# Patient Record
Sex: Female | Born: 1955 | Race: Black or African American | Hispanic: No | Marital: Single | Smoking: Former smoker
Health system: Southern US, Community
[De-identification: ages and names within clinical notes are randomized; demographics above are authoritative.]

## PROBLEM LIST (undated history)

## (undated) DIAGNOSIS — I639 Cerebral infarction, unspecified: Secondary | ICD-10-CM

## (undated) DIAGNOSIS — Z973 Presence of spectacles and contact lenses: Secondary | ICD-10-CM

## (undated) DIAGNOSIS — K439 Ventral hernia without obstruction or gangrene: Secondary | ICD-10-CM

## (undated) DIAGNOSIS — E7800 Pure hypercholesterolemia, unspecified: Secondary | ICD-10-CM

## (undated) DIAGNOSIS — J45909 Unspecified asthma, uncomplicated: Secondary | ICD-10-CM

## (undated) DIAGNOSIS — I10 Essential (primary) hypertension: Secondary | ICD-10-CM

## (undated) HISTORY — PX: TUBAL LIGATION: SHX77

## (undated) HISTORY — PX: KNEE SURGERY: SHX244

## (undated) HISTORY — PX: ABDOMINAL HYSTERECTOMY: SHX81

## (undated) HISTORY — PX: LEG SURGERY: SHX1003

## (undated) HISTORY — DX: Cerebral infarction, unspecified: I63.9

---

## 1999-01-22 ENCOUNTER — Emergency Department (HOSPITAL_COMMUNITY): Admission: EM | Admit: 1999-01-22 | Discharge: 1999-01-22 | Payer: Self-pay | Admitting: *Deleted

## 1999-02-18 ENCOUNTER — Emergency Department (HOSPITAL_COMMUNITY): Admission: EM | Admit: 1999-02-18 | Discharge: 1999-02-18 | Payer: Self-pay | Admitting: Emergency Medicine

## 1999-02-18 ENCOUNTER — Encounter: Payer: Self-pay | Admitting: Emergency Medicine

## 2000-08-24 ENCOUNTER — Emergency Department (HOSPITAL_COMMUNITY): Admission: EM | Admit: 2000-08-24 | Discharge: 2000-08-25 | Payer: Self-pay | Admitting: Emergency Medicine

## 2001-05-12 ENCOUNTER — Emergency Department (HOSPITAL_COMMUNITY): Admission: EM | Admit: 2001-05-12 | Discharge: 2001-05-12 | Payer: Self-pay

## 2001-05-12 ENCOUNTER — Encounter: Payer: Self-pay | Admitting: Specialist

## 2002-05-01 ENCOUNTER — Emergency Department (HOSPITAL_COMMUNITY): Admission: EM | Admit: 2002-05-01 | Discharge: 2002-05-01 | Payer: Self-pay | Admitting: Emergency Medicine

## 2002-05-01 ENCOUNTER — Encounter: Payer: Self-pay | Admitting: Emergency Medicine

## 2004-06-07 ENCOUNTER — Other Ambulatory Visit: Admission: RE | Admit: 2004-06-07 | Discharge: 2004-06-07 | Payer: Self-pay | Admitting: Family Medicine

## 2006-01-08 ENCOUNTER — Emergency Department (HOSPITAL_COMMUNITY): Admission: EM | Admit: 2006-01-08 | Discharge: 2006-01-08 | Payer: Self-pay | Admitting: Emergency Medicine

## 2006-12-09 ENCOUNTER — Emergency Department (HOSPITAL_COMMUNITY): Admission: EM | Admit: 2006-12-09 | Discharge: 2006-12-09 | Payer: Self-pay | Admitting: Emergency Medicine

## 2009-06-02 ENCOUNTER — Emergency Department (HOSPITAL_COMMUNITY): Admission: EM | Admit: 2009-06-02 | Discharge: 2009-06-02 | Payer: Self-pay | Admitting: Emergency Medicine

## 2009-06-02 IMAGING — CR DG KNEE COMPLETE 4+V*L*
4 series · 4 of 4 positions shown · non-contrast
Comparison: None

CLINICAL DATA: Fell - anterior knee pain

LEFT KNEE - COMPLETE 4+ VIEW

[t knee ap left]
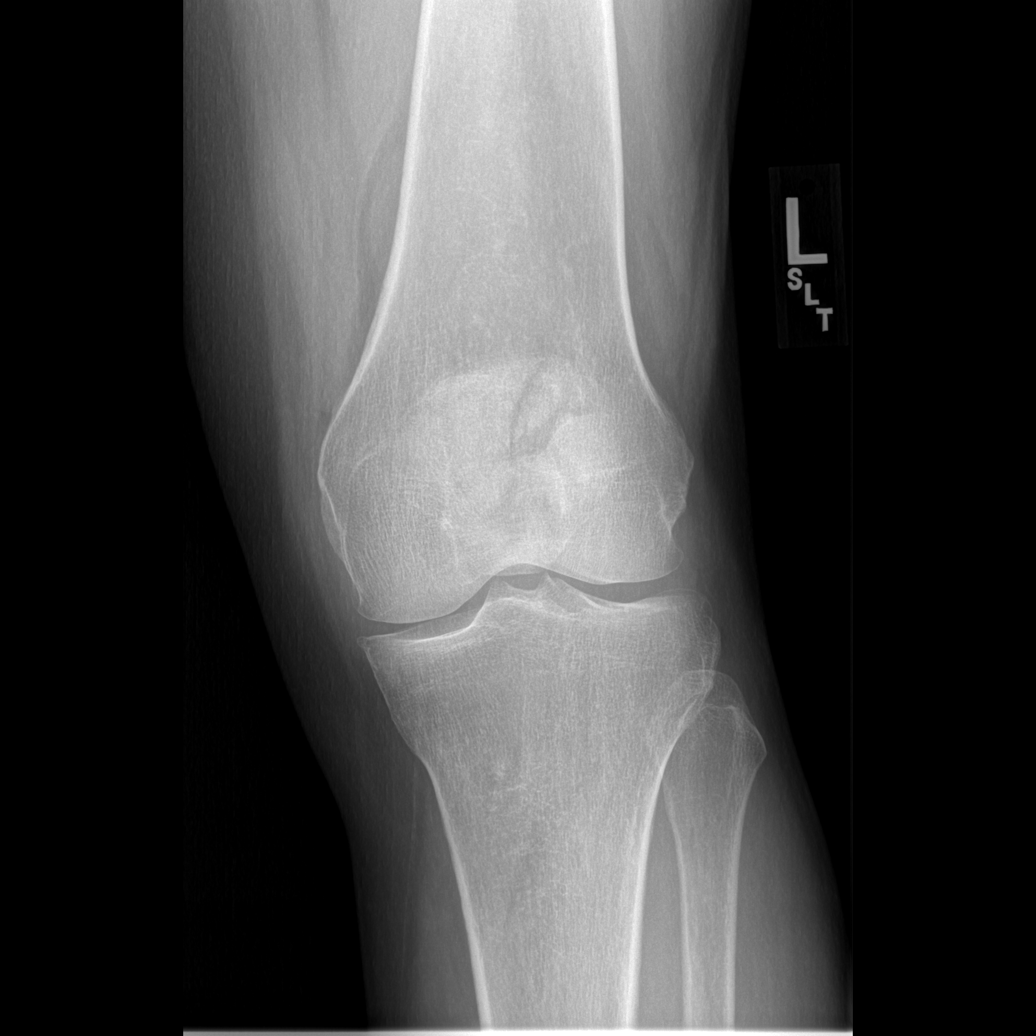

[t knee oblique left (1 of 2)]
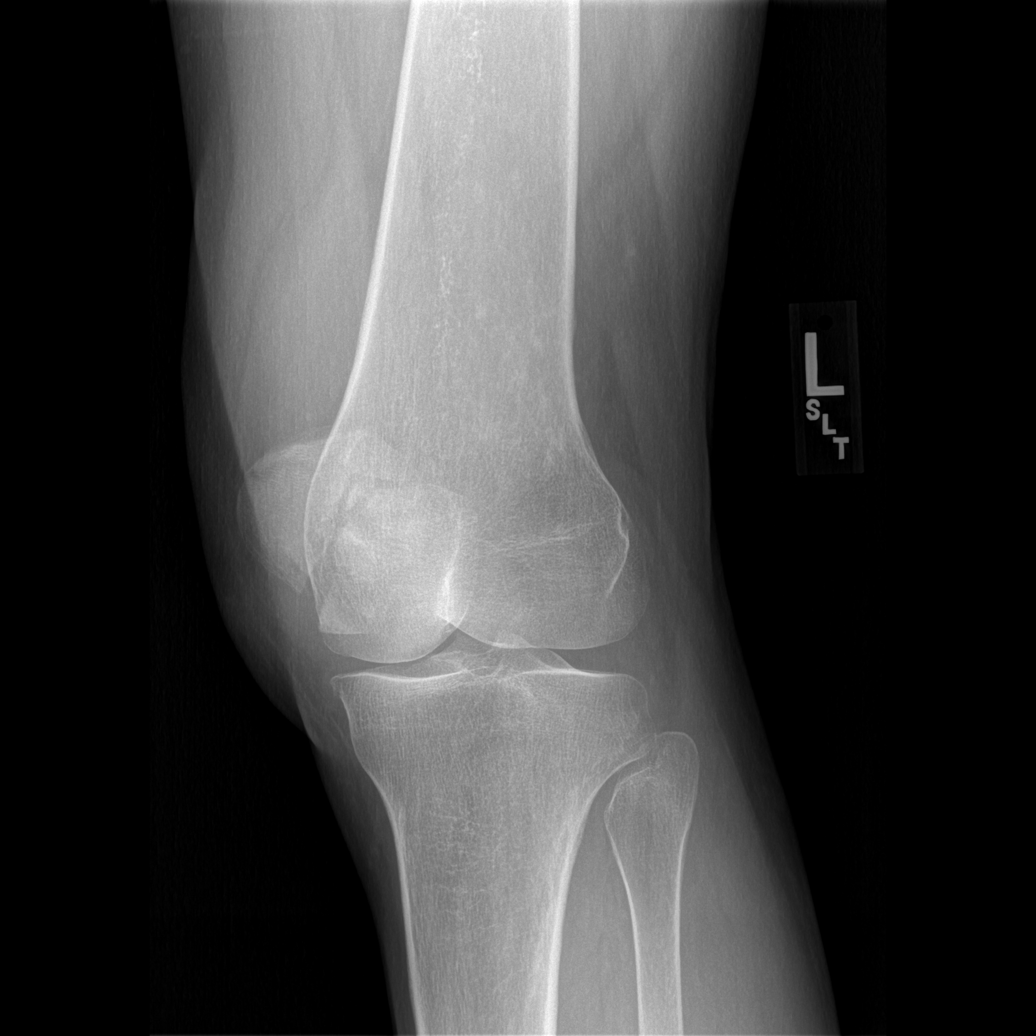

[t knee oblique left (2 of 2)]
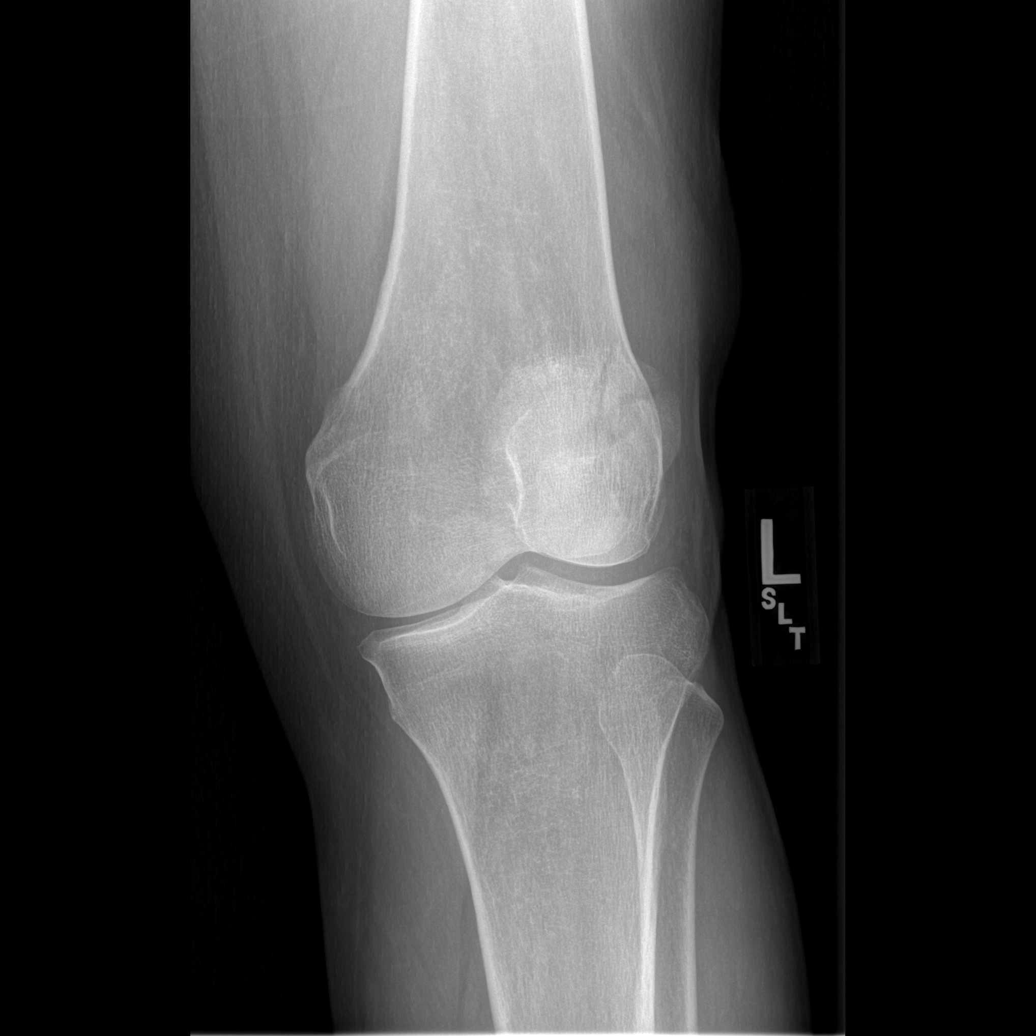

[view not recorded]
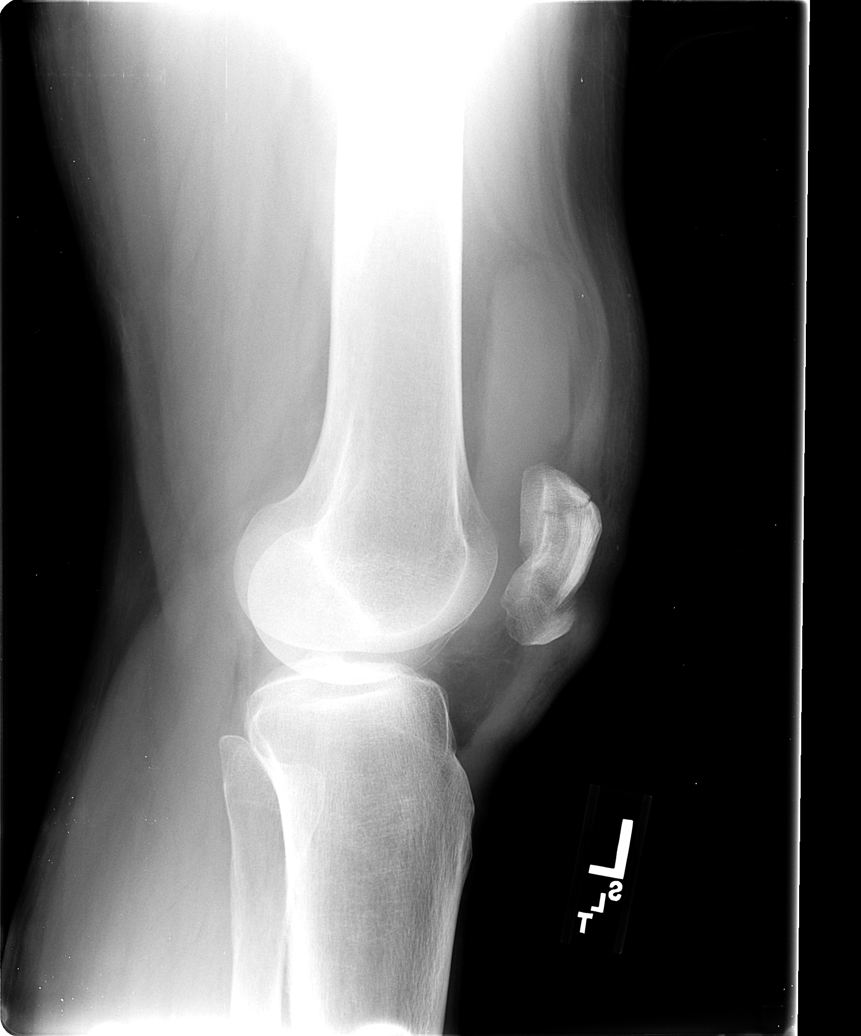

[4 of 4 positions shown; findings below may reference images not displayed]

FINDINGS: There is a comminuted fracture of the patella.  On the
cross-table lateral view, there is a large effusion with a fat
/fluid level.

Femur and tibia intact.  Fibula normal.  There are mild
degenerative changes.
IMPRESSION: Comminuted fracture of the patella with a large joint effusion
demonstrating a fat/fluid level.

## 2009-06-05 ENCOUNTER — Ambulatory Visit (HOSPITAL_COMMUNITY): Admission: RE | Admit: 2009-06-05 | Discharge: 2009-06-05 | Payer: Self-pay | Admitting: Orthopaedic Surgery

## 2009-12-02 ENCOUNTER — Observation Stay (HOSPITAL_COMMUNITY)
Admission: EM | Admit: 2009-12-02 | Discharge: 2009-12-03 | Payer: Self-pay | Source: Home / Self Care | Admitting: Emergency Medicine

## 2009-12-02 ENCOUNTER — Ambulatory Visit: Payer: Self-pay | Admitting: Cardiology

## 2009-12-03 ENCOUNTER — Encounter (INDEPENDENT_AMBULATORY_CARE_PROVIDER_SITE_OTHER): Payer: Self-pay | Admitting: *Deleted

## 2009-12-03 IMAGING — CR DG CHEST 2V
2 series · 2 of 2 positions shown · non-contrast
Comparison: Report of remote study [DATE] is reviewed.  Images
have been purged.

CLINICAL DATA: Dizziness, nausea, chest pain

CHEST - 2 VIEW

[w chest pa]
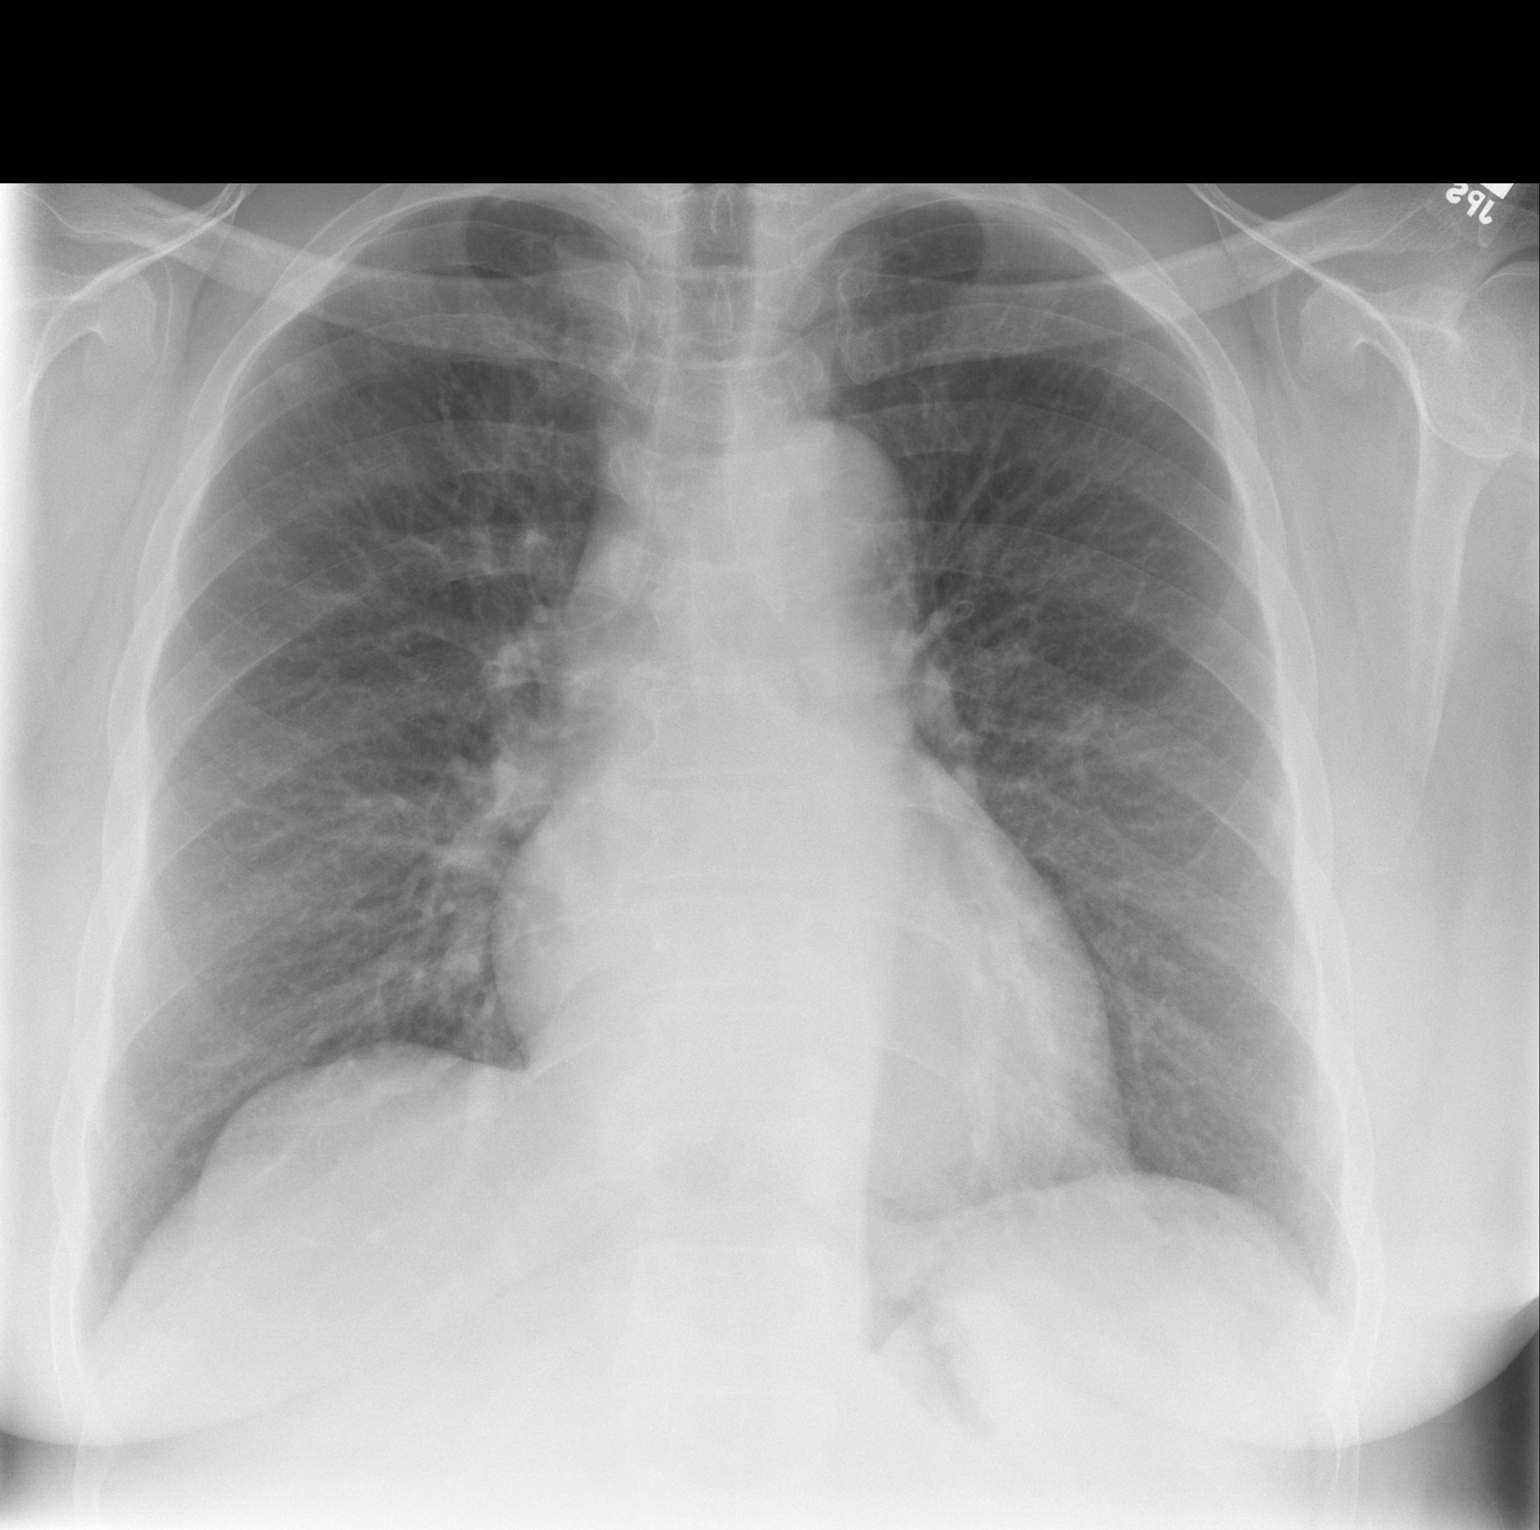

[w chest lat]
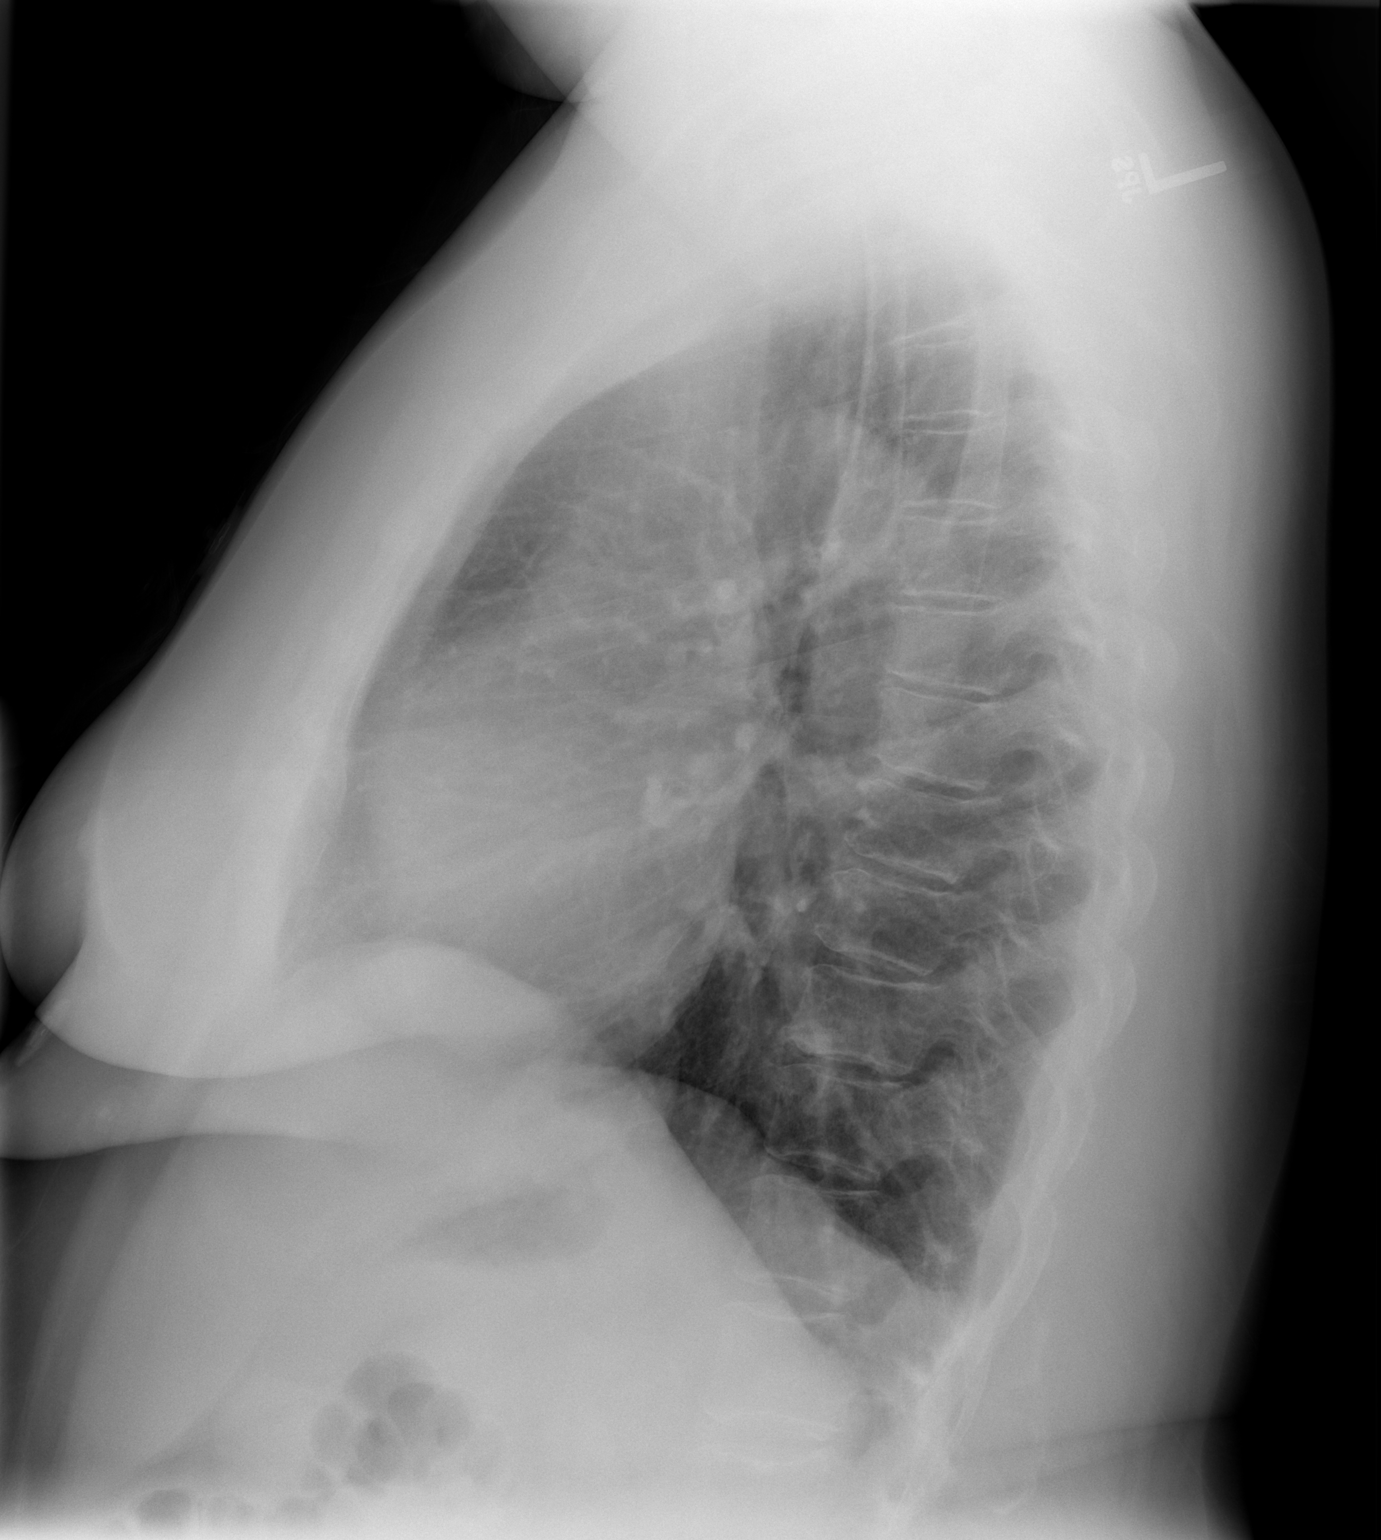

[2 of 2 positions shown; findings below may reference images not displayed]

FINDINGS: Heart size upper limits of normal.  Lungs are clear.
Rounded opacities over the upper lobes bilaterally are compatible
with cardiac leads or something on the patient's clothing.  No
pleural effusion.  No acute osseous abnormality.
IMPRESSION: Borderline cardiomegaly, no focal acute finding.

## 2010-02-06 HISTORY — PX: COLONOSCOPY: SHX174

## 2010-04-20 LAB — URINE CULTURE
Colony Count: 40000
Culture  Setup Time: 201110272238

## 2010-04-20 LAB — POCT CARDIAC MARKERS
Myoglobin, poc: 111 ng/mL (ref 12–200)
Troponin i, poc: <0.05 ng/mL (ref 0.00–0.09)
Troponin i, poc: <0.05 ng/mL (ref 0.00–0.09)

## 2010-04-20 LAB — CK TOTAL AND CKMB (NOT AT ARMC)
CK, MB: 3.3 ng/mL (ref 0.3–4.0)
Relative Index: 1 (ref 0.0–2.5)
Total CK: 317 U/L — ABNORMAL HIGH (ref 7–177)
Total CK: 324 U/L — ABNORMAL HIGH (ref 7–177)

## 2010-04-20 LAB — COMPREHENSIVE METABOLIC PANEL
ALT: 28 U/L (ref 0–35)
Alkaline Phosphatase: 89 U/L (ref 39–117)
CO2: 26 mEq/L (ref 19–32)
Chloride: 104 mEq/L (ref 96–112)
GFR calc non Af Amer: >60 mL/min (ref >60)
Glucose, Bld: 113 mg/dL — ABNORMAL HIGH (ref 70–99)
Potassium: 3.6 mEq/L (ref 3.5–5.1)
Sodium: 137 mEq/L (ref 135–145)
Total Bilirubin: 0.4 mg/dL (ref 0.3–1.2)
Total Protein: 7.3 g/dL (ref 6.0–8.3)

## 2010-04-20 LAB — DIFFERENTIAL
Basophils Absolute: 0.1 10*3/uL (ref 0.0–0.1)
Basophils Relative: 1 % (ref 0–1)
Eosinophils Absolute: 0.2 10*3/uL (ref 0.0–0.7)
Monocytes Relative: 8 % (ref 3–12)
Neutrophils Relative %: 62 % (ref 43–77)

## 2010-04-20 LAB — URINALYSIS, ROUTINE W REFLEX MICROSCOPIC
Hgb urine dipstick: NEGATIVE
Protein, ur: 30 mg/dL — AB
Urobilinogen, UA: 0.2 mg/dL (ref 0.0–1.0)

## 2010-04-20 LAB — TROPONIN I: Troponin I: <0.01 ng/mL (ref 0.00–0.06)

## 2010-04-20 LAB — URINE MICROSCOPIC-ADD ON

## 2010-04-20 LAB — GLUCOSE, CAPILLARY: Glucose-Capillary: 137 mg/dL — ABNORMAL HIGH (ref 70–99)

## 2010-04-20 LAB — CBC
HCT: 38.1 % (ref 36.0–46.0)
Hemoglobin: 12.6 g/dL (ref 12.0–15.0)
MCV: 84.9 fL (ref 78.0–100.0)
RBC: 4.49 MIL/uL (ref 3.87–5.11)
WBC: 6.9 10*3/uL (ref 4.0–10.5)

## 2010-04-20 LAB — HEMOCCULT GUIAC POC 1CARD (OFFICE): Fecal Occult Bld: NEGATIVE

## 2010-04-26 LAB — BASIC METABOLIC PANEL
BUN: 9 mg/dL (ref 6–23)
GFR calc non Af Amer: >60 mL/min (ref >60)
Potassium: 4.1 mEq/L (ref 3.5–5.1)
Sodium: 138 mEq/L (ref 135–145)

## 2010-04-26 LAB — CBC
HCT: 39.1 % (ref 36.0–46.0)
Hemoglobin: 13.2 g/dL (ref 12.0–15.0)
Platelets: 281 10*3/uL (ref 150–400)
WBC: 8.7 10*3/uL (ref 4.0–10.5)

## 2010-04-26 LAB — GLUCOSE, CAPILLARY

## 2010-06-24 NOTE — Consult Note (Signed)
[place]  Patient:    GAILA, ENGEBRETSEN Visit Number: [id number] MRN: [id number]          Service Type: EMS Location: [facility] [place] Attending Physician:  Armanda Heritage Dictated by:   R. Valma Cava, M.D. Proc. Date: 05/12/01 Admit Date:  05/12/2001                            Consultation Report  HISTORY OF PRESENT ILLNESS:  Ms. Treu is a 55 year old female who dropped a coffee table today and the glass lacerated the left inferior aspect of her leg above the ankle.  She was noted to have large blood loss at the scene.  Was brought to [facility] Emergency Room via EMS.  There she was noted to have large bleeding and a pneumatic tourniquet was applied by Dr. Tamera Punt.  Dr. Arbie Cookey was consulted secondary to possible vascular injury.  Dr. Arbie Cookey explored the area in the emergency room, found that she had lacerated the anterior tibial artery and veins, but felt that her circulation was satisfactory despite that and he tied these vessels off in the emergency room.  He then called, I believe, Dr. Shon Hough and then Dr. Teressa Senter concerning potential nerve damage.  They deferred treatment.  I was on orthopedic call and was asked to see the patient.  I evaluated the patient in the emergency room.  She is accompanied by her daughter.  She was sleepy with oxygen.  She had been sedated by the physicians prior to me.  She was awake and could follow commands and was oriented to person, place, time, and circumstance.  PHYSICAL EXAMINATION  VITAL SIGNS:  Pulse 108, blood pressure 110/70.  Initial hemoglobin 10.5, last hemoglobin 9.5.  EXTREMITIES:  The leg was examined.  She had a triangular laceration approximately 12-15 cm above the ankle joint on the anterior aspect of the leg.  The overlying skin was of viable quality.  The area was opened and evaluated.  It had been thoroughly previously cleansed by Dr. Arbie Cookey.  The vascular bundle had been tied off and was  pulsing, but was not bleeding. There were multiple tendons exposed and lacerated tibialis anterior, extensor digitorum longus, and probable extensor ______ longus.  Examination distally revealed an insensate foot on the dorsal aspect.  She had decreased sensation in the deep peroneal nerve distribution and the medial cutaneous branch of the superficial peroneal nerve.  Spiculation of the toes. She had good capillary refill.  LABORATORIES:  Plain x-rays were negative for foreign body or fracture.  IMPRESSION:  Deep extensive laceration of the left anterior leg, vascular status as above, vessels tied off by Dr. Arbie Cookey of vascular surgery.  At this time the patient shows evidence of multiple tendon damage, but more importantly she shows evidence of at least, I believe, two significant sensory peripheral nerves that are involved.  She is unable to dorsiflex the foot and toes.  I believe that is secondary to the tendon damage.  I personally do not perform peripheral nerve repairs.  I have discussed this with Dr. Teressa Senter who is on-call for hand surgery tonight.  He prefers not to get involved with peripheral nerve surgery of the legs.  With that in mind, I basically do not have the clinical ability to repair this ladys structures that need repairing and therefore I called [facility].  ______ Dr. Elliot Dally, orthopedic surgery and he has graciously accepted  the patient.  He is asked to have her transported by [facility] to the emergency room where she will be evaluated by the ER staff at [facility] and he will address her orthopedic problems.  I have also noted that her last hemoglobin was 9.5.  I would recommend that she be watched closely and then repeat the hemoglobin and hematocrit at [facility] and treat appropriately.  I discussed this with the patient and her daughter.  They understand the necessity for the transfer to  [facility] and wish to proceed.  Again, I do not perform peripheral nerve repairs.  I do not have the surgical expertise to perform this and need assistance with this and at this point in time I cannot find anyone available to help me with this repair.  Therefore, the decision was made to transfer to [facility] for appropriate attention.  Also discussed this with Dr. Tamera Punt and he is agreement. Dictated by:   R. Valma Cava, M.D. Attending Physician:  Armanda Heritage DD:  05/12/01 TD:  05/13/01 Job: [id number]

## 2010-06-24 NOTE — Consult Note (Signed)
[place]  Patient:    Megan Lane, Megan Lane Visit Number: [id number] MRN: [id number]          Service Type: EMS Location: [facility] [place] Attending Physician:  Armanda Heritage Dictated by:   Larina Earthly, M.D. Proc. Date: 05/12/01 Admit Date:  05/12/2001                            Consultation Report  HISTORY OF PRESENT ILLNESS:  The patient is a 55 year old black female who, on the evening of this evaluation, had a glass coffee table break and cause a severe laceration of her distal left ankle and distal leg above the ankle. Apparently, there was large loss of blood at the scene and the EMS reported a large blood loss at the scene.  The patient was brought to the [facility] Emergency Room and on examination was found arterial bleeding that was uncontrolled by local pressure.  The ER physician inflated a pneumatic tourniquet on the distal calf.  I was called to see her and when I first examined her, the tourniquet had been inflated for 18 minutes.  The patients blood pressure was 100/60.  Her heart rate was 110.  She was somewhat sedated and apparently had received a large dose of Demerol. Initially, she was very anxious and had a heart rate in the 160s.  Her HemoCue hematocrit was 30.  Her eventual CBC revealed a hemoglobin of 10 and hematocrit of 30.  DESCRIPTION OF PROCEDURE:  In the emergency room, I irrigated this area with Betadine and saline while the tourniquet was inflated and there was no bleeding.  The patient had a degloving-type injury just above the ankle extending down to the ankle and had sheared into the neurovascular bundle. The artery was visualized and had retracted and there was significant distance between the ends of the arteries; the nerve was also visualized and transected proximally.  I did not see the nerve distally.  The anterior tibial artery and veins were occluded with hemostats and the tourniquet was deflated after a total of  28 minutes.  There was no bleeding after the tourniquet was deflated. The patient had brisk biphasic posterior tibial Doppler signal and had actually a good dorsalis pedis Doppler signal with the anterior tibial occluded.  There was audible flow in all toes.  The anterior tibial artery and vein were individually ligated proximally and distally with 3-0 silk ties. The wound was copiously irrigated and a Betadine dressing was placed.  I discussed the patient with Dr. Benny Lennert, due to the soft tissue and the nerve injury.  He will see her for further evaluation.  The dressing was placed.  ASSESSMENT AND PLAN:  She is okay for discharge from my standpoint.  I discussed this at great length with the family present, who understands the concept of collateral flow into her foot.  The disposition will be by Dr. Hayden Rasmussen.  She did have a gram of Ancef in the emergency room.  Blood pressure remained stable at 90 to 100 systolic. Dictated by:   Larina Earthly, M.D. Attending Physician:  Armanda Heritage DD:  05/12/01 TD:  05/13/01 Job: [id number]

## 2011-10-31 ENCOUNTER — Encounter (HOSPITAL_COMMUNITY): Payer: Self-pay | Admitting: Emergency Medicine

## 2011-10-31 ENCOUNTER — Emergency Department (INDEPENDENT_AMBULATORY_CARE_PROVIDER_SITE_OTHER): Payer: BC Managed Care – PPO

## 2011-10-31 ENCOUNTER — Emergency Department (HOSPITAL_COMMUNITY)
Admission: EM | Admit: 2011-10-31 | Discharge: 2011-10-31 | Disposition: A | Discharge status: Home / Self Care | Payer: BC Managed Care – PPO | Source: Home / Self Care

## 2011-10-31 DIAGNOSIS — M25462 Effusion, left knee: Secondary | ICD-10-CM

## 2011-10-31 DIAGNOSIS — M2390 Unspecified internal derangement of unspecified knee: Secondary | ICD-10-CM

## 2011-10-31 DIAGNOSIS — M25469 Effusion, unspecified knee: Secondary | ICD-10-CM

## 2011-10-31 HISTORY — DX: Essential (primary) hypertension: I10

## 2011-10-31 IMAGING — CR DG KNEE COMPLETE 4+V*L*
4 series · 4 of 4 positions shown · non-contrast
Comparison: [DATE].

CLINICAL DATA: 55-year-old female fall landing on left knee.

LEFT KNEE - COMPLETE 4+ VIEW

[view not recorded (1 of 4)]
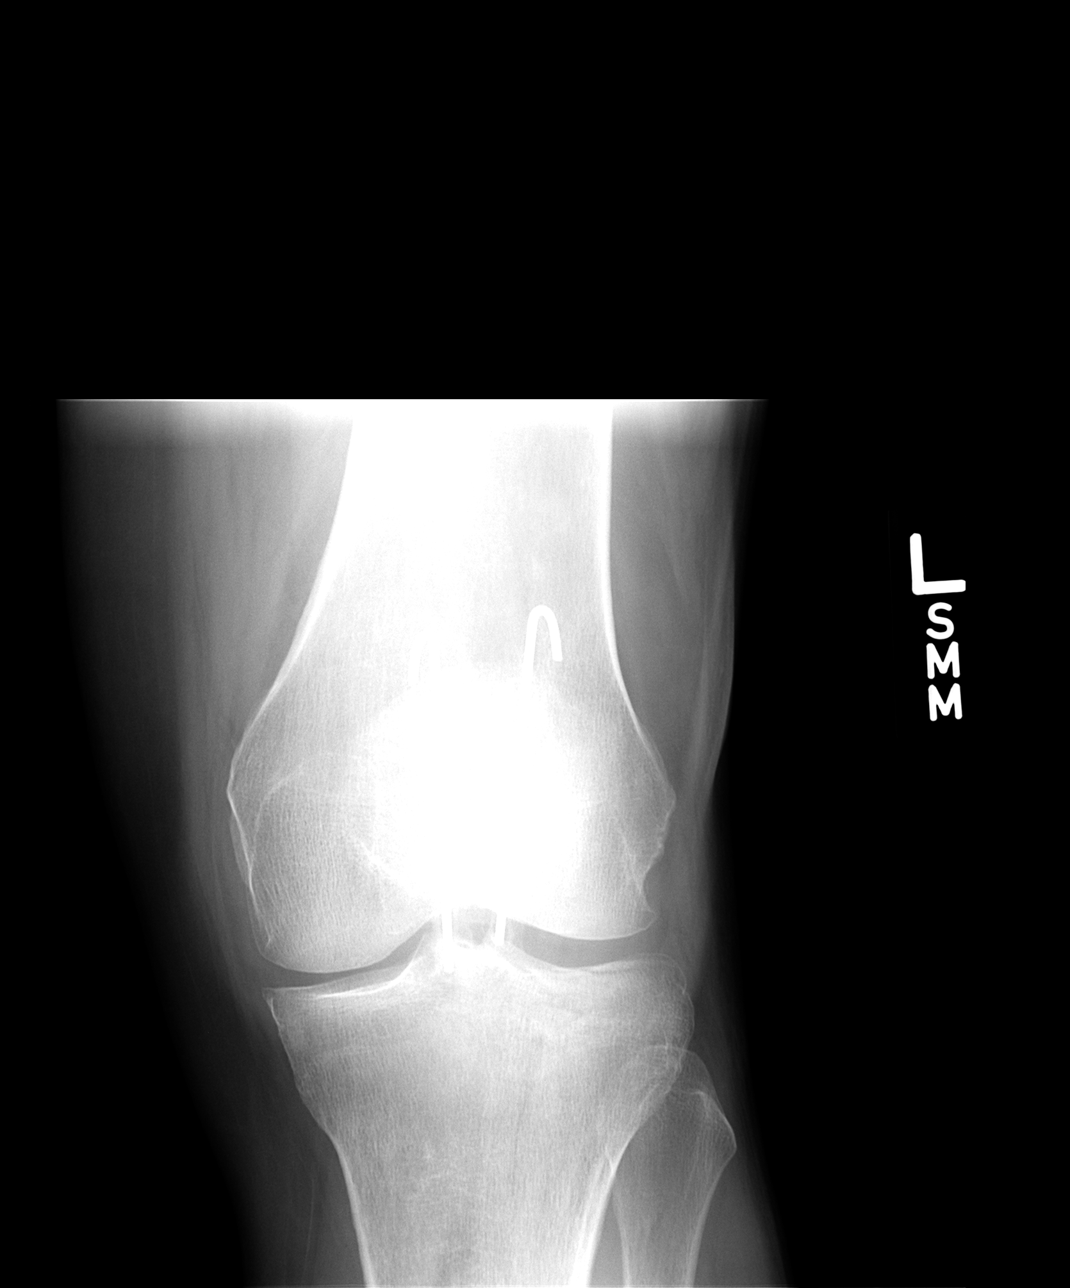

[view not recorded (2 of 4)]
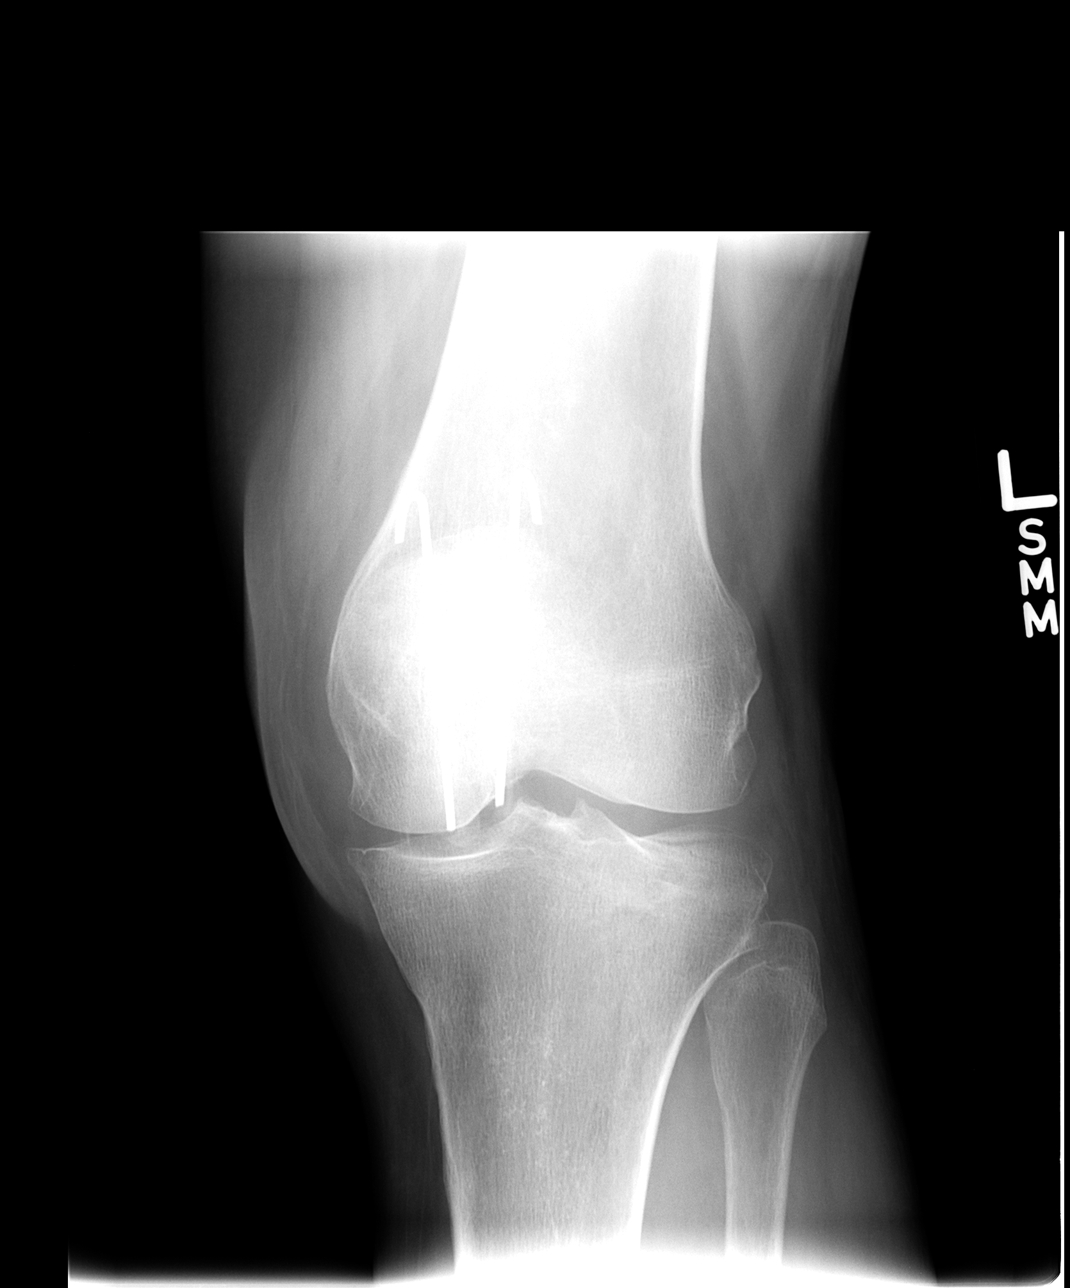

[view not recorded (3 of 4)]
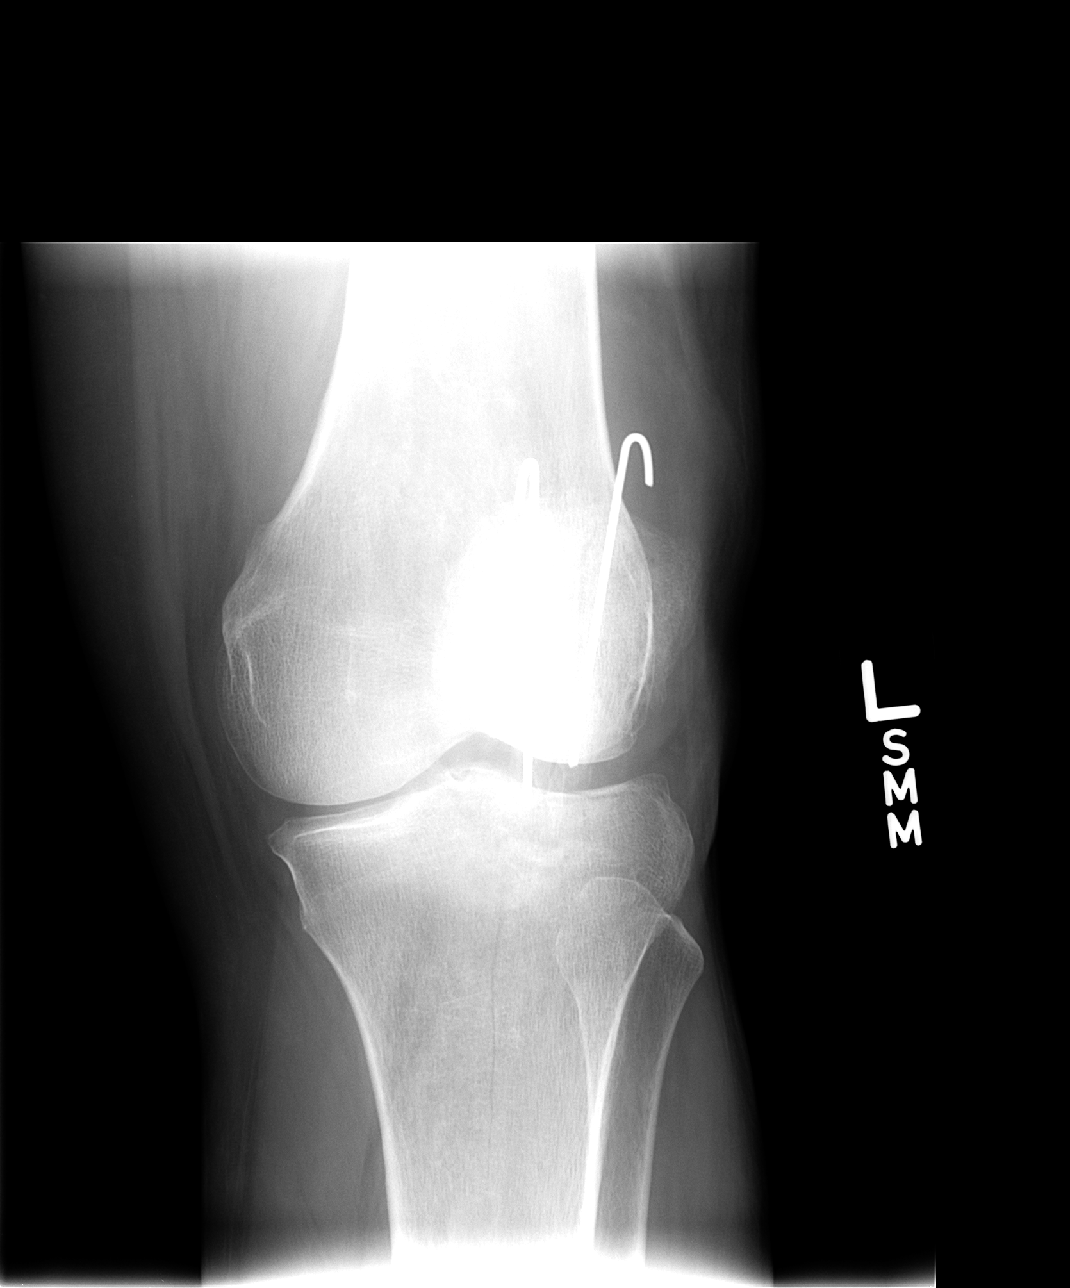

[view not recorded (4 of 4)]
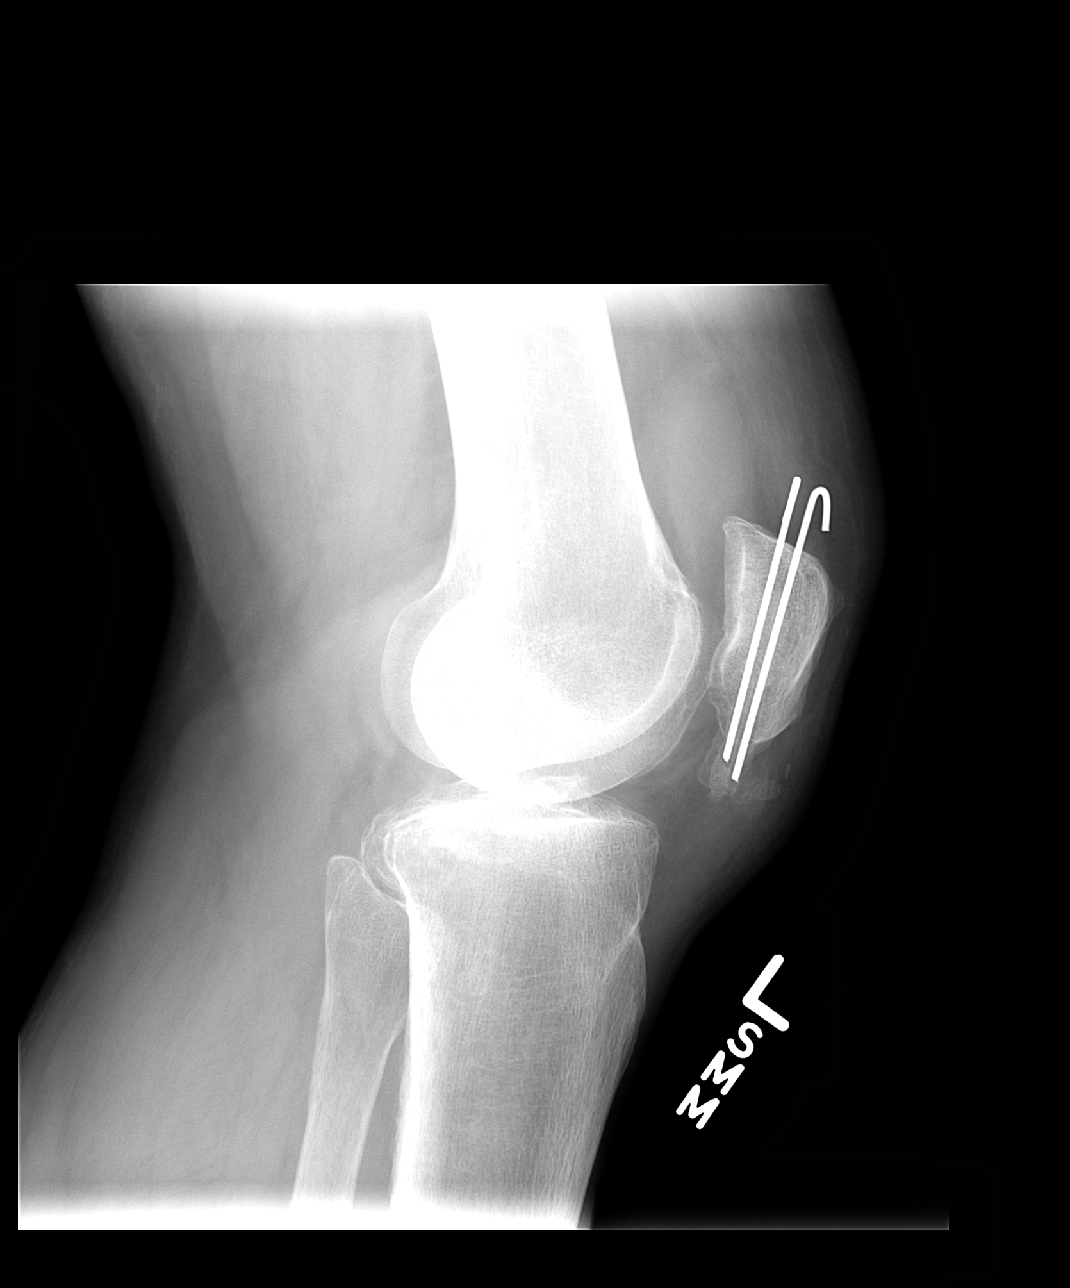

[4 of 4 positions shown; findings below may reference images not displayed]

FINDINGS: Previous K-wire fixation of the comminuted patellar
fracture seen into [9N].  Moderate to large hyperdense joint
effusion.  Hardware appears intact.  No acute fracture or
dislocation identified.
IMPRESSION: 1.  Moderate volume hemarthrosis/joint effusion suspected.
2.  No acute fracture dislocation.  Previous K-wire fixation of the
patella appears intact.

## 2011-10-31 MED ORDER — HYDROCODONE-ACETAMINOPHEN 5-325 MG PO TABS: 1.0000 | ORAL_TABLET | ORAL | Status: DC | PRN

## 2011-10-31 NOTE — ED Provider Notes (Signed)
History     CSN: [id number]  Arrival date & time 10/31/11  1736   None     Chief Complaint  Patient presents with  . Fall    (Consider location/radiation/quality/duration/timing/severity/associated sxs/prior treatment) HPI Comments: 56 year old female was at work and she expected mechanical fall and twisted her left knee. She is complaining of moderate to severe left knee pain. She denies striking her left knee however she states she twisted the knee. It has since developed marked swelling and extremely tender. Ambulation is difficult. Denies other injury  Patient is a 56 y.o. female presenting with fall.  Fall The accident occurred 1 to 2 hours ago. The fall occurred in unknown circumstances. She fell from a height of 3 to 5 ft. She landed on a hard floor. The pain is present in the left knee. The pain is at a severity of 5/10. The pain is moderate. She was ambulatory at the scene. Pertinent negatives include no visual change, no fever, no numbness, no bowel incontinence, no vomiting, no headaches and no tingling. The symptoms are aggravated by activity, standing, ambulation, rotation, use of the injured limb and pressure on the injury. She has tried nothing for the symptoms.    Past Medical History  Diagnosis Date  . Hypertension   . Diabetes mellitus     Past Surgical History  Procedure Date  . Knee surgery   . Abdominal hysterectomy     History reviewed. No pertinent family history.  History  Substance Use Topics  . Smoking status: Never Smoker   . Smokeless tobacco: Not on file  . Alcohol Use: No    OB History    Grav Para Term Preterm Abortions TAB SAB Ect Mult Living                  Review of Systems  Constitutional: Negative for fever, chills and activity change.  HENT: Negative.   Respiratory: Negative.   Cardiovascular: Negative.   Gastrointestinal: Negative for vomiting and bowel incontinence.  Musculoskeletal:       As per HPI  Skin: Negative for  color change, pallor and rash.  Neurological: Negative.  Negative for tingling, numbness and headaches.    Allergies  Penicillins  Home Medications   Current Outpatient Rx  Name Route Sig Dispense Refill  . METFORMIN HCL 500 MG PO TABS Oral Take 500 mg by mouth 2 (two) times daily with a meal.    . HYDROCODONE-ACETAMINOPHEN 5-325 MG PO TABS Oral Take 1 tablet by mouth every 4 (four) hours as needed for pain. 20 tablet 0  . LISINOPRIL PO Oral Take by mouth.    . CRESTOR PO Oral Take by mouth.      BP 148/85  Pulse 94  Temp 98.3 F (36.8 C) (Oral)  Resp 18  SpO2 100%  Physical Exam  Nursing note and vitals reviewed. Constitutional: She is oriented to person, place, and time. She appears well-developed and well-nourished. No distress.  Neck: Normal range of motion. Neck supple.  Musculoskeletal:       Edema surrounding the anterior medial and lateral aspect of the left knee. There is tenderness in these areas as well. Attempt to flex the leg or perform varus valgus or drawer test produces apprehension and severe pain. These tests were not revealing as I was unable to complete them due to her pain. Flexion is limited to 10. Pulse leg in full extension  Neurological: She is alert and oriented to person, place, and time.  Skin: Skin is warm and dry.    ED Course  Procedures (including critical care time)  Labs Reviewed - No data to display Dg Knee Complete 4 Views Left  10/31/2011  *RADIOLOGY REPORT*  Clinical Data: 56 year old female fall landing on left knee.  LEFT KNEE - COMPLETE 4+ VIEW  Comparison: 06/02/2009.  Findings: Previous K-wire fixation of the comminuted patellar fracture seen into 1011.  Moderate to large hyperdense joint effusion.  Hardware appears intact.  No acute fracture or dislocation identified.  IMPRESSION: 1.  Moderate volume hemarthrosis/joint effusion suspected. 2.  No acute fracture dislocation.  Previous K-wire fixation of the patella appears intact.    Original Report Authenticated By: Ulla Potash III, M.D.      1. Knee effusion, left   2. Internal derangement of knee       MDM  Plan knee immobilizer to the left leg. Use crutches with no weightbearing. Will call Dr. Orlando Penner tomorrow for an appointment she is already established with him. Ice off and on for the next couple of days. Norco 5 mg q. 4 hours when necessary pain.        Hayden Rasmussen, NP 10/31/11 1952

## 2011-10-31 NOTE — ED Notes (Signed)
Pt states that while at work she was standing on a ladder that slid from under her twisting her left knee. Pt has pain with weight bearing. History of left knee surgery in 2012.

## 2011-11-02 NOTE — ED Provider Notes (Signed)
Medical screening examination/treatment/procedure(s) were performed by resident physician or non-physician practitioner and as supervising physician I was immediately available for consultation/collaboration.   Socrates Cahoon DOUGLAS MD.    Mikael Skoda D Edra Riccardi, MD 11/02/11 2049 

## 2012-08-31 ENCOUNTER — Encounter (HOSPITAL_COMMUNITY): Payer: Self-pay | Admitting: Emergency Medicine

## 2012-08-31 ENCOUNTER — Emergency Department (HOSPITAL_COMMUNITY)
Admission: EM | Admit: 2012-08-31 | Discharge: 2012-08-31 | Disposition: A | Discharge status: Home / Self Care | Payer: Self-pay | Attending: Emergency Medicine | Admitting: Emergency Medicine

## 2012-08-31 DIAGNOSIS — M2550 Pain in unspecified joint: Secondary | ICD-10-CM | POA: Insufficient documentation

## 2012-08-31 DIAGNOSIS — R110 Nausea: Secondary | ICD-10-CM | POA: Insufficient documentation

## 2012-08-31 DIAGNOSIS — R269 Unspecified abnormalities of gait and mobility: Secondary | ICD-10-CM | POA: Insufficient documentation

## 2012-08-31 DIAGNOSIS — I10 Essential (primary) hypertension: Secondary | ICD-10-CM | POA: Insufficient documentation

## 2012-08-31 DIAGNOSIS — M545 Low back pain, unspecified: Secondary | ICD-10-CM | POA: Insufficient documentation

## 2012-08-31 DIAGNOSIS — Z880 Allergy status to penicillin: Secondary | ICD-10-CM | POA: Insufficient documentation

## 2012-08-31 DIAGNOSIS — Z96659 Presence of unspecified artificial knee joint: Secondary | ICD-10-CM | POA: Insufficient documentation

## 2012-08-31 DIAGNOSIS — Z79899 Other long term (current) drug therapy: Secondary | ICD-10-CM | POA: Insufficient documentation

## 2012-08-31 DIAGNOSIS — E119 Type 2 diabetes mellitus without complications: Secondary | ICD-10-CM | POA: Insufficient documentation

## 2012-08-31 DIAGNOSIS — Z90710 Acquired absence of both cervix and uterus: Secondary | ICD-10-CM | POA: Insufficient documentation

## 2012-08-31 DIAGNOSIS — M549 Dorsalgia, unspecified: Secondary | ICD-10-CM

## 2012-08-31 LAB — URINALYSIS, ROUTINE W REFLEX MICROSCOPIC
Bilirubin Urine: NEGATIVE
Hgb urine dipstick: NEGATIVE
Ketones, ur: NEGATIVE mg/dL
Protein, ur: NEGATIVE mg/dL
Specific Gravity, Urine: 1.014 (ref 1.005–1.030)
Urobilinogen, UA: 0.2 mg/dL (ref 0.0–1.0)

## 2012-08-31 MED ORDER — OXYCODONE-ACETAMINOPHEN 5-325 MG PO TABS: 1.0000 | ORAL_TABLET | ORAL | Status: DC | PRN

## 2012-08-31 MED ORDER — ONDANSETRON 8 MG PO TBDP
8.0000 mg | ORAL_TABLET | Freq: Once | ORAL | Status: AC
  Administered 2012-08-31: 8 mg via ORAL
  Filled 2012-08-31: qty 1

## 2012-08-31 MED ORDER — PROMETHAZINE HCL 25 MG PO TABS: 25.0000 mg | ORAL_TABLET | Freq: Four times a day (QID) | ORAL | Status: DC | PRN

## 2012-08-31 MED ORDER — OXYCODONE-ACETAMINOPHEN 5-325 MG PO TABS
2.0000 | ORAL_TABLET | Freq: Once | ORAL | Status: AC
  Administered 2012-08-31: 2 via ORAL
  Filled 2012-08-31: qty 2

## 2012-08-31 NOTE — ED Notes (Signed)
Pt reports low back pain x 4 days. Denies recent trauma.  Nausea x1 day

## 2012-08-31 NOTE — ED Provider Notes (Signed)
CSN: [id number]     Arrival date & time 08/31/12  1113 History     First MD Initiated Contact with Patient 08/31/12 1142     Chief Complaint  Patient presents with  . Back Pain    low back pain x 4 days. Denies trauma   (Consider location/radiation/quality/duration/timing/severity/associated sxs/prior Treatment) HPI Comments: Patient is a 57 year old female who presents today with low back pain since Wednesday (4 days ago). She was standing in her kitchen when it began and it has gradually worsened since that time. The pain is achy all the time. It is sharp when she moves the wrong way. There is nothing that makes her pain feel better other than sitting still. She has tried ibuprofen with no relief. The pain is on the right side of her back and does not radiate. Today she became nauseous. History of knee replacement on right side in September. No issues since then. She denies bowel or bladder incontinence, IV drug abuse, history of cancer. No fevers, chills, vomiting, abdominal pain, urinary urgency, urinary frequency, dysuria, numbness, weakness, paresthesias.  The history is provided by the patient. No language interpreter was used.    Past Medical History  Diagnosis Date  . Hypertension   . Diabetes mellitus    Past Surgical History  Procedure Laterality Date  . Knee surgery    . Abdominal hysterectomy     Family History  Problem Relation Age of Onset  . Diabetes Mother   . Hypertension Mother    History  Substance Use Topics  . Smoking status: Never Smoker   . Smokeless tobacco: Not on file  . Alcohol Use: No   OB History   Grav Para Term Preterm Abortions TAB SAB Ect Mult Living                 Review of Systems  Constitutional: Negative for fever and chills.  Gastrointestinal: Positive for nausea. Negative for vomiting and abdominal pain.  Musculoskeletal: Positive for back pain, arthralgias and gait problem (antalgic).  All other systems reviewed and are  negative.    Allergies  Penicillins  Home Medications   Current Outpatient Rx  Name  Route  Sig  Dispense  Refill  . ibuprofen (ADVIL,MOTRIN) 200 MG tablet   Oral   Take 400 mg by mouth every 6 (six) hours as needed for pain.         Marland Kitchen lisinopril (PRINIVIL,ZESTRIL) 20 MG tablet   Oral   Take 20 mg by mouth daily.         . metFORMIN (GLUCOPHAGE) 500 MG tablet   Oral   Take 500 mg by mouth 2 (two) times daily with a meal.          BP 166/87  Pulse 85  Temp(Src) 98.1 F (36.7 C) (Oral)  Resp 16  SpO2 96% Physical Exam  Nursing note and vitals reviewed. Constitutional: She is oriented to person, place, and time. She appears well-developed and well-nourished. No distress.  HENT:  Head: Normocephalic and atraumatic.  Right Ear: External ear normal.  Left Ear: External ear normal.  Nose: Nose normal.  Mouth/Throat: Oropharynx is clear and moist.  Eyes: Conjunctivae are normal.  Neck: Normal range of motion.  Cardiovascular: Normal rate, regular rhythm, normal heart sounds, intact distal pulses and normal pulses.   Pulmonary/Chest: Effort normal and breath sounds normal. No stridor. No respiratory distress. She has no wheezes. She has no rales.  Abdominal: Soft. She exhibits no distension.  Musculoskeletal:  Normal range of motion.       Lumbar back: She exhibits tenderness. She exhibits no bony tenderness.       Back:  No deformity or step offs  Neurological: She is alert and oriented to person, place, and time. She has normal strength. No sensory deficit. Gait (antalgic) abnormal. Coordination normal.  Skin: Skin is warm and dry. She is not diaphoretic. No erythema.  Psychiatric: She has a normal mood and affect. Her behavior is normal.    ED Course   Procedures (including critical care time)  Labs Reviewed  URINALYSIS, ROUTINE W REFLEX MICROSCOPIC   No results found. 1. Back pain     MDM  Patient with back pain.  No neurological deficits and normal  neuro exam.  Patient can walk but states is painful.  No loss of bowel or bladder control.  No concern for cauda equina.  No fever, night sweats, weight loss, h/o cancer, IVDU.  RICE protocol and pain medicine indicated and discussed with patient.    Mora Bellman, PA-C 08/31/12 1235

## 2012-09-01 NOTE — ED Provider Notes (Signed)
Medical screening examination/treatment/procedure(s) were performed by non-physician practitioner and as supervising physician I was immediately available for consultation/collaboration.   Kryssa Risenhoover M Kamrie Fanton, MD 09/01/12 0711 

## 2013-03-14 ENCOUNTER — Ambulatory Visit: Payer: Self-pay | Attending: Internal Medicine

## 2013-03-18 ENCOUNTER — Encounter: Payer: Self-pay | Admitting: Internal Medicine

## 2013-03-18 ENCOUNTER — Telehealth: Payer: Self-pay | Admitting: *Deleted

## 2013-03-18 ENCOUNTER — Ambulatory Visit (HOSPITAL_COMMUNITY)
Admission: RE | Admit: 2013-03-18 | Discharge: 2013-03-18 | Disposition: A | Discharge status: Home / Self Care | Payer: Self-pay | Source: Ambulatory Visit | Attending: Internal Medicine | Admitting: Internal Medicine

## 2013-03-18 ENCOUNTER — Ambulatory Visit: Payer: Self-pay | Attending: Family Medicine | Admitting: Internal Medicine

## 2013-03-18 VITALS — BP 145/91 | HR 92 | Temp 98.7°F | Resp 14 | Ht 72.0 in | Wt 238.4 lb

## 2013-03-18 DIAGNOSIS — W19XXXA Unspecified fall, initial encounter: Secondary | ICD-10-CM | POA: Insufficient documentation

## 2013-03-18 DIAGNOSIS — Z23 Encounter for immunization: Secondary | ICD-10-CM

## 2013-03-18 DIAGNOSIS — M19049 Primary osteoarthritis, unspecified hand: Secondary | ICD-10-CM | POA: Insufficient documentation

## 2013-03-18 DIAGNOSIS — E119 Type 2 diabetes mellitus without complications: Secondary | ICD-10-CM

## 2013-03-18 DIAGNOSIS — M25569 Pain in unspecified knee: Secondary | ICD-10-CM | POA: Insufficient documentation

## 2013-03-18 DIAGNOSIS — I10 Essential (primary) hypertension: Secondary | ICD-10-CM | POA: Insufficient documentation

## 2013-03-18 DIAGNOSIS — M25539 Pain in unspecified wrist: Secondary | ICD-10-CM | POA: Insufficient documentation

## 2013-03-18 LAB — CBC WITH DIFFERENTIAL/PLATELET
BASOS ABS: 0 10*3/uL (ref 0.0–0.1)
Basophils Relative: 1 % (ref 0–1)
EOS PCT: 3 % (ref 0–5)
Eosinophils Absolute: 0.1 10*3/uL (ref 0.0–0.7)
HEMATOCRIT: 42.1 % (ref 36.0–46.0)
HEMOGLOBIN: 14.4 g/dL (ref 12.0–15.0)
LYMPHS PCT: 49 % — AB (ref 12–46)
Lymphs Abs: 2.6 10*3/uL (ref 0.7–4.0)
MCH: 29.1 pg (ref 26.0–34.0)
MCHC: 34.2 g/dL (ref 30.0–36.0)
MCV: 85.1 fL (ref 78.0–100.0)
MONO ABS: 0.4 10*3/uL (ref 0.1–1.0)
MONOS PCT: 8 % (ref 3–12)
NEUTROS ABS: 2.1 10*3/uL (ref 1.7–7.7)
Neutrophils Relative %: 39 % — ABNORMAL LOW (ref 43–77)
Platelets: 332 10*3/uL (ref 150–400)
RBC: 4.95 MIL/uL (ref 3.87–5.11)
RDW: 14.8 % (ref 11.5–15.5)
WBC: 5.3 10*3/uL (ref 4.0–10.5)

## 2013-03-18 LAB — COMPLETE METABOLIC PANEL WITH GFR
ALBUMIN: 4.1 g/dL (ref 3.5–5.2)
ALK PHOS: 86 U/L (ref 39–117)
ALT: 23 U/L (ref 0–35)
AST: 15 U/L (ref 0–37)
BILIRUBIN TOTAL: 0.4 mg/dL (ref 0.2–1.2)
BUN: 13 mg/dL (ref 6–23)
CO2: 25 mEq/L (ref 19–32)
Calcium: 9.5 mg/dL (ref 8.4–10.5)
Chloride: 104 mEq/L (ref 96–112)
Creat: 0.85 mg/dL (ref 0.50–1.10)
GFR, EST NON AFRICAN AMERICAN: 77 mL/min
GFR, Est African American: 89 mL/min
Glucose, Bld: 115 mg/dL — ABNORMAL HIGH (ref 70–99)
POTASSIUM: 4.2 meq/L (ref 3.5–5.3)
SODIUM: 139 meq/L (ref 135–145)
TOTAL PROTEIN: 7.3 g/dL (ref 6.0–8.3)

## 2013-03-18 LAB — GLUCOSE, POCT (MANUAL RESULT ENTRY): POC GLUCOSE: 106 mg/dL — AB (ref 70–99)

## 2013-03-18 LAB — LIPID PANEL
CHOL/HDL RATIO: 4.5 ratio
Cholesterol: 210 mg/dL — ABNORMAL HIGH (ref 0–200)
HDL: 47 mg/dL (ref >39)
LDL CALC: 111 mg/dL — AB (ref 0–99)
TRIGLYCERIDES: 261 mg/dL — AB (ref <150)
VLDL: 52 mg/dL — AB (ref 0–40)

## 2013-03-18 LAB — POCT GLYCOSYLATED HEMOGLOBIN (HGB A1C): Hemoglobin A1C: 7

## 2013-03-18 IMAGING — CR DG WRIST COMPLETE 3+V*R*
4 series · 4 of 4 positions shown · non-contrast
Comparison: None

CLINICAL DATA: Posterior lateral wrist pain post fall in [REDACTED]

EXAM:
RIGHT WRIST - COMPLETE 3+ VIEW

[x wrist pa right]
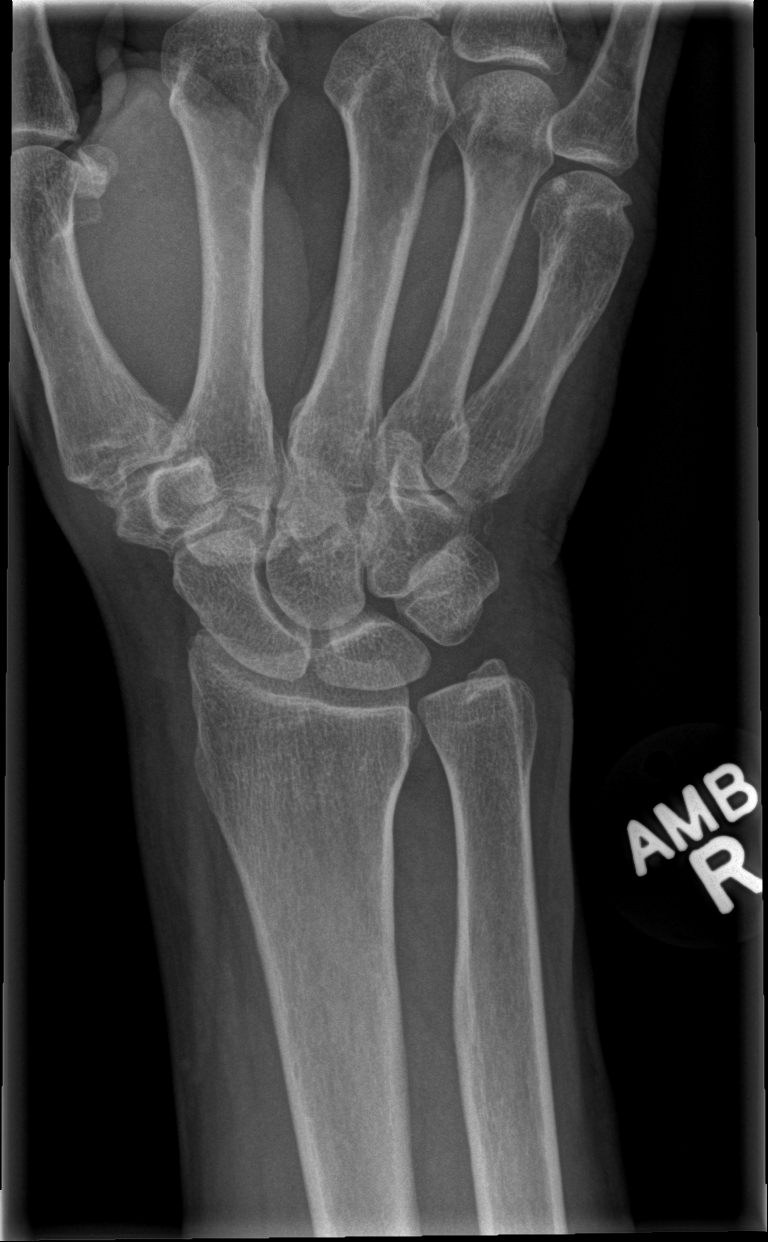

[x wrist obl right]
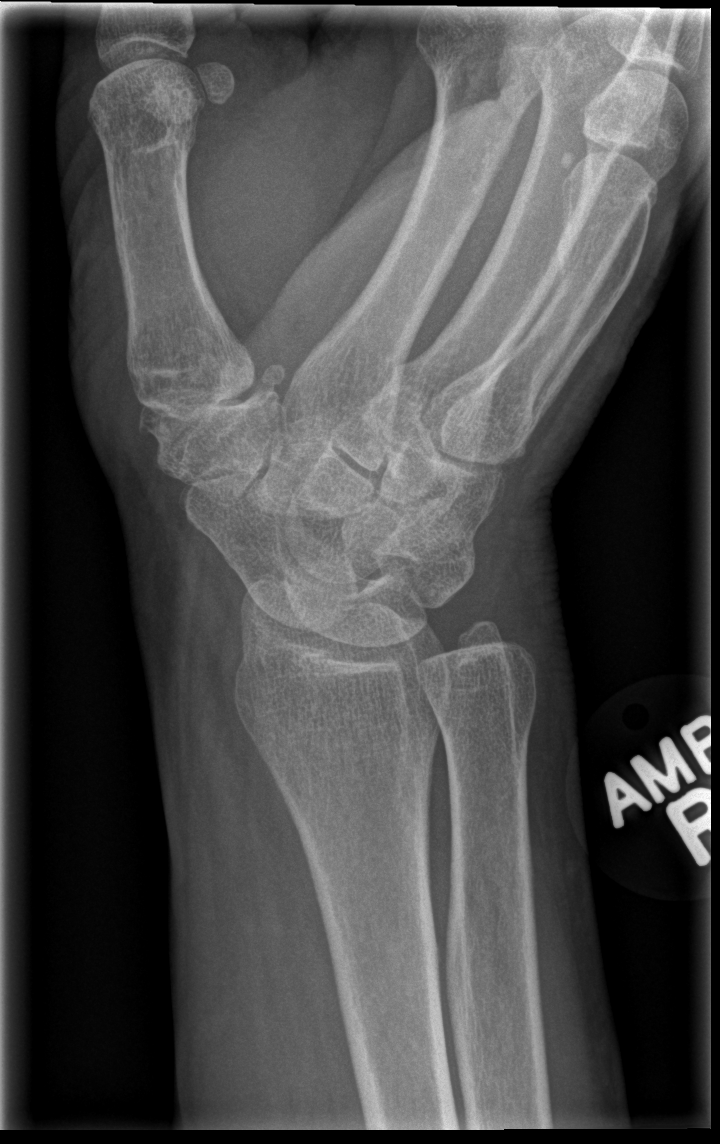

[x wrist lat right]
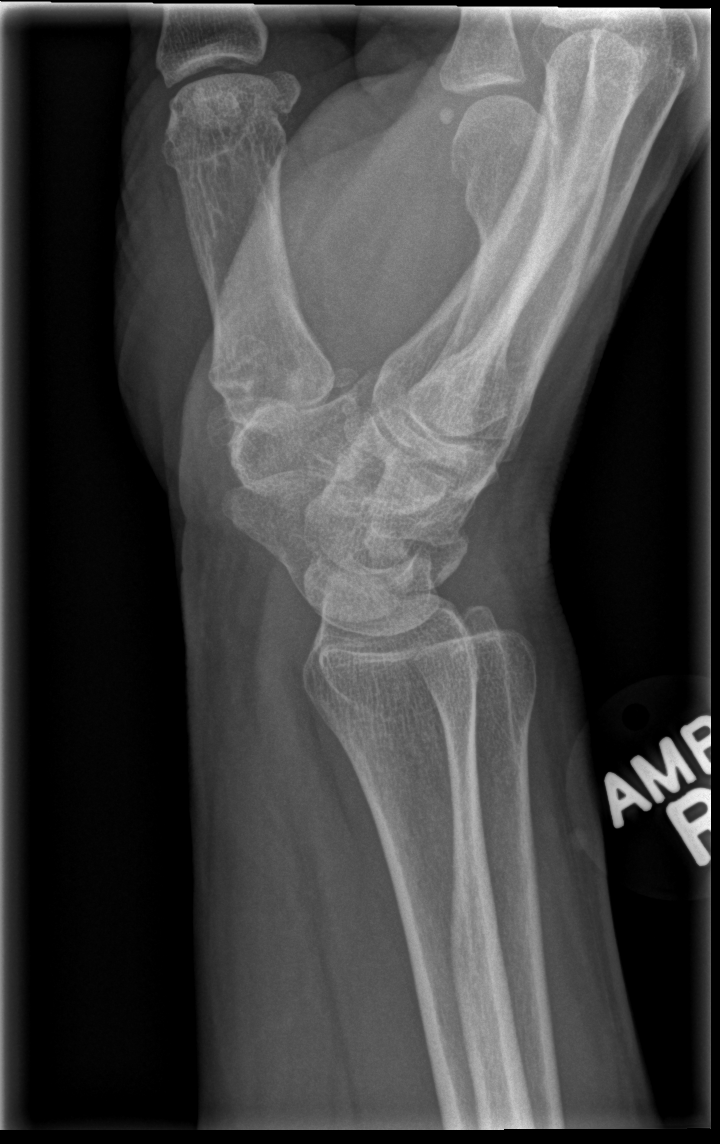

[x wrist navicular view right]
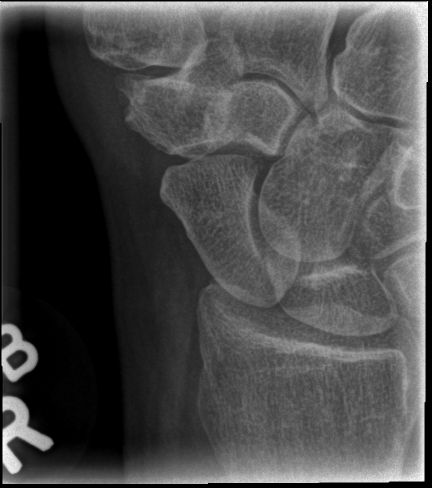

[4 of 4 positions shown; findings below may reference images not displayed]

FINDINGS: Bones appear mildly demineralized.

Minimal degenerative changes at first CMC joint.

Remaining joint spaces preserved.

No acute fracture, dislocation or bone destruction.
IMPRESSION: Minimal degenerative changes first CMC joint.

No acute abnormalities.

## 2013-03-18 IMAGING — CR DG KNEE COMPLETE 4+V*R*
4 series · 4 of 4 positions shown · non-contrast
Comparison: None

CLINICAL DATA: Infrapatellar pain, fell in [REDACTED]

EXAM:
RIGHT KNEE - COMPLETE 4+ VIEW

[t knee ap right]
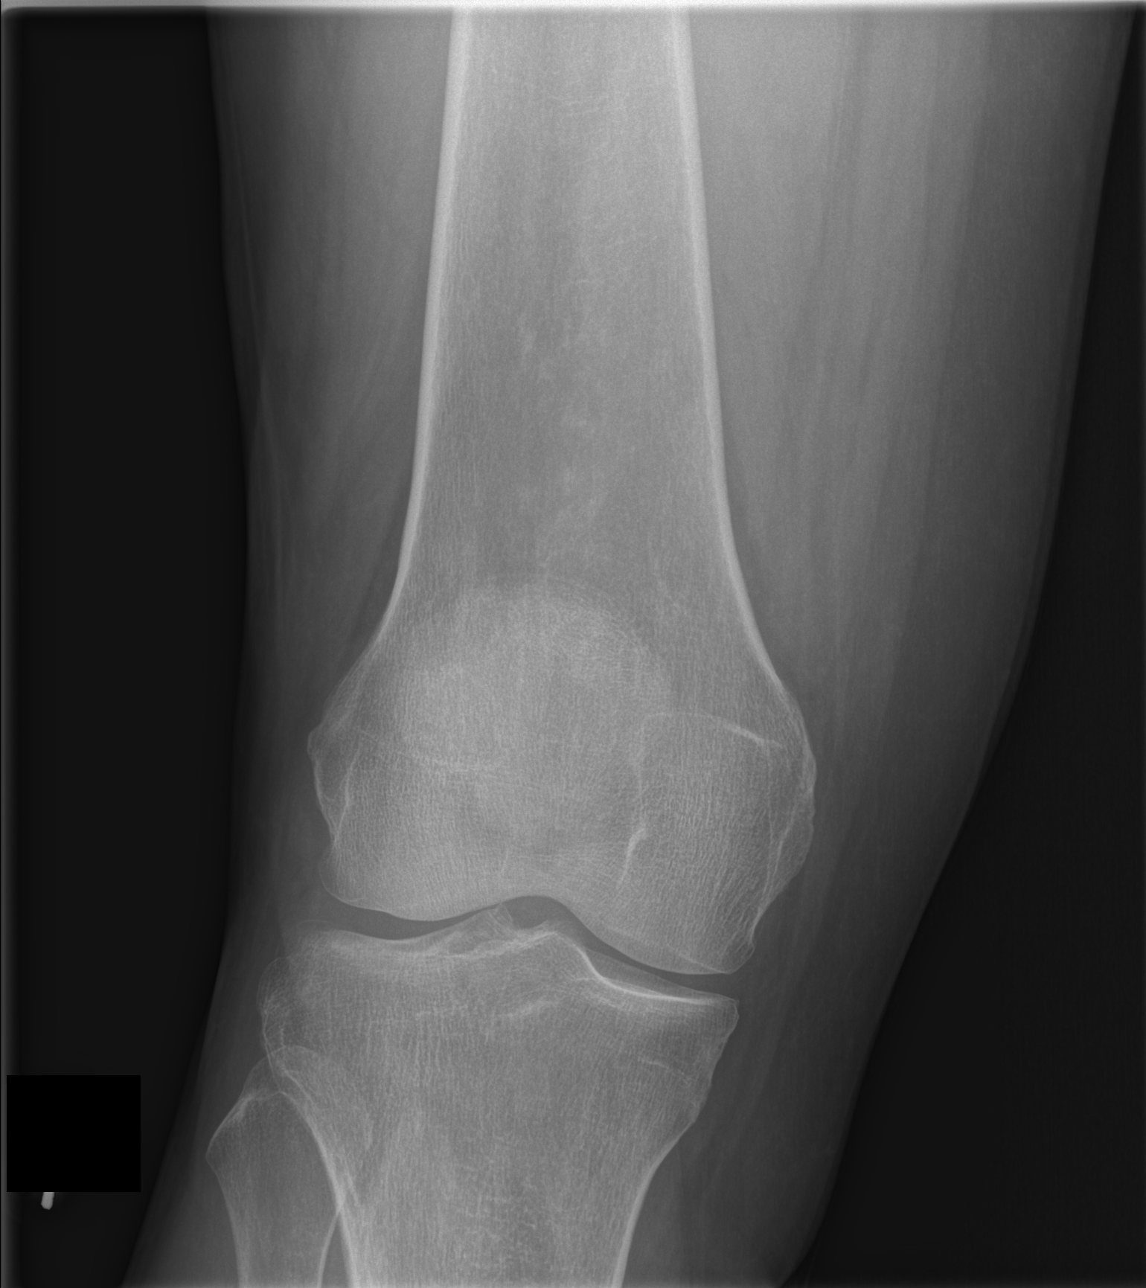

[t knee obl right (1 of 2)]
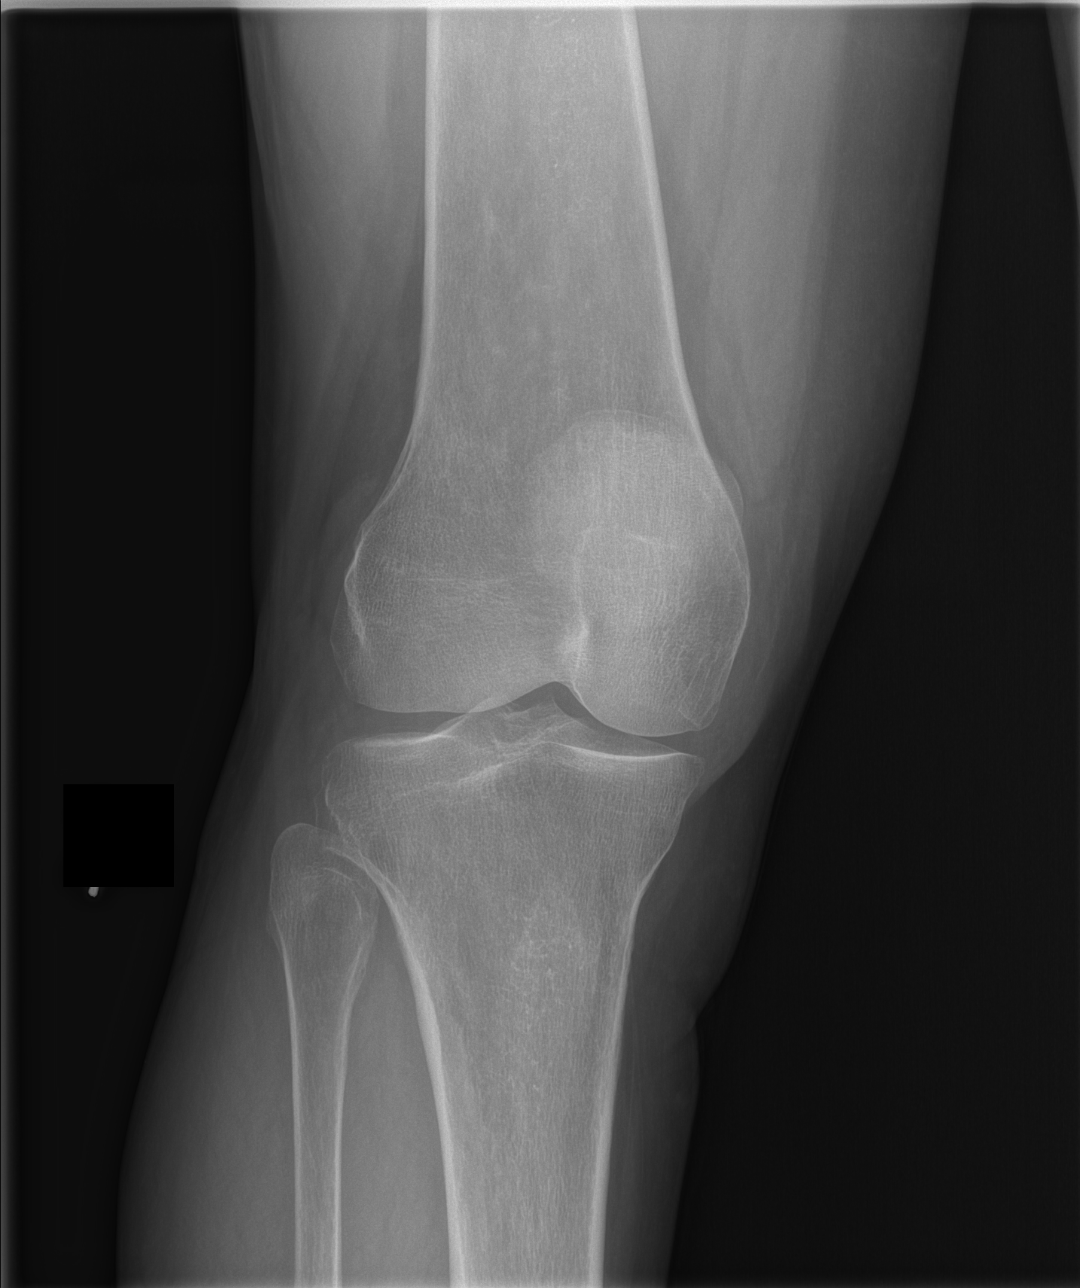

[t knee obl right (2 of 2)]
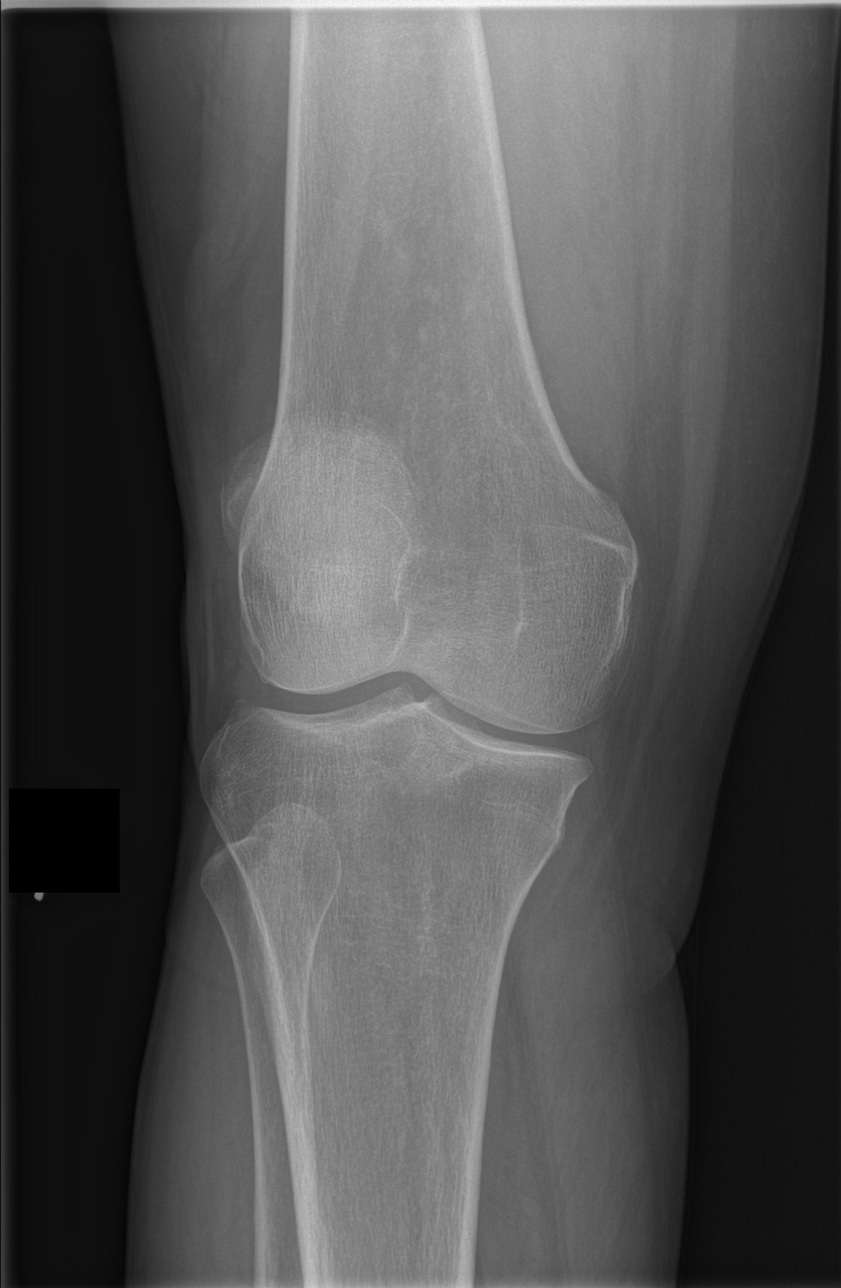

[t knee lat right]
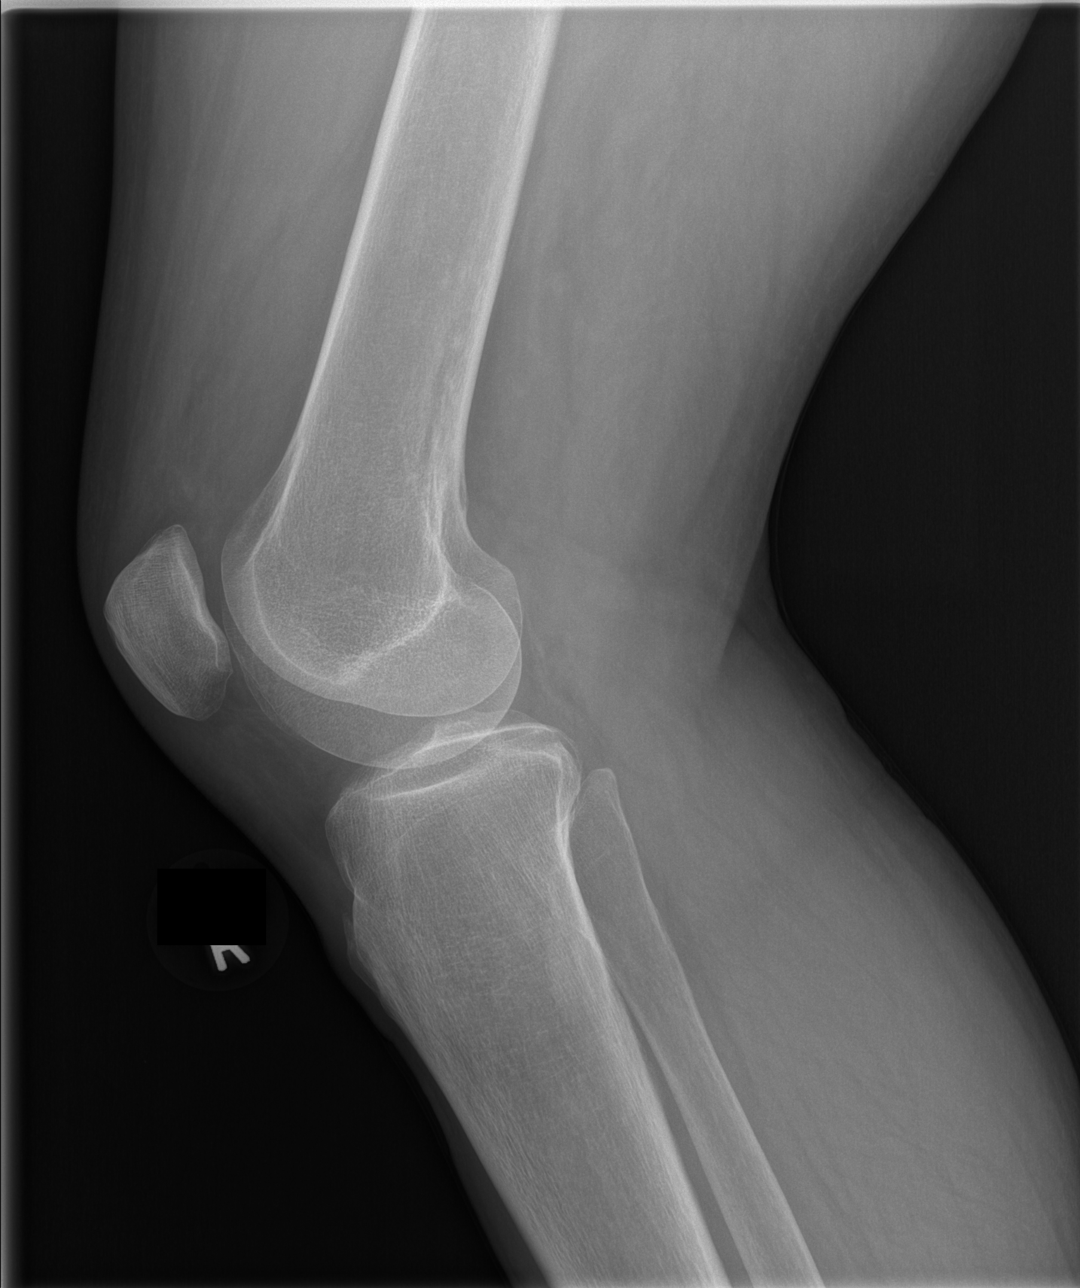

[4 of 4 positions shown; findings below may reference images not displayed]

FINDINGS: Bones appear demineralized.

Joint spaces preserved.

No acute fracture, dislocation or bone destruction.

No knee joint effusion.
IMPRESSION: No acute osseous abnormalities.

## 2013-03-18 MED ORDER — LISINOPRIL 20 MG PO TABS: 10.0000 mg | ORAL_TABLET | Freq: Every day | ORAL | Status: DC

## 2013-03-18 MED ORDER — METFORMIN HCL 500 MG PO TABS: 500.0000 mg | ORAL_TABLET | Freq: Two times a day (BID) | ORAL | Status: DC

## 2013-03-18 MED ORDER — LISINOPRIL 20 MG PO TABS: 20.0000 mg | ORAL_TABLET | Freq: Every day | ORAL | Status: DC

## 2013-03-18 MED ORDER — PROMETHAZINE HCL 25 MG PO TABS: 25.0000 mg | ORAL_TABLET | Freq: Four times a day (QID) | ORAL | Status: DC | PRN

## 2013-03-18 NOTE — Progress Notes (Signed)
Patient ID: Megan Lane, female   DOB: 06/02/1955, 58 y.o.   MRN: [id number]   CC:  HPI: 58 year old female here to establish care. She primarily complains of right wrist and right knee pain. She had a fall in December, but never went to the ER. She complains of wrist pain with flexion of the wrist. She also complains of right knee pain on the medial aspect of the knee was somewhat worse with movement. She has been taking Tylenol and ibuprofen over-the-counter for this. She has not taken her lisinopril and metformin x8 months. Her CBG today is 106. Blood pressures in the 140s. She had a Pap smear 3 years ago. She states that she's not had a mammogram in several years.  she had a colonoscopy 3 years ago which was negative and was asked to have an another one in 10 years   Allergies  Allergen Reactions  . Penicillins Rash   Past Medical History  Diagnosis Date  . Hypertension   . Diabetes mellitus    Current Outpatient Prescriptions on File Prior to Visit  Medication Sig Dispense Refill  . oxyCODONE-acetaminophen (PERCOCET/ROXICET) 5-325 MG per tablet Take 1 tablet by mouth every 4 (four) hours as needed for pain. May take 2 tablets PO q 6 hours for severe pain - Do not take with Tylenol as this tablet already contains tylenol  15 tablet  0   No current facility-administered medications on file prior to visit.   Family History  Problem Relation Age of Onset  . Diabetes Mother   . Hypertension Mother   . Stroke Mother   . Heart disease Brother    History   Social History  . Marital Status: Single    Spouse Name: N/A    Number of Children: N/A  . Years of Education: N/A   Occupational History  . Not on file.   Social History Main Topics  . Smoking status: Never Smoker   . Smokeless tobacco: Not on file  . Alcohol Use: No  . Drug Use: No  . Sexual Activity: Yes    Birth Control/ Protection: Surgical   Other Topics Concern  . Not on file   Social History Narrative   . No narrative on file    Review of Systems  Constitutional: As in history of present illness HENT: Negative for ear pain, nosebleeds, congestion, facial swelling, rhinorrhea, neck pain, neck stiffness and ear discharge.   Eyes: Negative for pain, discharge, redness, itching and visual disturbance.  Respiratory: Negative for cough, choking, chest tightness, shortness of breath, wheezing and stridor.   Cardiovascular: Negative for chest pain, palpitations and leg swelling.  Gastrointestinal: Negative for abdominal distention.  Genitourinary: Negative for dysuria, urgency, frequency, hematuria, flank pain, decreased urine volume, difficulty urinating and dyspareunia.  Musculoskeletal: As in history of present illness Neurological: Negative for dizziness, tremors, seizures, syncope, facial asymmetry, speech difficulty, weakness, light-headedness, numbness and headaches.  Hematological: Negative for adenopathy. Does not bruise/bleed easily.  Psychiatric/Behavioral: Negative for hallucinations, behavioral problems, confusion, dysphoric mood, decreased concentration and agitation.    Objective:   Filed Vitals:   03/18/13 1424  BP: 145/91  Pulse: 92  Temp: 98.7 F (37.1 C)  Resp: 14    Physical Exam  Constitutional: Appears well-developed and well-nourished. No distress.  HENT: Normocephalic. External right and left ear normal. Oropharynx is clear and moist.  Eyes: Conjunctivae and EOM are normal. PERRLA, no scleral icterus.  Neck: Normal ROM. Neck supple. No JVD. No tracheal deviation.  No thyromegaly.  CVS: RRR, S1/S2 +, no murmurs, no gallops, no carotid bruit.  Pulmonary: Effort and breath sounds normal, no stridor, rhonchi, wheezes, rales.  Abdominal: Soft. BS +,  no distension, tenderness, rebound or guarding.  Musculoskeletal: Patient has a deformed radial  ulnar joint. Pain on flexion of the right wrist. Pain on palpation of the medial aspect of the right knee  Lymphadenopathy:  No lymphadenopathy noted, cervical, inguinal. Neuro: Alert. Normal reflexes, muscle tone coordination. No cranial nerve deficit. Skin: Skin is warm and dry. No rash noted. Not diaphoretic. No erythema. No pallor.  Psychiatric: Normal mood and affect. Behavior, judgment, thought content normal.   Lab Results  Component Value Date   WBC 6.9 12/02/2009   HGB 12.6 12/02/2009   HCT 38.1 12/02/2009   MCV 84.9 12/02/2009   PLT 276 12/02/2009   Lab Results  Component Value Date   CREATININE 0.92 12/02/2009   BUN 10 12/02/2009   NA 137 12/02/2009   K 3.6 12/02/2009   CL 104 12/02/2009   CO2 26 12/02/2009    No results found for this basename: HGBA1C   Lipid Panel  No results found for this basename: chol, trig, hdl, cholhdl, vldl, ldlcalc       Assessment and plan:   There are no active problems to display for this patient.  Hypertension Restart lisinopril at 10 mg a day Renal panel today   Diabetes mellitus A1c pending if greater than 6.5 the patient will be restarted on metformin   Wrist pain and knee pain will obtain plain radiographs Patient most likely has an old healed fracture of the right wrist We'll refer to sports medicine versus orthopedics depending upon the results of x-ray   Establish care Baseline labs Tetanus and flu vaccination today Colonoscopy in the 2022, as per patient information Mammogram Gynecology referral for a Pap smear   Follow up in 3 months      The patient was given clear instructions to go to ER or return to medical center if symptoms don't improve, worsen or new problems develop. The patient verbalized understanding. The patient was told to call to get any lab results if not heard anything in the next week.

## 2013-03-18 NOTE — Telephone Encounter (Signed)
Notified patient that her X-ray of the right knee is negative.

## 2013-03-18 NOTE — Telephone Encounter (Signed)
Message copied by Narya Beavin, [place] R on Tue Mar 18, 2013  5:21 PM ------      Message from: Allyson Sabal MD, [facility]      Created: Tue Mar 18, 2013  4:19 PM       Notify patient that wrist x-ray does not show any acute fracture, dislocation or bony destruction ------

## 2013-03-18 NOTE — Progress Notes (Signed)
Patient is here to establish care. Patient is a Type II Diabetic and has been off her Metformin and Lisinopril x8 months. Noticed that BP and glucose has still been controlled. Patient has a glucometer at home. Complains of pain in Rt knee and wrist x3 months. Taking Tylenol OTC.

## 2013-03-19 ENCOUNTER — Telehealth: Payer: Self-pay | Admitting: *Deleted

## 2013-03-19 LAB — VITAMIN D 25 HYDROXY (VIT D DEFICIENCY, FRACTURES): Vit D, 25-Hydroxy: 43 ng/mL (ref 30–89)

## 2013-03-19 LAB — TSH: TSH: 0.592 u[IU]/mL (ref 0.350–4.500)

## 2013-03-19 NOTE — Telephone Encounter (Signed)
Message copied by Blease Capaldi, [place] R on Wed Mar 19, 2013  2:26 PM ------      Message from: Allyson Sabal MD, [facility]      Created: Wed Mar 19, 2013  1:19 PM       Notify patient of the labs are normal, except triglycerides were 261. Recommend the patient to be on a low-fat diet ------

## 2013-03-19 NOTE — Telephone Encounter (Signed)
Left a voicemail for patient to give us a call back.

## 2013-03-19 NOTE — Telephone Encounter (Signed)
Notified patient of the labs are normal, except triglycerides were 261. Recommend the patient to be on a low-fat diet.

## 2013-03-20 ENCOUNTER — Other Ambulatory Visit (HOSPITAL_COMMUNITY)
Admission: RE | Admit: 2013-03-20 | Discharge: 2013-03-20 | Disposition: A | Discharge status: Home / Self Care | Payer: Self-pay | Source: Ambulatory Visit | Attending: Family Medicine | Admitting: Family Medicine

## 2013-03-20 ENCOUNTER — Ambulatory Visit (INDEPENDENT_AMBULATORY_CARE_PROVIDER_SITE_OTHER): Payer: Self-pay | Admitting: Family Medicine

## 2013-03-20 ENCOUNTER — Encounter: Payer: Self-pay | Admitting: Family Medicine

## 2013-03-20 VITALS — BP 122/78 | HR 93 | Temp 99.3°F | Ht 72.0 in | Wt 234.3 lb

## 2013-03-20 DIAGNOSIS — Z1151 Encounter for screening for human papillomavirus (HPV): Secondary | ICD-10-CM | POA: Insufficient documentation

## 2013-03-20 DIAGNOSIS — Z124 Encounter for screening for malignant neoplasm of cervix: Secondary | ICD-10-CM | POA: Insufficient documentation

## 2013-03-20 DIAGNOSIS — Z01419 Encounter for gynecological examination (general) (routine) without abnormal findings: Secondary | ICD-10-CM | POA: Insufficient documentation

## 2013-03-20 NOTE — Assessment & Plan Note (Signed)
Will call with results.  - pending results will determine follow up for next pap smear.

## 2013-03-20 NOTE — Patient Instructions (Signed)
Thank you for coming in,   We will call you with the results of your pap smear.   Please see your primary care doctor colonoscopy and have another mammogram.  Mammograms are done every 1-2 years.   Thank you.

## 2013-03-20 NOTE — Progress Notes (Addendum)
Patient ID: Megan Lane, female   DOB: 01/05/1956, 58 y.o.   MRN: [id number] Subjective:     Megan Lane is a 58 y.o. female here for a routine exam.  Current complaints: Itch. Feels like a yeast infection. Has been using some vaginal cream. It has been helping. Started last week. Has had chills and nausea. Denies abdominal pain or back pain.     Gynecologic History No LMP recorded. Patient has had a hysterectomy. Contraception: none Last Pap: ~10 years ago. Results were: normal Last mammogram: 2 years ago . Results were: normal  Obstetric History OB History  No data available   [id number]. No complications during labor or delivery.   The following portions of the patient's history were reviewed and updated as appropriate: allergies, current medications, past family history, past medical history, past social history, past surgical history and problem list.  Review of Systems Pertinent items are noted in HPI.    Objective:    BP 122/78  Pulse 93  Temp(Src) 99.3 F (37.4 C) (Oral)  Ht 6' (1.829 m)  Wt 234 lb 4.8 oz (106.278 kg)  BMI 31.77 kg/m2 GU: > External: no lesions > Vagina: no blood in vault > Cervix: no lesion; white discharge noted; no motion tenderness > Uterus: small, mobile > Adnexa: no masses; non tender      Exam completed by Dr Raeford Razor  Assessment:    Healthy female exam.    Plan:    Follow up with primary doctor PRN. Will call with pap smear results.     FMTS ATTENDING NOTE Ronette Deter  I have discussed this patient with the resident. I agree with the resident's , assessment and care plan.

## 2013-03-27 ENCOUNTER — Telehealth: Payer: Self-pay | Admitting: Family Medicine

## 2013-03-27 NOTE — Telephone Encounter (Signed)
PAP test normal,result discussed with patient. As discussed with her her pelvic exam was completed by the resident, he had documented normal cervix and uterus. Patient has hx of hysterectomy many years ago,but she is not certain if she had partial or complete hysterectomy. As discussed with patient if she has had complete hysterectomy there is no need for obtaining PAP again, she will follow up with her PCP for reassessment. Patient verbalized understanding and agreed with plan.

## 2013-04-09 ENCOUNTER — Other Ambulatory Visit: Payer: Self-pay | Admitting: Internal Medicine

## 2013-04-09 DIAGNOSIS — Z1231 Encounter for screening mammogram for malignant neoplasm of breast: Secondary | ICD-10-CM

## 2013-04-30 ENCOUNTER — Encounter: Payer: Self-pay | Admitting: Internal Medicine

## 2013-04-30 ENCOUNTER — Ambulatory Visit: Payer: No Typology Code available for payment source | Attending: Internal Medicine | Admitting: Internal Medicine

## 2013-04-30 VITALS — BP 144/94 | HR 90 | Temp 98.5°F | Resp 16 | Ht 72.0 in | Wt 241.0 lb

## 2013-04-30 DIAGNOSIS — E1110 Type 2 diabetes mellitus with ketoacidosis without coma: Secondary | ICD-10-CM

## 2013-04-30 DIAGNOSIS — R0981 Nasal congestion: Secondary | ICD-10-CM

## 2013-04-30 DIAGNOSIS — Z79899 Other long term (current) drug therapy: Secondary | ICD-10-CM | POA: Insufficient documentation

## 2013-04-30 DIAGNOSIS — I10 Essential (primary) hypertension: Secondary | ICD-10-CM

## 2013-04-30 DIAGNOSIS — R059 Cough, unspecified: Secondary | ICD-10-CM

## 2013-04-30 DIAGNOSIS — M62830 Muscle spasm of back: Secondary | ICD-10-CM

## 2013-04-30 DIAGNOSIS — R05 Cough: Secondary | ICD-10-CM

## 2013-04-30 DIAGNOSIS — E119 Type 2 diabetes mellitus without complications: Secondary | ICD-10-CM | POA: Insufficient documentation

## 2013-04-30 DIAGNOSIS — J3489 Other specified disorders of nose and nasal sinuses: Secondary | ICD-10-CM | POA: Insufficient documentation

## 2013-04-30 DIAGNOSIS — E1310 Other specified diabetes mellitus with ketoacidosis without coma: Secondary | ICD-10-CM

## 2013-04-30 DIAGNOSIS — M5380 Other specified dorsopathies, site unspecified: Secondary | ICD-10-CM

## 2013-04-30 DIAGNOSIS — J069 Acute upper respiratory infection, unspecified: Secondary | ICD-10-CM

## 2013-04-30 LAB — GLUCOSE, POCT (MANUAL RESULT ENTRY): POC Glucose: 153 mg/dl — AB (ref 70–99)

## 2013-04-30 MED ORDER — FLUTICASONE PROPIONATE 50 MCG/ACT NA SUSP: 2.0000 | Freq: Every day | NASAL | Status: DC

## 2013-04-30 MED ORDER — BENZONATATE 100 MG PO CAPS: 100.0000 mg | ORAL_CAPSULE | Freq: Three times a day (TID) | ORAL | Status: DC | PRN

## 2013-04-30 MED ORDER — CYCLOBENZAPRINE HCL 10 MG PO TABS: 10.0000 mg | ORAL_TABLET | Freq: Three times a day (TID) | ORAL | Status: DC | PRN

## 2013-04-30 MED ORDER — AZITHROMYCIN 250 MG PO TABS: ORAL_TABLET | ORAL | Status: DC

## 2013-04-30 NOTE — Progress Notes (Signed)
Pt is here following up on her diabetes and HTN Pt has been coughing for 2 weeks which is causing her to have swelling and pain on her left side w/ radiation down her right leg Pt reports having occasional chest pain.

## 2013-04-30 NOTE — Progress Notes (Signed)
MRN: [id number] Name: Megan Lane  Sex: female Age: 58 y.o. DOB: 07/03/1955  Allergies: Penicillins  Chief Complaint  Patient presents with  . Follow-up    HPI: Patient is 58 y.o. female who history of diabetes, hypertension comes today reported to have lot of URI symptoms stuffy nose runny nose postnasal drip and coughing her, complaints of chills but denies any fever,complaints of chest secondary to coughing, she also reported to have lower back pain denies any fall or trauma denies any numbness weakness.   Past Medical History  Diagnosis Date  . Hypertension   . Diabetes mellitus     Past Surgical History  Procedure Laterality Date  . Knee surgery    . Abdominal hysterectomy        Medication List       This list is accurate as of: 04/30/13  4:20 PM.  Always use your most recent med list.               azithromycin 250 MG tablet  Commonly known as:  ZITHROMAX Z-PAK  Take as directed     benzonatate 100 MG capsule  Commonly known as:  TESSALON  Take 1 capsule (100 mg total) by mouth 3 (three) times daily as needed for cough.     cyclobenzaprine 10 MG tablet  Commonly known as:  FLEXERIL  Take 1 tablet (10 mg total) by mouth 3 (three) times daily as needed for muscle spasms.     fluticasone 50 MCG/ACT nasal spray  Commonly known as:  FLONASE  Place 2 sprays into both nostrils daily.     lisinopril 20 MG tablet  Commonly known as:  PRINIVIL,ZESTRIL  Take 0.5 tablets (10 mg total) by mouth daily.     metFORMIN 500 MG tablet  Commonly known as:  GLUCOPHAGE  Take 1 tablet (500 mg total) by mouth 2 (two) times daily with a meal.     oxyCODONE-acetaminophen 5-325 MG per tablet  Commonly known as:  PERCOCET/ROXICET  Take 1 tablet by mouth every 4 (four) hours as needed for pain. May take 2 tablets PO q 6 hours for severe pain - Do not take with Tylenol as this tablet already contains tylenol     promethazine 25 MG tablet  Commonly known as:   PHENERGAN  Take 1 tablet (25 mg total) by mouth every 6 (six) hours as needed for nausea.        Meds ordered this encounter  Medications  . azithromycin (ZITHROMAX Z-PAK) 250 MG tablet    Sig: Take as directed    Dispense:  6 each    Refill:  0  . fluticasone (FLONASE) 50 MCG/ACT nasal spray    Sig: Place 2 sprays into both nostrils daily.    Dispense:  16 g    Refill:  6  . cyclobenzaprine (FLEXERIL) 10 MG tablet    Sig: Take 1 tablet (10 mg total) by mouth 3 (three) times daily as needed for muscle spasms.    Dispense:  30 tablet    Refill:  0  . benzonatate (TESSALON) 100 MG capsule    Sig: Take 1 capsule (100 mg total) by mouth 3 (three) times daily as needed for cough.    Dispense:  30 capsule    Refill:  1    Immunization History  Administered Date(s) Administered  . Influenza,trivalent, recombinat, inj, PF 03/18/2013  . Tetanus 03/18/2013    Family History  Problem Relation Age of Onset  .  Diabetes Mother   . Hypertension Mother   . Stroke Mother   . Heart disease Brother     History  Substance Use Topics  . Smoking status: Never Smoker   . Smokeless tobacco: Not on file  . Alcohol Use: No    Review of Systems   As noted in HPI  Filed Vitals:   04/30/13 1544  BP: 144/94  Pulse: 90  Temp: 98.5 F (36.9 C)  Resp: 16    Physical Exam  Physical Exam  HENT:  Nasal congestion, minimal sinus tenderness   Eyes: EOM are normal. Pupils are equal, round, and reactive to light.  Cardiovascular: Normal rate and regular rhythm.   Pulmonary/Chest: Breath sounds normal. No respiratory distress. She has no wheezes. She has no rales.  Musculoskeletal:  Lower lumbar paraspinal tenderness, with SLR test patient complains of lower back pulling sensation     CBC    Component Value Date/Time   WBC 5.3 03/18/2013 1446   RBC 4.95 03/18/2013 1446   HGB 14.4 03/18/2013 1446   HCT 42.1 03/18/2013 1446   PLT 332 03/18/2013 1446   MCV 85.1 03/18/2013 1446    LYMPHSABS 2.6 03/18/2013 1446   MONOABS 0.4 03/18/2013 1446   EOSABS 0.1 03/18/2013 1446   BASOSABS 0.0 03/18/2013 1446    CMP     Component Value Date/Time   NA 139 03/18/2013 1446   K 4.2 03/18/2013 1446   CL 104 03/18/2013 1446   CO2 25 03/18/2013 1446   GLUCOSE 115* 03/18/2013 1446   BUN 13 03/18/2013 1446   CREATININE 0.85 03/18/2013 1446   CREATININE 0.92 12/02/2009 2102   CALCIUM 9.5 03/18/2013 1446   PROT 7.3 03/18/2013 1446   ALBUMIN 4.1 03/18/2013 1446   AST 15 03/18/2013 1446   ALT 23 03/18/2013 1446   ALKPHOS 86 03/18/2013 1446   BILITOT 0.4 03/18/2013 1446   GFRNONAA >60 12/02/2009 2102   GFRAA  Value: >60        The eGFR has been calculated using the MDRD equation. This calculation has not been validated in all clinical situations. eGFR's persistently <60 mL/min signify possible Chronic Kidney Disease. 12/02/2009 2102    Lab Results  Component Value Date/Time   CHOL 210* 03/18/2013  2:46 PM    No components found with this basename: hga1c    Lab Results  Component Value Date/Time   AST 15 03/18/2013  2:46 PM    Assessment and Plan  DM (diabetes mellitus) type 2, uncontrolled, with ketoacidosis - Plan: Glucose (CBG) Results for orders placed in visit on 04/30/13  GLUCOSE, POCT (MANUAL RESULT ENTRY)      Result Value Ref Range   POC Glucose 153 (*) 70 - 99 mg/dl   Continue with metformin, will repeat A1c on the following visit.  Essential hypertension, benign Advised for DASH diet continue with lisinopril 20 mg daily  URI (upper respiratory infection) - Plan: azithromycin (ZITHROMAX Z-PAK) 250 MG tablet  Cough - Plan: benzonatate (TESSALON) 100 MG capsule Advised patient for saltwater gargles  Back muscle spasm - Plan: cyclobenzaprine (FLEXERIL) 10 MG tablectAdvise for heating pad  Nasal congestion - Plan: fluticasone (FLONASE) 50 MCG/ACT nasal spray    Return in about 4 months (around 08/30/2013) for diabetes, hypertension.  Lorayne Marek, MD

## 2013-04-30 NOTE — Patient Instructions (Signed)
DASH Diet The DASH diet stands for "Dietary Approaches to Stop Hypertension." It is a healthy eating plan that has been shown to reduce high blood pressure (hypertension) in as little as 14 days, while also possibly providing other significant health benefits. These other health benefits include reducing the risk of breast cancer after menopause and reducing the risk of type 2 diabetes, heart disease, colon cancer, and stroke. Health benefits also include weight loss and slowing kidney failure in patients with chronic kidney disease.  DIET GUIDELINES  Limit salt (sodium). Your diet should contain less than 1500 mg of sodium daily.  Limit refined or processed carbohydrates. Your diet should include mostly whole grains. Desserts and added sugars should be used sparingly.  Include small amounts of heart-healthy fats. These types of fats include nuts, oils, and tub margarine. Limit saturated and trans fats. These fats have been shown to be harmful in the body. CHOOSING FOODS  The following food groups are based on a 2000 calorie diet. See your Registered Dietitian for individual calorie needs. Grains and Grain Products (6 to 8 servings daily)  Eat More Often: Whole-wheat bread, brown rice, whole-grain or wheat pasta, quinoa, popcorn without added fat or salt (air popped).  Eat Less Often: White bread, white pasta, white rice, cornbread. Vegetables (4 to 5 servings daily)  Eat More Often: Fresh, frozen, and canned vegetables. Vegetables may be raw, steamed, roasted, or grilled with a minimal amount of fat.  Eat Less Often/Avoid: Creamed or fried vegetables. Vegetables in a cheese sauce. Fruit (4 to 5 servings daily)  Eat More Often: All fresh, canned (in natural juice), or frozen fruits. Dried fruits without added sugar. One hundred percent fruit juice ( cup [237 mL] daily).  Eat Less Often: Dried fruits with added sugar. Canned fruit in light or heavy syrup. Lean Meats, Fish, and Poultry (2  servings or less daily. One serving is 3 to 4 oz [85-114 g]).  Eat More Often: Ninety percent or leaner ground beef, tenderloin, sirloin. Round cuts of beef, chicken breast, turkey breast. All fish. Grill, bake, or broil your meat. Nothing should be fried.  Eat Less Often/Avoid: Fatty cuts of meat, turkey, or chicken leg, thigh, or wing. Fried cuts of meat or fish. Dairy (2 to 3 servings)  Eat More Often: Low-fat or fat-free milk, low-fat plain or light yogurt, reduced-fat or part-skim cheese.  Eat Less Often/Avoid: Milk (whole, 2%).Whole milk yogurt. Full-fat cheeses. Nuts, Seeds, and Legumes (4 to 5 servings per week)  Eat More Often: All without added salt.  Eat Less Often/Avoid: Salted nuts and seeds, canned beans with added salt. Fats and Sweets (limited)  Eat More Often: Vegetable oils, tub margarines without trans fats, sugar-free gelatin. Mayonnaise and salad dressings.  Eat Less Often/Avoid: Coconut oils, palm oils, butter, stick margarine, cream, half and half, cookies, candy, pie. FOR MORE INFORMATION The Dash Diet Eating Plan: www.dashdiet.org Document Released: 01/12/2011 Document Revised: 04/17/2011 Document Reviewed: 01/12/2011 [employer] Patient Information 2014 [employer]. Diabetes Meal Planning Guide The diabetes meal planning guide is a tool to help you plan your meals and snacks. It is important for people with diabetes to manage their blood glucose (sugar) levels. Choosing the right foods and the right amounts throughout your day will help control your blood glucose. Eating right can even help you improve your blood pressure and reach or maintain a healthy weight. CARBOHYDRATE COUNTING MADE EASY When you eat carbohydrates, they turn to sugar. This raises your blood glucose level. Counting carbohydrates   can help you control this level so you feel better. When you plan your meals by counting carbohydrates, you can have more flexibility in what you eat and balance  your medicine with your food intake. Carbohydrate counting simply means adding up the total amount of carbohydrate grams in your meals and snacks. Try to eat about the same amount at each meal. Foods with carbohydrates are listed below. Each portion below is 1 carbohydrate serving or 15 grams of carbohydrates. Ask your dietician how many grams of carbohydrates you should eat at each meal or snack. Grains and Starches  1 slice bread.   English muffin or hotdog/hamburger bun.   cup cold cereal (unsweetened).   cup cooked pasta or rice.   cup starchy vegetables (corn, potatoes, peas, beans, winter squash).  1 tortilla (6 inches).   bagel.  1 waffle or pancake (size of a CD).   cup cooked cereal.  4 to 6 small crackers. *Whole grain is recommended. Fruit  1 cup fresh unsweetened berries, melon, papaya, pineapple.  1 small fresh fruit.   banana or mango.   cup fruit juice (4 oz unsweetened).   cup canned fruit in natural juice or water.  2 tbs dried fruit.  12 to 15 grapes or cherries. Milk and Yogurt  1 cup fat-free or 1% milk.  1 cup soy milk.  6 oz light yogurt with sugar-free sweetener.  6 oz low-fat soy yogurt.  6 oz plain yogurt. Vegetables  1 cup raw or  cup cooked is counted as 0 carbohydrates or a "free" food.  If you eat 3 or more servings at 1 meal, count them as 1 carbohydrate serving. Other Carbohydrates   oz chips or pretzels.   cup ice cream or frozen yogurt.   cup sherbet or sorbet.  2 inch square cake, no frosting.  1 tbs honey, sugar, jam, jelly, or syrup.  2 small cookies.  3 squares of graham crackers.  3 cups popcorn.  6 crackers.  1 cup broth-based soup.  Count 1 cup casserole or other mixed foods as 2 carbohydrate servings.  Foods with less than 20 calories in a serving may be counted as 0 carbohydrates or a "free" food. You may want to purchase a book or computer software that lists the carbohydrate gram  counts of different foods. In addition, the nutrition facts panel on the labels of the foods you eat are a good source of this information. The label will tell you how big the serving size is and the total number of carbohydrate grams you will be eating per serving. Divide this number by 15 to obtain the number of carbohydrate servings in a portion. Remember, 1 carbohydrate serving equals 15 grams of carbohydrate. SERVING SIZES Measuring foods and serving sizes helps you make sure you are getting the right amount of food. The list below tells how big or small some common serving sizes are.  1 oz.........4 stacked dice.  3 oz.........Deck of cards.  1 tsp........Tip of little finger.  1 tbs........Thumb.  2 tbs........Golf ball.   cup.......Half of a fist.  1 cup........A fist. SAMPLE DIABETES MEAL PLAN Below is a sample meal plan that includes foods from the grain and starches, dairy, vegetable, fruit, and meat groups. A dietician can individualize a meal plan to fit your calorie needs and tell you the number of servings needed from each food group. However, controlling the total amount of carbohydrates in your meal or snack is more important than making sure you   include all of the food groups at every meal. You may interchange carbohydrate containing foods (dairy, starches, and fruits). The meal plan below is an example of a 2000 calorie diet using carbohydrate counting. This meal plan has 17 carbohydrate servings. Breakfast  1 cup oatmeal (2 carb servings).   cup light yogurt (1 carb serving).  1 cup blueberries (1 carb serving).   cup almonds. Snack  1 large apple (2 carb servings).  1 low-fat string cheese stick. Lunch  Chicken breast salad.  1 cup spinach.   cup chopped tomatoes.  2 oz chicken breast, sliced.  2 tbs low-fat Italian dressing.  12 whole-wheat crackers (2 carb servings).  12 to 15 grapes (1 carb serving).  1 cup low-fat milk (1 carb  serving). Snack  1 cup carrots.   cup hummus (1 carb serving). Dinner  3 oz broiled salmon.  1 cup brown rice (3 carb servings). Snack  1  cups steamed broccoli (1 carb serving) drizzled with 1 tsp olive oil and lemon juice.  1 cup light pudding (2 carb servings). DIABETES MEAL PLANNING WORKSHEET Your dietician can use this worksheet to help you decide how many servings of foods and what types of foods are right for you.  BREAKFAST Food Group and Servings / Carb Servings Grain/Starches __________________________________ Dairy __________________________________________ Vegetable ______________________________________ Fruit ___________________________________________ Meat __________________________________________ Fat ____________________________________________ LUNCH Food Group and Servings / Carb Servings Grain/Starches ___________________________________ Dairy ___________________________________________ Fruit ____________________________________________ Meat ___________________________________________ Fat _____________________________________________ DINNER Food Group and Servings / Carb Servings Grain/Starches ___________________________________ Dairy ___________________________________________ Fruit ____________________________________________ Meat ___________________________________________ Fat _____________________________________________ SNACKS Food Group and Servings / Carb Servings Grain/Starches ___________________________________ Dairy ___________________________________________ Vegetable _______________________________________ Fruit ____________________________________________ Meat ___________________________________________ Fat _____________________________________________ DAILY TOTALS Starches _________________________ Vegetable ________________________ Fruit ____________________________ Dairy ____________________________ Meat  ____________________________ Fat ______________________________ Document Released: 10/20/2004 Document Revised: 04/17/2011 Document Reviewed: 08/31/2008 [employer] Patient Information 2014 [employer].  

## 2013-06-09 ENCOUNTER — Ambulatory Visit: Payer: No Typology Code available for payment source | Attending: Internal Medicine | Admitting: Internal Medicine

## 2013-06-09 ENCOUNTER — Encounter: Payer: Self-pay | Admitting: Internal Medicine

## 2013-06-09 VITALS — BP 140/80 | HR 78 | Temp 98.7°F | Resp 16 | Wt 239.4 lb

## 2013-06-09 DIAGNOSIS — Z79899 Other long term (current) drug therapy: Secondary | ICD-10-CM | POA: Insufficient documentation

## 2013-06-09 DIAGNOSIS — E119 Type 2 diabetes mellitus without complications: Secondary | ICD-10-CM | POA: Insufficient documentation

## 2013-06-09 DIAGNOSIS — I10 Essential (primary) hypertension: Secondary | ICD-10-CM | POA: Insufficient documentation

## 2013-06-09 DIAGNOSIS — R21 Rash and other nonspecific skin eruption: Secondary | ICD-10-CM

## 2013-06-09 LAB — POCT GLYCOSYLATED HEMOGLOBIN (HGB A1C): Hemoglobin A1C: 7.2

## 2013-06-09 LAB — GLUCOSE, POCT (MANUAL RESULT ENTRY): POC Glucose: 163 mg/dl — AB (ref 70–99)

## 2013-06-09 MED ORDER — METHYLPREDNISOLONE 4 MG PO KIT: PACK | ORAL | Status: DC

## 2013-06-09 MED ORDER — METFORMIN HCL 1000 MG PO TABS: 1000.0000 mg | ORAL_TABLET | Freq: Two times a day (BID) | ORAL | Status: DC

## 2013-06-09 NOTE — Progress Notes (Signed)
MRN: [id number] Name: Megan Lane  Sex: female Age: 58 y.o. DOB: 01/22/1956  Allergies: Penicillins  Chief Complaint  Patient presents with  . Follow-up    HPI: Patient is 58 y.o. female who has to of diabetes hypertension comes today for followup, as per patient she is compliant in taking her medications she is taking metformin 500 mg twice a day as per patient her her fasting sugar is between 110-160 mg a deciliter, she denies any hypoglycemic symptoms, her blood pressure was also elevated today, repeat manual blood pressure is 140/80 she is taking lisinopril 20 mg daily, she also reported to have noticed some rash on her left hand and forearm for the last several days which is itchy, she denies any change in soap detergent any cosmetics but did notice that it developed after she was cutting the grass. Patient denies any fever chills chest and shortness of breath.   Past Medical History  Diagnosis Date  . Hypertension   . Diabetes mellitus     Past Surgical History  Procedure Laterality Date  . Knee surgery    . Abdominal hysterectomy        Medication List       This list is accurate as of: 06/09/13  3:51 PM.  Always use your most recent med list.               azithromycin 250 MG tablet  Commonly known as:  ZITHROMAX Z-PAK  Take as directed     benzonatate 100 MG capsule  Commonly known as:  TESSALON  Take 1 capsule (100 mg total) by mouth 3 (three) times daily as needed for cough.     cyclobenzaprine 10 MG tablet  Commonly known as:  FLEXERIL  Take 1 tablet (10 mg total) by mouth 3 (three) times daily as needed for muscle spasms.     fluticasone 50 MCG/ACT nasal spray  Commonly known as:  FLONASE  Place 2 sprays into both nostrils daily.     lisinopril 20 MG tablet  Commonly known as:  PRINIVIL,ZESTRIL  Take 0.5 tablets (10 mg total) by mouth daily.     metFORMIN 1000 MG tablet  Commonly known as:  GLUCOPHAGE  Take 1 tablet (1,000 mg total) by  mouth 2 (two) times daily with a meal.     methylPREDNISolone 4 MG tablet  Commonly known as:  MEDROL DOSEPAK  follow package directions     oxyCODONE-acetaminophen 5-325 MG per tablet  Commonly known as:  PERCOCET/ROXICET  Take 1 tablet by mouth every 4 (four) hours as needed for pain. May take 2 tablets PO q 6 hours for severe pain - Do not take with Tylenol as this tablet already contains tylenol     promethazine 25 MG tablet  Commonly known as:  PHENERGAN  Take 1 tablet (25 mg total) by mouth every 6 (six) hours as needed for nausea.        Meds ordered this encounter  Medications  . methylPREDNISolone (MEDROL DOSEPAK) 4 MG tablet    Sig: follow package directions    Dispense:  21 tablet    Refill:  0  . metFORMIN (GLUCOPHAGE) 1000 MG tablet    Sig: Take 1 tablet (1,000 mg total) by mouth 2 (two) times daily with a meal.    Dispense:  60 tablet    Refill:  3    Immunization History  Administered Date(s) Administered  . Influenza,trivalent, recombinat, inj, PF 03/18/2013  . Tetanus 03/18/2013  Family History  Problem Relation Age of Onset  . Diabetes Mother   . Hypertension Mother   . Stroke Mother   . Heart disease Brother     History  Substance Use Topics  . Smoking status: Never Smoker   . Smokeless tobacco: Not on file  . Alcohol Use: No    Review of Systems   As noted in HPI  Filed Vitals:   06/09/13 1545  BP: 140/80  Pulse:   Temp:   Resp:     Physical Exam  Physical Exam  Constitutional: No distress.  Eyes: EOM are normal. Pupils are equal, round, and reactive to light.  Cardiovascular: Normal rate and regular rhythm.   Pulmonary/Chest: Breath sounds normal. No respiratory distress. She has no wheezes. She has no rales.  Skin:  Rash on left hand and forearm , a few small vesicular   Lesion ? Poison ivy exposure     CBC    Component Value Date/Time   WBC 5.3 03/18/2013 1446   RBC 4.95 03/18/2013 1446   HGB 14.4 03/18/2013 1446    HCT 42.1 03/18/2013 1446   PLT 332 03/18/2013 1446   MCV 85.1 03/18/2013 1446   LYMPHSABS 2.6 03/18/2013 1446   MONOABS 0.4 03/18/2013 1446   EOSABS 0.1 03/18/2013 1446   BASOSABS 0.0 03/18/2013 1446    CMP     Component Value Date/Time   NA 139 03/18/2013 1446   K 4.2 03/18/2013 1446   CL 104 03/18/2013 1446   CO2 25 03/18/2013 1446   GLUCOSE 115* 03/18/2013 1446   BUN 13 03/18/2013 1446   CREATININE 0.85 03/18/2013 1446   CREATININE 0.92 12/02/2009 2102   CALCIUM 9.5 03/18/2013 1446   PROT 7.3 03/18/2013 1446   ALBUMIN 4.1 03/18/2013 1446   AST 15 03/18/2013 1446   ALT 23 03/18/2013 1446   ALKPHOS 86 03/18/2013 1446   BILITOT 0.4 03/18/2013 1446   GFRNONAA 77 03/18/2013 1446   GFRNONAA >60 12/02/2009 2102   GFRAA 89 03/18/2013 1446   GFRAA  Value: >60        The eGFR has been calculated using the MDRD equation. This calculation has not been validated in all clinical situations. eGFR's persistently <60 mL/min signify possible Chronic Kidney Disease. 12/02/2009 2102    Lab Results  Component Value Date/Time   CHOL 210* 03/18/2013  2:46 PM    No components found with this basename: hga1c    Lab Results  Component Value Date/Time   AST 15 03/18/2013  2:46 PM    Assessment and Plan  DM (diabetes mellitus) - Plan:  Results for orders placed in visit on 06/09/13  GLUCOSE, POCT (MANUAL RESULT ENTRY)      Result Value Ref Range   POC Glucose 163 (*) 70 - 99 mg/dl  POCT GLYCOSYLATED HEMOGLOBIN (HGB A1C)      Result Value Ref Range   Hemoglobin A1C 7.2     Hemoglobin A1c is trending up, I have increased the dose of metformin to 1 g twice a day metFORMIN (GLUCOPHAGE) 1000 MG tablet, advised patient to keep that her fingerstick log.  Essential hypertension, benign Patient is on lisinopril 20 mg daily, borderline elevated blood pressure I advised her for DASH diet.  Rash and nonspecific skin eruption - Plan: methylPREDNISolone (MEDROL DOSEPAK) 4 MG tablet  Will do fasting blood work on the  next visit.  Return in about 3 months (around 09/09/2013) for diabetes, hypertension.  Lorayne Marek, MD

## 2013-06-09 NOTE — Progress Notes (Signed)
Patient here for follow up on her DM Has some type of rash to left hand

## 2013-06-09 NOTE — Patient Instructions (Addendum)
Diabetes Meal Planning Guide The diabetes meal planning guide is a tool to help you plan your meals and snacks. It is important for people with diabetes to manage their blood glucose (sugar) levels. Choosing the right foods and the right amounts throughout your day will help control your blood glucose. Eating right can even help you improve your blood pressure and reach or maintain a healthy weight. CARBOHYDRATE COUNTING MADE EASY When you eat carbohydrates, they turn to sugar. This raises your blood glucose level. Counting carbohydrates can help you control this level so you feel better. When you plan your meals by counting carbohydrates, you can have more flexibility in what you eat and balance your medicine with your food intake. Carbohydrate counting simply means adding up the total amount of carbohydrate grams in your meals and snacks. Try to eat about the same amount at each meal. Foods with carbohydrates are listed below. Each portion below is 1 carbohydrate serving or 15 grams of carbohydrates. Ask your dietician how many grams of carbohydrates you should eat at each meal or snack. Grains and Starches  1 slice bread.   English muffin or hotdog/hamburger bun.   cup cold cereal (unsweetened).   cup cooked pasta or rice.   cup starchy vegetables (corn, potatoes, peas, beans, winter squash).  1 tortilla (6 inches).   bagel.  1 waffle or pancake (size of a CD).   cup cooked cereal.  4 to 6 small crackers. *Whole grain is recommended. Fruit  1 cup fresh unsweetened berries, melon, papaya, pineapple.  1 small fresh fruit.   banana or mango.   cup fruit juice (4 oz unsweetened).   cup canned fruit in natural juice or water.  2 tbs dried fruit.  12 to 15 grapes or cherries. Milk and Yogurt  1 cup fat-free or 1% milk.  1 cup soy milk.  6 oz light yogurt with sugar-free sweetener.  6 oz low-fat soy yogurt.  6 oz plain yogurt. Vegetables  1 cup raw or  cup  cooked is counted as 0 carbohydrates or a "free" food.  If you eat 3 or more servings at 1 meal, count them as 1 carbohydrate serving. Other Carbohydrates   oz chips or pretzels.   cup ice cream or frozen yogurt.   cup sherbet or sorbet.  2 inch square cake, no frosting.  1 tbs honey, sugar, jam, jelly, or syrup.  2 small cookies.  3 squares of graham crackers.  3 cups popcorn.  6 crackers.  1 cup broth-based soup.  Count 1 cup casserole or other mixed foods as 2 carbohydrate servings.  Foods with less than 20 calories in a serving may be counted as 0 carbohydrates or a "free" food. You may want to purchase a book or computer software that lists the carbohydrate gram counts of different foods. In addition, the nutrition facts panel on the labels of the foods you eat are a good source of this information. The label will tell you how big the serving size is and the total number of carbohydrate grams you will be eating per serving. Divide this number by 15 to obtain the number of carbohydrate servings in a portion. Remember, 1 carbohydrate serving equals 15 grams of carbohydrate. SERVING SIZES Measuring foods and serving sizes helps you make sure you are getting the right amount of food. The list below tells how big or small some common serving sizes are.  1 oz.........4 stacked dice.  3 oz.........Deck of cards.  1 tsp........Tip   of little finger.  1 tbs........Thumb.  2 tbs........Golf ball.   cup.......Half of a fist.  1 cup........A fist. SAMPLE DIABETES MEAL PLAN Below is a sample meal plan that includes foods from the grain and starches, dairy, vegetable, fruit, and meat groups. A dietician can individualize a meal plan to fit your calorie needs and tell you the number of servings needed from each food group. However, controlling the total amount of carbohydrates in your meal or snack is more important than making sure you include all of the food groups at every  meal. You may interchange carbohydrate containing foods (dairy, starches, and fruits). The meal plan below is an example of a 2000 calorie diet using carbohydrate counting. This meal plan has 17 carbohydrate servings. Breakfast  1 cup oatmeal (2 carb servings).   cup light yogurt (1 carb serving).  1 cup blueberries (1 carb serving).   cup almonds. Snack  1 large apple (2 carb servings).  1 low-fat string cheese stick. Lunch  Chicken breast salad.  1 cup spinach.   cup chopped tomatoes.  2 oz chicken breast, sliced.  2 tbs low-fat Italian dressing.  12 whole-wheat crackers (2 carb servings).  12 to 15 grapes (1 carb serving).  1 cup low-fat milk (1 carb serving). Snack  1 cup carrots.   cup hummus (1 carb serving). Dinner  3 oz broiled salmon.  1 cup brown rice (3 carb servings). Snack  1  cups steamed broccoli (1 carb serving) drizzled with 1 tsp olive oil and lemon juice.  1 cup light pudding (2 carb servings). DIABETES MEAL PLANNING WORKSHEET Your dietician can use this worksheet to help you decide how many servings of foods and what types of foods are right for you.  BREAKFAST Food Group and Servings / Carb Servings Grain/Starches __________________________________ Dairy __________________________________________ Vegetable ______________________________________ Fruit ___________________________________________ Meat __________________________________________ Fat ____________________________________________ LUNCH Food Group and Servings / Carb Servings Grain/Starches ___________________________________ Dairy ___________________________________________ Fruit ____________________________________________ Meat ___________________________________________ Fat _____________________________________________ DINNER Food Group and Servings / Carb Servings Grain/Starches ___________________________________ Dairy  ___________________________________________ Fruit ____________________________________________ Meat ___________________________________________ Fat _____________________________________________ SNACKS Food Group and Servings / Carb Servings Grain/Starches ___________________________________ Dairy ___________________________________________ Vegetable _______________________________________ Fruit ____________________________________________ Meat ___________________________________________ Fat _____________________________________________ DAILY TOTALS Starches _________________________ Vegetable ________________________ Fruit ____________________________ Dairy ____________________________ Meat ____________________________ Fat ______________________________ Document Released: 10/20/2004 Document Revised: 04/17/2011 Document Reviewed: 08/31/2008 [employer] Patient Information 2014 [employer] DASH Diet The DASH diet stands for "Dietary Approaches to Stop Hypertension." It is a healthy eating plan that has been shown to reduce high blood pressure (hypertension) in as little as 14 days, while also possibly providing other significant health benefits. These other health benefits include reducing the risk of breast cancer after menopause and reducing the risk of type 2 diabetes, heart disease, colon cancer, and stroke. Health benefits also include weight loss and slowing kidney failure in patients with chronic kidney disease.  DIET GUIDELINES  Limit salt (sodium). Your diet should contain less than 1500 mg of sodium daily.  Limit refined or processed carbohydrates. Your diet should include mostly whole grains. Desserts and added sugars should be used sparingly.  Include small amounts of heart-healthy fats. These types of fats include nuts, oils, and tub margarine. Limit saturated and trans fats. These fats have been shown to be harmful in the body. CHOOSING FOODS  The following food groups  are based on a 2000 calorie diet. See your Registered Dietitian for individual calorie needs. Grains and Grain Products (6 to 8 servings daily)  Eat More Often:   Whole-wheat bread, brown rice, whole-grain or wheat pasta, quinoa, popcorn without added fat or salt (air popped).  Eat Less Often: White bread, white pasta, white rice, cornbread. Vegetables (4 to 5 servings daily)  Eat More Often: Fresh, frozen, and canned vegetables. Vegetables may be raw, steamed, roasted, or grilled with a minimal amount of fat.  Eat Less Often/Avoid: Creamed or fried vegetables. Vegetables in a cheese sauce. Fruit (4 to 5 servings daily)  Eat More Often: All fresh, canned (in natural juice), or frozen fruits. Dried fruits without added sugar. One hundred percent fruit juice ( cup [237 mL] daily).  Eat Less Often: Dried fruits with added sugar. Canned fruit in light or heavy syrup. Lean Meats, Fish, and Poultry (2 servings or less daily. One serving is 3 to 4 oz [85-114 g]).  Eat More Often: Ninety percent or leaner ground beef, tenderloin, sirloin. Round cuts of beef, chicken breast, turkey breast. All fish. Grill, bake, or broil your meat. Nothing should be fried.  Eat Less Often/Avoid: Fatty cuts of meat, turkey, or chicken leg, thigh, or wing. Fried cuts of meat or fish. Dairy (2 to 3 servings)  Eat More Often: Low-fat or fat-free milk, low-fat plain or light yogurt, reduced-fat or part-skim cheese.  Eat Less Often/Avoid: Milk (whole, 2%).Whole milk yogurt. Full-fat cheeses. Nuts, Seeds, and Legumes (4 to 5 servings per week)  Eat More Often: All without added salt.  Eat Less Often/Avoid: Salted nuts and seeds, canned beans with added salt. Fats and Sweets (limited)  Eat More Often: Vegetable oils, tub margarines without trans fats, sugar-free gelatin. Mayonnaise and salad dressings.  Eat Less Often/Avoid: Coconut oils, palm oils, butter, stick margarine, cream, half and half, cookies, candy,  pie. FOR MORE INFORMATION The Dash Diet Eating Plan: www.dashdiet.org Document Released: 01/12/2011 Document Revised: 04/17/2011 Document Reviewed: 01/12/2011 [employer] Patient Information 2014 [employer].  

## 2013-06-16 ENCOUNTER — Ambulatory Visit: Payer: Self-pay | Admitting: Internal Medicine

## 2013-09-09 ENCOUNTER — Ambulatory Visit: Payer: No Typology Code available for payment source | Admitting: Internal Medicine

## 2013-10-07 ENCOUNTER — Telehealth: Payer: Self-pay | Admitting: Internal Medicine

## 2013-10-07 NOTE — Telephone Encounter (Signed)
Patient was calling in to make an appointment; please schedule when call is returned

## 2013-10-16 ENCOUNTER — Ambulatory Visit: Payer: Self-pay | Attending: Internal Medicine | Admitting: Internal Medicine

## 2013-10-16 ENCOUNTER — Encounter: Payer: Self-pay | Admitting: Internal Medicine

## 2013-10-16 VITALS — BP 152/86 | HR 82 | Temp 98.4°F | Wt 236.8 lb

## 2013-10-16 DIAGNOSIS — R1013 Epigastric pain: Secondary | ICD-10-CM

## 2013-10-16 DIAGNOSIS — E1169 Type 2 diabetes mellitus with other specified complication: Secondary | ICD-10-CM | POA: Insufficient documentation

## 2013-10-16 DIAGNOSIS — Z1211 Encounter for screening for malignant neoplasm of colon: Secondary | ICD-10-CM

## 2013-10-16 DIAGNOSIS — I10 Essential (primary) hypertension: Secondary | ICD-10-CM

## 2013-10-16 DIAGNOSIS — E119 Type 2 diabetes mellitus without complications: Secondary | ICD-10-CM | POA: Insufficient documentation

## 2013-10-16 DIAGNOSIS — E089 Diabetes mellitus due to underlying condition without complications: Secondary | ICD-10-CM

## 2013-10-16 DIAGNOSIS — Z139 Encounter for screening, unspecified: Secondary | ICD-10-CM

## 2013-10-16 DIAGNOSIS — E139 Other specified diabetes mellitus without complications: Secondary | ICD-10-CM

## 2013-10-16 LAB — GLUCOSE, POCT (MANUAL RESULT ENTRY): POC GLUCOSE: 169 mg/dL — AB (ref 70–99)

## 2013-10-16 LAB — LIPID PANEL
CHOL/HDL RATIO: 4.3 ratio
Cholesterol: 200 mg/dL (ref 0–200)
HDL: 47 mg/dL (ref >39)
LDL CALC: 130 mg/dL — AB (ref 0–99)
TRIGLYCERIDES: 116 mg/dL (ref <150)
VLDL: 23 mg/dL (ref 0–40)

## 2013-10-16 LAB — COMPLETE METABOLIC PANEL WITH GFR
ALBUMIN: 4.2 g/dL (ref 3.5–5.2)
ALT: 27 U/L (ref 0–35)
AST: 19 U/L (ref 0–37)
Alkaline Phosphatase: 85 U/L (ref 39–117)
BILIRUBIN TOTAL: 0.4 mg/dL (ref 0.2–1.2)
BUN: 13 mg/dL (ref 6–23)
CHLORIDE: 106 meq/L (ref 96–112)
CO2: 26 meq/L (ref 19–32)
Calcium: 9.4 mg/dL (ref 8.4–10.5)
Creat: 0.81 mg/dL (ref 0.50–1.10)
GFR, EST NON AFRICAN AMERICAN: 81 mL/min
GLUCOSE: 165 mg/dL — AB (ref 70–99)
POTASSIUM: 4.7 meq/L (ref 3.5–5.3)
SODIUM: 140 meq/L (ref 135–145)
TOTAL PROTEIN: 7 g/dL (ref 6.0–8.3)

## 2013-10-16 LAB — POCT GLYCOSYLATED HEMOGLOBIN (HGB A1C): Hemoglobin A1C: 7

## 2013-10-16 MED ORDER — LISINOPRIL 30 MG PO TABS: 30.0000 mg | ORAL_TABLET | Freq: Every day | ORAL | Status: DC

## 2013-10-16 MED ORDER — GLYBURIDE 2.5 MG PO TABS: 2.5000 mg | ORAL_TABLET | Freq: Two times a day (BID) | ORAL | Status: DC

## 2013-10-16 NOTE — Progress Notes (Signed)
MRN: [id number] Name: Megan Lane  Sex: female Age: 58 y.o. DOB: 09/06/1955  Allergies: Penicillins  Chief Complaint  Patient presents with  . Diabetes    HPI: Patient is 58 y.o. female who has to of diabetes hypertension comes today for followup, on the last visit her her metformin was increased, she has slight improvement in her A1c currently 7.0%, as per patient her fasting sugar is usually between 120-170 mg/dL , she denies any hypoglycemic symptoms she is taking metformin 1 g twice a day also her blood pressure trend has been going up currently she is taking lisinopril 20 mg daily, patient is up-to-date with her Pap smear, has not had mammogram or Pap smear done.  occasionally has some chest pain which gets better with ibuprofen, she had a stress test done in the past which was negative, denies any orthopnea or PND or leg swelling. Patient is complaining of epigastric pain, no relationship with food intake, and feels like a knot inside.  Past Medical History  Diagnosis Date  . Hypertension   . Diabetes mellitus     Past Surgical History  Procedure Laterality Date  . Knee surgery    . Abdominal hysterectomy        Medication List       This list is accurate as of: 10/16/13 10:14 AM.  Always use your most recent med list.               benzonatate 100 MG capsule  Commonly known as:  TESSALON  Take 1 capsule (100 mg total) by mouth 3 (three) times daily as needed for cough.     glyBURIDE 2.5 MG tablet  Commonly known as:  DIABETA  Take 1 tablet (2.5 mg total) by mouth 2 (two) times daily with a meal.     lisinopril 30 MG tablet  Commonly known as:  PRINIVIL,ZESTRIL  Take 1 tablet (30 mg total) by mouth daily.     metFORMIN 1000 MG tablet  Commonly known as:  GLUCOPHAGE  Take 1 tablet (1,000 mg total) by mouth 2 (two) times daily with a meal.        Meds ordered this encounter  Medications  . lisinopril (PRINIVIL,ZESTRIL) 30 MG tablet    Sig: Take 1  tablet (30 mg total) by mouth daily.    Dispense:  30 tablet    Refill:  3  . glyBURIDE (DIABETA) 2.5 MG tablet    Sig: Take 1 tablet (2.5 mg total) by mouth 2 (two) times daily with a meal.    Dispense:  60 tablet    Refill:  3    Immunization History  Administered Date(s) Administered  . Influenza,trivalent, recombinat, inj, PF 03/18/2013  . Tetanus 03/18/2013    Family History  Problem Relation Age of Onset  . Diabetes Mother   . Hypertension Mother   . Stroke Mother   . Heart disease Brother     History  Substance Use Topics  . Smoking status: Never Smoker   . Smokeless tobacco: Not on file  . Alcohol Use: No    Review of Systems   As noted in HPI  Filed Vitals:   10/16/13 0946  BP: 152/86  Pulse: 82  Temp: 98.4 F (36.9 C)    Physical Exam  Physical Exam  Constitutional: No distress.  Eyes: EOM are normal. Pupils are equal, round, and reactive to light.  Neck: Neck supple.  Cardiovascular: Normal rate and regular rhythm.   Pulmonary/Chest:  Breath sounds normal. She has no wheezes. She has no rales.  Abdominal:  Minimal epigastric tenderness no rebound or guarding bowel sounds positive  Musculoskeletal: She exhibits no edema.    CBC    Component Value Date/Time   WBC 5.3 03/18/2013 1446   RBC 4.95 03/18/2013 1446   HGB 14.4 03/18/2013 1446   HCT 42.1 03/18/2013 1446   PLT 332 03/18/2013 1446   MCV 85.1 03/18/2013 1446   LYMPHSABS 2.6 03/18/2013 1446   MONOABS 0.4 03/18/2013 1446   EOSABS 0.1 03/18/2013 1446   BASOSABS 0.0 03/18/2013 1446    CMP     Component Value Date/Time   NA 139 03/18/2013 1446   K 4.2 03/18/2013 1446   CL 104 03/18/2013 1446   CO2 25 03/18/2013 1446   GLUCOSE 115* 03/18/2013 1446   BUN 13 03/18/2013 1446   CREATININE 0.85 03/18/2013 1446   CREATININE 0.92 12/02/2009 2102   CALCIUM 9.5 03/18/2013 1446   PROT 7.3 03/18/2013 1446   ALBUMIN 4.1 03/18/2013 1446   AST 15 03/18/2013 1446   ALT 23 03/18/2013 1446   ALKPHOS 86  03/18/2013 1446   BILITOT 0.4 03/18/2013 1446   GFRNONAA 77 03/18/2013 1446   GFRNONAA >60 12/02/2009 2102   GFRAA 89 03/18/2013 1446   GFRAA  Value: >60        The eGFR has been calculated using the MDRD equation. This calculation has not been validated in all clinical situations. eGFR's persistently <60 mL/min signify possible Chronic Kidney Disease. 12/02/2009 2102    Lab Results  Component Value Date/Time   CHOL 210* 03/18/2013  2:46 PM    No components found with this basename: hga1c    Lab Results  Component Value Date/Time   AST 15 03/18/2013  2:46 PM    Assessment and Plan  Diabetes mellitus due to underlying condition without complications - Plan:  Results for orders placed in visit on 10/16/13  POCT GLYCOSYLATED HEMOGLOBIN (HGB A1C)      Result Value Ref Range   Hemoglobin A1C 7.0    GLUCOSE, POCT (MANUAL RESULT ENTRY)      Result Value Ref Range   POC Glucose 169 (*) 70 - 99 mg/dl   gB A1c is slightly improved, have advised patient for diet modification, continue with metformin, I have added glyburide, advise patient to keep the fingerstick log, bring on the next visit, Glucose (CBG), glyBURIDE (DIABETA) 2.5 MG tablet, COMPLETE METABOLIC PANEL WITH GFR, will check Lipid panel  Essential hypertension, benign - Plan: Blood pressure is uncontrolled, have increased the dose of lisinopril (PRINIVIL,ZESTRIL) 30 MG tablet, COMPLETE METABOLIC PANEL WITH GFR  Abdominal pain, epigastric - Plan: US Abdomen Complete  Screening - Plan: MM DIGITAL SCREENING BILATERAL  Special screening for malignant neoplasms, colon - Plan: Ambulatory referral to Gastroenterology   Health Maintenance Colonoscopy..  referred to GI -Mammogram: Ordered  Return in about 3 months (around 01/15/2014) for diabetes, hypertension.  Lorayne Marek, MD

## 2013-10-16 NOTE — Progress Notes (Signed)
Patient is here for a diabetes follow up. A1C and CBG done today. Patient is taking Metformin A1C 7.0 and CBG 169

## 2013-10-16 NOTE — Patient Instructions (Signed)
DASH Eating Plan DASH stands for "Dietary Approaches to Stop Hypertension." The DASH eating plan is a healthy eating plan that has been shown to reduce high blood pressure (hypertension). Additional health benefits may include reducing the risk of type 2 diabetes mellitus, heart disease, and stroke. The DASH eating plan may also help with weight loss. WHAT DO I NEED TO KNOW ABOUT THE DASH EATING PLAN? For the DASH eating plan, you will follow these general guidelines:  Choose foods with a percent daily value for sodium of less than 5% (as listed on the food label).  Use salt-free seasonings or herbs instead of table salt or sea salt.  Check with your health care provider or pharmacist before using salt substitutes.  Eat lower-sodium products, often labeled as "lower sodium" or "no salt added."  Eat fresh foods.  Eat more vegetables, fruits, and low-fat dairy products.  Choose whole grains. Look for the word "whole" as the first word in the ingredient list.  Choose fish and skinless chicken or turkey more often than red meat. Limit fish, poultry, and meat to 6 oz (170 g) each day.  Limit sweets, desserts, sugars, and sugary drinks.  Choose heart-healthy fats.  Limit cheese to 1 oz (28 g) per day.  Eat more home-cooked food and less restaurant, buffet, and fast food.  Limit fried foods.  Cook foods using methods other than frying.  Limit canned vegetables. If you do use them, rinse them well to decrease the sodium.  When eating at a restaurant, ask that your food be prepared with less salt, or no salt if possible. WHAT FOODS CAN I EAT? Seek help from a dietitian for individual calorie needs. Grains Whole grain or whole wheat bread. Brown rice. Whole grain or whole wheat pasta. Quinoa, bulgur, and whole grain cereals. Low-sodium cereals. Corn or whole wheat flour tortillas. Whole grain cornbread. Whole grain crackers. Low-sodium crackers. Vegetables Fresh or frozen vegetables  (raw, steamed, roasted, or grilled). Low-sodium or reduced-sodium tomato and vegetable juices. Low-sodium or reduced-sodium tomato sauce and paste. Low-sodium or reduced-sodium canned vegetables.  Fruits All fresh, canned (in natural juice), or frozen fruits. Meat and Other Protein Products Ground beef (85% or leaner), grass-fed beef, or beef trimmed of fat. Skinless chicken or turkey. Ground chicken or turkey. Pork trimmed of fat. All fish and seafood. Eggs. Dried beans, peas, or lentils. Unsalted nuts and seeds. Unsalted canned beans. Dairy Low-fat dairy products, such as skim or 1% milk, 2% or reduced-fat cheeses, low-fat ricotta or cottage cheese, or plain low-fat yogurt. Low-sodium or reduced-sodium cheeses. Fats and Oils Tub margarines without trans fats. Light or reduced-fat mayonnaise and salad dressings (reduced sodium). Avocado. Safflower, olive, or canola oils. Natural peanut or almond butter. Other Unsalted popcorn and pretzels. The items listed above may not be a complete list of recommended foods or beverages. Contact your dietitian for more options. WHAT FOODS ARE NOT RECOMMENDED? Grains White bread. White pasta. White rice. Refined cornbread. Bagels and croissants. Crackers that contain trans fat. Vegetables Creamed or fried vegetables. Vegetables in a cheese sauce. Regular canned vegetables. Regular canned tomato sauce and paste. Regular tomato and vegetable juices. Fruits Dried fruits. Canned fruit in light or heavy syrup. Fruit juice. Meat and Other Protein Products Fatty cuts of meat. Ribs, chicken wings, bacon, sausage, bologna, salami, chitterlings, fatback, hot dogs, bratwurst, and packaged luncheon meats. Salted nuts and seeds. Canned beans with salt. Dairy Whole or 2% milk, cream, half-and-half, and cream cheese. Whole-fat or sweetened yogurt. Full-fat   cheeses or blue cheese. Nondairy creamers and whipped toppings. Processed cheese, cheese spreads, or cheese  curds. Condiments Onion and garlic salt, seasoned salt, table salt, and sea salt. Canned and packaged gravies. Worcestershire sauce. Tartar sauce. Barbecue sauce. Teriyaki sauce. Soy sauce, including reduced sodium. Steak sauce. Fish sauce. Oyster sauce. Cocktail sauce. Horseradish. Ketchup and mustard. Meat flavorings and tenderizers. Bouillon cubes. Hot sauce. Tabasco sauce. Marinades. Taco seasonings. Relishes. Fats and Oils Butter, stick margarine, lard, shortening, ghee, and bacon fat. Coconut, palm kernel, or palm oils. Regular salad dressings. Other Pickles and olives. Salted popcorn and pretzels. The items listed above may not be a complete list of foods and beverages to avoid. Contact your dietitian for more information. WHERE CAN I FIND MORE INFORMATION? National Heart, [employer], [employer] Institute: www.nhlbi.nih.gov/health/health-topics/topics/dash/ Document Released: 01/12/2011 Document Revised: 06/09/2013 Document Reviewed: 11/27/2012 ExitCare Patient Information 2015 [employer]. This information is not intended to replace advice given to you by your health care provider. Make sure you discuss any questions you have with your health care provider. Diabetes Mellitus and Food It is important for you to manage your blood sugar (glucose) level. Your blood glucose level can be greatly affected by what you eat. Eating healthier foods in the appropriate amounts throughout the day at about the same time each day will help you control your blood glucose level. It can also help slow or prevent worsening of your diabetes mellitus. Healthy eating may even help you improve the level of your blood pressure and reach or maintain a healthy weight.  HOW CAN FOOD AFFECT ME? Carbohydrates Carbohydrates affect your blood glucose level more than any other type of food. Your dietitian will help you determine how many carbohydrates to eat at each meal and teach you how to count carbohydrates. Counting  carbohydrates is important to keep your blood glucose at a healthy level, especially if you are using insulin or taking certain medicines for diabetes mellitus. Alcohol Alcohol can cause sudden decreases in blood glucose (hypoglycemia), especially if you use insulin or take certain medicines for diabetes mellitus. Hypoglycemia can be a life-threatening condition. Symptoms of hypoglycemia (sleepiness, dizziness, and disorientation) are similar to symptoms of having too much alcohol.  If your health care provider has given you approval to drink alcohol, do so in moderation and use the following guidelines:  Women should not have more than one drink per day, and men should not have more than two drinks per day. One drink is equal to:  12 oz of beer.  5 oz of wine.  1 oz of hard liquor.  Do not drink on an empty stomach.  Keep yourself hydrated. Have water, diet soda, or unsweetened iced tea.  Regular soda, juice, and other mixers might contain a lot of carbohydrates and should be counted. WHAT FOODS ARE NOT RECOMMENDED? As you make food choices, it is important to remember that all foods are not the same. Some foods have fewer nutrients per serving than other foods, even though they might have the same number of calories or carbohydrates. It is difficult to get your body what it needs when you eat foods with fewer nutrients. Examples of foods that you should avoid that are high in calories and carbohydrates but low in nutrients include:  Trans fats (most processed foods list trans fats on the Nutrition Facts label).  Regular soda.  Juice.  Candy.  Sweets, such as cake, pie, doughnuts, and cookies.  Fried foods. WHAT FOODS CAN I EAT? Have nutrient-rich foods,   which will nourish your body and keep you healthy. The food you should eat also will depend on several factors, including:  The calories you need.  The medicines you take.  Your weight.  Your blood glucose level.  Your  blood pressure level.  Your cholesterol level. You also should eat a variety of foods, including:  Protein, such as meat, poultry, fish, tofu, nuts, and seeds (lean animal proteins are best).  Fruits.  Vegetables.  Dairy products, such as milk, cheese, and yogurt (low fat is best).  Breads, grains, pasta, cereal, rice, and beans.  Fats such as olive oil, trans fat-free margarine, canola oil, avocado, and olives. DOES EVERYONE WITH DIABETES MELLITUS HAVE THE SAME MEAL PLAN? Because every person with diabetes mellitus is different, there is not one meal plan that works for everyone. It is very important that you meet with a dietitian who will help you create a meal plan that is just right for you. Document Released: 10/20/2004 Document Revised: 01/28/2013 Document Reviewed: 12/20/2012 Exit[employer] Patient Information 2015 [employer]. This information is not intended to replace advice given to you by your health care provider. Make sure you discuss any questions you have with your health care provider.  

## 2013-10-17 ENCOUNTER — Telehealth: Payer: Self-pay | Admitting: Internal Medicine

## 2013-10-17 ENCOUNTER — Telehealth: Payer: Self-pay | Admitting: Emergency Medicine

## 2013-10-17 MED ORDER — ATORVASTATIN CALCIUM 20 MG PO TABS: 20.0000 mg | ORAL_TABLET | Freq: Every day | ORAL | Status: DC

## 2013-10-17 NOTE — Telephone Encounter (Signed)
Left message for pt to call for lab results Medication Lipitor 20 mg tab ordered and e-scribed to [facility]

## 2013-10-17 NOTE — Telephone Encounter (Signed)
Message copied by Ricci Barker on Fri Oct 17, 2013  2:10 PM ------      Message from: Lorayne Marek      Created: Fri Oct 17, 2013 10:52 AM       Blood work reviewed  noticed LDL>100, since patient is diabetic will start on statins, advise patient to start taking Lipitor 20 mg daily.       ------

## 2013-10-17 NOTE — Telephone Encounter (Signed)
Pt. Returning nurses call. Please f/u with pt.

## 2013-10-21 NOTE — Telephone Encounter (Signed)
Pt. Called again to speak to nurse about results. Please f/u with pt.

## 2013-10-27 ENCOUNTER — Encounter (HOSPITAL_COMMUNITY): Payer: Self-pay | Admitting: Emergency Medicine

## 2013-10-27 ENCOUNTER — Emergency Department (HOSPITAL_COMMUNITY): Payer: Self-pay

## 2013-10-27 ENCOUNTER — Telehealth: Payer: Self-pay | Admitting: Internal Medicine

## 2013-10-27 ENCOUNTER — Emergency Department (HOSPITAL_COMMUNITY)
Admission: EM | Admit: 2013-10-27 | Discharge: 2013-10-27 | Disposition: A | Discharge status: Home / Self Care | Payer: Self-pay | Attending: Emergency Medicine | Admitting: Emergency Medicine

## 2013-10-27 ENCOUNTER — Other Ambulatory Visit: Payer: Self-pay

## 2013-10-27 DIAGNOSIS — E119 Type 2 diabetes mellitus without complications: Secondary | ICD-10-CM | POA: Insufficient documentation

## 2013-10-27 DIAGNOSIS — R079 Chest pain, unspecified: Secondary | ICD-10-CM | POA: Insufficient documentation

## 2013-10-27 DIAGNOSIS — I10 Essential (primary) hypertension: Secondary | ICD-10-CM | POA: Insufficient documentation

## 2013-10-27 DIAGNOSIS — F419 Anxiety disorder, unspecified: Secondary | ICD-10-CM

## 2013-10-27 DIAGNOSIS — Z880 Allergy status to penicillin: Secondary | ICD-10-CM | POA: Insufficient documentation

## 2013-10-27 DIAGNOSIS — F411 Generalized anxiety disorder: Secondary | ICD-10-CM | POA: Insufficient documentation

## 2013-10-27 DIAGNOSIS — Z79899 Other long term (current) drug therapy: Secondary | ICD-10-CM | POA: Insufficient documentation

## 2013-10-27 DIAGNOSIS — R0789 Other chest pain: Secondary | ICD-10-CM | POA: Insufficient documentation

## 2013-10-27 LAB — BASIC METABOLIC PANEL
Anion gap: 13 (ref 5–15)
BUN: 13 mg/dL (ref 6–23)
CALCIUM: 9 mg/dL (ref 8.4–10.5)
CO2: 24 mEq/L (ref 19–32)
CREATININE: 0.76 mg/dL (ref 0.50–1.10)
Chloride: 106 mEq/L (ref 96–112)
GFR calc non Af Amer: >90 mL/min (ref >90)
Glucose, Bld: 131 mg/dL — ABNORMAL HIGH (ref 70–99)
Potassium: 3.9 mEq/L (ref 3.7–5.3)
Sodium: 143 mEq/L (ref 137–147)

## 2013-10-27 LAB — CBC
HCT: 39.8 % (ref 36.0–46.0)
Hemoglobin: 13.3 g/dL (ref 12.0–15.0)
MCH: 27.7 pg (ref 26.0–34.0)
MCHC: 33.4 g/dL (ref 30.0–36.0)
MCV: 82.7 fL (ref 78.0–100.0)
Platelets: 321 10*3/uL (ref 150–400)
RBC: 4.81 MIL/uL (ref 3.87–5.11)
RDW: 13.9 % (ref 11.5–15.5)
WBC: 6.4 10*3/uL (ref 4.0–10.5)

## 2013-10-27 LAB — HEPATIC FUNCTION PANEL
ALBUMIN: 3.6 g/dL (ref 3.5–5.2)
ALK PHOS: 96 U/L (ref 39–117)
ALT: 25 U/L (ref 0–35)
AST: 21 U/L (ref 0–37)
Bilirubin, Direct: <0.2 mg/dL (ref 0.0–0.3)
Total Protein: 7.1 g/dL (ref 6.0–8.3)

## 2013-10-27 LAB — D-DIMER, QUANTITATIVE: D-Dimer, Quant: 0.48 ug/mL-FEU (ref 0.00–0.48)

## 2013-10-27 LAB — I-STAT TROPONIN, ED: Troponin i, poc: 0.01 ng/mL (ref 0.00–0.08)

## 2013-10-27 LAB — PRO B NATRIURETIC PEPTIDE: PRO B NATRI PEPTIDE: 42.6 pg/mL (ref 0–125)

## 2013-10-27 LAB — TROPONIN I: Troponin I: <0.3 ng/mL (ref <0.30)

## 2013-10-27 IMAGING — CR DG CHEST 2V
2 series · 2 of 2 positions shown · non-contrast
Comparison: Chest x-ray [DATE].

CLINICAL DATA: Chest pain and nausea.

EXAM:
CHEST  2 VIEW

[w chest pa]
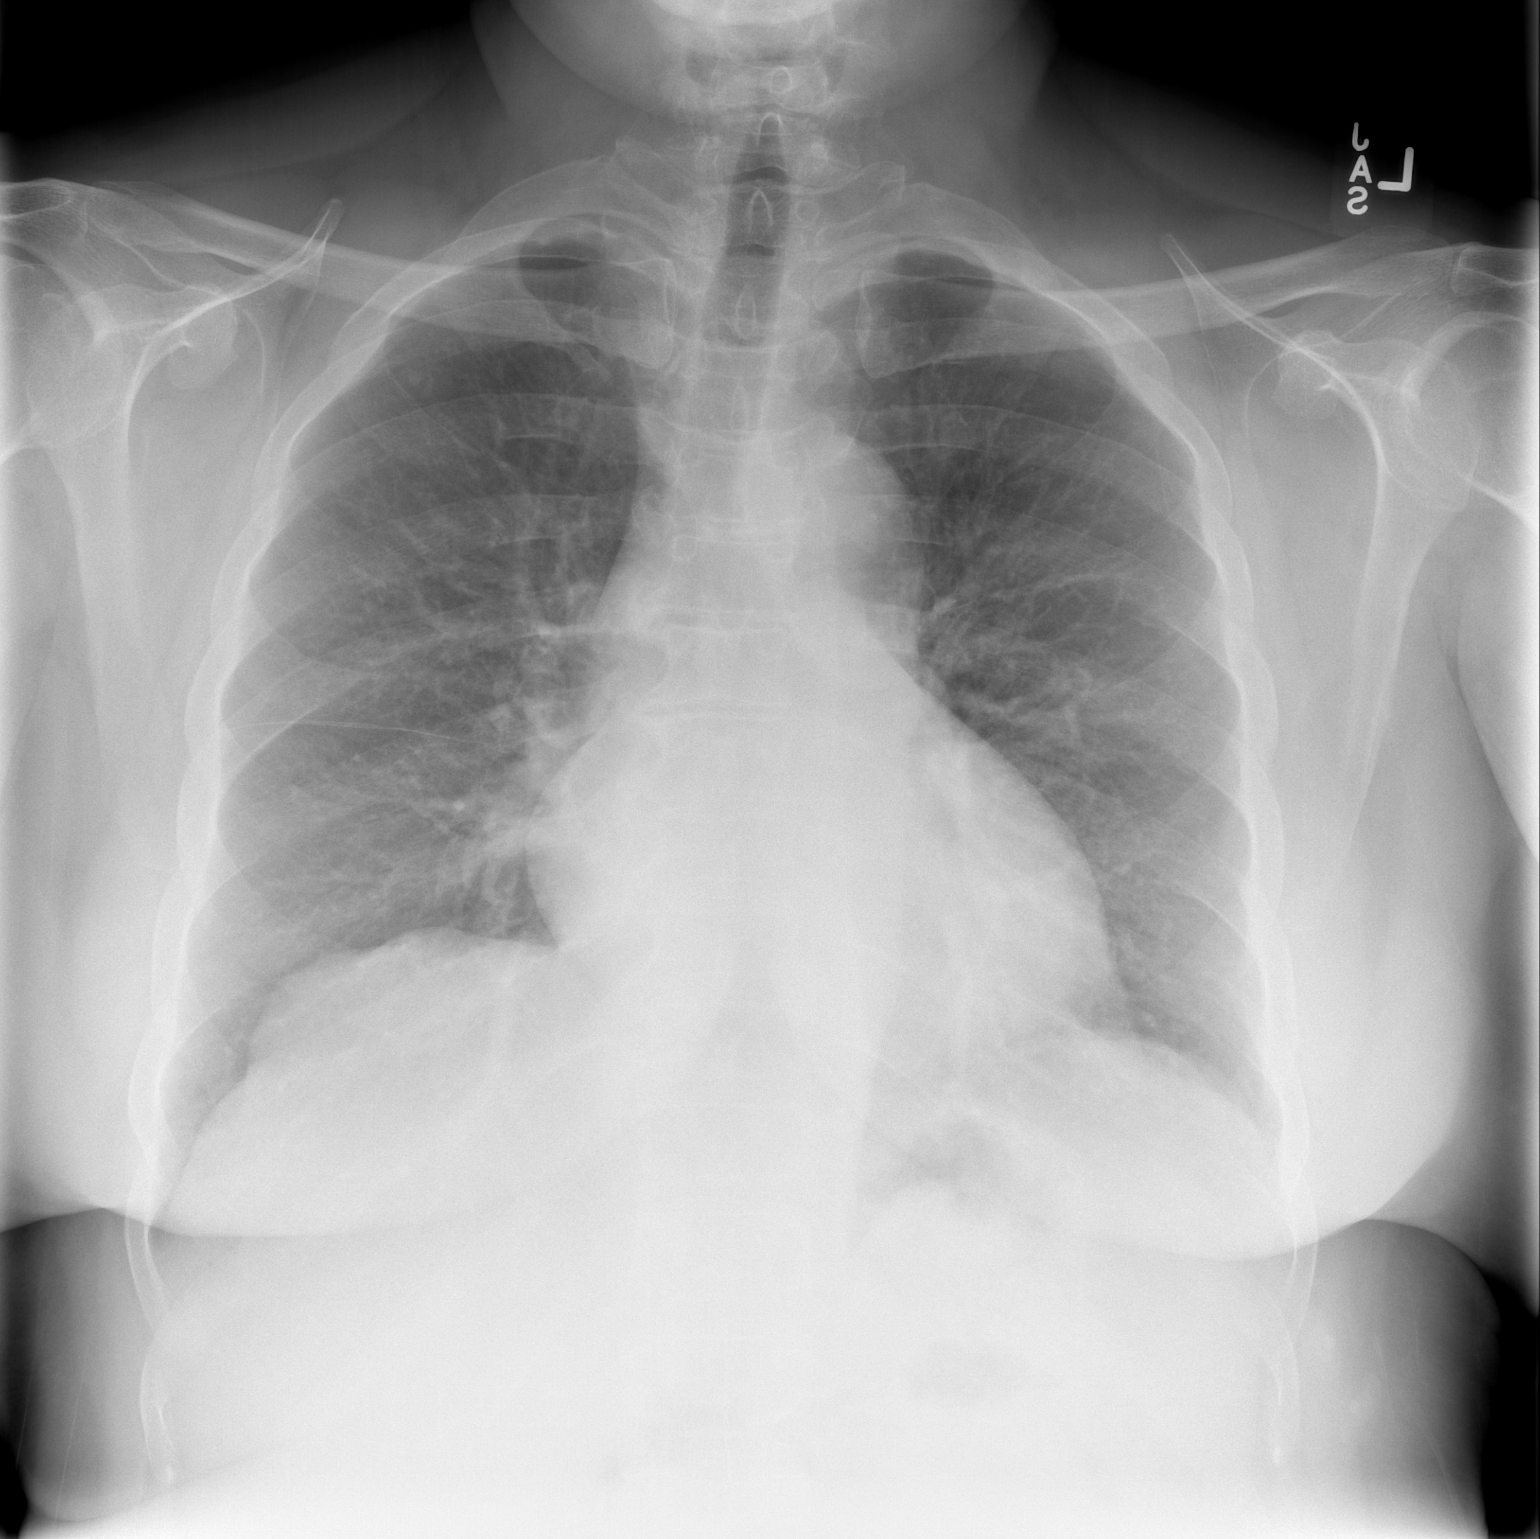

[w chest lat]
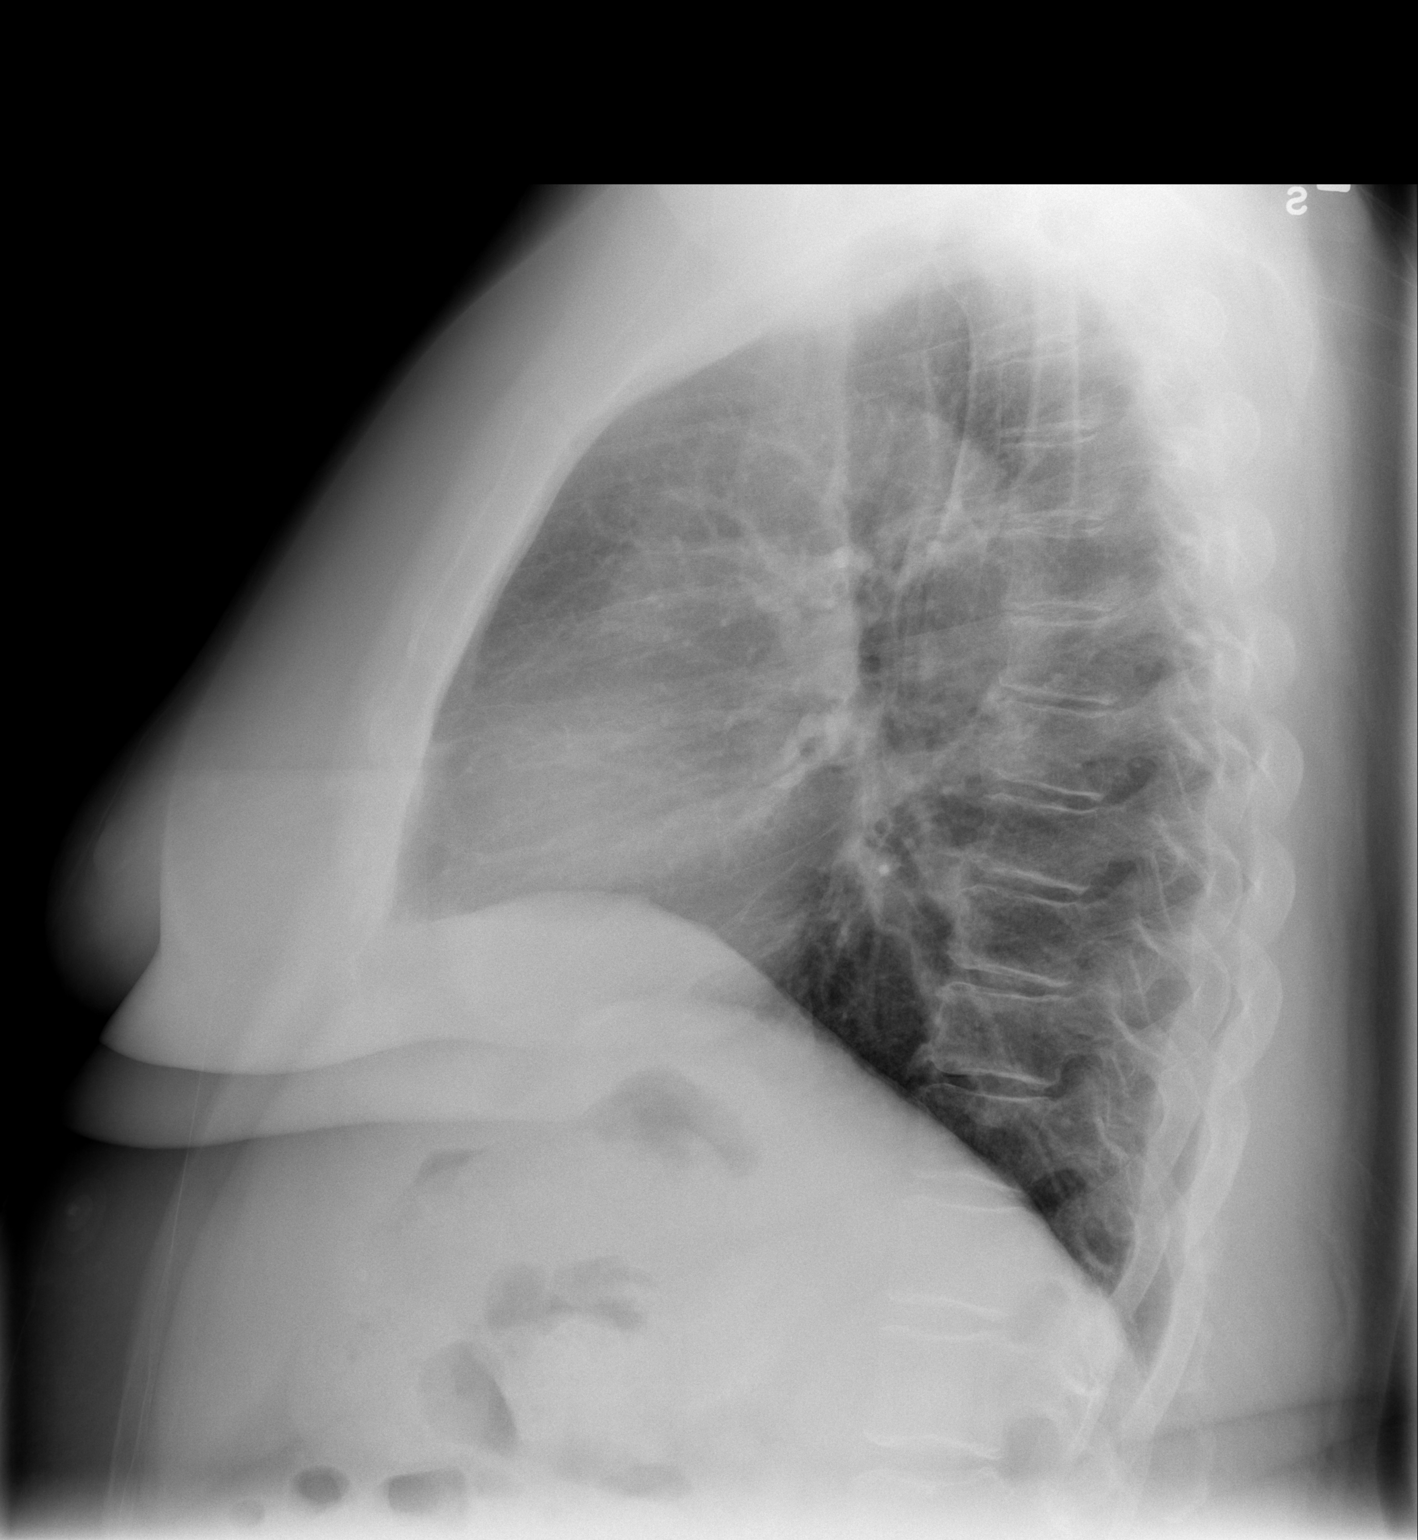

[2 of 2 positions shown; findings below may reference images not displayed]

FINDINGS: Small partially calcified nodule in the right upper lobe is similar
to remote prior examination, compatible with a a granuloma. Lung
volumes are normal. No consolidative airspace disease. No pleural
effusions. No pneumothorax. No pulmonary nodule or mass noted.
Pulmonary vasculature and the cardiomediastinal silhouette are
within normal limits.
IMPRESSION: No radiographic evidence of acute cardiopulmonary disease.

## 2013-10-27 MED ORDER — ASPIRIN 325 MG PO TABS
325.0000 mg | ORAL_TABLET | Freq: Once | ORAL | Status: AC
  Administered 2013-10-27: 325 mg via ORAL
  Filled 2013-10-27: qty 1

## 2013-10-27 MED ORDER — SODIUM CHLORIDE 0.9 % IV BOLUS (SEPSIS)
500.0000 mL | Freq: Once | INTRAVENOUS | Status: AC
  Administered 2013-10-27: 500 mL via INTRAVENOUS

## 2013-10-27 NOTE — ED Notes (Signed)
Pt states she is been having mid CP intermittent specially at night time. Taking deep breath make her chest hurt more.

## 2013-10-27 NOTE — Telephone Encounter (Signed)
Pt is requesting a note from the dr so that she can return to work with no restrictions today. Please fax this note to [phone]. Please follow up with pt.

## 2013-10-27 NOTE — ED Provider Notes (Signed)
CSN: [id number]     Arrival date & time 10/27/13  1714 History   First MD Initiated Contact with Patient 10/27/13 2112     Chief Complaint  Patient presents with  . Chest Pain     (Consider location/radiation/quality/duration/timing/severity/associated sxs/prior Treatment) The history is provided by the patient. No language interpreter was used.  Megan Lane is a 58 y/o F with PMHx of HTN, DM, abdominal hysterectomy presenting to the ED with chest pain that has been ongoing for the past month. Patient reported that her chest pain is localized to the center of the chest described as a sharp pain, "needle poking" that is intermittent lasting only a couple of seconds. Patient reported that the pain increases with deep inhalation. Patient reported that once she relaxes and calms herself down the pain goes away. Stated that these episodes of chest pain are associated with patient being stressed - patient has custody of 3 great-grandchildren ages 83, 52, and 36. Stated that she is concerned and worries about her granddaughter everyday. Stated that when she gets overwhelmed and stressed out her blood pressure increases and that she starts to experience the chest discomfort. Stated that she was seen and assessed by her PCP and her dose of blood pressure medications were increased, but she stated that she has not picked up her medications just yet. Denied vomiting, diarrhea, abdominal pain, fever, chills, diaphoresis, shortness of breath, difficulty breathing, weakness, numbness, tingling, loss of sensation, fall, injury, travel, leg swelling, neck pain, neck stiffness, cough, hemoptysis, syncope, dizziness, headaches.  PCP Dr. Annitta Needs  Past Medical History  Diagnosis Date  . Hypertension   . Diabetes mellitus    Past Surgical History  Procedure Laterality Date  . Knee surgery    . Abdominal hysterectomy     Family History  Problem Relation Age of Onset  . Diabetes Mother   . Hypertension Mother    . Stroke Mother   . Heart disease Brother    History  Substance Use Topics  . Smoking status: Never Smoker   . Smokeless tobacco: Not on file  . Alcohol Use: No   OB History   Grav Para Term Preterm Abortions TAB SAB Ect Mult Living                 Review of Systems  Constitutional: Negative for fever and chills.  Eyes: Negative for visual disturbance.  Respiratory: Negative for cough, chest tightness and shortness of breath.   Cardiovascular: Positive for chest pain. Negative for leg swelling.  Gastrointestinal: Negative for nausea and vomiting.  Neurological: Negative for dizziness, weakness, numbness and headaches.      Allergies  Penicillins  Home Medications   Prior to Admission medications   Medication Sig Start Date End Date Taking? Authorizing Provider  atorvastatin (LIPITOR) 20 MG tablet Take 1 tablet (20 mg total) by mouth daily. 10/17/13   Lorayne Marek, MD  benzonatate (TESSALON) 100 MG capsule Take 1 capsule (100 mg total) by mouth 3 (three) times daily as needed for cough. 04/30/13   Lorayne Marek, MD  glyBURIDE (DIABETA) 2.5 MG tablet Take 1 tablet (2.5 mg total) by mouth 2 (two) times daily with a meal. 10/16/13   Lorayne Marek, MD  lisinopril (PRINIVIL,ZESTRIL) 30 MG tablet Take 1 tablet (30 mg total) by mouth daily. 10/16/13   Lorayne Marek, MD  metFORMIN (GLUCOPHAGE) 1000 MG tablet Take 1 tablet (1,000 mg total) by mouth 2 (two) times daily with a meal. 06/09/13   Deepak  Advani, MD   BP 138/70  Pulse 68  Temp(Src) 98.3 F (36.8 C) (Oral)  Resp 18  SpO2 100% Physical Exam  Nursing note and vitals reviewed. Constitutional: She is oriented to person, place, and time. She appears well-developed and well-nourished. No distress.  HENT:  Head: Normocephalic and atraumatic.  Mouth/Throat: Oropharynx is clear and moist. No oropharyngeal exudate.  Eyes: Conjunctivae and EOM are normal. Pupils are equal, round, and reactive to light. Right eye exhibits no  discharge. Left eye exhibits no discharge.  Neck: Normal range of motion. Neck supple. No tracheal deviation present.  Cardiovascular: Normal rate, regular rhythm and normal heart sounds.  Exam reveals no friction rub.   No murmur heard. Pulses:      Radial pulses are 2+ on the right side, and 2+ on the left side.       Dorsalis pedis pulses are 2+ on the right side, and 2+ on the left side.  Cap refill < 3 seconds Negative swelling or pitting edema noted to the lower extremities bilaterally   Pulmonary/Chest: Effort normal and breath sounds normal. No respiratory distress. She has no wheezes. She has no rales. She exhibits tenderness.  Patient is able to speak in full sentences without difficulty  Negative use of accessory muscles  Negative stridor  Discomfort upon palpation to the center of the chest wall - pain reproducible upon palpation   Musculoskeletal: Normal range of motion. She exhibits no edema and no tenderness.  Full ROM to upper and lower extremities without difficulty noted, negative ataxia noted.  Lymphadenopathy:    She has no cervical adenopathy.  Neurological: She is alert and oriented to person, place, and time. No cranial nerve deficit. She exhibits normal muscle tone. Coordination normal.  Cranial nerves III-XII grossly intact Strength 5+/5+ to upper and lower extremities bilaterally with resistance applied, equal distribution noted Equal grip strength bilaterally  Negative slurred speech  Negative aphasia Negative facial drooping  Negative arm drift Fine motor skills intact  Skin: Skin is warm and dry. No rash noted. She is not diaphoretic. No erythema.  Psychiatric: She has a normal mood and affect. Her behavior is normal. Thought content normal.    ED Course  Procedures (including critical care time)  Results for orders placed during the hospital encounter of 10/27/13  CBC      Result Value Ref Range   WBC 6.4  4.0 - 10.5 K/uL   RBC 4.81  3.87 - 5.11  MIL/uL   Hemoglobin 13.3  12.0 - 15.0 g/dL   HCT 39.8  36.0 - 46.0 %   MCV 82.7  78.0 - 100.0 fL   MCH 27.7  26.0 - 34.0 pg   MCHC 33.4  30.0 - 36.0 g/dL   RDW 13.9  11.5 - 15.5 %   Platelets 321  150 - 400 K/uL  BASIC METABOLIC PANEL      Result Value Ref Range   Sodium 143  137 - 147 mEq/L   Potassium 3.9  3.7 - 5.3 mEq/L   Chloride 106  96 - 112 mEq/L   CO2 24  19 - 32 mEq/L   Glucose, Bld 131 (*) 70 - 99 mg/dL   BUN 13  6 - 23 mg/dL   Creatinine, Ser 0.76  0.50 - 1.10 mg/dL   Calcium 9.0  8.4 - 10.5 mg/dL   GFR calc non Af Amer >90  >90 mL/min   GFR calc Af Amer >90  >90 mL/min   Anion  gap 13  5 - 15  HEPATIC FUNCTION PANEL      Result Value Ref Range   Total Protein 7.1  6.0 - 8.3 g/dL   Albumin 3.6  3.5 - 5.2 g/dL   AST 21  0 - 37 U/L   ALT 25  0 - 35 U/L   Alkaline Phosphatase 96  39 - 117 U/L   Total Bilirubin <0.2 (*) 0.3 - 1.2 mg/dL   Bilirubin, Direct <0.2  0.0 - 0.3 mg/dL   Indirect Bilirubin NOT CALCULATED  0.3 - 0.9 mg/dL  PRO B NATRIURETIC PEPTIDE      Result Value Ref Range   Pro B Natriuretic peptide (BNP) 42.6  0 - 125 pg/mL  D-DIMER, QUANTITATIVE      Result Value Ref Range   D-Dimer, Quant 0.48  0.00 - 0.48 ug/mL-FEU  TROPONIN I      Result Value Ref Range   Troponin I <0.30  <0.30 ng/mL  I-STAT TROPOININ, ED      Result Value Ref Range   Troponin i, poc 0.01  0.00 - 0.08 ng/mL   Comment 3             Labs Review Labs Reviewed  BASIC METABOLIC PANEL - Abnormal; Notable for the following:    Glucose, Bld 131 (*)    All other components within normal limits  HEPATIC FUNCTION PANEL - Abnormal; Notable for the following:    Total Bilirubin <0.2 (*)    All other components within normal limits  CBC  PRO B NATRIURETIC PEPTIDE  D-DIMER, QUANTITATIVE  TROPONIN I  Randolm Idol, ED    Imaging Review Dg Chest 2 View  10/27/2013   CLINICAL DATA:  Chest pain and nausea.  EXAM: CHEST  2 VIEW  COMPARISON:  Chest x-ray 12/03/2009.  FINDINGS:  Small partially calcified nodule in the right upper lobe is similar to remote prior examination, compatible with a a granuloma. Lung volumes are normal. No consolidative airspace disease. No pleural effusions. No pneumothorax. No pulmonary nodule or mass noted. Pulmonary vasculature and the cardiomediastinal silhouette are within normal limits.  IMPRESSION: No radiographic evidence of acute cardiopulmonary disease.   Electronically Signed   By: Vinnie Langton M.D.   On: 10/27/2013 19:09     EKG Interpretation   Date/Time:  Monday October 27 2013 17:21:20 EDT Ventricular Rate:  91 PR Interval:  142 QRS Duration: 82 QT Interval:  378 QTC Calculation: 464 R Axis:   -11 Text Interpretation:  Normal sinus rhythm Normal ECG Confirmed by Jeneen Rinks   MD, [place] ([id number]) on 10/27/2013 10:19:01 PM      MDM   Final diagnoses:  Chest pain, unspecified chest pain type  Anxiety    Medications  aspirin tablet 325 mg (325 mg Oral Given 10/27/13 2216)  sodium chloride 0.9 % bolus 500 mL (500 mLs Intravenous New Bag/Given 10/27/13 2213)   Filed Vitals:   10/27/13 2121 10/27/13 2130 10/27/13 2200 10/27/13 2230  BP: 144/88 126/90 127/79 138/70  Pulse: 96 58 66 68  Temp: 98.3 F (36.8 C)     TempSrc: Oral     Resp: 12 18 17 18   SpO2: 100% 98% 98% 100%    EKG noted normal sinus rhythm with a heart rate of 91 beats per minute. I-STAT troponin negative elevation. Second troponin negative elevation. BNP negative elevation. D-dimer negative elevation. CBC unremarkable. BMP unremarkable-negative anion gap, 13.0 mg/L. Hepatic function panel unremarkable. Chest x-ray negative for acute cardiopulmonary disease. Doubt  PE. Doubt pneumonia. Doubt ACS-chest pain has been ongoing for the past month with negative elevation of troponin. Negative findings of end organ damage-kidney function well. Negative findings of DKA. Suspicion to be possible anxiety. Discussed case in great detail with attending physician, Dr.  Tanna Furry who agree to plan of discharge for patient to followup with primary care provider. Patient stable, afebrile. Patient not septic appearing. Discharged patient. Discharge patient with referral to primary care provider and cardiology. Discussed patient to rest and stay hydrated. Discussed with patient proper ways to decrease anxiety. Discussed with patient to closely monitor symptoms and if symptoms are to worsen or change to report back to the ED - strict return instructions given.  Patient agreed to plan of care, understood, all questions answered.    Jamse Mead, PA-C 10/27/13 [place], PA-C 10/27/13 2328

## 2013-10-27 NOTE — Discharge Instructions (Signed)
Please call your doctor for a followup appointment within 24-48 hours. When you talk to your doctor please let them know that you were seen in the emergency department and have them acquire all of your records so that they can discuss the findings with you and formulate a treatment plan to fully care for your new and ongoing problems. Please call and set-up an appointment with your primary care provider to be re-assessed this week Please rest and stay hydrated Please continue to monitor symptoms closely and if symptoms are to worsen or change (fever greater than 101, chills, sweating, nausea, vomiting, chest pain, shortness of breathe, difficulty breathing, weakness, numbness, tingling, worsening or changes to pain pattern, leg swelling, pain running down left arm, weakness, fatigue, sweating) please report back to the Emergency Department immediately.    Chest Pain (Nonspecific) It is often hard to give a specific diagnosis for the cause of chest pain. There is always a chance that your pain could be related to something serious, such as a heart attack or a blood clot in the lungs. You need to follow up with your health care provider for further evaluation. CAUSES   Heartburn.  Pneumonia or bronchitis.  Anxiety or stress.  Inflammation around your heart (pericarditis) or lung (pleuritis or pleurisy).  A blood clot in the lung.  A collapsed lung (pneumothorax). It can develop suddenly on its own (spontaneous pneumothorax) or from trauma to the chest.  Shingles infection (herpes zoster virus). The chest wall is composed of bones, muscles, and cartilage. Any of these can be the source of the pain.  The bones can be bruised by injury.  The muscles or cartilage can be strained by coughing or overwork.  The cartilage can be affected by inflammation and become sore (costochondritis). DIAGNOSIS  Lab tests or other studies may be needed to find the cause of your pain. Your health care provider  may have you take a test called an ambulatory electrocardiogram (ECG). An ECG records your heartbeat patterns over a 24-hour period. You may also have other tests, such as:  Transthoracic echocardiogram (TTE). During echocardiography, sound waves are used to evaluate how blood flows through your heart.  Transesophageal echocardiogram (TEE).  Cardiac monitoring. This allows your health care provider to monitor your heart rate and rhythm in real time.  Holter monitor. This is a portable device that records your heartbeat and can help diagnose heart arrhythmias. It allows your health care provider to track your heart activity for several days, if needed.  Stress tests by exercise or by giving medicine that makes the heart beat faster. TREATMENT   Treatment depends on what may be causing your chest pain. Treatment may include:  Acid blockers for heartburn.  Anti-inflammatory medicine.  Pain medicine for inflammatory conditions.  Antibiotics if an infection is present.  You may be advised to change lifestyle habits. This includes stopping smoking and avoiding alcohol, caffeine, and chocolate.  You may be advised to keep your head raised (elevated) when sleeping. This reduces the chance of acid going backward from your stomach into your esophagus. Most of the time, nonspecific chest pain will improve within 2-3 days with rest and mild pain medicine.  HOME CARE INSTRUCTIONS   If antibiotics were prescribed, take them as directed. Finish them even if you start to feel better.  For the next few days, avoid physical activities that bring on chest pain. Continue physical activities as directed.  Do not use any tobacco products, including cigarettes, chewing tobacco,  or electronic cigarettes.  Avoid drinking alcohol.  Only take medicine as directed by your health care provider.  Follow your health care provider's suggestions for further testing if your chest pain does not go away.  Keep  any follow-up appointments you made. If you do not go to an appointment, you could develop lasting (chronic) problems with pain. If there is any problem keeping an appointment, call to reschedule. SEEK MEDICAL CARE IF:   Your chest pain does not go away, even after treatment.  You have a rash with blisters on your chest.  You have a fever. SEEK IMMEDIATE MEDICAL CARE IF:   You have increased chest pain or pain that spreads to your arm, neck, jaw, back, or abdomen.  You have shortness of breath.  You have an increasing cough, or you cough up blood.  You have severe back or abdominal pain.  You feel nauseous or vomit.  You have severe weakness.  You faint.  You have chills. This is an emergency. Do not wait to see if the pain will go away. Get medical help at once. Call your local emergency services (911 in [place] Do not drive yourself to the hospital. MAKE SURE YOU:   Understand these instructions.  Will watch your condition.  Will get help right away if you are not doing well or get worse. Document Released: 11/02/2004 Document Revised: 01/28/2013 Document Reviewed: 08/29/2007 [facility] Patient Information 2015 [place], [place]. This information is not intended to replace advice given to you by your health care provider. Make sure you discuss any questions you have with your health care provider.

## 2013-10-27 NOTE — ED Notes (Signed)
Apologized to pt for wait time. Pt denies any pain at this time. NAD VSS

## 2013-10-27 NOTE — Telephone Encounter (Signed)
Patient is asking for a Job's note from her PCP because she couldn't go to work yesterday since her blood pressure was very high and she was dizzy. Please f/u with Patient ASAP.

## 2013-10-27 NOTE — Telephone Encounter (Signed)
Pt requesting return to work note with no restrictions. Please f/u

## 2013-10-27 NOTE — ED Notes (Signed)
Pt reports intermittent central "sharp" chest pain since yesterday with nausea and mild lightheadedness. Denies pain at this time. Pt denies SOB. Pt in NAD. AO x4.

## 2013-10-28 ENCOUNTER — Telehealth: Payer: Self-pay

## 2013-10-28 NOTE — Telephone Encounter (Signed)
Patient states was seen in the ED yesterday for her blood pressure ED gave her a note to return to work

## 2013-10-30 NOTE — ED Provider Notes (Signed)
Medical screening examination/treatment/procedure(s) were performed by non-physician practitioner and as supervising physician I was immediately available for consultation/collaboration.   EKG Interpretation   Date/Time:  Monday October 27 2013 21:26:41 EDT Ventricular Rate:  73 PR Interval:  158 QRS Duration: 92 QT Interval:  424 QTC Calculation: 467 R Axis:   -4 Text Interpretation:  Sinus rhythm ED PHYSICIAN INTERPRETATION AVAILABLE  IN [place] Confirmed by TEST, Record ([id number]) on 10/29/2013 7:04:03  AM        Tanna Furry, MD 10/30/13 [id number]

## 2013-11-20 ENCOUNTER — Ambulatory Visit: Payer: Self-pay

## 2013-11-21 ENCOUNTER — Encounter: Payer: Self-pay | Admitting: Internal Medicine

## 2014-02-27 ENCOUNTER — Ambulatory Visit: Payer: Self-pay

## 2014-08-03 ENCOUNTER — Ambulatory Visit: Payer: Self-pay | Attending: Internal Medicine

## 2014-08-12 ENCOUNTER — Ambulatory Visit: Payer: Self-pay | Attending: Internal Medicine | Admitting: Internal Medicine

## 2014-08-12 ENCOUNTER — Other Ambulatory Visit: Payer: Self-pay | Admitting: Pharmacist

## 2014-08-12 ENCOUNTER — Encounter: Payer: Self-pay | Admitting: Internal Medicine

## 2014-08-12 VITALS — BP 145/91 | HR 72 | Temp 98.2°F | Resp 18 | Ht 72.0 in | Wt 229.4 lb

## 2014-08-12 DIAGNOSIS — I10 Essential (primary) hypertension: Secondary | ICD-10-CM

## 2014-08-12 DIAGNOSIS — R1013 Epigastric pain: Secondary | ICD-10-CM | POA: Insufficient documentation

## 2014-08-12 DIAGNOSIS — Z1231 Encounter for screening mammogram for malignant neoplasm of breast: Secondary | ICD-10-CM

## 2014-08-12 DIAGNOSIS — Z79899 Other long term (current) drug therapy: Secondary | ICD-10-CM | POA: Insufficient documentation

## 2014-08-12 DIAGNOSIS — Z87891 Personal history of nicotine dependence: Secondary | ICD-10-CM | POA: Insufficient documentation

## 2014-08-12 DIAGNOSIS — E139 Other specified diabetes mellitus without complications: Secondary | ICD-10-CM

## 2014-08-12 DIAGNOSIS — E119 Type 2 diabetes mellitus without complications: Secondary | ICD-10-CM | POA: Insufficient documentation

## 2014-08-12 DIAGNOSIS — E785 Hyperlipidemia, unspecified: Secondary | ICD-10-CM | POA: Insufficient documentation

## 2014-08-12 LAB — COMPLETE METABOLIC PANEL WITH GFR
ALBUMIN: 3.7 g/dL (ref 3.5–5.2)
ALT: 20 U/L (ref 0–35)
AST: 14 U/L (ref 0–37)
Alkaline Phosphatase: 88 U/L (ref 39–117)
BUN: 13 mg/dL (ref 6–23)
CALCIUM: 8.6 mg/dL (ref 8.4–10.5)
CHLORIDE: 107 meq/L (ref 96–112)
CO2: 23 meq/L (ref 19–32)
Creat: 0.8 mg/dL (ref 0.50–1.10)
GFR, Est African American: >89 mL/min
GFR, Est Non African American: 82 mL/min
Glucose, Bld: 161 mg/dL — ABNORMAL HIGH (ref 70–99)
POTASSIUM: 3.8 meq/L (ref 3.5–5.3)
SODIUM: 144 meq/L (ref 135–145)
TOTAL PROTEIN: 6.5 g/dL (ref 6.0–8.3)
Total Bilirubin: 0.4 mg/dL (ref 0.2–1.2)

## 2014-08-12 LAB — GLUCOSE, POCT (MANUAL RESULT ENTRY): POC Glucose: 161 mg/dl — AB (ref 70–99)

## 2014-08-12 LAB — POCT GLYCOSYLATED HEMOGLOBIN (HGB A1C): Hemoglobin A1C: 7.4

## 2014-08-12 MED ORDER — LISINOPRIL 30 MG PO TABS: 30.0000 mg | ORAL_TABLET | Freq: Every day | ORAL | Status: DC

## 2014-08-12 MED ORDER — TRAMADOL HCL 50 MG PO TABS
50.0000 mg | ORAL_TABLET | Freq: Three times a day (TID) | ORAL | Status: DC | PRN
Start: 2014-08-12 — End: 2015-03-30

## 2014-08-12 MED ORDER — GLYBURIDE 2.5 MG PO TABS
2.5000 mg | ORAL_TABLET | Freq: Two times a day (BID) | ORAL | Status: DC
Start: 2014-08-12 — End: 2015-02-09

## 2014-08-12 MED ORDER — METFORMIN HCL 1000 MG PO TABS: 1000.0000 mg | ORAL_TABLET | Freq: Two times a day (BID) | ORAL | Status: DC

## 2014-08-12 MED ORDER — LISINOPRIL 10 MG PO TABS: ORAL_TABLET | ORAL | Status: DC

## 2014-08-12 MED ORDER — ATORVASTATIN CALCIUM 20 MG PO TABS: 20.0000 mg | ORAL_TABLET | Freq: Every day | ORAL | Status: DC

## 2014-08-12 NOTE — Progress Notes (Signed)
MRN: [id number] Name: Megan Lane  Sex: female Age: 59 y.o. DOB: 07/05/1955  Allergies: Penicillins  Chief Complaint  Patient presents with  . Abdominal Pain    HPI: Patient is 59 y.o. female who has to of diabetes, hypertension, hyperlipidemia, comes today for followup, she ran out of for all medications as well as she reported noticed some lump in the breast recently, denies any pain fever nipple discharge, she also missed her mammogram appointment, she needs another referral also has on and off abdominal pain denies any nausea vomiting change in bowel habits. On the last set abdominal ultrasound was ordered which she has not done yet.  Past Medical History  Diagnosis Date  . Hypertension   . Diabetes mellitus     Past Surgical History  Procedure Laterality Date  . Knee surgery    . Abdominal hysterectomy        Medication List       This list is accurate as of: 08/12/14  1:16 PM.  Always use your most recent med list.               atorvastatin 20 MG tablet  Commonly known as:  LIPITOR  Take 1 tablet (20 mg total) by mouth daily.     benzonatate 100 MG capsule  Commonly known as:  TESSALON  Take 1 capsule (100 mg total) by mouth 3 (three) times daily as needed for cough.     glyBURIDE 2.5 MG tablet  Commonly known as:  DIABETA  Take 1 tablet (2.5 mg total) by mouth 2 (two) times daily with a meal.     lisinopril 30 MG tablet  Commonly known as:  PRINIVIL,ZESTRIL  Take 1 tablet (30 mg total) by mouth daily.     lisinopril 10 MG tablet  Commonly known as:  PRINIVIL,ZESTRIL  Take 3 tablets by mouth daily.     metFORMIN 1000 MG tablet  Commonly known as:  GLUCOPHAGE  Take 1 tablet (1,000 mg total) by mouth 2 (two) times daily with a meal.     traMADol 50 MG tablet  Commonly known as:  ULTRAM  Take 1 tablet (50 mg total) by mouth every 8 (eight) hours as needed.        Meds ordered this encounter  Medications  . atorvastatin (LIPITOR) 20 MG  tablet    Sig: Take 1 tablet (20 mg total) by mouth daily.    Dispense:  90 tablet    Refill:  3  . glyBURIDE (DIABETA) 2.5 MG tablet    Sig: Take 1 tablet (2.5 mg total) by mouth 2 (two) times daily with a meal.    Dispense:  60 tablet    Refill:  3  . lisinopril (PRINIVIL,ZESTRIL) 30 MG tablet    Sig: Take 1 tablet (30 mg total) by mouth daily.    Dispense:  30 tablet    Refill:  3  . metFORMIN (GLUCOPHAGE) 1000 MG tablet    Sig: Take 1 tablet (1,000 mg total) by mouth 2 (two) times daily with a meal.    Dispense:  60 tablet    Refill:  3  . traMADol (ULTRAM) 50 MG tablet    Sig: Take 1 tablet (50 mg total) by mouth every 8 (eight) hours as needed.    Dispense:  30 tablet    Refill:  0    Immunization History  Administered Date(s) Administered  . Influenza,trivalent, recombinat, inj, PF 03/18/2013  . Tetanus 03/18/2013  Family History  Problem Relation Age of Onset  . Diabetes Mother   . Hypertension Mother   . Stroke Mother   . Heart disease Brother     History  Substance Use Topics  . Smoking status: Former Smoker    Quit date: 08/11/2005  . Smokeless tobacco: Not on file  . Alcohol Use: No    Review of Systems   As noted in HPI  Filed Vitals:   08/12/14 1104  BP: 145/91  Pulse: 72  Temp: 98.2 F (36.8 C)  Resp: 18    Physical Exam  Physical Exam  Constitutional: No distress.  Eyes: EOM are normal. Pupils are equal, round, and reactive to light.  Cardiovascular: Normal rate and regular rhythm.   Pulmonary/Chest: Breath sounds normal. No respiratory distress. She has no wheezes. She has no rales.  Musculoskeletal: She exhibits no edema.    CBC    Component Value Date/Time   WBC 6.4 10/27/2013 1722   RBC 4.81 10/27/2013 1722   HGB 13.3 10/27/2013 1722   HCT 39.8 10/27/2013 1722   PLT 321 10/27/2013 1722   MCV 82.7 10/27/2013 1722   LYMPHSABS 2.6 03/18/2013 1446   MONOABS 0.4 03/18/2013 1446   EOSABS 0.1 03/18/2013 1446   BASOSABS 0.0  03/18/2013 1446    CMP     Component Value Date/Time   NA 143 10/27/2013 1722   K 3.9 10/27/2013 1722   CL 106 10/27/2013 1722   CO2 24 10/27/2013 1722   GLUCOSE 131* 10/27/2013 1722   BUN 13 10/27/2013 1722   CREATININE 0.76 10/27/2013 1722   CREATININE 0.81 10/16/2013 1019   CALCIUM 9.0 10/27/2013 1722   PROT 7.1 10/27/2013 2130   ALBUMIN 3.6 10/27/2013 2130   AST 21 10/27/2013 2130   ALT 25 10/27/2013 2130   ALKPHOS 96 10/27/2013 2130   BILITOT <0.2* 10/27/2013 2130   GFRNONAA >90 10/27/2013 1722   GFRNONAA 81 10/16/2013 1019   GFRAA >90 10/27/2013 1722   GFRAA >89 10/16/2013 1019    Lab Results  Component Value Date/Time   CHOL 200 10/16/2013 10:19 AM    Lab Results  Component Value Date/Time   HGBA1C 7.4 08/12/2014 11:54 AM    Lab Results  Component Value Date/Time   AST 21 10/27/2013 09:30 PM    Assessment and Plan  Other specified diabetes mellitus without complications - Plan:  Results for orders placed or performed in visit on 08/12/14  Glucose (CBG)  Result Value Ref Range   POC Glucose 161 (A) 70 - 99 mg/dl  HgB A1c  Result Value Ref Range   Hemoglobin A1C 7.4    Resume back on oral medications,  glyBURIDE (DIABETA) 2.5 MG tablet, metFORMIN (GLUCOPHAGE) 1000 MG tablet, advise patient for low carbohydrate diet.  Essential hypertension, benign - Plan:advised patient for DASH diet, resume back on  lisinopril (PRINIVIL,ZESTRIL) 30 MG tablet, COMPLETE METABOLIC PANEL WITH GFR, Vit D  25 hydroxy (rtn osteoporosis monitoring)  Encounter for screening mammogram for breast cancer - Plan: MM DIGITAL SCREENING BILATERAL  Abdominal pain, epigastric - Plan: US Abdomen Complete   Health Maintenance  -Mammogram:ordered   Return in about 3 months (around 11/12/2014), or if symptoms worsen or fail to improve.   This note has been created with Surveyor, quantity. Any transcriptional errors are unintentional.     Lorayne Marek, MD

## 2014-08-12 NOTE — Progress Notes (Signed)
Patient here complaining of abdominal pain and swelling just above her naval.  Patient was supposed to have MRI but could not afford it.  Patient also reports knot in right breast, not painful, but hard.  She states "it's about the size of a pea."   Patient has not been taking any prescribed medications because she has not been able to afford them.  I instructed patient to speak with our pharmacy because we could work with her to be able to get her medications.  Patient also in process of attaining [facility] [facility].

## 2014-08-13 ENCOUNTER — Telehealth: Payer: Self-pay

## 2014-08-13 LAB — VITAMIN D 25 HYDROXY (VIT D DEFICIENCY, FRACTURES): Vit D, 25-Hydroxy: 17 ng/mL — ABNORMAL LOW (ref 30–100)

## 2014-08-13 MED ORDER — VITAMIN D (ERGOCALCIFEROL) 1.25 MG (50000 UNIT) PO CAPS
50000.0000 [IU] | ORAL_CAPSULE | ORAL | Status: DC
Start: 2014-08-13 — End: 2016-12-04

## 2014-08-13 NOTE — Telephone Encounter (Signed)
Patient is aware of her lab results [facility]

## 2014-08-13 NOTE — Telephone Encounter (Signed)
-----   Message from Lorayne Marek, MD sent at 08/13/2014 12:49 PM EDT ----- Blood work reviewed, noticed low vitamin D, call patient advise to start ergocalciferol 50,000 units once a week for the duration of  12 weeks, then take OTC vitamin d 2000 units daily. D

## 2014-08-17 ENCOUNTER — Ambulatory Visit (HOSPITAL_COMMUNITY)
Admission: RE | Admit: 2014-08-17 | Discharge: 2014-08-17 | Disposition: A | Discharge status: Home / Self Care | Payer: Self-pay | Source: Ambulatory Visit | Attending: Internal Medicine | Admitting: Internal Medicine

## 2014-08-17 DIAGNOSIS — I714 Abdominal aortic aneurysm, without rupture: Secondary | ICD-10-CM | POA: Insufficient documentation

## 2014-08-17 DIAGNOSIS — R1013 Epigastric pain: Secondary | ICD-10-CM | POA: Insufficient documentation

## 2014-08-20 ENCOUNTER — Telehealth: Payer: Self-pay

## 2014-08-20 NOTE — Telephone Encounter (Signed)
-----   Message from Lorayne Marek, MD sent at 08/17/2014 10:31 AM EDT ----- Call and let the  patient know that her abdominal  ultrasound was negative for gallstones but she has possible fatty liver, advise patient for low fat diet, exercise, loose weight. Also reported that she has abdominal aortic aneurysm which is 3.2 cm , which will need to be rechecked with ultrasound  in 3 years, advise patient for better blood pressure control, DASH diet also quit smoking if she smokes cigarettes

## 2014-08-20 NOTE — Telephone Encounter (Signed)
Patient not available Left message on voice mail to return our call 

## 2014-10-30 IMAGING — US US ABDOMEN COMPLETE
1 series · 13 of 25 positions shown · non-contrast
Comparison: None.

CLINICAL DATA: Abdominal pain for 1 year

EXAM:
ULTRASOUND ABDOMEN COMPLETE

[Series 1: us abdomen complete · 0.24mm/px · 13 of 63 slices shown]
[im 1/63]
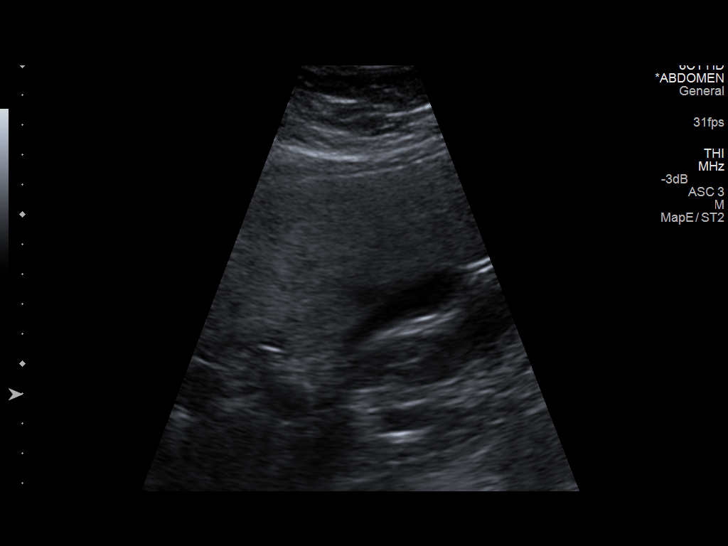
[im 6/63]
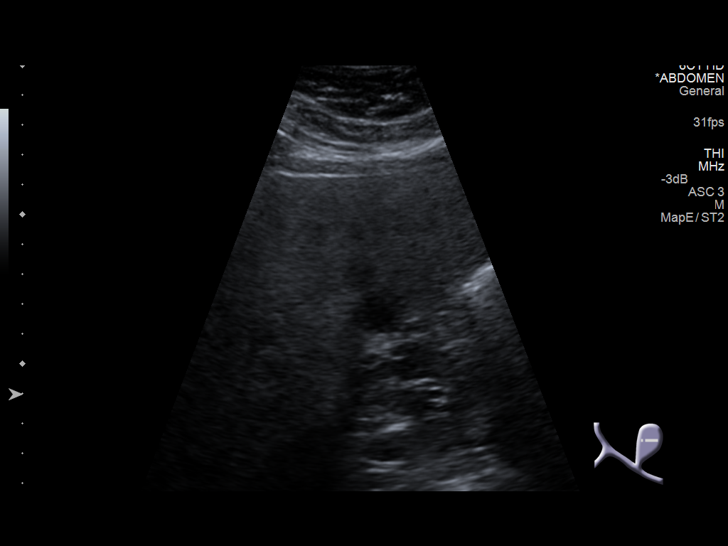
[im 11/63]
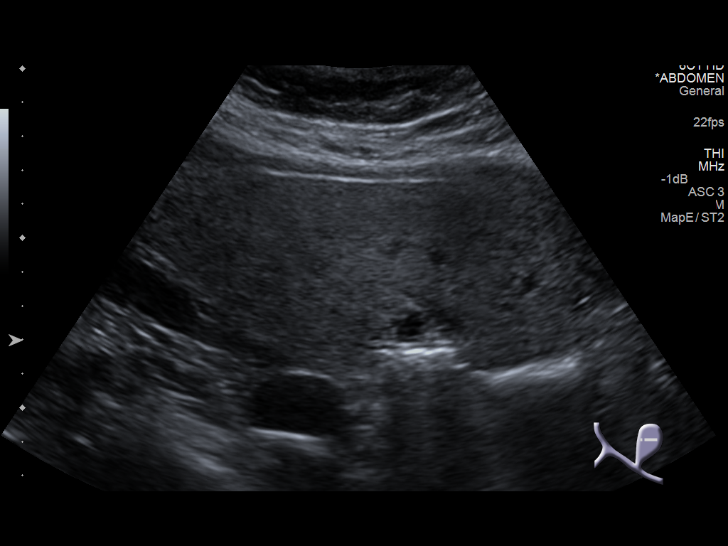
[im 16/63]
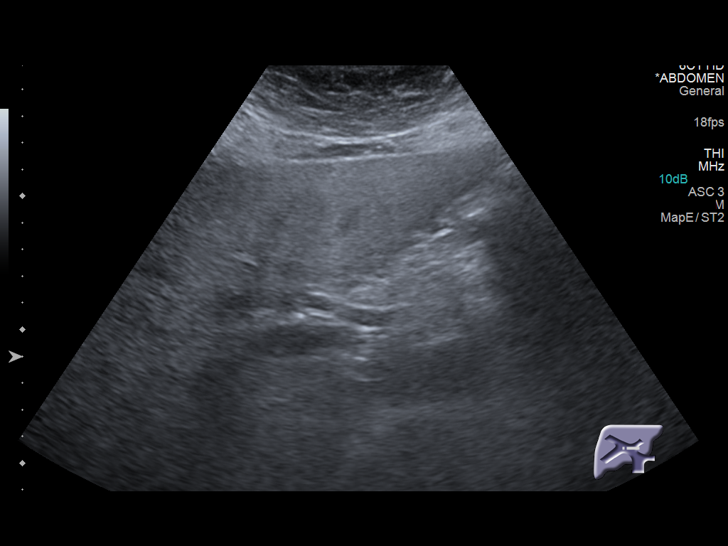
[im 21/63]
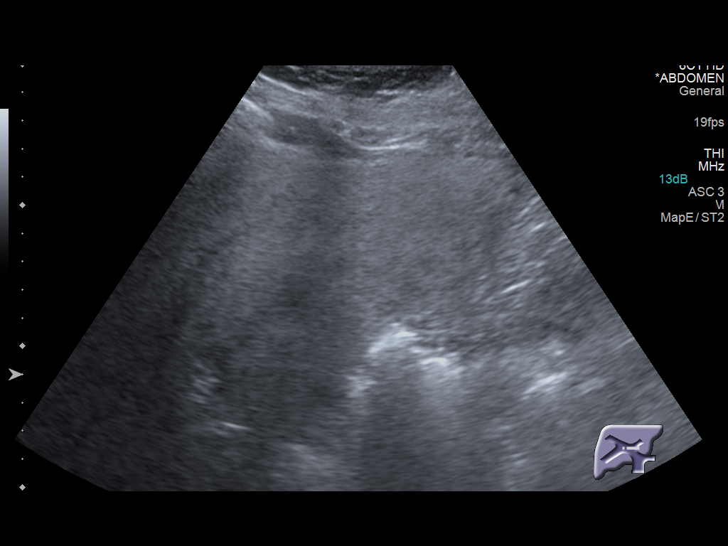
[im 26/63]
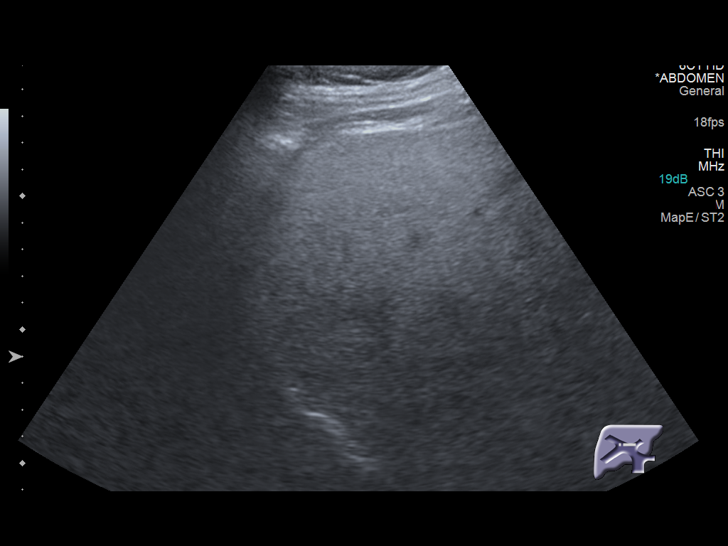
[im 32/63]
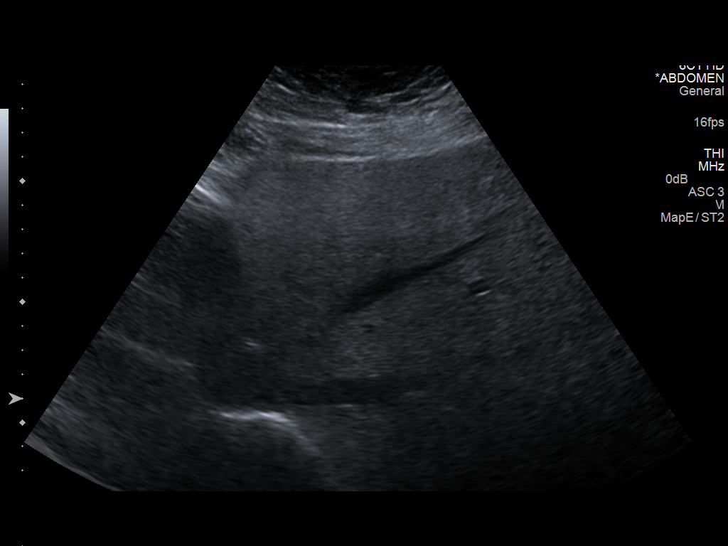
[im 37/63]
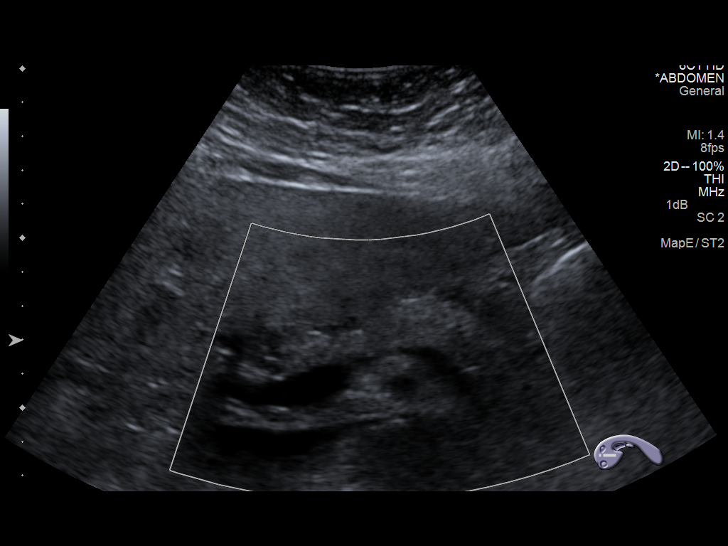
[im 42/63]
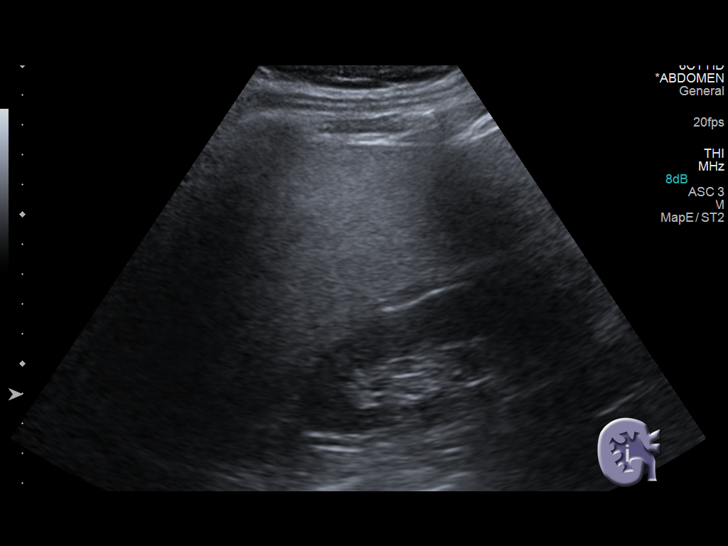
[im 47/63]
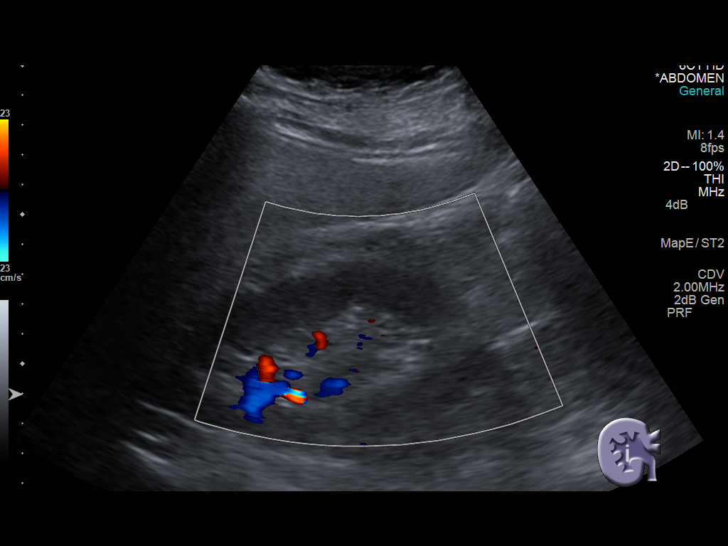
[im 52/63]
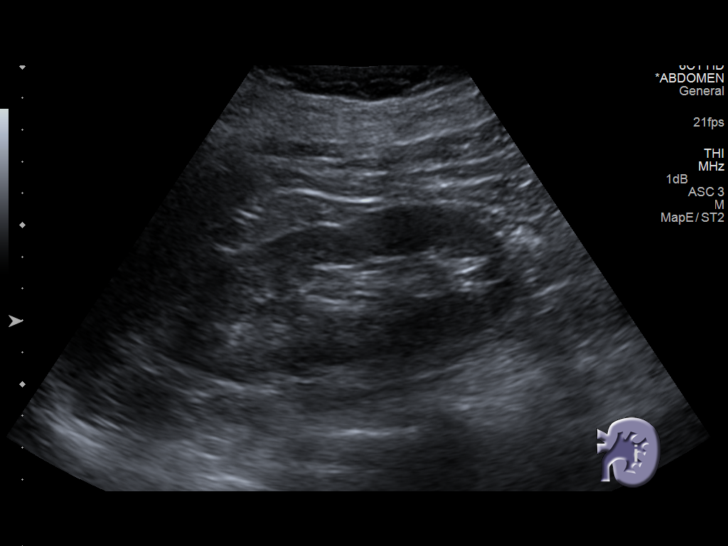
[im 57/63]
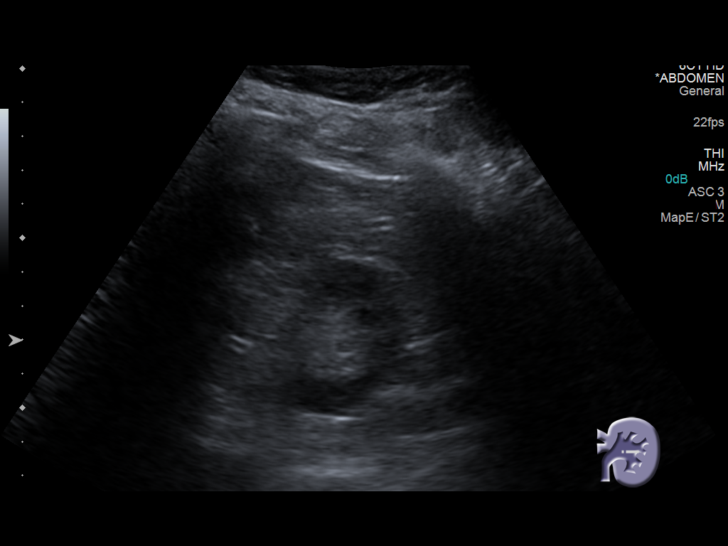
[im 63/63]
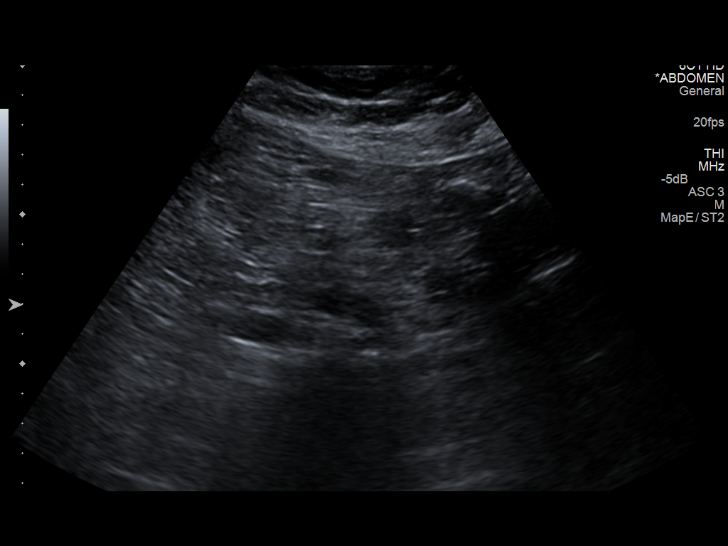

[13 of 25 positions shown; findings below may reference images not displayed]

FINDINGS: Gallbladder: No gallstones are noted within gallbladder. The
gallbladder is contracted. Borderline thickening of gallbladder wall
up to 3 mm. No sonographic Murphy's sign.

Common bile duct: Diameter: 6 mm in diameter within normal limits.

Liver: No focal hepatic mass. Diffuse increased echogenicity of the
liver suspicious for fatty infiltration.

IVC: Limited assessment in visualization due to bowel gas.

Pancreas: Visualized portion unremarkable.

Spleen: Size and appearance within normal limits. Measures up to
cm in diameter

Right Kidney: Length: 13.3 cm. Echogenicity within normal limits. No
mass or hydronephrosis visualized.

Left Kidney: Length: 12 cm. Echogenicity within normal limits. No
mass or hydronephrosis visualized.

Abdominal aorta: Mildly ectatic abdominal aorta Measures up to
cm in diameter.

Other findings: None.
IMPRESSION: 1. Contracted gallbladder without shadowing gallstones.
2. CBD measures 6 mm in diameter.
3. Mild increased echogenicity of the liver suspicious for fatty
infiltration.
4. Aneurysmal dilatation of abdominal aorta up to 3.2 cm Recommend
followup by ultrasound in 3 years. This recommendation follows ACR
consensus guidelines: White Paper of the ACR Incidental Findings

## 2014-11-17 ENCOUNTER — Telehealth: Payer: Self-pay

## 2014-11-17 NOTE — Telephone Encounter (Signed)
Returned patient phone call Patient was inquiring about an appt.  For elegibility Call was transferred to front desk to be scheduled

## 2014-11-24 ENCOUNTER — Ambulatory Visit: Payer: Self-pay | Attending: Internal Medicine

## 2015-01-14 ENCOUNTER — Telehealth: Payer: Self-pay

## 2015-01-14 NOTE — Telephone Encounter (Signed)
Returned patient phone call Patient was not available Left message with family member to return our call

## 2015-01-29 ENCOUNTER — Ambulatory Visit: Payer: Self-pay | Admitting: Internal Medicine

## 2015-02-03 ENCOUNTER — Telehealth: Payer: Self-pay

## 2015-02-03 NOTE — Telephone Encounter (Signed)
Returned patient phone call Patient just wanted to make [place] aware she will be picking Up her blood pressure medication and metformin this evening At the pharmacy here

## 2015-02-09 ENCOUNTER — Ambulatory Visit: Payer: Self-pay | Attending: Internal Medicine | Admitting: Internal Medicine

## 2015-02-09 ENCOUNTER — Encounter: Payer: Self-pay | Admitting: Internal Medicine

## 2015-02-09 VITALS — BP 136/86 | HR 76 | Temp 98.0°F | Resp 16 | Wt 237.6 lb

## 2015-02-09 DIAGNOSIS — Z0000 Encounter for general adult medical examination without abnormal findings: Secondary | ICD-10-CM

## 2015-02-09 DIAGNOSIS — E785 Hyperlipidemia, unspecified: Secondary | ICD-10-CM

## 2015-02-09 DIAGNOSIS — I10 Essential (primary) hypertension: Secondary | ICD-10-CM

## 2015-02-09 DIAGNOSIS — M25562 Pain in left knee: Secondary | ICD-10-CM

## 2015-02-09 DIAGNOSIS — Z23 Encounter for immunization: Secondary | ICD-10-CM | POA: Insufficient documentation

## 2015-02-09 DIAGNOSIS — E119 Type 2 diabetes mellitus without complications: Secondary | ICD-10-CM

## 2015-02-09 DIAGNOSIS — Z7984 Long term (current) use of oral hypoglycemic drugs: Secondary | ICD-10-CM | POA: Insufficient documentation

## 2015-02-09 DIAGNOSIS — K429 Umbilical hernia without obstruction or gangrene: Secondary | ICD-10-CM

## 2015-02-09 LAB — POCT URINALYSIS DIPSTICK
Bilirubin, UA: NEGATIVE
Blood, UA: NEGATIVE
GLUCOSE UA: NEGATIVE
KETONES UA: NEGATIVE
Leukocytes, UA: NEGATIVE
Nitrite, UA: NEGATIVE
PROTEIN UA: NEGATIVE
SPEC GRAV UA: 1.02
Urobilinogen, UA: 1
pH, UA: 7

## 2015-02-09 LAB — BASIC METABOLIC PANEL
BUN: 15 mg/dL (ref 7–25)
CO2: 25 mmol/L (ref 20–31)
CREATININE: 1 mg/dL (ref 0.50–1.05)
Calcium: 9.2 mg/dL (ref 8.6–10.4)
Chloride: 107 mmol/L (ref 98–110)
Glucose, Bld: 163 mg/dL — ABNORMAL HIGH (ref 65–99)
POTASSIUM: 4.5 mmol/L (ref 3.5–5.3)
SODIUM: 143 mmol/L (ref 135–146)

## 2015-02-09 LAB — POCT GLYCOSYLATED HEMOGLOBIN (HGB A1C): Hemoglobin A1C: 7

## 2015-02-09 LAB — GLUCOSE, POCT (MANUAL RESULT ENTRY): POC Glucose: 147 mg/dl — AB (ref 70–99)

## 2015-02-09 MED ORDER — LISINOPRIL 10 MG PO TABS: 20.0000 mg | ORAL_TABLET | Freq: Every day | ORAL | Status: DC

## 2015-02-09 MED ORDER — ATORVASTATIN CALCIUM 20 MG PO TABS: 20.0000 mg | ORAL_TABLET | Freq: Every day | ORAL | Status: DC

## 2015-02-09 MED ORDER — METFORMIN HCL 1000 MG PO TABS: 1000.0000 mg | ORAL_TABLET | Freq: Two times a day (BID) | ORAL | Status: DC

## 2015-02-09 NOTE — Progress Notes (Signed)
Patient ID: Megan Lane, female   DOB: 12-Sep-1955, 60 y.o.   MRN: [id number] SUBJECTIVE: 60 y.o. female for follow up of diabetes. Diabetic Review of Systems - medication compliance: noncompliant much of the time---ran out of medication for 2 months, diabetic diet compliance: compliant most of the time, further diabetic ROS: no polyuria or polydipsia, no chest pain, dyspnea or TIA's, no numbness, tingling or pain in extremities, no unusual visual symptoms, no hypoglycemia.  Patient admits to only taking 10 mg of Lisinopril daily instead of 30 mg. Reports that she rather pray about her health than take medication. Other symptoms and concerns: Abdominal pain for 1.5 years. Soreness that happens randomly once per month. States that it is not associated with nausea, vomiting, constipated, diarrhea. She is unable to explain the discomfort. She states that it randomly feels hard like a knot in her abdomen. Denies heartburns.  Left knee pain that has been present since she broke her patella bone in 2011. Had surgery performed by Dr. Henrietta Dine at [facility]. Sharp pain that is exacerbated by weather. Pain aggravated by walking. She also reports cramps in legs at night---gets up to walk. No leg cramps noticed when walking.   Current Outpatient Prescriptions  Medication Sig Dispense Refill  . lisinopril (PRINIVIL,ZESTRIL) 30 MG tablet Take 1 tablet (30 mg total) by mouth daily. 30 tablet 3  . metFORMIN (GLUCOPHAGE) 1000 MG tablet Take 1 tablet (1,000 mg total) by mouth 2 (two) times daily with a meal. 60 tablet 3  . atorvastatin (LIPITOR) 20 MG tablet Take 1 tablet (20 mg total) by mouth daily. 90 tablet 3  . benzonatate (TESSALON) 100 MG capsule Take 1 capsule (100 mg total) by mouth 3 (three) times daily as needed for cough. (Patient not taking: Reported on 08/12/2014) 30 capsule 1  . glyBURIDE (DIABETA) 2.5 MG tablet Take 1 tablet (2.5 mg total) by mouth 2 (two) times daily with a meal. 60 tablet 3  .  lisinopril (PRINIVIL,ZESTRIL) 10 MG tablet Take 3 tablets by mouth daily. 90 tablet 3  . traMADol (ULTRAM) 50 MG tablet Take 1 tablet (50 mg total) by mouth every 8 (eight) hours as needed. 30 tablet 0  . Vitamin D, Ergocalciferol, (DRISDOL) 50000 UNITS CAPS capsule Take 1 capsule (50,000 Units total) by mouth every 7 (seven) days. 12 capsule 0   No current facility-administered medications for this visit.   Review of Systems:Other than what is stated in HPI, all other systems are negative.   OBJECTIVE: Appearance: alert, well appearing, and in no distress, oriented to person, place, and time and overweight. BP 136/86 mmHg  Pulse 76  Temp(Src) 98 F (36.7 C)  Resp 16  Wt 237 lb 9.6 oz (107.775 kg)  SpO2 100%  Physical Exam  Constitutional: She is oriented to person, place, and time.  Cardiovascular: Normal rate, regular rhythm and normal heart sounds.   Pulses:      Dorsalis pedis pulses are 2+ on the right side, and 2+ on the left side.       Posterior tibial pulses are 2+ on the right side, and 2+ on the left side.  Pulmonary/Chest: Effort normal and breath sounds normal.  Musculoskeletal: Normal range of motion. She exhibits no tenderness.  Swelling of left knee Crepitus with ROM  Feet:  Right Foot:  Protective Sensation: 10 sites tested.10 sites sensed. Skin Integrity: Negative for skin breakdown.  Left Foot:  Protective Sensation: 10 sites tested. 10 sites sensed. Skin Integrity: Negative for skin  breakdown.  Neurological: She is alert and oriented to person, place, and time.  Skin: Skin is warm and dry.     ASSESSMENT: Yarima was seen today for diabetes.  Diagnoses and all orders for this visit:  Type 2 diabetes mellitus without complication, without long-term current use of insulin (HCC) -     Glucose (CBG) -     HgB A1c -     Flu Vaccine QUAD 36+ mos PF IM (Fluarix & Fluzone Quad PF) -     Urinalysis Dipstick -     Microalbumin, urine -    Refilled metFORMIN  (GLUCOPHAGE) 1000 MG tablet; Take 1 tablet (1,000 mg total) by mouth 2 (two) times daily with a meal. Patients diabetes is well control as evidence by consistently low a1c.  Patient will continue with current therapy and continue to make necessary lifestyle changes.  Reviewed foot care, diet, exercise, annual health maintenance with patient.   HLD (hyperlipidemia) -     atorvastatin (LIPITOR) 20 MG tablet; Take 1 tablet (20 mg total) by mouth daily. Patient was not taking medication. I explained the importance of cholesterol control in diabetes and highly encouraged patient to take medication as directed. Education provided on proper lifestyle changes in order to lower cholesterol. Patient advised to maintain healthy weight and to keep total fat intake at 25-35% of total calories and carbohydrates 50-60% of total daily calories. Explained how high cholesterol places patient at risk for heart disease. Patient placed on appropriate medication and repeat labs in 6 months   Essential hypertension -     lisinopril (PRINIVIL,ZESTRIL) 10 MG tablet; Take 2 tablets (20 mg total) by mouth daily. -     Basic Metabolic Panel Patients BP is slightly higher than goal. She admits to not taking medication as directed and just started back today with only taking 10 mg. I have encouraged patient to take medication and how it can later affect her kidney's  Umbilical hernia without obstruction and without gangrene -     Ambulatory referral to General Surgery  Left knee pain' Will send Ortho referral once she gets insurance   Health care maintenance -     Hepatitis panel, acute -     HIV antibody (with reflex) I have given her the number to breast center so that she can call ans schedule her mammogram. I will place order for her Colonoscopy when she gets insurance next month.   Return in about 3 months (around 05/10/2015) for DM/HTN.   Lance Bosch, NP 02/09/2015 3:45 PM\

## 2015-02-09 NOTE — Progress Notes (Signed)
Patient here for follow up on her diabetes Patient complains of abd pain for quite a while

## 2015-02-10 LAB — HEPATITIS PANEL, ACUTE
HCV AB: NEGATIVE
Hep A IgM: NONREACTIVE
Hep B C IgM: NONREACTIVE
Hepatitis B Surface Ag: NEGATIVE

## 2015-02-10 LAB — HIV ANTIBODY (ROUTINE TESTING W REFLEX): HIV 1&2 Ab, 4th Generation: NONREACTIVE

## 2015-02-10 LAB — MICROALBUMIN, URINE: Microalb, Ur: 0.4 mg/dL

## 2015-02-11 ENCOUNTER — Other Ambulatory Visit: Payer: Self-pay

## 2015-02-11 DIAGNOSIS — K047 Periapical abscess without sinus: Principal | ICD-10-CM

## 2015-02-11 DIAGNOSIS — K029 Dental caries, unspecified: Secondary | ICD-10-CM

## 2015-02-12 ENCOUNTER — Telehealth: Payer: Self-pay

## 2015-02-12 DIAGNOSIS — M25562 Pain in left knee: Principal | ICD-10-CM

## 2015-02-12 DIAGNOSIS — M25561 Pain in right knee: Secondary | ICD-10-CM

## 2015-02-12 NOTE — Telephone Encounter (Signed)
Spoke with patient and she is aware of her lab results

## 2015-02-12 NOTE — Telephone Encounter (Signed)
-----   Message from Lance Bosch, NP sent at 02/12/2015 12:12 PM EST ----- Labs are within normal limits

## 2015-02-19 ENCOUNTER — Telehealth: Payer: Self-pay

## 2015-02-19 NOTE — Telephone Encounter (Signed)
I need to have the insurance information first just in case if the insurance is [facility] [employer] they can't see her  Thank you .

## 2015-02-19 NOTE — Telephone Encounter (Signed)
Patient is aware we will need to see her New insurance card to try to schedule her surgery here In [place]

## 2015-02-19 NOTE — Telephone Encounter (Signed)
Unfortunately we don't have general surgery specialist for uninsured patients .

## 2015-02-19 NOTE — Telephone Encounter (Signed)
Returned call to patient in reference to her surgery Patient will have insurance next week Can we then refer her to [place] [facility]?

## 2015-02-19 NOTE — Telephone Encounter (Signed)
Returned phone call to patient [place]  Patient has no transportation to get to [facility] Is this something we can try to help her with Thanks

## 2015-03-04 ENCOUNTER — Ambulatory Visit: Payer: Self-pay | Admitting: Sports Medicine

## 2015-03-08 ENCOUNTER — Ambulatory Visit: Payer: Self-pay | Admitting: Surgery

## 2015-03-08 NOTE — H&P (Signed)
Megan Lane 03/08/2015 1:55 PM Location: [facility] Patient #: [id number] DOB: 10/12/1955 Widowed / Language: [place] / Race: Black or African American Female  History of Present Illness Megan Lane A. Megan Rozas MD; 03/08/2015 2:59 PM) Patient words: Patient sent at the request of Megan Manning NP for umbilical hernia. Patient has noticed hernia at bellybutton for many years. It is getting larger and causing mild to moderate discomfort especially with exertion. She has a small bulge. No nausea or vomiting.  The patient is a 60 year old female.   Past Surgical History Megan Lane, CMA; 03/08/2015 1:55 PM) Lung Surgery Left.  Diagnostic Studies History Megan Lane, CMA; 03/08/2015 1:55 PM) Mammogram 1-3 years ago Pap Smear 1-5 years ago  Allergies Megan Lane, CMA; 03/08/2015 1:56 PM) Penicillin G Procaine *PENICILLINS*  Medication History (Megan Lane, CMA; 03/08/2015 1:57 PM) MetFORMIN HCl (1000MG  Tablet, Oral) Active. Lisinopril (10MG  Tablet, Oral) Active. Atorvastatin Calcium (20MG  Tablet, Oral) Active. TraMADol HCl ER (Biphasic) (100MG  Tablet ER 24HR, Oral) Active. Medications Reconciled  Social History Megan Lane, CMA; 03/08/2015 1:55 PM) Tobacco use Former smoker.  Family History Megan Lane, CMA; 03/08/2015 1:55 PM) Colon Cancer Father.  Pregnancy / Birth History Megan Lane, [name]; 03/08/2015 1:55 PM) Megan Lane 2 Maternal Lane <15     Review of Systems (Megan Lane; 03/08/2015 1:55 PM) Skin Not Present- Change in Wart/Mole, Dryness, Hives, Jaundice, New Lesions, Non-Healing Wounds, Rash and Ulcer. Breast Not Present- Breast Mass, Breast Pain, Nipple Discharge and Skin Changes. Cardiovascular Present- Leg Cramps. Not Present- Chest Pain, Difficulty Breathing Lying Down, Palpitations, Rapid Heart Rate, Shortness of Breath and Swelling of Extremities.  Vitals (Megan Lane CMA; 03/08/2015 1:56 PM) 03/08/2015 1:55 PM Weight: 229 lb Height:  72in Body Surface Area: 2.26 m Body Mass Index: 31.06 kg/m  Pulse: 76 (Regular)  BP: 132/80 (Sitting, Left Arm, Standard)      Physical Exam (Megan Bhargava A. Ashelyn Mccravy MD; 03/08/2015 3:00 PM)  General Mental Status-Alert. General Appearance-Consistent with stated Lane. Hydration-Well hydrated. Voice-Normal.  Head and Neck Head-normocephalic, atraumatic with no lesions or palpable masses. Trachea-midline. Thyroid Gland Characteristics - normal size and consistency.  Chest and Lung Exam Chest and lung exam reveals -quiet, even and easy respiratory effort with no use of accessory muscles and on auscultation, normal breath sounds, no adventitious sounds and normal vocal resonance. Inspection Chest Wall - Normal. Back - normal.  Cardiovascular Cardiovascular examination reveals -normal heart sounds, regular rate and rhythm with no murmurs and normal pedal pulses bilaterally.  Abdomen Note: Small reducible umbilical hernia.  Musculoskeletal Normal Exam - Left-Upper Extremity Strength Normal and Lower Extremity Strength Normal. Normal Exam - Right-Upper Extremity Strength Normal and Lower Extremity Strength Normal.    Assessment & Plan (Megan Waln A. Mylo Driskill MD; [id number] PM)  UMBILICAL HERNIA WITHOUT OBSTRUCTION AND WITHOUT GANGRENE (K42.9) Impression: Discussed observation versus repair with mesh. Risks, benefits and alternatives discussed with the patient. She is opted for repair of umbilical hernia with mesh. The risk of hernia repair include bleeding, infection, organ injury, bowel injury, bladder injury, nerve injury recurrent hernia, blood clots, worsening of underlying condition, chronic pain, mesh use, open surgery, death, and the need for other operattions. Pt agrees to proceed  Current Plans You are being scheduled for surgery - Our schedulers will call you.  You should hear from our office's scheduling department within 5 working days about  the location, date, and time of surgery. We try to make accommodations for patient's preferences in scheduling surgery, but sometimes the  OR schedule or the surgeon's schedule prevents [place] from making those accommodations.  If you have not heard from our office [phone]) in 5 working days, call the office and ask for your surgeon's nurse.  If you have other questions about your diagnosis, plan, or surgery, call the office and ask for your surgeon's nurse.  The anatomy & physiology of the abdominal wall and pelvic floor was discussed. The pathophysiology of hernias in the inguinal and pelvic region was discussed. Natural history risks such as progressive enlargement, pain, incarceration, and strangulation was discussed. Contributors to complications such as smoking, obesity, diabetes, prior surgery, etc were discussed.  I feel the risks of no intervention will lead to serious problems that outweigh the operative risks; therefore, I recommended surgery to reduce and repair the hernia. I explained laparoscopic techniques with possible need for an open approach. I noted usual use of mesh to patch and/or buttress hernia repair  Risks such as bleeding, infection, abscess, need for further treatment, heart attack, death, and other risks were discussed. I noted a good likelihood this will help address the problem. Goals of post-operative recovery were discussed as well. Possibility that this will not correct all symptoms was explained. I stressed the importance of low-impact activity, aggressive pain control, avoiding constipation, & not pushing through pain to minimize risk of post-operative chronic pain or injury. Possibility of reherniation was discussed. We will work to minimize complications.  An educational handout further explaining the pathology & treatment options was given as well. Questions were answered. The patient expresses understanding & wishes to proceed with surgery.  Pt Education - CCS  Hernia Post-Op HCI ([place]ross): discussed with patient and provided information. Pt Education - CCS Pain Control ([place]) Pt Education - Consent for inguinal hernia - [place]: discussed with patient and provided information. Pt Education - Pamphlet Given - Hernia Surgery: discussed with patient and provided information.

## 2015-03-23 ENCOUNTER — Ambulatory Visit: Payer: Self-pay | Admitting: Sports Medicine

## 2015-03-30 ENCOUNTER — Other Ambulatory Visit: Payer: Self-pay

## 2015-03-30 ENCOUNTER — Telehealth: Payer: Self-pay

## 2015-03-30 MED ORDER — TRAMADOL HCL 50 MG PO TABS: 50.0000 mg | ORAL_TABLET | Freq: Three times a day (TID) | ORAL | Status: DC | PRN

## 2015-03-30 NOTE — Telephone Encounter (Signed)
Patient called stating she needs to have hernia surgery but Has been in a lot of pain Patient was asking if you can prescribe her something for pain

## 2015-03-30 NOTE — Telephone Encounter (Signed)
Tried to contact patient to let her know her RX for tramadol is ready for pick up Patient not available Left message on voice mail to return our call

## 2015-03-30 NOTE — Telephone Encounter (Signed)
May give her Tramadol that is on file

## 2015-03-31 ENCOUNTER — Telehealth: Payer: Self-pay

## 2015-03-31 NOTE — Telephone Encounter (Signed)
Returned patient phone call Patient not available Message left on voice mail to return our call 

## 2015-04-13 ENCOUNTER — Ambulatory Visit: Payer: Self-pay | Admitting: Sports Medicine

## 2016-03-29 ENCOUNTER — Other Ambulatory Visit: Payer: Self-pay | Admitting: Internal Medicine

## 2016-03-29 DIAGNOSIS — Z13220 Encounter for screening for lipoid disorders: Secondary | ICD-10-CM | POA: Diagnosis not present

## 2016-03-29 DIAGNOSIS — Z131 Encounter for screening for diabetes mellitus: Secondary | ICD-10-CM | POA: Diagnosis not present

## 2016-03-29 DIAGNOSIS — I10 Essential (primary) hypertension: Secondary | ICD-10-CM | POA: Diagnosis not present

## 2016-03-29 DIAGNOSIS — K439 Ventral hernia without obstruction or gangrene: Secondary | ICD-10-CM | POA: Diagnosis not present

## 2016-03-29 DIAGNOSIS — E1165 Type 2 diabetes mellitus with hyperglycemia: Secondary | ICD-10-CM | POA: Diagnosis not present

## 2016-03-29 DIAGNOSIS — E0842 Diabetes mellitus due to underlying condition with diabetic polyneuropathy: Secondary | ICD-10-CM | POA: Diagnosis not present

## 2016-03-29 DIAGNOSIS — Z1231 Encounter for screening mammogram for malignant neoplasm of breast: Secondary | ICD-10-CM

## 2016-04-03 ENCOUNTER — Ambulatory Visit: Payer: Self-pay | Admitting: Family Medicine

## 2016-04-03 NOTE — Progress Notes (Deleted)
   Subjective:  Patient ID: Megan Lane, female    DOB: 04/12/1955  Age: 61 y.o. MRN: [id number]  CC: No chief complaint on file.   HPI Megan Lane presents for ***   Outpatient Medications Prior to Visit  Medication Sig Dispense Refill  . atorvastatin (LIPITOR) 20 MG tablet Take 1 tablet (20 mg total) by mouth daily. 90 tablet 3  . benzonatate (TESSALON) 100 MG capsule Take 1 capsule (100 mg total) by mouth 3 (three) times daily as needed for cough. (Patient not taking: Reported on 08/12/2014) 30 capsule 1  . lisinopril (PRINIVIL,ZESTRIL) 10 MG tablet Take 2 tablets (20 mg total) by mouth daily. 60 tablet 3  . metFORMIN (GLUCOPHAGE) 1000 MG tablet Take 1 tablet (1,000 mg total) by mouth 2 (two) times daily with a meal. 60 tablet 3  . traMADol (ULTRAM) 50 MG tablet Take 1 tablet (50 mg total) by mouth every 8 (eight) hours as needed. 30 tablet 0  . Vitamin D, Ergocalciferol, (DRISDOL) 50000 UNITS CAPS capsule Take 1 capsule (50,000 Units total) by mouth every 7 (seven) days. 12 capsule 0   No facility-administered medications prior to visit.     ROS Review of Systems     Objective:  There were no vitals taken for this visit.  BP/Weight 02/09/2015 08/12/2014 [id number]  Systolic BP [id number]  Diastolic BP 86 91 80  Wt. (Lbs) 237.6 229.4 -  BMI 32.22 31.11 -     Physical Exam   Assessment & Plan:   Problem List Items Addressed This Visit    None      No orders of the defined types were placed in this encounter.   Follow-up: No Follow-up on file.   Alfonse Spruce FNP

## 2016-04-11 ENCOUNTER — Other Ambulatory Visit: Payer: Self-pay | Admitting: Internal Medicine

## 2016-04-11 DIAGNOSIS — E2839 Other primary ovarian failure: Secondary | ICD-10-CM

## 2016-04-12 ENCOUNTER — Ambulatory Visit: Payer: Self-pay

## 2016-04-18 ENCOUNTER — Ambulatory Visit
Admission: RE | Admit: 2016-04-18 | Discharge: 2016-04-18 | Disposition: A | Discharge status: Home / Self Care | Payer: BLUE CROSS/BLUE SHIELD | Source: Ambulatory Visit | Attending: Internal Medicine | Admitting: Internal Medicine

## 2016-04-18 DIAGNOSIS — Z1231 Encounter for screening mammogram for malignant neoplasm of breast: Secondary | ICD-10-CM | POA: Diagnosis not present

## 2016-04-18 IMAGING — MG DIGITAL SCREENING BILATERAL MAMMOGRAM WITH CAD
4 series · 4 of 4 positions shown · non-contrast
Comparison: None.

CLINICAL DATA: Screening.

EXAM:
DIGITAL SCREENING BILATERAL MAMMOGRAM WITH CAD

[L MLO]
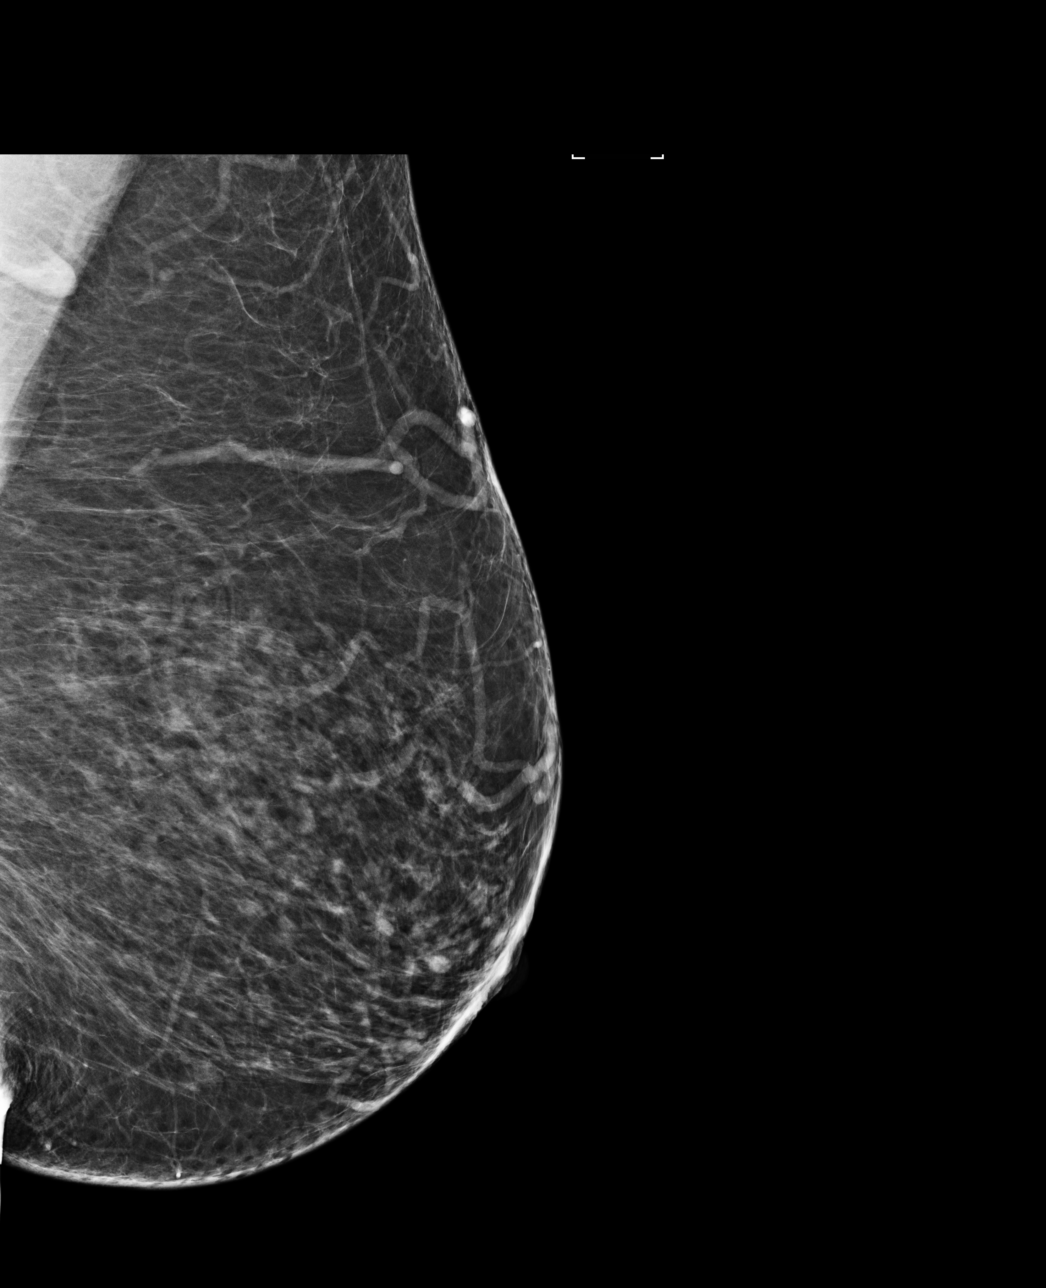

[R MLO]
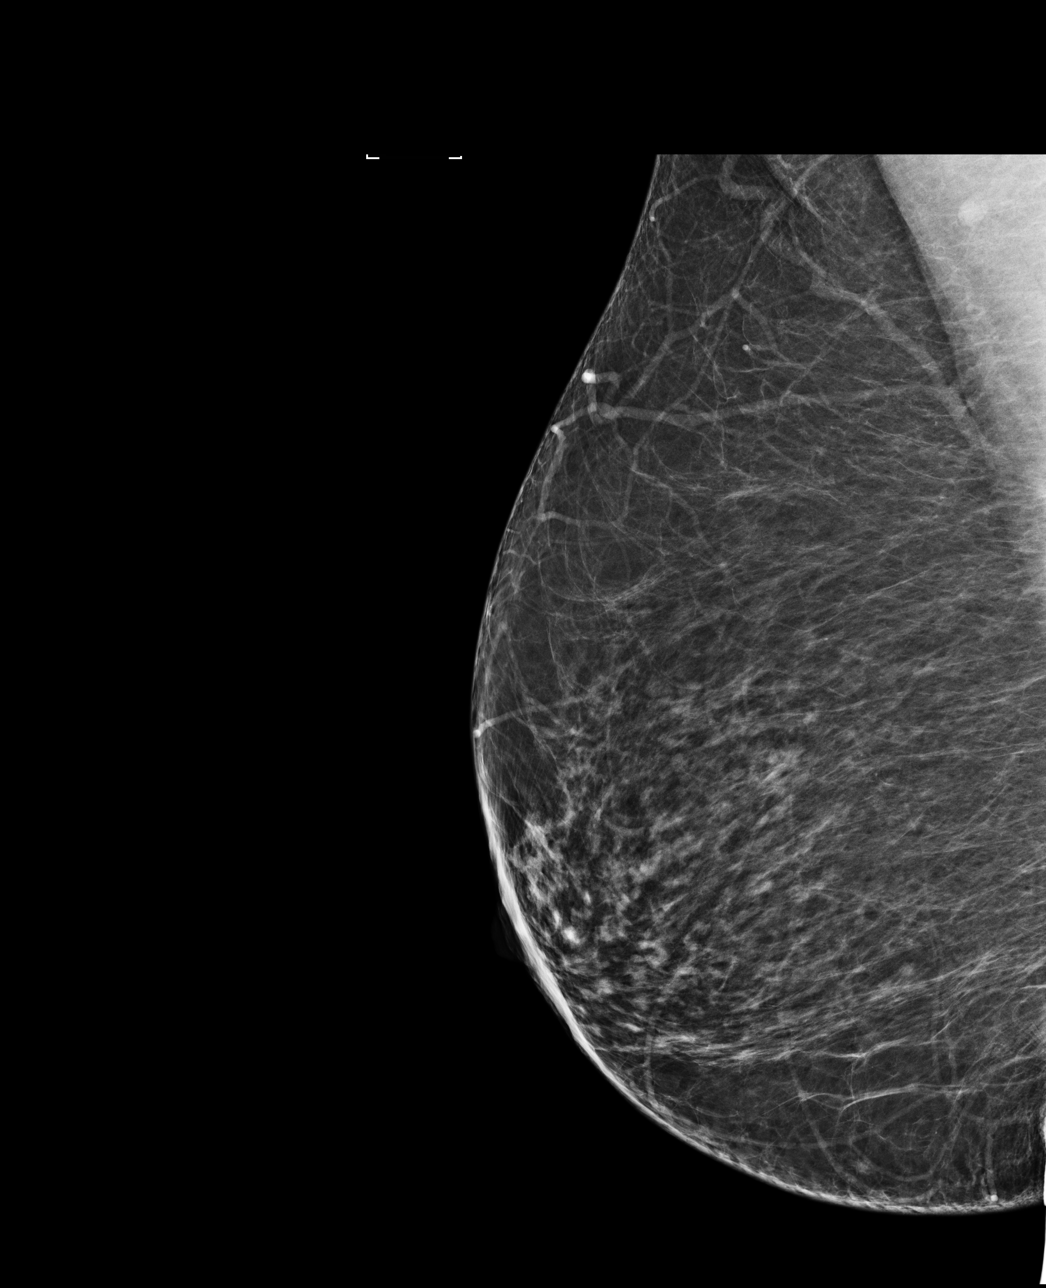

[R CC]
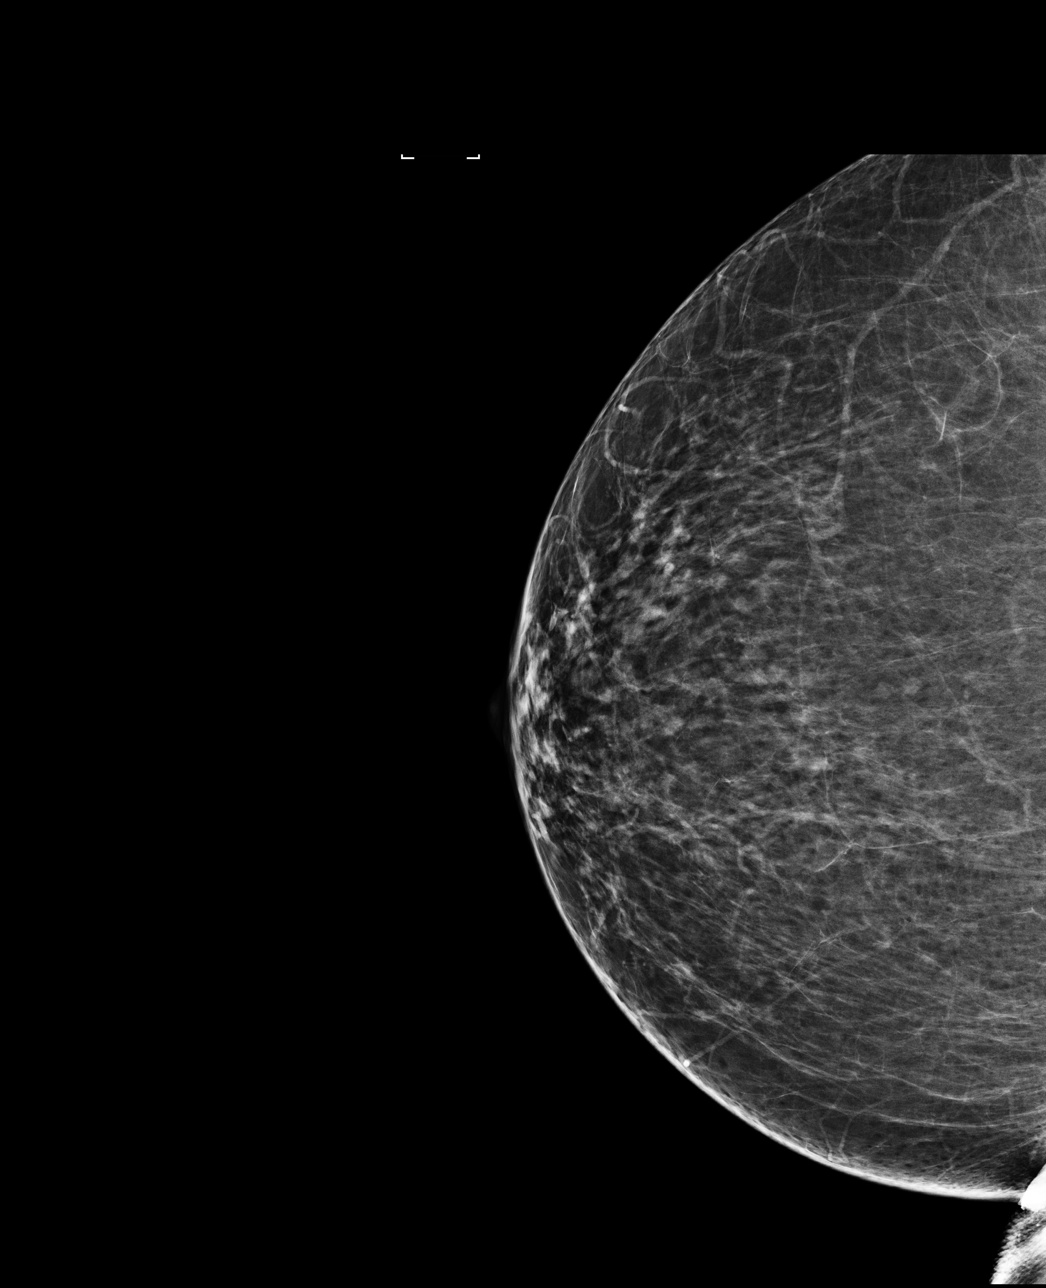

[L CC]
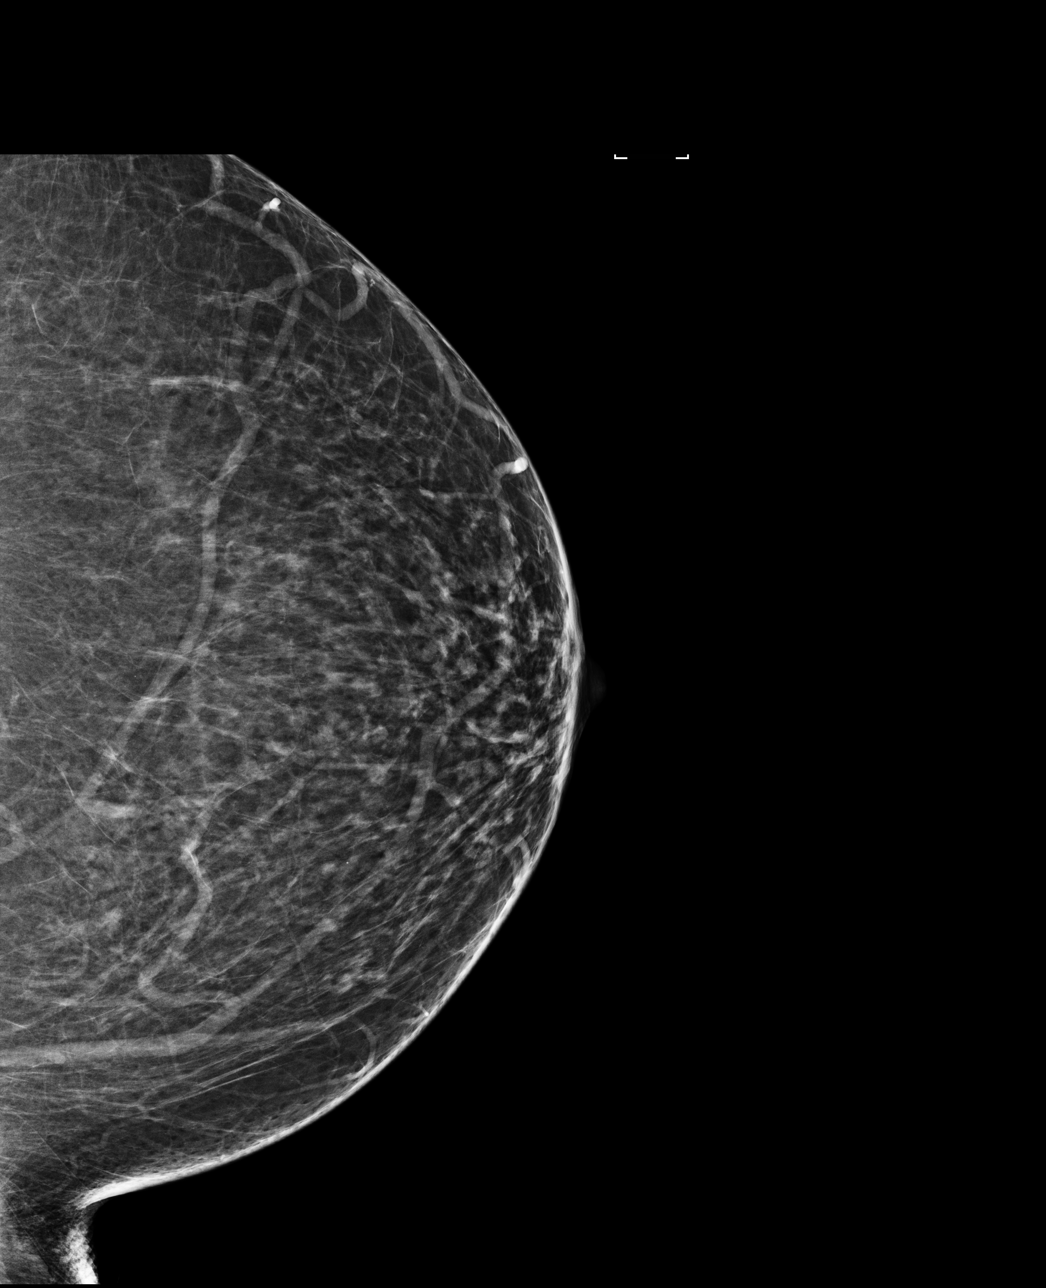

[4 of 4 positions shown; findings below may reference images not displayed]

ACR Breast Density Category b: There are scattered areas of
fibroglandular density.
FINDINGS: There are no findings suspicious for malignancy. Images were
processed with CAD.
IMPRESSION: No mammographic evidence of malignancy. A result letter of this
screening mammogram will be mailed directly to the patient.

RECOMMENDATION:
Screening mammogram in one year. (Code:[GD])

BI-RADS CATEGORY  1: Negative.

## 2016-05-10 DIAGNOSIS — E1165 Type 2 diabetes mellitus with hyperglycemia: Secondary | ICD-10-CM | POA: Diagnosis not present

## 2016-05-10 DIAGNOSIS — M1712 Unilateral primary osteoarthritis, left knee: Secondary | ICD-10-CM | POA: Diagnosis not present

## 2016-05-10 DIAGNOSIS — E0842 Diabetes mellitus due to underlying condition with diabetic polyneuropathy: Secondary | ICD-10-CM | POA: Diagnosis not present

## 2016-05-10 DIAGNOSIS — M179 Osteoarthritis of knee, unspecified: Secondary | ICD-10-CM | POA: Diagnosis not present

## 2016-05-10 DIAGNOSIS — I10 Essential (primary) hypertension: Secondary | ICD-10-CM | POA: Diagnosis not present

## 2016-06-21 DIAGNOSIS — E669 Obesity, unspecified: Secondary | ICD-10-CM | POA: Diagnosis not present

## 2016-06-21 DIAGNOSIS — M179 Osteoarthritis of knee, unspecified: Secondary | ICD-10-CM | POA: Diagnosis not present

## 2016-06-21 DIAGNOSIS — I10 Essential (primary) hypertension: Secondary | ICD-10-CM | POA: Diagnosis not present

## 2016-06-21 DIAGNOSIS — E1165 Type 2 diabetes mellitus with hyperglycemia: Secondary | ICD-10-CM | POA: Diagnosis not present

## 2016-07-07 ENCOUNTER — Encounter (HOSPITAL_COMMUNITY): Payer: Self-pay

## 2016-07-07 DIAGNOSIS — E785 Hyperlipidemia, unspecified: Secondary | ICD-10-CM | POA: Insufficient documentation

## 2016-07-07 DIAGNOSIS — R51 Headache: Secondary | ICD-10-CM | POA: Diagnosis not present

## 2016-07-07 DIAGNOSIS — J181 Lobar pneumonia, unspecified organism: Secondary | ICD-10-CM | POA: Diagnosis present

## 2016-07-07 DIAGNOSIS — R110 Nausea: Secondary | ICD-10-CM | POA: Diagnosis not present

## 2016-07-07 DIAGNOSIS — E119 Type 2 diabetes mellitus without complications: Secondary | ICD-10-CM | POA: Diagnosis not present

## 2016-07-07 DIAGNOSIS — I10 Essential (primary) hypertension: Secondary | ICD-10-CM | POA: Diagnosis not present

## 2016-07-07 DIAGNOSIS — Z880 Allergy status to penicillin: Secondary | ICD-10-CM | POA: Diagnosis not present

## 2016-07-07 DIAGNOSIS — Z87891 Personal history of nicotine dependence: Secondary | ICD-10-CM | POA: Diagnosis not present

## 2016-07-07 DIAGNOSIS — Z7984 Long term (current) use of oral hypoglycemic drugs: Secondary | ICD-10-CM | POA: Insufficient documentation

## 2016-07-07 DIAGNOSIS — Z79899 Other long term (current) drug therapy: Secondary | ICD-10-CM | POA: Diagnosis not present

## 2016-07-07 DIAGNOSIS — R0902 Hypoxemia: Secondary | ICD-10-CM | POA: Diagnosis not present

## 2016-07-07 DIAGNOSIS — J209 Acute bronchitis, unspecified: Secondary | ICD-10-CM | POA: Diagnosis not present

## 2016-07-07 DIAGNOSIS — J189 Pneumonia, unspecified organism: Secondary | ICD-10-CM | POA: Diagnosis not present

## 2016-07-07 MED ORDER — ONDANSETRON 4 MG PO TBDP
4.0000 mg | ORAL_TABLET | Freq: Once | ORAL | Status: AC | PRN
  Administered 2016-07-07: 4 mg via ORAL

## 2016-07-07 MED ORDER — ONDANSETRON 4 MG PO TBDP
ORAL_TABLET | ORAL | Status: AC
  Filled 2016-07-07: qty 1

## 2016-07-07 NOTE — ED Triage Notes (Signed)
Pt states she started having nausea at 12 pm today; pt states she has HA at 5/10 on arrival. No other sx noted at triage; pt a&ox 4 on arrival.

## 2016-07-08 ENCOUNTER — Emergency Department (HOSPITAL_COMMUNITY): Payer: BLUE CROSS/BLUE SHIELD

## 2016-07-08 ENCOUNTER — Other Ambulatory Visit: Payer: Self-pay

## 2016-07-08 ENCOUNTER — Observation Stay (HOSPITAL_COMMUNITY)
Admission: EM | Admit: 2016-07-08 | Discharge: 2016-07-09 | Disposition: A | Discharge status: Home / Self Care | Payer: BLUE CROSS/BLUE SHIELD | Attending: Internal Medicine | Admitting: Internal Medicine

## 2016-07-08 ENCOUNTER — Encounter (HOSPITAL_COMMUNITY): Payer: Self-pay

## 2016-07-08 DIAGNOSIS — I10 Essential (primary) hypertension: Secondary | ICD-10-CM | POA: Diagnosis not present

## 2016-07-08 DIAGNOSIS — R110 Nausea: Secondary | ICD-10-CM | POA: Diagnosis not present

## 2016-07-08 DIAGNOSIS — J209 Acute bronchitis, unspecified: Secondary | ICD-10-CM | POA: Diagnosis not present

## 2016-07-08 DIAGNOSIS — Z79899 Other long term (current) drug therapy: Secondary | ICD-10-CM | POA: Diagnosis not present

## 2016-07-08 DIAGNOSIS — R0902 Hypoxemia: Secondary | ICD-10-CM

## 2016-07-08 DIAGNOSIS — E089 Diabetes mellitus due to underlying condition without complications: Secondary | ICD-10-CM

## 2016-07-08 DIAGNOSIS — E1169 Type 2 diabetes mellitus with other specified complication: Secondary | ICD-10-CM | POA: Diagnosis present

## 2016-07-08 DIAGNOSIS — E785 Hyperlipidemia, unspecified: Secondary | ICD-10-CM | POA: Diagnosis not present

## 2016-07-08 DIAGNOSIS — E119 Type 2 diabetes mellitus without complications: Secondary | ICD-10-CM | POA: Diagnosis present

## 2016-07-08 DIAGNOSIS — R079 Chest pain, unspecified: Secondary | ICD-10-CM | POA: Diagnosis not present

## 2016-07-08 DIAGNOSIS — R519 Headache, unspecified: Secondary | ICD-10-CM

## 2016-07-08 DIAGNOSIS — R51 Headache: Secondary | ICD-10-CM | POA: Diagnosis not present

## 2016-07-08 DIAGNOSIS — J189 Pneumonia, unspecified organism: Secondary | ICD-10-CM | POA: Diagnosis present

## 2016-07-08 DIAGNOSIS — R531 Weakness: Secondary | ICD-10-CM

## 2016-07-08 DIAGNOSIS — J181 Lobar pneumonia, unspecified organism: Secondary | ICD-10-CM

## 2016-07-08 DIAGNOSIS — Z7984 Long term (current) use of oral hypoglycemic drugs: Secondary | ICD-10-CM | POA: Diagnosis not present

## 2016-07-08 DIAGNOSIS — Z880 Allergy status to penicillin: Secondary | ICD-10-CM | POA: Diagnosis not present

## 2016-07-08 LAB — URINALYSIS, ROUTINE W REFLEX MICROSCOPIC
Bilirubin Urine: NEGATIVE
Glucose, UA: NEGATIVE mg/dL
Hgb urine dipstick: NEGATIVE
KETONES UR: NEGATIVE mg/dL
LEUKOCYTES UA: NEGATIVE
NITRITE: NEGATIVE
PH: 5 (ref 5.0–8.0)
Protein, ur: NEGATIVE mg/dL
SPECIFIC GRAVITY, URINE: 1.027 (ref 1.005–1.030)

## 2016-07-08 LAB — COMPREHENSIVE METABOLIC PANEL
ALT: 31 U/L (ref 14–54)
ANION GAP: 12 (ref 5–15)
AST: 27 U/L (ref 15–41)
Albumin: 4 g/dL (ref 3.5–5.0)
Alkaline Phosphatase: 80 U/L (ref 38–126)
BILIRUBIN TOTAL: 0.6 mg/dL (ref 0.3–1.2)
BUN: 15 mg/dL (ref 6–20)
CHLORIDE: 105 mmol/L (ref 101–111)
CO2: 22 mmol/L (ref 22–32)
Calcium: 9 mg/dL (ref 8.9–10.3)
Creatinine, Ser: 0.91 mg/dL (ref 0.44–1.00)
GFR calc Af Amer: >60 mL/min (ref >60)
Glucose, Bld: 214 mg/dL — ABNORMAL HIGH (ref 65–99)
POTASSIUM: 3.8 mmol/L (ref 3.5–5.1)
Sodium: 139 mmol/L (ref 135–145)
Total Protein: 7.2 g/dL (ref 6.5–8.1)

## 2016-07-08 LAB — CBC
HEMATOCRIT: 40.8 % (ref 36.0–46.0)
HEMOGLOBIN: 13.2 g/dL (ref 12.0–15.0)
MCH: 27.6 pg (ref 26.0–34.0)
MCHC: 32.4 g/dL (ref 30.0–36.0)
MCV: 85.4 fL (ref 78.0–100.0)
Platelets: 282 10*3/uL (ref 150–400)
RBC: 4.78 MIL/uL (ref 3.87–5.11)
RDW: 14 % (ref 11.5–15.5)
WBC: 6.6 10*3/uL (ref 4.0–10.5)

## 2016-07-08 LAB — I-STAT CG4 LACTIC ACID, ED: Lactic Acid, Venous: 2.1 mmol/L (ref 0.5–1.9)

## 2016-07-08 LAB — TROPONIN I
Troponin I: <0.03 ng/mL (ref <0.03)
Troponin I: <0.03 ng/mL (ref <0.03)

## 2016-07-08 LAB — LACTIC ACID, PLASMA: LACTIC ACID, VENOUS: 1.7 mmol/L (ref 0.5–1.9)

## 2016-07-08 LAB — GLUCOSE, CAPILLARY
GLUCOSE-CAPILLARY: 138 mg/dL — AB (ref 65–99)
Glucose-Capillary: 167 mg/dL — ABNORMAL HIGH (ref 65–99)
Glucose-Capillary: 153 mg/dL — ABNORMAL HIGH (ref 65–99)

## 2016-07-08 LAB — HIV ANTIBODY (ROUTINE TESTING W REFLEX): HIV SCREEN 4TH GENERATION: NONREACTIVE

## 2016-07-08 LAB — PROCALCITONIN: PROCALCITONIN: 0.28 ng/mL

## 2016-07-08 LAB — LIPASE, BLOOD: LIPASE: 29 U/L (ref 11–51)

## 2016-07-08 IMAGING — CT CT HEAD W/O CM
3 series · 15 of 47 positions shown, 18 images · non-contrast
Comparison: None.

CLINICAL DATA: Acute onset of nausea and headache. Initial
encounter.

EXAM:
CT HEAD WITHOUT CONTRAST
TECHNIQUE: Contiguous axial images were obtained from the base of the skull
through the vertex without intravenous contrast.

[Series 3: head 5.0 h30s · axial · 0.46mm/px · z∈[-173,-38]mm · 9 of 33 slices shown, 12 images]
[im 3/33  brain]
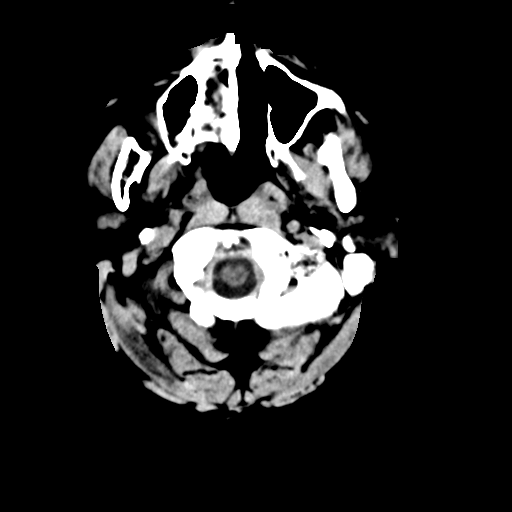
[im 3/33  bone]
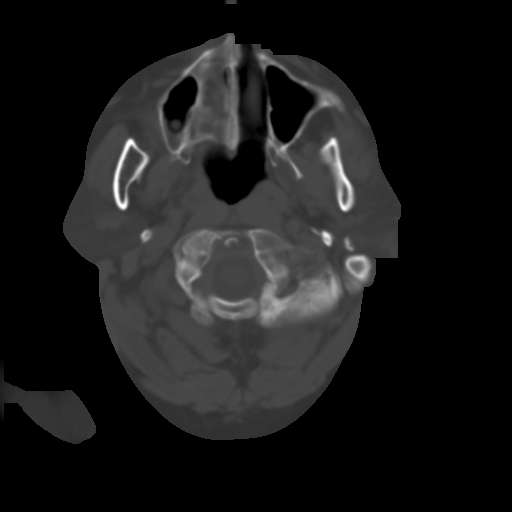
[im 6/33  brain]
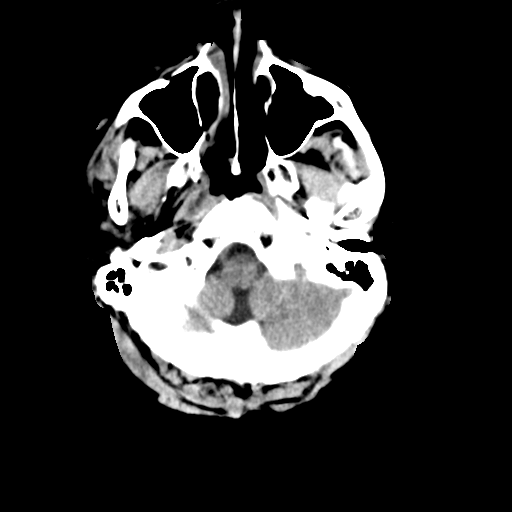
[im 9/33  brain]
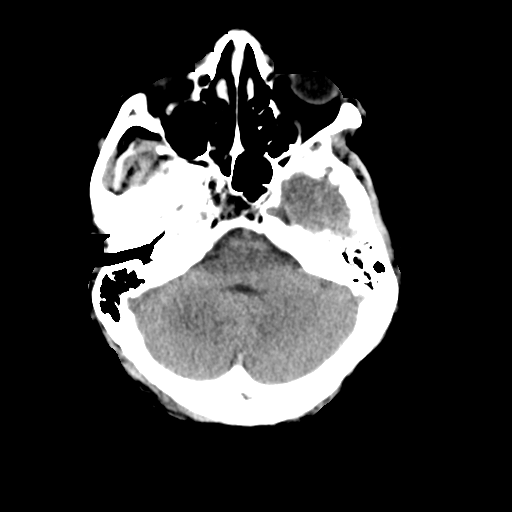
[im 13/33  brain]
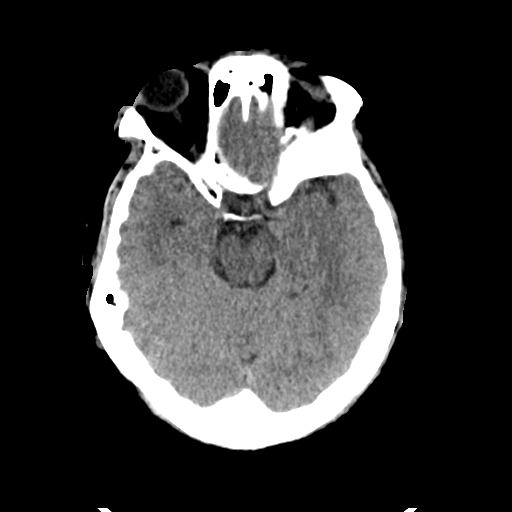
[im 17/33  brain]
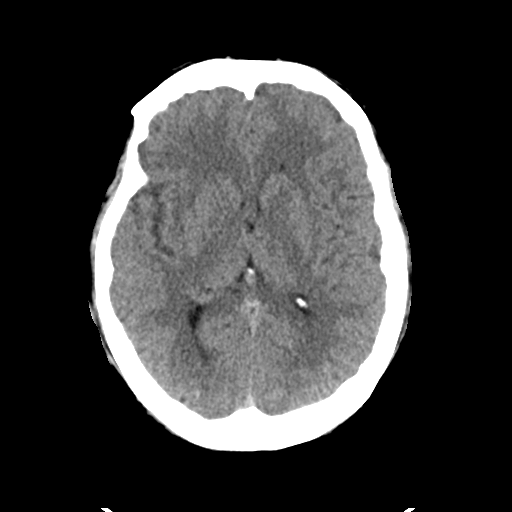
[im 17/33  bone]
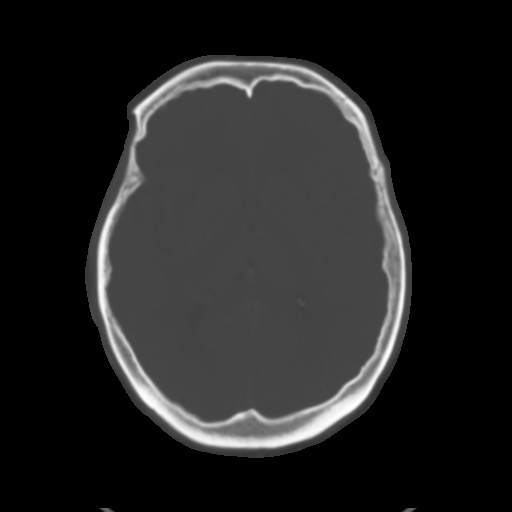
[im 20/33  brain]
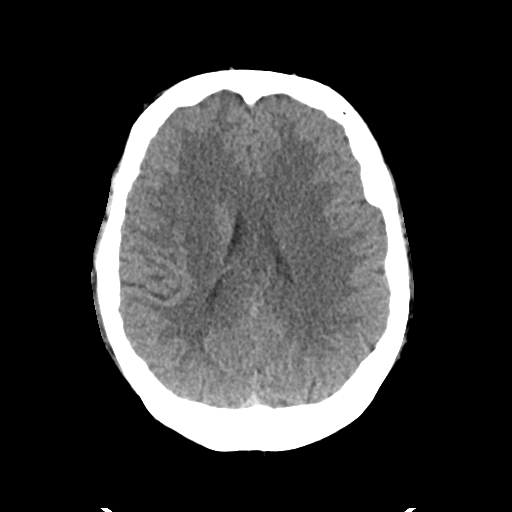
[im 24/33  brain]
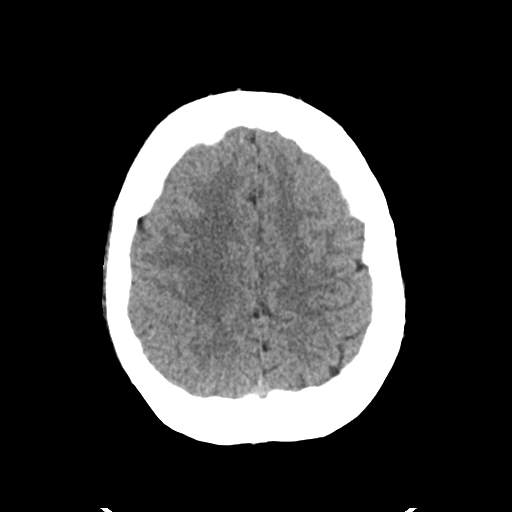
[im 27/33  brain]
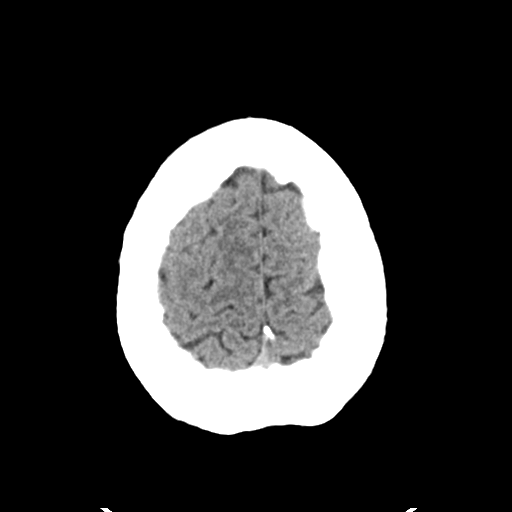
[im 30/33  brain]
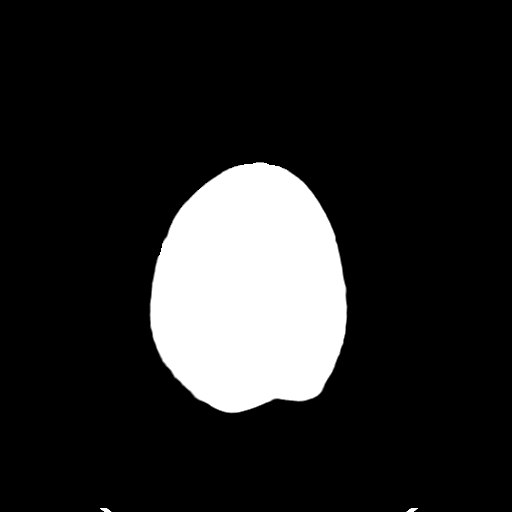
[im 30/33  bone]
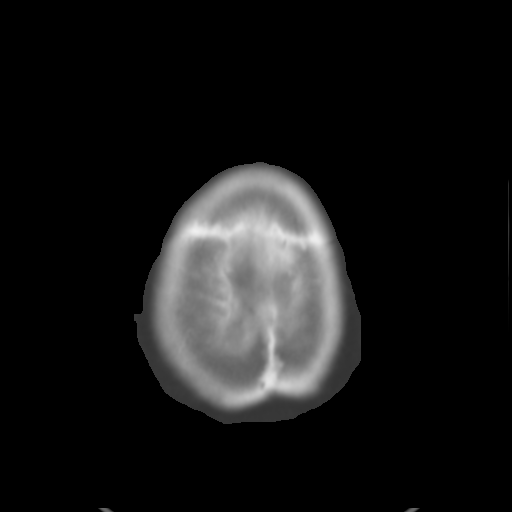

[Series 5: head 3.0 mpr cor · coronal · 0.32mm/px · 3 of 75 slices shown]
[im 25/75  brain]
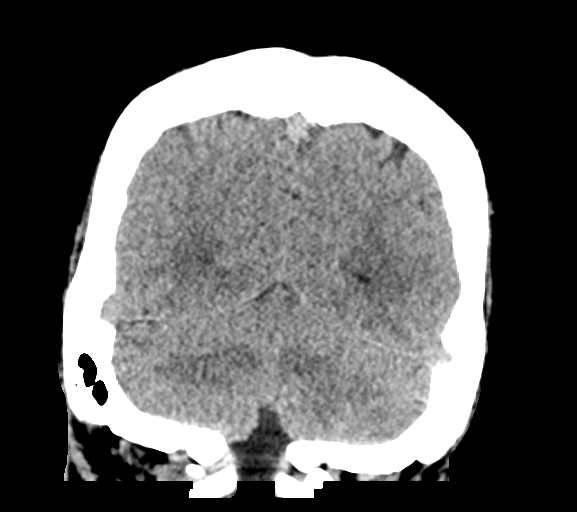
[im 33/75  brain]
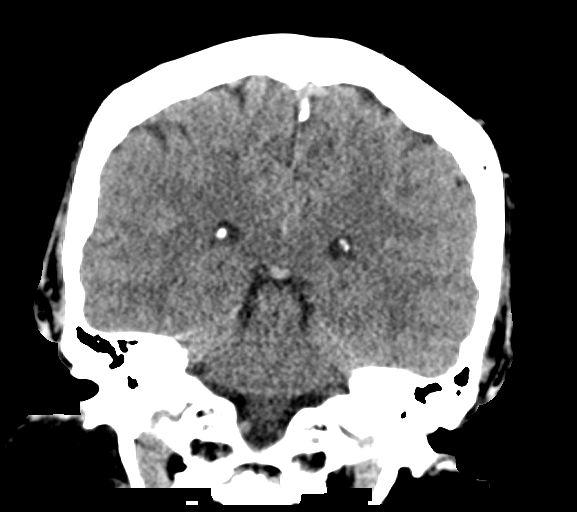
[im 42/75  brain]
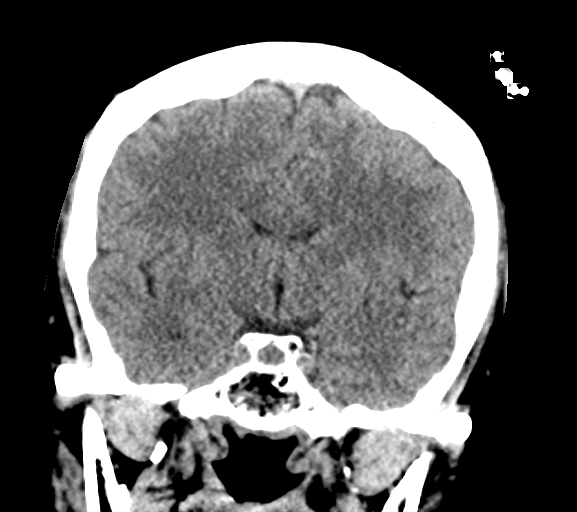

[Series 6: head 3.0 mpr sag · sagittal · 0.32mm/px · 3 of 65 slices shown]
[im 22/65  brain]
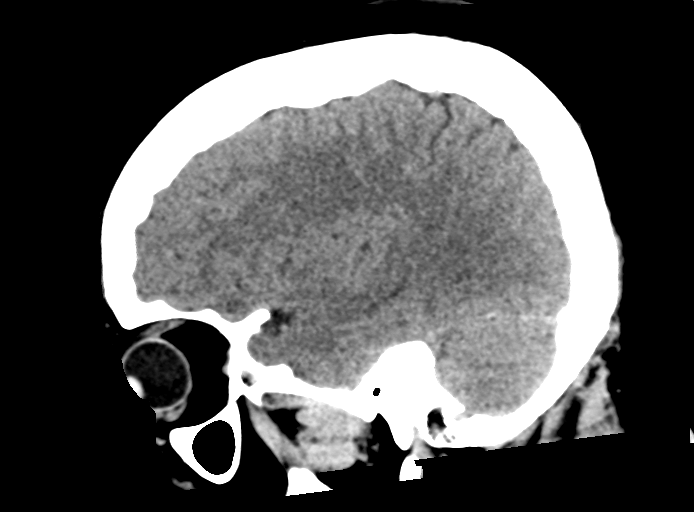
[im 33/65  brain]
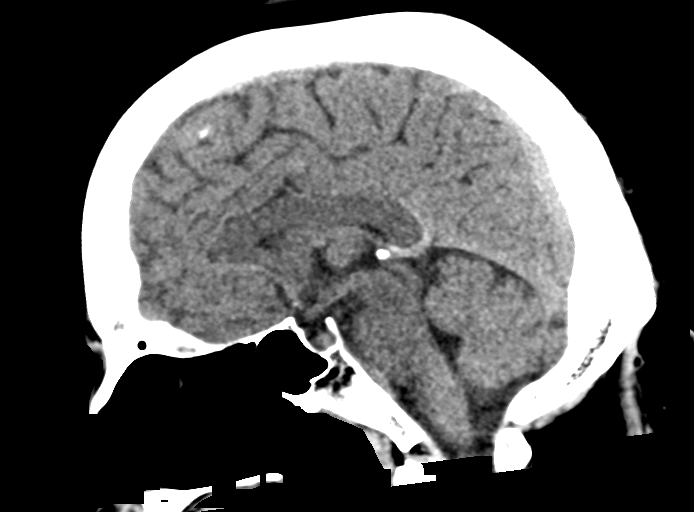
[im 43/65  brain]
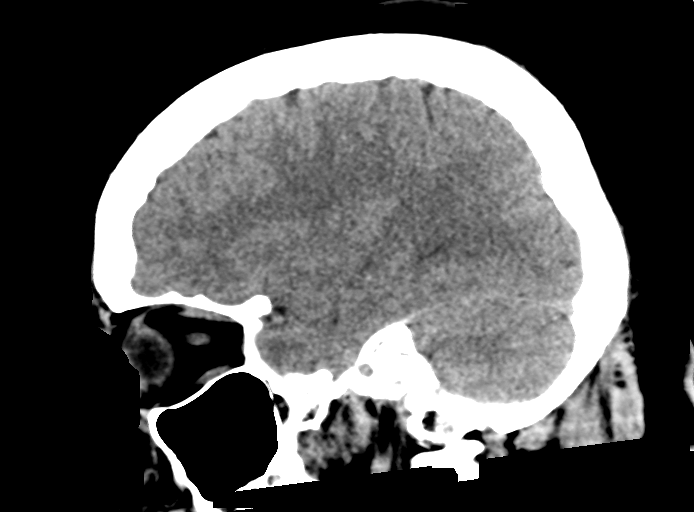

[15 of 47 positions shown; findings below may reference images not displayed]

FINDINGS: Brain: No evidence of acute infarction, hemorrhage, hydrocephalus,
extra-axial collection or mass lesion/mass effect.

The posterior fossa, including the cerebellum, brainstem and fourth
ventricle, is within normal limits. The third and lateral
ventricles, and basal ganglia are unremarkable in appearance. The
cerebral hemispheres are symmetric in appearance, with normal
gray-white differentiation. No mass effect or midline shift is seen.

Vascular: No hyperdense vessel or unexpected calcification.

Skull: There is no evidence of fracture; visualized osseous
structures are unremarkable in appearance.

Sinuses/Orbits: The orbits are within normal limits. The paranasal
sinuses and mastoid air cells are well-aerated.

Other: No significant soft tissue abnormalities are seen.
IMPRESSION: Unremarkable noncontrast CT of the head.

## 2016-07-08 IMAGING — CR DG CHEST 2V
2 series · 2 of 2 positions shown · non-contrast
Comparison: Chest radiograph performed [DATE]

CLINICAL DATA: Acute onset of nausea and headache. Fever. Initial
encounter.

EXAM:
CHEST  2 VIEW

[chest pa]
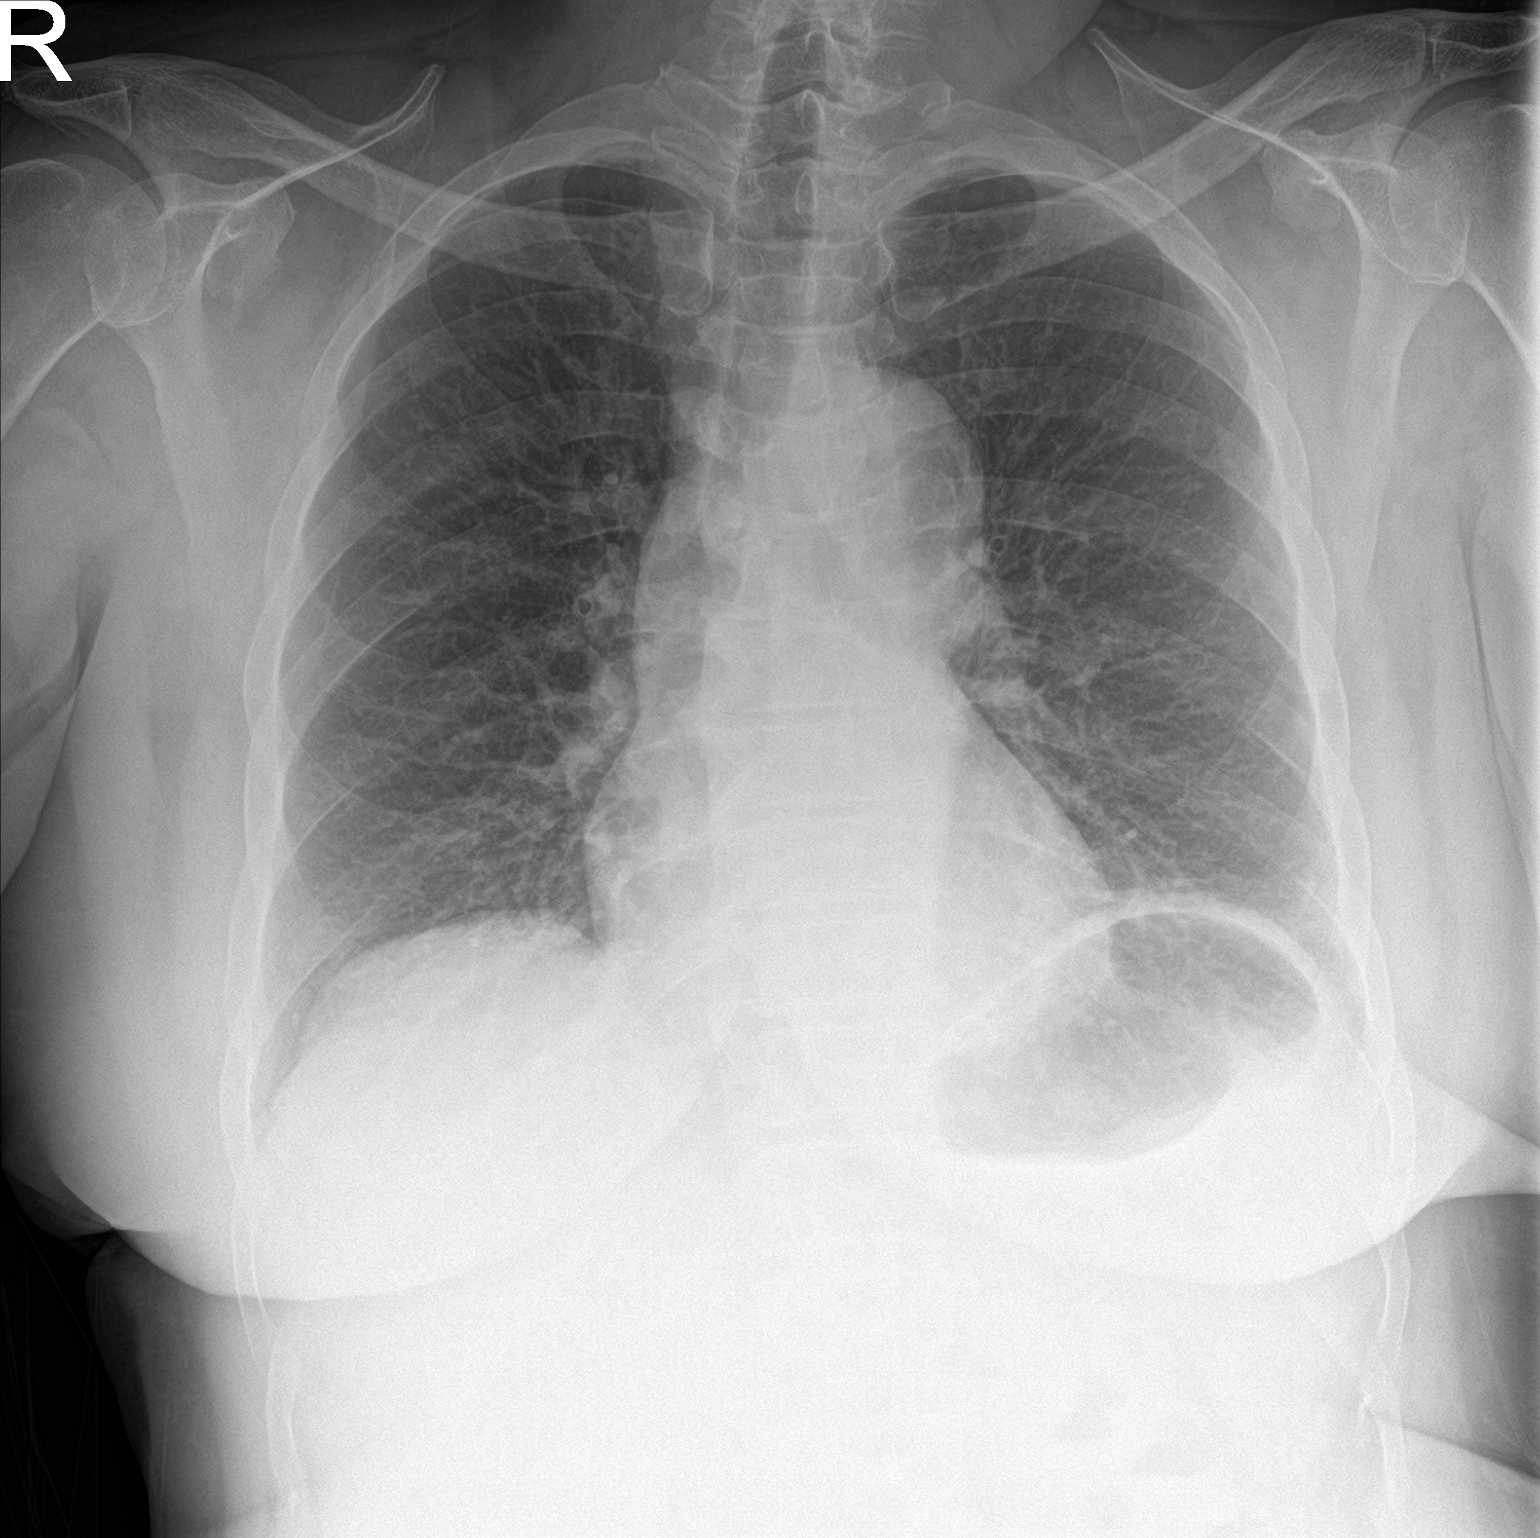

[chest lat]
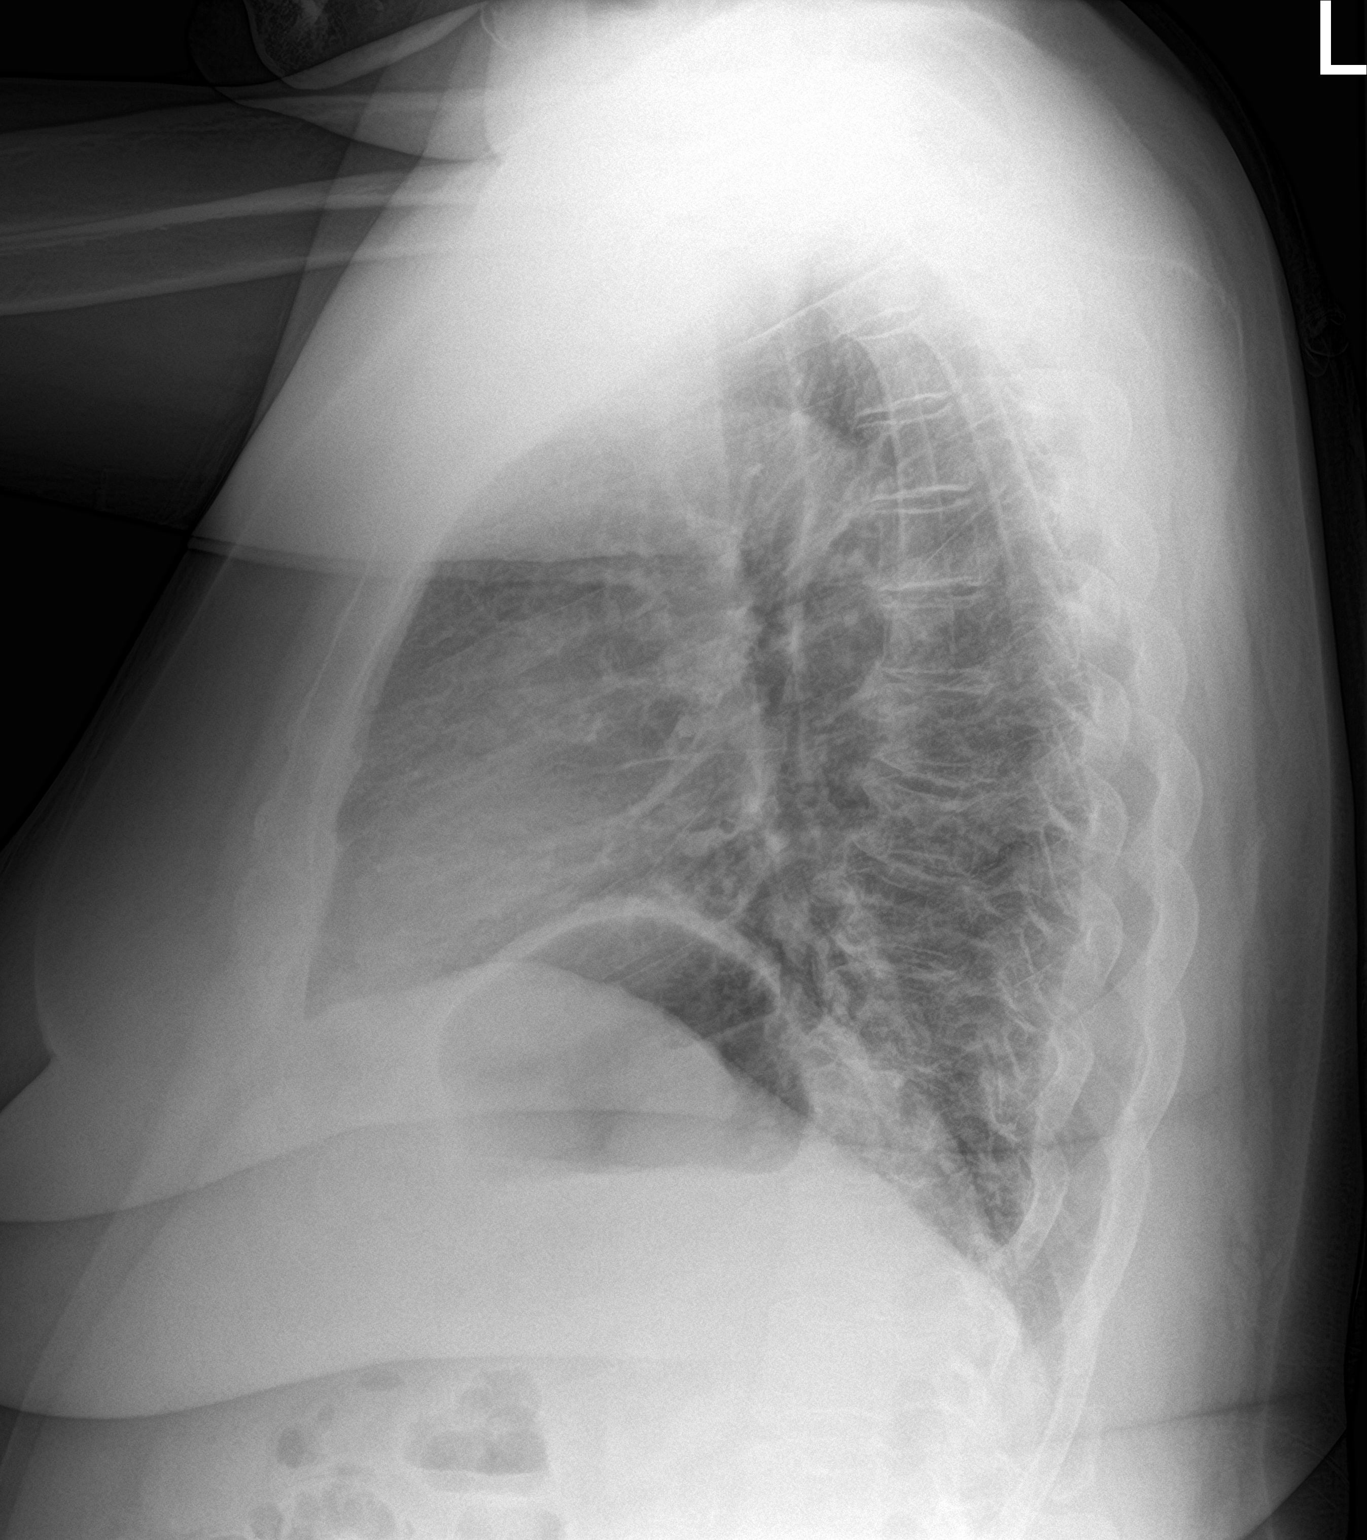

[2 of 2 positions shown; findings below may reference images not displayed]

FINDINGS: The lungs are well-aerated. Mild left basilar opacity may reflect
atelectasis or possibly mild infection, depending on the patient's
symptoms. Mild peribronchial thickening is seen. There is no
evidence of pleural effusion or pneumothorax.

The heart is normal in size; the mediastinal contour is within
normal limits. No acute osseous abnormalities are seen.
IMPRESSION: Mild left basilar airspace opacity may reflect atelectasis or
possibly mild infection, depending on the patient's symptoms. Mild
peribronchial thickening noted.

## 2016-07-08 MED ORDER — SODIUM CHLORIDE 0.9 % IV SOLN
INTRAVENOUS | Status: DC
  Administered 2016-07-08 – 2016-07-09 (×3): via INTRAVENOUS

## 2016-07-08 MED ORDER — LEVOFLOXACIN IN D5W 750 MG/150ML IV SOLN
750.0000 mg | Freq: Once | INTRAVENOUS | Status: AC
  Administered 2016-07-08: 750 mg via INTRAVENOUS
  Filled 2016-07-08: qty 150

## 2016-07-08 MED ORDER — SODIUM CHLORIDE 0.9 % IV BOLUS (SEPSIS)
1000.0000 mL | Freq: Once | INTRAVENOUS | Status: AC
  Administered 2016-07-08: 1000 mL via INTRAVENOUS

## 2016-07-08 MED ORDER — ACETAMINOPHEN 325 MG PO TABS
650.0000 mg | ORAL_TABLET | Freq: Once | ORAL | Status: AC
  Administered 2016-07-08: 650 mg via ORAL
  Filled 2016-07-08: qty 2

## 2016-07-08 MED ORDER — GABAPENTIN 300 MG PO CAPS
300.0000 mg | ORAL_CAPSULE | Freq: Three times a day (TID) | ORAL | Status: DC
  Administered 2016-07-08 – 2016-07-09 (×5): 300 mg via ORAL
  Filled 2016-07-08 (×5): qty 1

## 2016-07-08 MED ORDER — ENOXAPARIN SODIUM 40 MG/0.4ML ~~LOC~~ SOLN
40.0000 mg | SUBCUTANEOUS | Status: DC
  Administered 2016-07-08 – 2016-07-09 (×2): 40 mg via SUBCUTANEOUS
  Filled 2016-07-08 (×3): qty 0.4

## 2016-07-08 MED ORDER — INSULIN ASPART 100 UNIT/ML ~~LOC~~ SOLN
0.0000 [IU] | Freq: Three times a day (TID) | SUBCUTANEOUS | Status: DC
  Administered 2016-07-08: 3 [IU] via SUBCUTANEOUS
  Administered 2016-07-08 – 2016-07-09 (×2): 2 [IU] via SUBCUTANEOUS
  Administered 2016-07-09: 3 [IU] via SUBCUTANEOUS

## 2016-07-08 MED ORDER — ATORVASTATIN CALCIUM 20 MG PO TABS
20.0000 mg | ORAL_TABLET | Freq: Every day | ORAL | Status: DC
  Administered 2016-07-08 – 2016-07-09 (×2): 20 mg via ORAL
  Filled 2016-07-08 (×2): qty 1

## 2016-07-08 MED ORDER — METOCLOPRAMIDE HCL 5 MG/ML IJ SOLN
10.0000 mg | Freq: Once | INTRAMUSCULAR | Status: AC
  Administered 2016-07-08: 10 mg via INTRAVENOUS
  Filled 2016-07-08: qty 2

## 2016-07-08 MED ORDER — INSULIN ASPART 100 UNIT/ML ~~LOC~~ SOLN: 0.0000 [IU] | Freq: Every day | SUBCUTANEOUS | Status: DC

## 2016-07-08 MED ORDER — ACETAMINOPHEN 325 MG PO TABS
650.0000 mg | ORAL_TABLET | Freq: Four times a day (QID) | ORAL | Status: DC | PRN
  Administered 2016-07-09: 650 mg via ORAL
  Filled 2016-07-08: qty 2

## 2016-07-08 MED ORDER — ACETAMINOPHEN 650 MG RE SUPP: 650.0000 mg | Freq: Four times a day (QID) | RECTAL | Status: DC | PRN

## 2016-07-08 MED ORDER — DIPHENHYDRAMINE HCL 50 MG/ML IJ SOLN
25.0000 mg | Freq: Once | INTRAMUSCULAR | Status: AC
  Administered 2016-07-08: 25 mg via INTRAVENOUS
  Filled 2016-07-08: qty 1

## 2016-07-08 NOTE — ED Notes (Signed)
Pt placed on 2L of O2 b/c her O2 levels drop to 88% while she is sleeping.

## 2016-07-08 NOTE — ED Provider Notes (Signed)
[facility] DEPT Provider Note   CSN: [id number] Arrival date & time: 07/07/16  2324   By signing my name below, I, Soijett Blue, attest that this documentation has been prepared under the direction and in the presence of Drenda Freeze, MD. Electronically Signed: Soijett Blue, ED Scribe. 07/08/16. 3:39 AM.,  History   Chief Complaint Chief Complaint  Patient presents with  . Headache  . Nausea    HPI Megan Lane is a 61 y.o. female with a PMHx of DM, HTN, who presents to the Emergency Department complaining of throbbing, generalized HA onset noon yesterday. Pt reports associated nausea, vomiting, abdominal pain due to excessive vomiting, posterior neck pain, and chills. Pt has not tried any medications for the relief of her symptoms. Denies fever, dysuria, cough, rash, recent tick bites, and any other symptoms. She denies family hx of aneurysms, but reports family hx of ovarian CA.     The history is provided by the patient. No language interpreter was used.    Past Medical History:  Diagnosis Date  . Diabetes mellitus   . Hypertension     Patient Active Problem List   Diagnosis Date Noted  . Diabetes mellitus due to underlying condition without complications ([employer]) 33/35/4562  . Back muscle spasm 04/30/2013  . Nasal congestion 04/30/2013  . Essential hypertension, benign 04/30/2013  . Pap smear for cervical cancer screening 03/20/2013    Past Surgical History:  Procedure Laterality Date  . ABDOMINAL HYSTERECTOMY    . KNEE SURGERY      OB History    No data available       Home Medications    Prior to Admission medications   Medication Sig Start Date End Date Taking? Authorizing Provider  atorvastatin (LIPITOR) 20 MG tablet Take 1 tablet (20 mg total) by mouth daily. 02/09/15   Lance Bosch, NP  benzonatate (TESSALON) 100 MG capsule Take 1 capsule (100 mg total) by mouth 3 (three) times daily as needed for cough. Patient not taking: Reported on  08/12/2014 04/30/13   Lorayne Marek, MD  lisinopril (PRINIVIL,ZESTRIL) 10 MG tablet Take 2 tablets (20 mg total) by mouth daily. 02/09/15   Lance Bosch, NP  metFORMIN (GLUCOPHAGE) 1000 MG tablet Take 1 tablet (1,000 mg total) by mouth 2 (two) times daily with a meal. 02/09/15   Lance Bosch, NP  traMADol (ULTRAM) 50 MG tablet Take 1 tablet (50 mg total) by mouth every 8 (eight) hours as needed. 03/30/15   Lance Bosch, NP  Vitamin D, Ergocalciferol, (DRISDOL) 50000 UNITS CAPS capsule Take 1 capsule (50,000 Units total) by mouth every 7 (seven) days. 08/13/14   Lorayne Marek, MD    Family History Family History  Problem Relation Age of Onset  . Diabetes Mother   . Hypertension Mother   . Stroke Mother   . Heart disease Brother     Social History Social History  Substance Use Topics  . Smoking status: Former Smoker    Quit date: 08/11/2005  . Smokeless tobacco: Not on file  . Alcohol use No     Allergies   Penicillins   Review of Systems Review of Systems  Constitutional: Positive for chills. Negative for fever.  Respiratory: Negative for cough.   Gastrointestinal: Positive for abdominal pain, nausea and vomiting.  Genitourinary: Negative for dysuria.  Musculoskeletal: Positive for neck pain (posterior).  Skin: Negative for rash.  Neurological: Positive for headaches.  All other systems reviewed and are negative.  Physical Exam Updated Vital Signs BP (!) 152/99 (BP Location: Right Arm)   Pulse (!) 131   Temp 100.2 F (37.9 C) (Oral)   Resp (!) 24   SpO2 96%   Physical Exam  Constitutional: She is oriented to person, place, and time. She appears well-developed and well-nourished. No distress.  HENT:  Head: Normocephalic and atraumatic.  Mouth/Throat: Mucous membranes are dry.  Eyes: EOM are normal.  Neck: Neck supple.  No meningeal signs  Cardiovascular: Normal rate, regular rhythm and normal heart sounds.  Exam reveals no gallop and no friction rub.   No  murmur heard. Pulmonary/Chest: Effort normal and breath sounds normal. No respiratory distress. She has no wheezes. She has no rales.  Abdominal: Soft. She exhibits no distension. There is no tenderness.  Umbilical hernia site, with a small hernia that is easily reducible.   Musculoskeletal: Normal range of motion.  Neurological: She is alert and oriented to person, place, and time.  Skin: Skin is warm and dry.  Psychiatric: She has a normal mood and affect. Her behavior is normal.  Nursing note and vitals reviewed.    ED Treatments / Results  DIAGNOSTIC STUDIES: Oxygen Saturation is 96% on RA, nl by my interpretation.    COORDINATION OF CARE: 3:39 AM Discussed treatment plan with pt at bedside and pt agreed to plan.   Labs (all labs ordered are listed, but only abnormal results are displayed) Labs Reviewed  COMPREHENSIVE METABOLIC PANEL - Abnormal; Notable for the following:       Result Value   Glucose, Bld 214 (*)    All other components within normal limits  URINALYSIS, ROUTINE W REFLEX MICROSCOPIC - Abnormal; Notable for the following:    APPearance HAZY (*)    All other components within normal limits  I-STAT CG4 LACTIC ACID, ED - Abnormal; Notable for the following:    Lactic Acid, Venous 2.10 (*)    All other components within normal limits  CULTURE, BLOOD (ROUTINE X 2)  CULTURE, BLOOD (ROUTINE X 2)  LIPASE, BLOOD  CBC  LACTIC ACID, PLASMA    EKG  EKG Interpretation None       Radiology Dg Chest 2 View  Result Date: 07/08/2016 CLINICAL DATA:  Acute onset of nausea and headache. Fever. Initial encounter. EXAM: CHEST  2 VIEW COMPARISON:  Chest radiograph performed 10/27/2013 FINDINGS: The lungs are well-aerated. Mild left basilar opacity may reflect atelectasis or possibly mild infection, depending on the patient's symptoms. Mild peribronchial thickening is seen. There is no evidence of pleural effusion or pneumothorax. The heart is normal in size; the  mediastinal contour is within normal limits. No acute osseous abnormalities are seen. IMPRESSION: Mild left basilar airspace opacity may reflect atelectasis or possibly mild infection, depending on the patient's symptoms. Mild peribronchial thickening noted. Electronically Signed   By: Garald Balding M.D.   On: 07/08/2016 04:09   Ct Head Wo Contrast  Result Date: 07/08/2016 CLINICAL DATA:  Acute onset of nausea and headache. Initial encounter. EXAM: CT HEAD WITHOUT CONTRAST TECHNIQUE: Contiguous axial images were obtained from the base of the skull through the vertex without intravenous contrast. COMPARISON:  None. FINDINGS: Brain: No evidence of acute infarction, hemorrhage, hydrocephalus, extra-axial collection or mass lesion/mass effect. The posterior fossa, including the cerebellum, brainstem and fourth ventricle, is within normal limits. The third and lateral ventricles, and basal ganglia are unremarkable in appearance. The cerebral hemispheres are symmetric in appearance, with normal gray-white differentiation. No mass effect or midline shift  is seen. Vascular: No hyperdense vessel or unexpected calcification. Skull: There is no evidence of fracture; visualized osseous structures are unremarkable in appearance. Sinuses/Orbits: The orbits are within normal limits. The paranasal sinuses and mastoid air cells are well-aerated. Other: No significant soft tissue abnormalities are seen. IMPRESSION: Unremarkable noncontrast CT of the head. Electronically Signed   By: Garald Balding M.D.   On: 07/08/2016 04:08    Procedures Procedures (including critical care time)  Medications Ordered in ED Medications  ondansetron (ZOFRAN-ODT) 4 MG disintegrating tablet (not administered)  levofloxacin (LEVAQUIN) IVPB 750 mg (750 mg Intravenous New Bag/Given 07/08/16 0501)  sodium chloride 0.9 % bolus 1,000 mL (not administered)  ondansetron (ZOFRAN-ODT) disintegrating tablet 4 mg (4 mg Oral Given 07/07/16 2354)  sodium  chloride 0.9 % bolus 1,000 mL (1,000 mLs Intravenous New Bag/Given 07/08/16 0421)  acetaminophen (TYLENOL) tablet 650 mg (650 mg Oral Given 07/08/16 0428)  metoCLOPramide (REGLAN) injection 10 mg (10 mg Intravenous Given 07/08/16 0422)  diphenhydrAMINE (BENADRYL) injection 25 mg (25 mg Intravenous Given 07/08/16 0425)     Initial Impression / Assessment and Plan / ED Course  I have reviewed the triage vital signs and the nursing notes.  Pertinent labs & imaging results that were available during my care of the patient were reviewed by me and considered in my medical decision making (see chart for details).     Megan Lane is a 61 y.o. female here with chills, headaches, myalgias. No meningeal signs on exam. Low grade temp and tachycardic on arrival. Meets sepsis criteria. Will get labs, lactate, CXR, UA. Will give IVF.   6:02 AM WBC 6. However, lactate 2.1. CXR showed possible pneumonia. Still tachy low 100s. When she sleeps, she desat to 86-89% on RA. Ordered blood cultures. Will admit for CAP with hypoxia.    Final Clinical Impressions(s) / ED Diagnoses   Final diagnoses:  None    New Prescriptions New Prescriptions   No medications on file   I personally performed the services described in this documentation, which was scribed in my presence. The recorded information has been reviewed and is accurate.    Drenda Freeze, MD 07/08/16 [phone]

## 2016-07-08 NOTE — H&P (Signed)
Date: 07/08/2016               Patient Name:  Megan Lane MRN: [id number]  DOB: 03/08/1955 Age / Sex: 61 y.o., female   PCP: [facility], [facility]         Medical Service: Internal Medicine Teaching Service         Attending Physician: Dr. Sid Falcon, MD    First Contact: Dr. Hetty Ely  Pager: [phone]  Second Contact: Dr. Juleen China  Pager: [phone]       After Hours (After 5p/  First Contact Pager: [phone]  weekends / holidays): Second Contact Pager: [phone]   Chief Complaint: weakness   History of Present Illness: 61 yo woman with PMH DM, HTN, HLD presents with nausea and retching without vomit, chest pain, headache, chills and feeling weak. These symptoms began yesterday at noon while she was at work, she works in the Product/process development scientist at [employer]. The symptoms have mostly resolved but she still feels weak and has needed assistance with getting to the restroom. She describes the chest pain as a sensation of small pinching needles, she would feel it when she was breathing but it resolved when IV fluids were started in the ED. The pain is not radiating, she denies recent travel, leg swelling or difficulty breathing. One of her coworkers was describing the same symptoms, she's never had symptoms like this in the past.   In the ED she was found to have an initial temp 100.2 and HR 130 which improved to 90s. Labs revealed lactic acid 2.1, WBC 6.6, and urinalysis was reassuring against UTI. Chest xray was read as showing a left basilar airspace opacity with concern for atelectasis vs mild infection. Blood cultures were drawn and she was given a dose of levofloxacin for suspected CAP, reglan, zofran, and 2 L NS bolus. Lactic acid improved to 1.7 at the time of admission.    Meds:  Current Meds  Medication Sig  . gabapentin (NEURONTIN) 100 MG capsule Take 200 mg by mouth 3 (three) times daily.  Marland Kitchen gabapentin (NEURONTIN) 300 MG capsule Take 300 mg by mouth 3 (three) times daily.  Marland Kitchen  lisinopril (PRINIVIL,ZESTRIL) 10 MG tablet Take 2 tablets (20 mg total) by mouth daily. (Patient taking differently: Take 10 mg by mouth daily. )  . lisinopril-hydrochlorothiazide (PRINZIDE,ZESTORETIC) 10-12.5 MG tablet Take 1 tablet by mouth daily.  . meloxicam (MOBIC) 15 MG tablet Take 15 mg by mouth daily.  . metFORMIN (GLUCOPHAGE) 1000 MG tablet Take 1 tablet (1,000 mg total) by mouth 2 (two) times daily with a meal.  . Vitamin D, Ergocalciferol, (DRISDOL) 50000 UNITS CAPS capsule Take 1 capsule (50,000 Units total) by mouth every 7 (seven) days.    Allergies: Allergies as of 07/07/2016 - Review Complete 07/07/2016  Allergen Reaction Noted  . Penicillins Rash 10/31/2011   Past Medical History:  Diagnosis Date  . Diabetes mellitus   . Hypertension    Family History:  Mother - stroke, two brothers had MI in their early 48s  Social History: Former smoker, she quit in 2007 but previously smoked 1-5 packs per day for 35 years. Denies alcohol or illicit drug use.   Review of Systems: A complete ROS was negative except as per HPI.   Physical Exam: Blood pressure 116/76, pulse 96, temperature 98.6 F (37 C), resp. rate (!) 24, SpO2 97 %. Physical Exam  Constitutional: She is oriented to person, place, and time. She appears well-developed  and well-nourished. No distress.  Non septic appearing   HENT:  Head: Normocephalic and atraumatic.  Mouth/Throat: Oropharynx is clear and moist. No oropharyngeal exudate.  Eyes: Conjunctivae are normal. No scleral icterus.  Cardiovascular: Normal rate, regular rhythm and intact distal pulses.   No murmur heard. Pulmonary/Chest: Effort normal. No respiratory distress. She has no wheezes. She has no rales.  Abdominal: Soft. Bowel sounds are normal. She exhibits no distension. There is no tenderness. There is no guarding.  Neurological: She is alert and oriented to person, place, and time.  Skin: Skin is warm and dry. She is not diaphoretic.    Psychiatric: She has a normal mood and affect. Her behavior is normal.   Labs: Na 139. K 3.8, Cl 105, bicarb 22, BUN 15, crt 0.91, glucose 214  Alk phos 80, AST 27, ALT 31, t bili 0.6  WBC 6.6, hgb 13.2, plt 282  Urine - no nitrites or leukocyte esterase Lactate 2.1 >1.7   Personal review of the CT head reveals no acute abnormalities.  Personal review of the chest xray reveals no acute cardio pulmonary disease.   Assessment & Plan by Problem: Principal Problem:   Weakness Active Problems:   Essential hypertension, benign   Diabetes mellitus due to underlying condition without complications (HCC)   CAP (community acquired pneumonia)   Chest pain   Headache   HLD (hyperlipidemia)  Weakness  One day prior to admission she developed nausea, headache, chest pain, and chills associated with generalized weakness. Her symptoms have mostly resolved since her presentation to the ED but she is left with residual weakness. At this point I think it is most likely that she caught a viral illness from a coworker. At initial presentation she did have fever, tachycardia, and an elevated lactic acid however these have all stabilized. There was some concern for an opacity on chest xray however clinically she does not appear to have a pneumonia, She received a dose of levofloxacin in the ED and we will repeat a chest xray tomorrow morning.  - Follow up blood cx  - Follow up procalcitonin - Follow up chest xray tomorrow morning   Chest pain  She describes an atypical pinching chest pain which resolved with arrival to the ED and beginning IV fluids. Given her medical history of multiple risk factors ( tobacco use, HTN, HLD, DM, obesity) we will trend trops.  - trending troponin  -EKG ordered and in process  Hypertension  Currently she is normotensive, we will hold home meds lisinopril HCTZ 10-12.5 mg daily but these can be continued if she becomes hypertensive.   Hyperlipidemia  - ordered home med  atorvastatin   Diabetes  Home med is metformin 1000 mg BID  - ordered ISS moderate with HS coverage  - ordered home med gabapentin 300 mg TID    DVT PPX: lovenox   Dispo: Admit patient to Observation with expected length of stay less than 2 midnights.  Signed: Ledell Noss, MD 07/08/2016, 10:26 AM  Pager: [phone]

## 2016-07-08 NOTE — Progress Notes (Addendum)
Megan Lane is a 61 y.o. female patient admitted from ED awake, alert - oriented  X 4 - no acute distress noted.  VSS - Blood pressure 130/90, pulse 87, temperature 98.9 F (37.2 C), temperature source Oral, resp. rate 20, SpO2 98 %.    IV in place, occlusive dsg intact without redness.  Orientation to room, and floor completed with information packet given to patient/family.  Patient watched safety video at this time.  Admission INP armband ID verified with patient/family, and in place.   SR up x 2, fall assessment complete, with patient and family able to verbalize understanding of risk associated with falls, and verbalized understanding to call nsg before up out of bed.  Call light within reach, patient able to voice, and demonstrate understanding.  Skin, clean-dry- intact without evidence of bruising, or skin tears.   No evidence of skin break down noted on exam.     Will cont to eval and treat per MD orders.  Celine Ahr, RN 07/08/2016 8:39 AM

## 2016-07-08 NOTE — ED Notes (Signed)
Informed provider of lactic acid.   

## 2016-07-08 NOTE — ED Notes (Signed)
Patient transported to CT 

## 2016-07-09 ENCOUNTER — Other Ambulatory Visit: Payer: Self-pay

## 2016-07-09 ENCOUNTER — Observation Stay (HOSPITAL_COMMUNITY): Payer: BLUE CROSS/BLUE SHIELD

## 2016-07-09 DIAGNOSIS — E089 Diabetes mellitus due to underlying condition without complications: Secondary | ICD-10-CM | POA: Diagnosis not present

## 2016-07-09 DIAGNOSIS — R079 Chest pain, unspecified: Secondary | ICD-10-CM | POA: Diagnosis not present

## 2016-07-09 DIAGNOSIS — J209 Acute bronchitis, unspecified: Secondary | ICD-10-CM | POA: Diagnosis not present

## 2016-07-09 DIAGNOSIS — I10 Essential (primary) hypertension: Secondary | ICD-10-CM | POA: Diagnosis not present

## 2016-07-09 DIAGNOSIS — Z880 Allergy status to penicillin: Secondary | ICD-10-CM | POA: Diagnosis not present

## 2016-07-09 DIAGNOSIS — Z79899 Other long term (current) drug therapy: Secondary | ICD-10-CM | POA: Diagnosis not present

## 2016-07-09 DIAGNOSIS — J189 Pneumonia, unspecified organism: Secondary | ICD-10-CM | POA: Diagnosis not present

## 2016-07-09 DIAGNOSIS — Z7984 Long term (current) use of oral hypoglycemic drugs: Secondary | ICD-10-CM | POA: Diagnosis not present

## 2016-07-09 LAB — BLOOD CULTURE ID PANEL (REFLEXED)
ACINETOBACTER BAUMANNII: NOT DETECTED
CANDIDA ALBICANS: NOT DETECTED
CANDIDA GLABRATA: NOT DETECTED
CANDIDA KRUSEI: NOT DETECTED
CANDIDA PARAPSILOSIS: NOT DETECTED
Candida tropicalis: NOT DETECTED
ENTEROBACTER CLOACAE COMPLEX: NOT DETECTED
ENTEROCOCCUS SPECIES: NOT DETECTED
ESCHERICHIA COLI: NOT DETECTED
Enterobacteriaceae species: NOT DETECTED
Haemophilus influenzae: NOT DETECTED
KLEBSIELLA OXYTOCA: NOT DETECTED
Klebsiella pneumoniae: NOT DETECTED
LISTERIA MONOCYTOGENES: NOT DETECTED
Methicillin resistance: NOT DETECTED
Neisseria meningitidis: NOT DETECTED
PSEUDOMONAS AERUGINOSA: NOT DETECTED
Proteus species: NOT DETECTED
STREPTOCOCCUS AGALACTIAE: NOT DETECTED
STREPTOCOCCUS PNEUMONIAE: NOT DETECTED
STREPTOCOCCUS PYOGENES: NOT DETECTED
Serratia marcescens: NOT DETECTED
Staphylococcus aureus (BCID): NOT DETECTED
Staphylococcus species: DETECTED — AB
Streptococcus species: NOT DETECTED

## 2016-07-09 LAB — BASIC METABOLIC PANEL
Anion gap: 6 (ref 5–15)
BUN: 10 mg/dL (ref 6–20)
CHLORIDE: 110 mmol/L (ref 101–111)
CO2: 22 mmol/L (ref 22–32)
Calcium: 7.7 mg/dL — ABNORMAL LOW (ref 8.9–10.3)
Creatinine, Ser: 0.8 mg/dL (ref 0.44–1.00)
GFR calc Af Amer: >60 mL/min (ref >60)
GFR calc non Af Amer: >60 mL/min (ref >60)
GLUCOSE: 137 mg/dL — AB (ref 65–99)
POTASSIUM: 3.5 mmol/L (ref 3.5–5.1)
Sodium: 138 mmol/L (ref 135–145)

## 2016-07-09 LAB — CBC
HCT: 35.4 % — ABNORMAL LOW (ref 36.0–46.0)
HEMOGLOBIN: 11.3 g/dL — AB (ref 12.0–15.0)
MCH: 27.8 pg (ref 26.0–34.0)
MCHC: 31.9 g/dL (ref 30.0–36.0)
MCV: 87.2 fL (ref 78.0–100.0)
Platelets: 230 10*3/uL (ref 150–400)
RBC: 4.06 MIL/uL (ref 3.87–5.11)
RDW: 14.2 % (ref 11.5–15.5)
WBC: 4.5 10*3/uL (ref 4.0–10.5)

## 2016-07-09 LAB — GLUCOSE, CAPILLARY
GLUCOSE-CAPILLARY: 165 mg/dL — AB (ref 65–99)
Glucose-Capillary: 139 mg/dL — ABNORMAL HIGH (ref 65–99)

## 2016-07-09 LAB — HEMOGLOBIN A1C
Hgb A1c MFr Bld: 7.5 % — ABNORMAL HIGH (ref 4.8–5.6)
Mean Plasma Glucose: 169 mg/dL

## 2016-07-09 IMAGING — DX DG CHEST 2V
2 series · 2 of 2 positions shown · non-contrast
Comparison: [DATE]

CLINICAL DATA: Community acquired pneumonia. Worsened shortness of
breath.

EXAM:
CHEST  2 VIEW

[w chest pa]
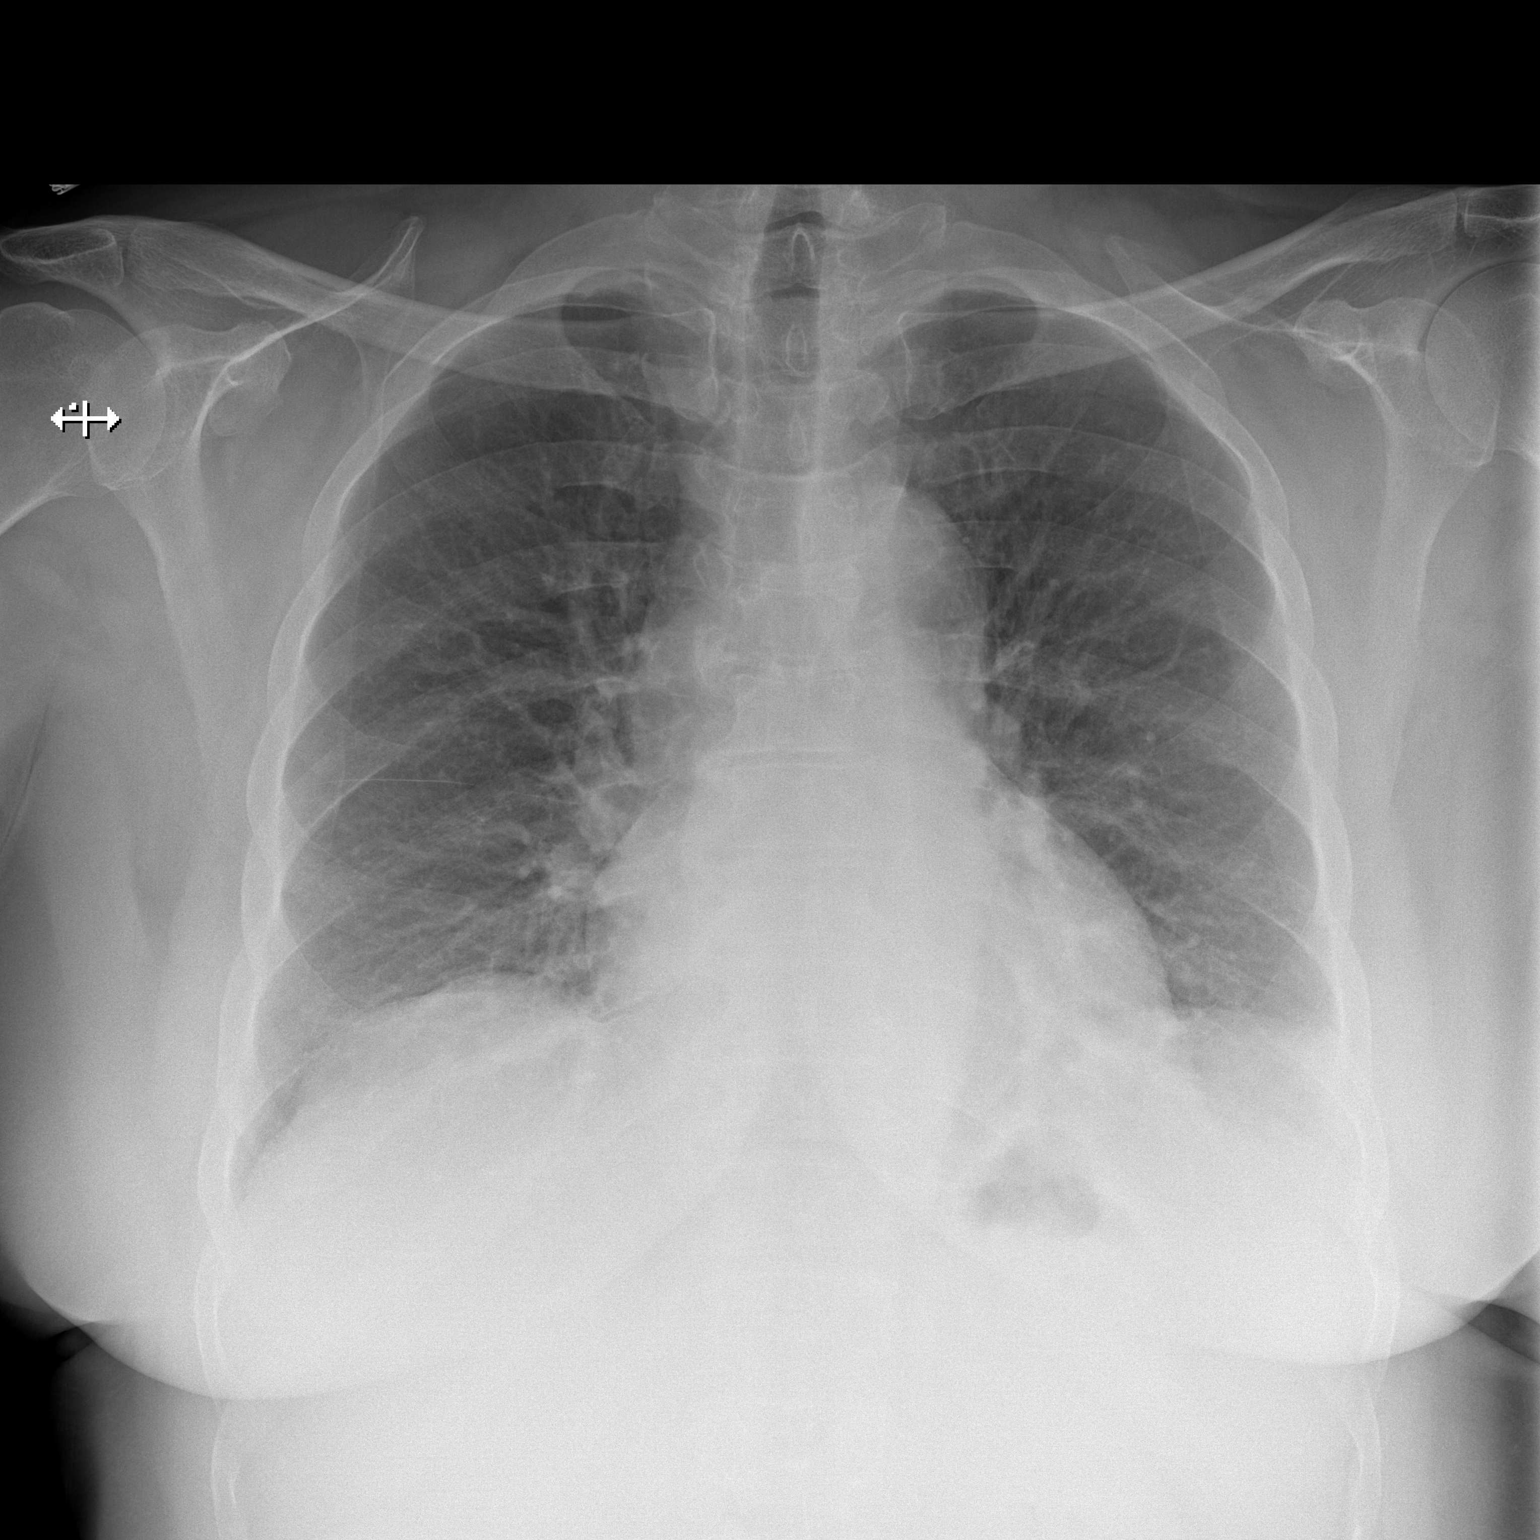

[w chest lat]
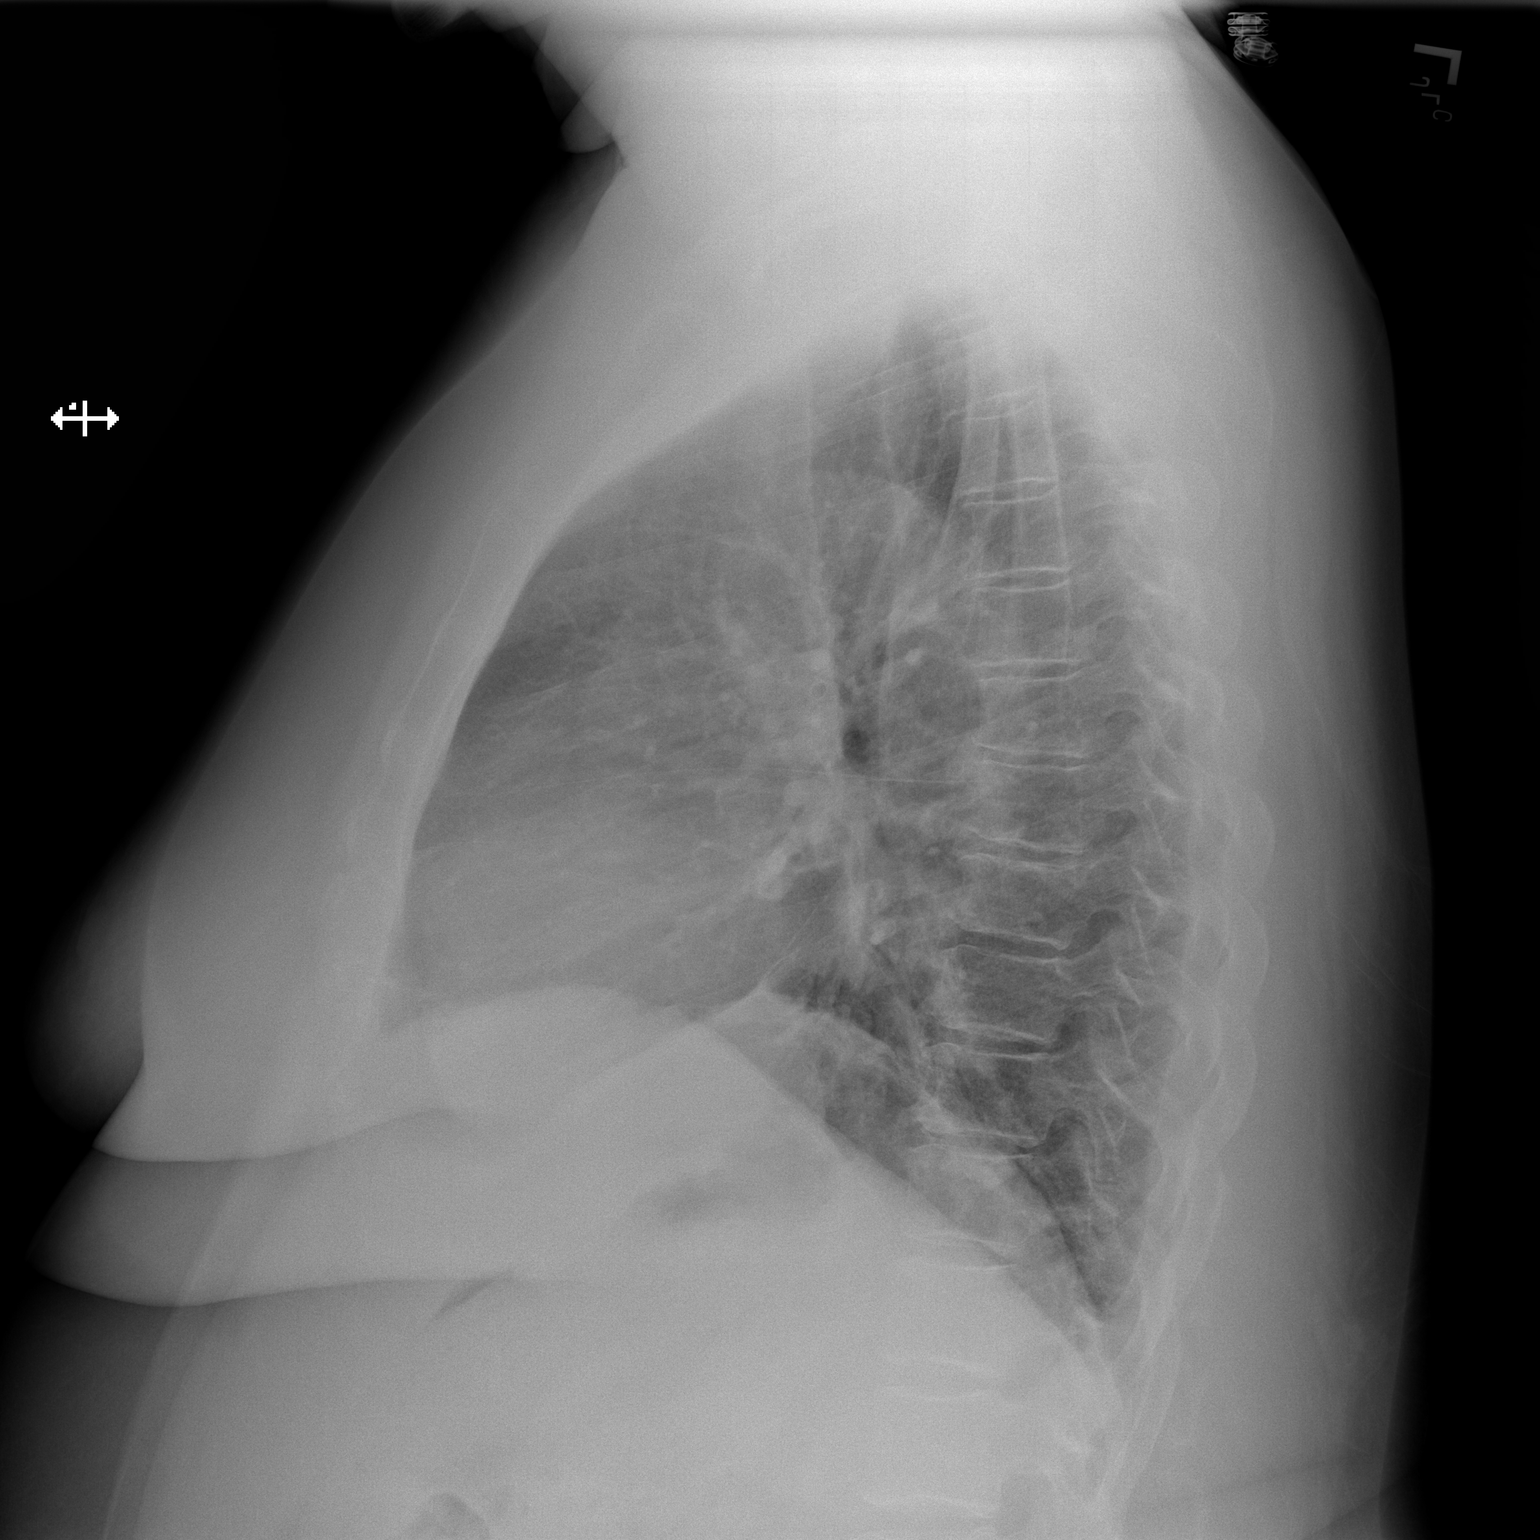

[2 of 2 positions shown; findings below may reference images not displayed]

FINDINGS: Heart size is normal. The aorta is tortuous. Slight worsening of
patchy infiltrate/atelectasis in both lower lobes. Upper lungs
remain clear. No effusion.
IMPRESSION: Slight worsening of patchy infiltrate/atelectasis in both lower
lobes.

## 2016-07-09 MED ORDER — AZITHROMYCIN 250 MG PO TABS: 250.0000 mg | ORAL_TABLET | Freq: Every day | ORAL | 0 refills | Status: AC

## 2016-07-09 MED ORDER — IBUPROFEN 400 MG PO TABS: 400.0000 mg | ORAL_TABLET | Freq: Four times a day (QID) | ORAL | Status: DC | PRN

## 2016-07-09 MED ORDER — BENZONATATE 100 MG PO CAPS
100.0000 mg | ORAL_CAPSULE | Freq: Three times a day (TID) | ORAL | Status: DC | PRN
  Administered 2016-07-09: 100 mg via ORAL
  Filled 2016-07-09: qty 1

## 2016-07-09 MED ORDER — DICLOFENAC SODIUM 1 % TD GEL
2.0000 g | Freq: Four times a day (QID) | TRANSDERMAL | Status: DC
  Administered 2016-07-09: 2 g via TOPICAL
  Filled 2016-07-09: qty 100

## 2016-07-09 MED ORDER — AZITHROMYCIN 500 MG PO TABS
250.0000 mg | ORAL_TABLET | Freq: Every day | ORAL | Status: DC
  Administered 2016-07-09: 250 mg via ORAL
  Filled 2016-07-09: qty 1

## 2016-07-09 NOTE — Progress Notes (Signed)
Megan Lane to be D/C'd Home per MD order.  Discussed with the patient and all questions fully answered.  VSS, Skin clean, dry and intact without evidence of skin break down, no evidence of skin tears noted. IV catheter discontinued intact. Site without signs and symptoms of complications. Dressing and pressure applied.  An After Visit Summary was printed and given to the patient. Patient received prescription.  D/c education completed with patient/family including follow up instructions, medication list, d/c activities limitations if indicated, with other d/c instructions as indicated by MD - patient able to verbalize understanding, all questions fully answered.   Patient instructed to return to ED, call 911, or call MD for any changes in condition.   Patient escorted via [place], and D/C home via private auto.  [place] 07/09/2016 4:08 PM

## 2016-07-09 NOTE — Progress Notes (Signed)
   Subjective: Ms. Brodzinski was found sitting up in bed during rounds. She says her weakness has improved, she was able to get to the bathroom without assistance and has been eating. She reports that the pain in her upper chest with breathing has returned and she complains of a cough.   Objective:  Vital signs in last 24 hours: Vitals:   07/08/16 1021 07/08/16 1529 07/08/16 2208 07/09/16 0458  BP: 116/76 117/75 118/68 (!) 141/81  Pulse: 96 94 77 80  Resp:  (!) 21 18 18   Temp:  98.4 F (36.9 C) 99.4 F (37.4 C) 99.5 F (37.5 C)  TempSrc:  Oral Oral Oral  SpO2: 97% 97% 94% 96%   Physical Exam  Constitutional: She is oriented to person, place, and time and well-developed, well-nourished, and in no distress. No distress.  HENT:  Head: Normocephalic and atraumatic.  Eyes: Conjunctivae are normal. Right eye exhibits no discharge. Left eye exhibits no discharge. No scleral icterus.  Cardiovascular: Normal rate, regular rhythm and normal heart sounds.   No peripheral edema   Pulmonary/Chest: Effort normal and breath sounds normal. No respiratory distress. She has no wheezes. She has no rales.  Abdominal: Soft. Bowel sounds are normal. She exhibits no distension. There is no tenderness. There is no guarding.  Neurological: She is alert and oriented to person, place, and time. No cranial nerve deficit.  Skin: Skin is warm and dry. She is not diaphoretic.   Assessment/Plan:  Principal Problem:   Weakness Active Problems:   Essential hypertension, benign   Diabetes mellitus due to underlying condition without complications (HCC)   CAP (community acquired pneumonia)   Chest pain   Headache   HLD (hyperlipidemia)  Weakness  Weakness has resolved, she is ambulating and eating on her own. Blood cultures were drawn in the ED prior to administration of levofloxicin for ?CAP. Blood cultures resulted with methicillin sensitive non staph aureus staphylococcus in 1 of 4 bottles. Bacteriemia does  not seem likely given her clinical picture, she is afebrile and well appearing. This is most likely a contaminant. We have alerted the patient of this and will follow these cultures results and contact her if the results become concerning.   Bronchitis  Pain with coughing and infiltrate on chest xray in the setting of reported fever and weakness. Procalcitonin 0.28. She is already s/p 1 dose of levofloxacin in the ED. We did consider viral pericarditis, she has no ST changes on EKG, will recheck today. -will order azithromycin 4 day course for discharge (stop date 6/6)  -follow up EKG   Chest pain  Pinching chest pain with breathing had resolved at presentation but is back today. She had negative troponin x3 and EKG without ST abnormalities yesterday. We are repeating an EKG today. She denies recent travel or calf swelling, she is normocardic with no leg swelling or tenderness.  - follow up repeat EKG   Hypertension  Remains normotensive, holdin ghome medication lisinopril- HCTZ, can resume if needed.   HLD  Continue home med atorvastatin   Diabetes  Continue moderate ISS with HS coverage  Continue home med gabapentin   Dispo: Anticipated discharge today  Ledell Noss, MD 07/09/2016, 11:39 AM Pager: [phone]

## 2016-07-09 NOTE — Progress Notes (Signed)
PHARMACY - PHYSICIAN COMMUNICATION CRITICAL VALUE ALERT - BLOOD CULTURE IDENTIFICATION (BCID)  Results for orders placed or performed during the hospital encounter of 07/08/16  Blood Culture ID Panel (Reflexed) (Collected: 07/08/2016  6:20 AM)  Result Value Ref Range   Enterococcus species NOT DETECTED NOT DETECTED   Listeria monocytogenes NOT DETECTED NOT DETECTED   Staphylococcus species DETECTED (A) NOT DETECTED   Staphylococcus aureus NOT DETECTED NOT DETECTED   Methicillin resistance NOT DETECTED NOT DETECTED   Streptococcus species NOT DETECTED NOT DETECTED   Streptococcus agalactiae NOT DETECTED NOT DETECTED   Streptococcus pneumoniae NOT DETECTED NOT DETECTED   Streptococcus pyogenes NOT DETECTED NOT DETECTED   Acinetobacter baumannii NOT DETECTED NOT DETECTED   Enterobacteriaceae species NOT DETECTED NOT DETECTED   Enterobacter cloacae complex NOT DETECTED NOT DETECTED   Escherichia coli NOT DETECTED NOT DETECTED   Klebsiella oxytoca NOT DETECTED NOT DETECTED   Klebsiella pneumoniae NOT DETECTED NOT DETECTED   Proteus species NOT DETECTED NOT DETECTED   Serratia marcescens NOT DETECTED NOT DETECTED   Haemophilus influenzae NOT DETECTED NOT DETECTED   Neisseria meningitidis NOT DETECTED NOT DETECTED   Pseudomonas aeruginosa NOT DETECTED NOT DETECTED   Candida albicans NOT DETECTED NOT DETECTED   Candida glabrata NOT DETECTED NOT DETECTED   Candida krusei NOT DETECTED NOT DETECTED   Candida parapsilosis NOT DETECTED NOT DETECTED   Candida tropicalis NOT DETECTED NOT DETECTED    Name of physician (or Provider) Contacted: Dr Philipp Ovens  Changes to prescribed antibiotics required: Possible contaminant, team will discuss whether clinically appropriate to add ABX; if indicated recommend Ancef 2g IV Q8H.  Wynona Neat, PharmD, BCPS  07/09/2016  7:02 AM

## 2016-07-09 NOTE — Discharge Instructions (Signed)
Megan Lane,  It was a pleasure to work with you in the hospital, I'm glad you are feeling better. We have ruled out the most concerning emergency causes of your chest pain. Please schedule an appointment to see your primary care doctor this week.   For your cough, take this zpack: Azithromycin once daily, stop taking when you've taken all of the pills on 6/6.   If you have any questions or concerns, please call the internal medicine center [phone]

## 2016-07-10 NOTE — Discharge Summary (Signed)
Name: Megan Lane MRN: [id number] DOB: 1955-04-29 61 y.o. PCP: [facility], [facility]  Date of Admission: 07/08/2016  1:30 AM Date of Discharge: 07/10/2016 Attending Physician: No att. providers found  Discharge Diagnosis: 1. Weakness    Acute bronchitis   Discharge Medications: Allergies as of 07/09/2016      Reactions   Penicillins Rash      Medication List    STOP taking these medications   lisinopril 10 MG tablet Commonly known as:  PRINIVIL,ZESTRIL     TAKE these medications   atorvastatin 20 MG tablet Commonly known as:  LIPITOR Take 1 tablet (20 mg total) by mouth daily.   azithromycin 250 MG tablet Commonly known as:  ZITHROMAX Take 1 tablet (250 mg total) by mouth daily.   benzonatate 100 MG capsule Commonly known as:  TESSALON Take 1 capsule (100 mg total) by mouth 3 (three) times daily as needed for cough.   gabapentin 300 MG capsule Commonly known as:  NEURONTIN Take 300 mg by mouth 3 (three) times daily. What changed:  Another medication with the same name was removed. Continue taking this medication, and follow the directions you see here.   lisinopril-hydrochlorothiazide 10-12.5 MG tablet Commonly known as:  PRINZIDE,ZESTORETIC Take 1 tablet by mouth daily.   meloxicam 15 MG tablet Commonly known as:  MOBIC Take 15 mg by mouth daily.   metFORMIN 1000 MG tablet Commonly known as:  GLUCOPHAGE Take 1 tablet (1,000 mg total) by mouth 2 (two) times daily with a meal.   traMADol 50 MG tablet Commonly known as:  ULTRAM Take 1 tablet (50 mg total) by mouth every 8 (eight) hours as needed.   Vitamin D (Ergocalciferol) 50000 units Caps capsule Commonly known as:  DRISDOL Take 1 capsule (50,000 Units total) by mouth every 7 (seven) days.       Disposition and follow-up:   Ms.Megan Lane was discharged from [facility] in Good condition.  At the hospital follow up visit please address:  1.  Bronchitis- have her symptoms  improved after the course of azithromycin?   2.  Labs / imaging needed at time of follow-up: none   3.  Pending labs/ test needing follow-up: blood cultures  Follow-up Appointments: Follow-up Information    [facility], [facility]. Schedule an appointment as soon as possible for a visit in 1 week(s).   Specialty:  Internal Medicine Contact information: [address] [place] [address] [phone]           Hospital Course by problem list:    Weakness   Acute bronchitis Presented with one day of sudden onset nausea, chills, headache, weakness, cough, and pins and needles in her chest with coughing or breathing. She reported a coworker with similar symptoms. At presentation to the ED she was tachycardic with a temp of 100.2 and lactic acid 2.1. Initial chest xray showed possible pulmonary opacity vs atelectasis. In the ED blood cultures were drawn and she got a dose of Levaquin and 2 L fluid bolus. On the day following admission she reported improvement in her symptoms, follow up chest xray showed pulmonary infiltrate. Blood cultures grew non methicillin resistant, non staph aureas staphylococcus species in one of four bottles. Her exam was not consistent with bacteremia and the culture was thought to be a contaminant. She was discharged with a 4 day course of Azithromycin for suspected bronchitis.     Chest pain Reported pinching chest pain with breathing and cough. She denied  a history of DVT or PE or recent travel. She maintain SpO2 100% on room air throughout the admission and had no calf tenderness or swelling. She had negative troponin x2 and no ST abnormalities on EKGx2.     Essential hypertension, benign She remained normotensive this admission, home blood pressure medications were held.     Diabetes mellitus due to underlying condition without complications (HCC) Glucose was controlled with moderate ISS and HS coverage.   Discharge Vitals:   BP (!) 141/81 (BP Location:  Left Arm)   Pulse 80   Temp 99.5 F (37.5 C) (Oral)   Resp 18   SpO2 96%   Pertinent Labs, Studies, and Procedures:  6/3 chest xray  Heart size is normal. The aorta is tortuous. Slight worsening of patchy infiltrate/atelectasis in both lower lobes. Upper lungs remain clear. No effusion.  Discharge Instructions: Discharge Instructions    Call MD for:  persistant nausea and vomiting    Complete by:  As directed    Call MD for:  severe uncontrolled pain    Complete by:  As directed    Call MD for:  temperature >100.4    Complete by:  As directed    Diet - low sodium heart healthy    Complete by:  As directed    Increase activity slowly    Complete by:  As directed       Signed: Ledell Noss, MD 07/11/2016, 9:03 PM   Pager: [phone]

## 2016-07-11 LAB — CULTURE, BLOOD (ROUTINE X 2): Special Requests: ADEQUATE

## 2016-07-13 LAB — CULTURE, BLOOD (ROUTINE X 2)
Culture: NO GROWTH
SPECIAL REQUESTS: ADEQUATE

## 2016-12-04 ENCOUNTER — Ambulatory Visit: Payer: BLUE CROSS/BLUE SHIELD | Attending: Family Medicine | Admitting: Family Medicine

## 2016-12-04 ENCOUNTER — Encounter: Payer: Self-pay | Admitting: Family Medicine

## 2016-12-04 VITALS — BP 168/93 | HR 76 | Temp 98.4°F | Resp 18 | Ht 71.0 in | Wt 236.4 lb

## 2016-12-04 DIAGNOSIS — I10 Essential (primary) hypertension: Secondary | ICD-10-CM | POA: Diagnosis not present

## 2016-12-04 DIAGNOSIS — Z79899 Other long term (current) drug therapy: Secondary | ICD-10-CM | POA: Insufficient documentation

## 2016-12-04 DIAGNOSIS — E785 Hyperlipidemia, unspecified: Secondary | ICD-10-CM | POA: Insufficient documentation

## 2016-12-04 DIAGNOSIS — R102 Pelvic and perineal pain: Secondary | ICD-10-CM

## 2016-12-04 DIAGNOSIS — E119 Type 2 diabetes mellitus without complications: Secondary | ICD-10-CM | POA: Diagnosis not present

## 2016-12-04 DIAGNOSIS — E089 Diabetes mellitus due to underlying condition without complications: Secondary | ICD-10-CM

## 2016-12-04 DIAGNOSIS — L84 Corns and callosities: Secondary | ICD-10-CM | POA: Insufficient documentation

## 2016-12-04 DIAGNOSIS — Z7984 Long term (current) use of oral hypoglycemic drugs: Secondary | ICD-10-CM | POA: Insufficient documentation

## 2016-12-04 DIAGNOSIS — Z23 Encounter for immunization: Secondary | ICD-10-CM | POA: Insufficient documentation

## 2016-12-04 LAB — POCT URINALYSIS DIPSTICK
Blood, UA: NEGATIVE
GLUCOSE UA: NEGATIVE
Ketones, UA: NEGATIVE
LEUKOCYTES UA: NEGATIVE
NITRITE UA: NEGATIVE
SPEC GRAV UA: 1.025 (ref 1.010–1.025)
UROBILINOGEN UA: 0.2 U/dL
pH, UA: 5.5 (ref 5.0–8.0)

## 2016-12-04 LAB — POCT UA - MICROALBUMIN
Albumin/Creatinine Ratio, Urine, POC: <30
Creatinine, POC: 30 mg/dL
Microalbumin Ur, POC: 30 mg/L

## 2016-12-04 LAB — GLUCOSE, POCT (MANUAL RESULT ENTRY): POC Glucose: 165 mg/dl — AB (ref 70–99)

## 2016-12-04 LAB — POCT GLYCOSYLATED HEMOGLOBIN (HGB A1C): Hemoglobin A1C: 7.3

## 2016-12-04 MED ORDER — HYDROCHLOROTHIAZIDE 25 MG PO TABS: 25.0000 mg | ORAL_TABLET | Freq: Every day | ORAL | 2 refills | Status: DC

## 2016-12-04 MED ORDER — PNEUMOCOCCAL 13-VAL CONJ VACC IM SUSP: 0.5000 mL | Freq: Once | INTRAMUSCULAR | Status: DC

## 2016-12-04 MED ORDER — LISINOPRIL 20 MG PO TABS: 20.0000 mg | ORAL_TABLET | Freq: Every day | ORAL | 2 refills | Status: DC

## 2016-12-04 MED ORDER — IBUPROFEN 600 MG PO TABS: 600.0000 mg | ORAL_TABLET | Freq: Three times a day (TID) | ORAL | 0 refills | Status: DC | PRN

## 2016-12-04 MED ORDER — METFORMIN HCL 1000 MG PO TABS: 1000.0000 mg | ORAL_TABLET | Freq: Two times a day (BID) | ORAL | 3 refills | Status: DC

## 2016-12-04 NOTE — Progress Notes (Signed)
Subjective:  Patient ID: Megan Lane, female    DOB: 1955/05/07  Age: 61 y.o. MRN: [id number]  CC: Diabetes   HPI Megan Lane presents for to establish care. PMH of diabetes, HTN, and dyslipidemia. She denies foot ulcerations, hyperglycemia, nausea, paresthesia of the feet and weight loss.  Evaluation to date has been included: fasting blood sugar, fasting lipid panel and hemoglobin A1C.  Home sugars: BGs are running  consistent with Hgb A1C. Treatment to date: metformin, more attention to diet, excercise. She is exercising and is adherent to low salt diet. She does not check BP at home. Cardiac symptoms none. Patient denies chest pain, claudication, dyspnea, lower extremity edema, near-syncope and palpitations.  Cardiovascular risk factors: diabetes mellitus, dyslipidemia and hypertension. Use of agents associated with hypertension: none. History of target organ damage: none. Pelvic pain: onset 2 weeks ago. Denies any dysuria or hematuria. Pelvic lower pelvic pain occur with coughing or strenuous activity like yard work. She denies any incontinence.    Outpatient Medications Prior to Visit  Medication Sig Dispense Refill  . metFORMIN (GLUCOPHAGE) 1000 MG tablet Take 1 tablet (1,000 mg total) by mouth 2 (two) times daily with a meal. 60 tablet 3  . Vitamin D, Ergocalciferol, (DRISDOL) 50000 UNITS CAPS capsule Take 1 capsule (50,000 Units total) by mouth every 7 (seven) days. 12 capsule 0  . gabapentin (NEURONTIN) 300 MG capsule Take 300 mg by mouth 3 (three) times daily.    . meloxicam (MOBIC) 15 MG tablet Take 15 mg by mouth daily.    Marland Kitchen atorvastatin (LIPITOR) 20 MG tablet Take 1 tablet (20 mg total) by mouth daily. (Patient not taking: Reported on 07/08/2016) 90 tablet 3  . benzonatate (TESSALON) 100 MG capsule Take 1 capsule (100 mg total) by mouth 3 (three) times daily as needed for cough. (Patient not taking: Reported on 08/12/2014) 30 capsule 1  . lisinopril-hydrochlorothiazide  (PRINZIDE,ZESTORETIC) 10-12.5 MG tablet Take 1 tablet by mouth daily.    . traMADol (ULTRAM) 50 MG tablet Take 1 tablet (50 mg total) by mouth every 8 (eight) hours as needed. (Patient not taking: Reported on 07/08/2016) 30 tablet 0   No facility-administered medications prior to visit.     ROS Review of Systems  Constitutional: Negative.   Eyes: Negative.   Respiratory: Negative.   Cardiovascular: Negative.   Gastrointestinal: Negative.   Genitourinary: Positive for pelvic pain.  Skin: Negative.     Objective:  BP (!) 168/93   Pulse 76   Temp 98.4 F (36.9 C) (Oral)   Resp 18   Ht 5\' 11"  (1.803 m)   Wt 236 lb 6.4 oz (107.2 kg)   SpO2 98%   BMI 32.97 kg/m   BP/Weight 12/04/2016 09/09/1515 07/08/6071  Systolic BP 710 626 948  Diastolic BP 93 81 86  Wt. (Lbs) 236.4 - 237.6  BMI 32.97 - 32.22     Physical Exam  Constitutional: She appears well-developed and well-nourished.  Eyes: Pupils are equal, round, and reactive to light. Conjunctivae are normal.  Cardiovascular: Normal rate, regular rhythm, normal heart sounds and intact distal pulses.   Pulmonary/Chest: Effort normal and breath sounds normal.  Abdominal: Soft. Bowel sounds are normal. There is no tenderness.  Genitourinary: Vagina normal.  Skin: Skin is warm and dry.  Psychiatric: She has a normal mood and affect.  Nursing note and vitals reviewed.  Diabetic Foot Exam - Simple   Simple Foot Form Diabetic Foot exam was performed with the following findings:  Yes 12/04/2016  4:13 PM  Visual Inspection No deformities, no ulcerations, no other skin breakdown bilaterally:  Yes See comments:  Yes Sensation Testing Intact to touch and monofilament testing bilaterally:  Yes Pulse Check Posterior Tibialis and Dorsalis pulse intact bilaterally:  Yes Comments Callus to left lateral foot, corns to bilateral fifth digits of  feet.      Assessment & Plan:   1. Type 2 diabetes mellitus without complication, without  long-term current use of insulin (HCC)  - Glucose (CBG) - HgB A1c - metFORMIN (GLUCOPHAGE) 1000 MG tablet; Take 1 tablet (1,000 mg total) by mouth 2 (two) times daily with a meal.  Dispense: 60 tablet; Refill: 3 - Ambulatory referral to Podiatry - POCT UA - Microalbumin  2. Essential hypertension, benign Schedule blood pressure check with clinic RN in 2 weeks. If BP is greater than 90/60 (MAP 65 or greater) but not less than 130/80 may increase dose of lisinopril to 40 mg daily and recheck in another 2 weeks. Follow-up with PCP in 3 months. - lisinopril (PRINIVIL,ZESTRIL) 20 MG tablet; Take 1 tablet (20 mg total) by mouth daily.  Dispense: 30 tablet; Refill: 2 - hydrochlorothiazide (HYDRODIURIL) 25 MG tablet; Take 1 tablet (25 mg total) by mouth daily.  Dispense: 30 tablet; Refill: 2  3. Pelvic pain in female Rule out possibility of vaginocele, none visualized. Referral to gynecology if symptoms persist. - POCT urinalysis dipstick - ibuprofen (ADVIL,MOTRIN) 600 MG tablet; Take 1 tablet (600 mg total) by mouth every 8 (eight) hours as needed.  Dispense: 30 tablet; Refill: 0  4. Dyslipidemia  - Lipid Panel  5. Corns and callosities During foot exam ,per patient request. - Ambulatory referral to Podiatry  6. Needs flu shot  - Flu Vaccine QUAD 6+ mos PF IM (Fluarix Quad PF)  7. Need for vaccination with 13-polyvalent pneumococcal conjugate vaccine  - pneumococcal 13-valent conjugate vaccine (PREVNAR 13) injection 0.5 mL; Inject 0.5 mLs into the muscle once.   Meds ordered this encounter  Medications  . metFORMIN (GLUCOPHAGE) 1000 MG tablet    Sig: Take 1 tablet (1,000 mg total) by mouth 2 (two) times daily with a meal.    Dispense:  60 tablet    Refill:  3    Order Specific Question:   Supervising Provider    Answer:   Tresa Garter [id number]  . pneumococcal 13-valent conjugate vaccine (PREVNAR 13) injection 0.5 mL  . lisinopril (PRINIVIL,ZESTRIL) 20 MG tablet     Sig: Take 1 tablet (20 mg total) by mouth daily.    Dispense:  30 tablet    Refill:  2    Order Specific Question:   Supervising Provider    Answer:   Tresa Garter [id number]  . hydrochlorothiazide (HYDRODIURIL) 25 MG tablet    Sig: Take 1 tablet (25 mg total) by mouth daily.    Dispense:  30 tablet    Refill:  2    Order Specific Question:   Supervising Provider    Answer:   Tresa Garter [id number]  . ibuprofen (ADVIL,MOTRIN) 600 MG tablet    Sig: Take 1 tablet (600 mg total) by mouth every 8 (eight) hours as needed.    Dispense:  30 tablet    Refill:  0    Order Specific Question:   Supervising Provider    Answer:   Tresa Garter [id number]    Follow-up: Return in about 2 weeks (around 12/18/2016), or if symptoms worsen or fail to improve,  for BP check with [facility].   Alfonse Spruce FNP

## 2016-12-04 NOTE — Progress Notes (Signed)
JAPatient complains when cough she feels soreness in her lower stomach pelvic area

## 2016-12-04 NOTE — Patient Instructions (Addendum)
Diabetes Mellitus and Food It is important for you to manage your blood sugar (glucose) level. Your blood glucose level can be greatly affected by what you eat. Eating healthier foods in the appropriate amounts throughout the day at about the same time each day will help you control your blood glucose level. It can also help slow or prevent worsening of your diabetes mellitus. Healthy eating may even help you improve the level of your blood pressure and reach or maintain a healthy weight. General recommendations for healthful eating and cooking habits include:  Eating meals and snacks regularly. Avoid going long periods of time without eating to lose weight.  Eating a diet that consists mainly of plant-based foods, such as fruits, vegetables, nuts, legumes, and whole grains.  Using low-heat cooking methods, such as baking, instead of high-heat cooking methods, such as deep frying.  Work with your dietitian to make sure you understand how to use the Nutrition Facts information on food labels. How can food affect me? Carbohydrates Carbohydrates affect your blood glucose level more than any other type of food. Your dietitian will help you determine how many carbohydrates to eat at each meal and teach you how to count carbohydrates. Counting carbohydrates is important to keep your blood glucose at a healthy level, especially if you are using insulin or taking certain medicines for diabetes mellitus. Alcohol Alcohol can cause sudden decreases in blood glucose (hypoglycemia), especially if you use insulin or take certain medicines for diabetes mellitus. Hypoglycemia can be a life-threatening condition. Symptoms of hypoglycemia (sleepiness, dizziness, and disorientation) are similar to symptoms of having too much alcohol. If your health care provider has given you approval to drink alcohol, do so in moderation and use the following guidelines:  Women should not have more than one drink per day, and men  should not have more than two drinks per day. One drink is equal to: ? 12 oz of beer. ? 5 oz of wine. ? 1 oz of hard liquor.  Do not drink on an empty stomach.  Keep yourself hydrated. Have water, diet soda, or unsweetened iced tea.  Regular soda, juice, and other mixers might contain a lot of carbohydrates and should be counted.  What foods are not recommended? As you make food choices, it is important to remember that all foods are not the same. Some foods have fewer nutrients per serving than other foods, even though they might have the same number of calories or carbohydrates. It is difficult to get your body what it needs when you eat foods with fewer nutrients. Examples of foods that you should avoid that are high in calories and carbohydrates but low in nutrients include:  Trans fats (most processed foods list trans fats on the Nutrition Facts label).  Regular soda.  Juice.  Candy.  Sweets, such as cake, pie, doughnuts, and cookies.  Fried foods.  What foods can I eat? Eat nutrient-rich foods, which will nourish your body and keep you healthy. The food you should eat also will depend on several factors, including:  The calories you need.  The medicines you take.  Your weight.  Your blood glucose level.  Your blood pressure level.  Your cholesterol level.  You should eat a variety of foods, including:  Protein. ? Lean cuts of meat. ? Proteins low in saturated fats, such as fish, egg whites, and beans. Avoid processed meats.  Fruits and vegetables. ? Fruits and vegetables that may help control blood glucose levels, such as apples,   mangoes, and yams.  Dairy products. ? Choose fat-free or low-fat dairy products, such as milk, yogurt, and cheese.  Grains, bread, pasta, and rice. ? Choose whole grain products, such as multigrain bread, whole oats, and brown rice. These foods may help control blood pressure.  Fats. ? Foods containing healthful fats, such as  nuts, avocado, olive oil, canola oil, and fish.  Does everyone with diabetes mellitus have the same meal plan? Because every person with diabetes mellitus is different, there is not one meal plan that works for everyone. It is very important that you meet with a dietitian who will help you create a meal plan that is just right for you. This information is not intended to replace advice given to you by your health care provider. Make sure you discuss any questions you have with your health care provider. Document Released: 10/20/2004 Document Revised: 07/01/2015 Document Reviewed: 12/20/2012 Elsevier Interactive Patient Education  2017 [employer] Diabetes and Foot Care Diabetes may cause you to have problems because of poor blood supply (circulation) to your feet and legs. This may cause the skin on your feet to become thinner, break easier, and heal more slowly. Your skin may become dry, and the skin may peel and crack. You may also have nerve damage in your legs and feet causing decreased feeling in them. You may not notice minor injuries to your feet that could lead to infections or more serious problems. Taking care of your feet is one of the most important things you can do for yourself. Follow these instructions at home:  Wear shoes at all times, even in the house. Do not go barefoot. Bare feet are easily injured.  Check your feet daily for blisters, cuts, and redness. If you cannot see the bottom of your feet, use a mirror or ask someone for help.  Wash your feet with warm water (do not use hot water) and mild soap. Then pat your feet and the areas between your toes until they are completely dry. Do not soak your feet as this can dry your skin.  Apply a moisturizing lotion or petroleum jelly (that does not contain alcohol and is unscented) to the skin on your feet and to dry, brittle toenails. Do not apply lotion between your toes.  Trim your toenails straight across. Do not dig under  them or around the cuticle. File the edges of your nails with an emery board or nail file.  Do not cut corns or calluses or try to remove them with medicine.  Wear clean socks or stockings every day. Make sure they are not too tight. Do not wear knee-high stockings since they may decrease blood flow to your legs.  Wear shoes that fit properly and have enough cushioning. To break in new shoes, wear them for just a few hours a day. This prevents you from injuring your feet. Always look in your shoes before you put them on to be sure there are no objects inside.  Do not cross your legs. This may decrease the blood flow to your feet.  If you find a minor scrape, cut, or break in the skin on your feet, keep it and the skin around it clean and dry. These areas may be cleansed with mild soap and water. Do not cleanse the area with peroxide, alcohol, or iodine.  When you remove an adhesive bandage, be sure not to damage the skin around it.  If you have a wound, look at it several times   a day to make sure it is healing.  Do not use heating pads or hot water bottles. They may burn your skin. If you have lost feeling in your feet or legs, you may not know it is happening until it is too late.  Make sure your health care provider performs a complete foot exam at least annually or more often if you have foot problems. Report any cuts, sores, or bruises to your health care provider immediately. Contact a health care provider if:  You have an injury that is not healing.  You have cuts or breaks in the skin.  You have an ingrown nail.  You notice redness on your legs or feet.  You feel burning or tingling in your legs or feet.  You have pain or cramps in your legs and feet.  Your legs or feet are numb.  Your feet always feel cold. Get help right away if:  There is increasing redness, swelling, or pain in or around a wound.  There is a red line that goes up your leg.  Pus is coming from a  wound.  You develop a fever or as directed by your health care provider.  You notice a bad smell coming from an ulcer or wound. This information is not intended to replace advice given to you by your health care provider. Make sure you discuss any questions you have with your health care provider. Document Released: 01/21/2000 Document Revised: 07/01/2015 Document Reviewed: 07/02/2012 Elsevier Interactive Patient Education  2017 Elsevier Inc.  

## 2016-12-05 ENCOUNTER — Other Ambulatory Visit: Payer: Self-pay | Admitting: Family Medicine

## 2016-12-05 DIAGNOSIS — E782 Mixed hyperlipidemia: Secondary | ICD-10-CM

## 2016-12-05 LAB — LIPID PANEL
CHOL/HDL RATIO: 4.3 ratio (ref 0.0–4.4)
Cholesterol, Total: 216 mg/dL — ABNORMAL HIGH (ref 100–199)
HDL: 50 mg/dL (ref >39)
LDL CALC: 126 mg/dL — AB (ref 0–99)
Triglycerides: 200 mg/dL — ABNORMAL HIGH (ref 0–149)
VLDL CHOLESTEROL CAL: 40 mg/dL (ref 5–40)

## 2016-12-05 MED ORDER — ATORVASTATIN CALCIUM 20 MG PO TABS: 20.0000 mg | ORAL_TABLET | Freq: Every day | ORAL | 2 refills | Status: DC

## 2016-12-06 ENCOUNTER — Telehealth: Payer: Self-pay

## 2016-12-06 NOTE — Telephone Encounter (Signed)
Patient return CMA call   Patient was aware and understood results

## 2016-12-06 NOTE — Telephone Encounter (Signed)
-----   Message from Alfonse Spruce, [employer] sent at 12/05/2016  9:25 PM EDT ----- Lipid levels were elevated. This can increase your risk of heart disease overtime. You will be prescribed atorvastatin. Start eating a diet low in saturated fat. Limit your intake of fried foods, red meats, and whole milk.  Recommend follow up in 3 months.

## 2016-12-06 NOTE — Telephone Encounter (Signed)
CMA call regarding lab results   Patient did not answer but left a VM stating the reason of the call 7 to call me back

## 2017-02-12 ENCOUNTER — Other Ambulatory Visit (HOSPITAL_COMMUNITY)
Admission: RE | Admit: 2017-02-12 | Discharge: 2017-02-12 | Disposition: A | Discharge status: Home / Self Care | Payer: BLUE CROSS/BLUE SHIELD | Source: Ambulatory Visit | Attending: Family Medicine | Admitting: Family Medicine

## 2017-02-12 ENCOUNTER — Other Ambulatory Visit: Payer: Self-pay | Admitting: Family Medicine

## 2017-02-12 ENCOUNTER — Ambulatory Visit: Payer: BLUE CROSS/BLUE SHIELD | Attending: Family Medicine | Admitting: Family Medicine

## 2017-02-12 ENCOUNTER — Encounter: Payer: Self-pay | Admitting: Family Medicine

## 2017-02-12 VITALS — BP 110/74 | HR 75 | Temp 98.7°F | Resp 18 | Ht 72.0 in | Wt 236.0 lb

## 2017-02-12 DIAGNOSIS — Z113 Encounter for screening for infections with a predominantly sexual mode of transmission: Secondary | ICD-10-CM | POA: Insufficient documentation

## 2017-02-12 DIAGNOSIS — M25562 Pain in left knee: Secondary | ICD-10-CM | POA: Diagnosis not present

## 2017-02-12 DIAGNOSIS — F4320 Adjustment disorder, unspecified: Secondary | ICD-10-CM | POA: Insufficient documentation

## 2017-02-12 DIAGNOSIS — E782 Mixed hyperlipidemia: Secondary | ICD-10-CM

## 2017-02-12 DIAGNOSIS — E1140 Type 2 diabetes mellitus with diabetic neuropathy, unspecified: Secondary | ICD-10-CM | POA: Diagnosis not present

## 2017-02-12 DIAGNOSIS — E089 Diabetes mellitus due to underlying condition without complications: Secondary | ICD-10-CM | POA: Diagnosis not present

## 2017-02-12 DIAGNOSIS — I10 Essential (primary) hypertension: Secondary | ICD-10-CM | POA: Insufficient documentation

## 2017-02-12 DIAGNOSIS — B351 Tinea unguium: Secondary | ICD-10-CM

## 2017-02-12 DIAGNOSIS — Z79899 Other long term (current) drug therapy: Secondary | ICD-10-CM | POA: Diagnosis not present

## 2017-02-12 DIAGNOSIS — F4321 Adjustment disorder with depressed mood: Secondary | ICD-10-CM

## 2017-02-12 DIAGNOSIS — Z7984 Long term (current) use of oral hypoglycemic drugs: Secondary | ICD-10-CM | POA: Insufficient documentation

## 2017-02-12 DIAGNOSIS — L602 Onychogryphosis: Secondary | ICD-10-CM

## 2017-02-12 LAB — POCT GLYCOSYLATED HEMOGLOBIN (HGB A1C): Hemoglobin A1C: 7.4

## 2017-02-12 LAB — GLUCOSE, POCT (MANUAL RESULT ENTRY): POC Glucose: 137 mg/dl — AB (ref 70–99)

## 2017-02-12 MED ORDER — GABAPENTIN 300 MG PO CAPS: 300.0000 mg | ORAL_CAPSULE | Freq: Three times a day (TID) | ORAL | 2 refills | Status: DC

## 2017-02-12 MED ORDER — ACCU-CHEK AVIVA PLUS W/DEVICE KIT: 1.0000 | PACK | Freq: Once | 0 refills | Status: AC

## 2017-02-12 MED ORDER — GLUCOSE BLOOD VI STRP: ORAL_STRIP | 12 refills | Status: DC

## 2017-02-12 MED ORDER — SITAGLIPTIN PHOSPHATE 25 MG PO TABS: 25.0000 mg | ORAL_TABLET | Freq: Every day | ORAL | 2 refills | Status: DC

## 2017-02-12 MED ORDER — ACCU-CHEK SOFT TOUCH LANCETS MISC: 12 refills | Status: DC

## 2017-02-12 MED ORDER — ACETAMINOPHEN 500 MG PO TABS: 1000.0000 mg | ORAL_TABLET | Freq: Three times a day (TID) | ORAL | 0 refills | Status: DC | PRN

## 2017-02-12 NOTE — Progress Notes (Signed)
Subjective:  Patient ID: Megan Lane, female    DOB: 1955-11-22  Age: 62 y.o. MRN: [id number]  CC: Follow-up   HPI Megan Lane presents for diabetes and hypertension follow-up. She denies foot ulcerations, hyperglycemia, nausea. She does reports paresthesias feet and  left-sided lower knee pain. Pain is described as intermittent and radiating down left leg pain. Pain rated as severe when it occurs and is worsened by standing. she denies any back pain.  She reports using gabapentin with symptoms evaluation to date has been included: fasting blood sugar, fasting lipid panel and hemoglobin A1C.Home sugars: BGs taken BID and are running  In 130's. Treatment to date: metformin, more attention to diet, excercise. She is exercising and is adherent to low salt diet. She does not check BP at home. Cardiac symptoms none. Patient denies chest pain, claudication, dyspnea, lower extremity edema, near-syncope and palpitations.  Cardiovascular risk factors: diabetes mellitus, dyslipidemia and hypertension. Use of agents associated with hypertension: none. History of target organ damage: none.  She reports recent loss unexpected loss of her fianc.  She declined speaking with the LCSW at this time.  She denies any SI/HI.  She requests STI testing, she reports one sexual partner within the last 3 months.  Outpatient Medications Prior to Visit  Medication Sig Dispense Refill  . atorvastatin (LIPITOR) 20 MG tablet Take 1 tablet (20 mg total) by mouth daily. 30 tablet 2  . hydrochlorothiazide (HYDRODIURIL) 25 MG tablet Take 1 tablet (25 mg total) by mouth daily. 30 tablet 2  . ibuprofen (ADVIL,MOTRIN) 600 MG tablet Take 1 tablet (600 mg total) by mouth every 8 (eight) hours as needed. 30 tablet 0  . meloxicam (MOBIC) 15 MG tablet Take 15 mg by mouth daily.    . metFORMIN (GLUCOPHAGE) 1000 MG tablet Take 1 tablet (1,000 mg total) by mouth 2 (two) times daily with a meal. 60 tablet 3  . gabapentin (NEURONTIN)  300 MG capsule Take 300 mg by mouth 3 (three) times daily.    Marland Kitchen lisinopril (PRINIVIL,ZESTRIL) 20 MG tablet Take 1 tablet (20 mg total) by mouth daily. 30 tablet 2   No facility-administered medications prior to visit.     ROS Review of Systems  Constitutional: Negative.   Eyes: Negative.   Respiratory: Negative.   Cardiovascular: Negative.   Gastrointestinal: Negative.   Musculoskeletal: Positive for myalgias.  Skin: Negative.   Neurological:       Parathesias-feet  Psychiatric/Behavioral: Positive for dysphoric mood (grief). Negative for suicidal ideas.    Objective:  BP 110/74 (BP Location: Left Arm, Patient Position: Sitting, Cuff Size: Large)   Pulse 75   Temp 98.7 F (37.1 C) (Oral)   Resp 18   Ht 6' (1.829 m)   Wt 236 lb (107 kg)   SpO2 99%   BMI 32.01 kg/m   BP/Weight 02/12/2017 65/46/5035 05/13/5679  Systolic BP 275 170 017  Diastolic BP 74 93 81  Wt. (Lbs) 236 236.4 -  BMI 32.01 32.97 -     Physical Exam  Constitutional: She appears well-developed and well-nourished.  Eyes: Conjunctivae are normal. Pupils are equal, round, and reactive to light.  Cardiovascular: Normal rate, regular rhythm, normal heart sounds and intact distal pulses.  Pulmonary/Chest: Effort normal and breath sounds normal.  Abdominal: Soft. Bowel sounds are normal. There is no tenderness.  Genitourinary: Vagina normal.  Musculoskeletal: Normal range of motion. She exhibits no tenderness.  Skin: Skin is warm and dry.  Elongated, thickened discolored toenails bilaterally.  Psychiatric: She is not withdrawn. She exhibits a depressed mood. She expresses no homicidal and no suicidal ideation. She expresses no suicidal plans and no homicidal plans. She is communicative. She is attentive.  Nursing note and vitals reviewed.   Assessment & Plan:   1. Type 2 diabetes mellitus with diabetic neuropathy, without long-term current use of insulin (HCC)  - Glucose (CBG) - Hemoglobin A1c -  gabapentin (NEURONTIN) 300 MG capsule; Take 1 capsule (300 mg total) by mouth 3 (three) times daily.  Dispense: 90 capsule; Refill: 2 - Ambulatory referral to Ophthalmology  2. Essential hypertension, benign  - CMP and Liver  3. Screening for STDs (sexually transmitted diseases)  - HEP, RPR, HIV Panel - HSV(herpes simplex vrs) 1+2 ab-IgG - Urine cytology ancillary only  4. Grief reaction Patient reports good support system.  She declines any resources or talking with the LCSW at this time.  5. Mixed hyperlipidemia  - CMP and Liver - Lipid Panel  6. Onychomycosis  - Ambulatory referral to Podiatry  7. Overgrown toenails  - Ambulatory referral to Podiatry  8. Arthralgia of left knee  - acetaminophen (TYLENOL) 500 MG tablet; Take 2 tablets (1,000 mg total) by mouth every 8 (eight) hours as needed for moderate pain.  Dispense: 30 tablet; Refill: 0     Follow-up: Return in about 3 months (around 05/13/2017) for HTN/DM.   Alfonse Spruce FNP

## 2017-02-12 NOTE — Patient Instructions (Addendum)
Go to pharmacy to receive pneumonia 23 vaccination.   Type 2 Diabetes Mellitus, Self Care, Adult When you have type 2 diabetes (type 2 diabetes mellitus), you must keep your blood sugar (glucose) under control. You can do this with:  Nutrition.  Exercise.  Lifestyle changes.  Medicines or insulin, if needed.  Support from your doctors and others.  How do I manage my blood sugar?  Check your blood sugar level every day, as often as told.  Call your doctor if your blood sugar is above your goal numbers for 2 tests in a row.  Have your A1c (hemoglobin A1c) level checked at least two times a year. Have it checked more often if your doctor tells you to. Your doctor will set treatment goals for you. Generally, you should have these blood sugar levels:  Before meals (preprandial): 80-130 mg/dL (4.4-7.2 mmol/L).  After meals (postprandial): lower than 180 mg/dL (10 mmol/L).  A1c level: less than 7%.  What do I need to know about high blood sugar? High blood sugar is called hyperglycemia. Know the signs of high blood sugar. Signs may include:  Feeling: ? Thirsty. ? Hungry. ? Very tired.  Needing to pee (urinate) more than usual.  Blurry vision.  What do I need to know about low blood sugar? Low blood sugar is called hypoglycemia. This is when blood sugar is at or below 70 mg/dL (3.9 mmol/L). Symptoms may include:  Feeling: ? Hungry. ? Worried or nervous (anxious). ? Sweaty and clammy. ? Confused. ? Dizzy. ? Sleepy. ? Sick to your stomach (nauseous).  Having: ? A fast heartbeat (palpitations). ? A headache. ? A change in your vision. ? Jerky movements that you cannot control (seizure). ? Nightmares. ? Tingling or no feeling (numbness) around the mouth, lips, or tongue.  Having trouble with: ? Talking. ? Paying attention (concentrating). ? Moving (coordination). ? Sleeping.  Shaking.  Passing out (fainting).  Getting upset easily  (irritability).  Treating low blood sugar  To treat low blood sugar, eat or drink something sugary right away. If you can think clearly and swallow safely, follow the 15:15 rule:  Take 15 grams of a fast-acting carb (carbohydrate). Some fast-acting carbs are: ? 1 tube of glucose gel. ? 3 sugar tablets (glucose pills). ? 6-8 pieces of hard candy. ? 4 oz (120 mL) of fruit juice. ? 4 oz (120 mL) regular (not diet) soda.  Check your blood sugar 15 minutes after you take the carb.  If your blood sugar is still at or below 70 mg/dL (3.9 mmol/L), take 15 grams of a carb again.  If your blood sugar does not go above 70 mg/dL (3.9 mmol/L) after 3 tries, get help right away.  After your blood sugar goes back to normal, eat a meal or a snack within 1 hour.  Treating very low blood sugar If your blood sugar is at or below 54 mg/dL (3 mmol/L), you have very low blood sugar (severe hypoglycemia). This is an emergency. Do not wait to see if the symptoms will go away. Get medical help right away. Call your local emergency services (911 in the [place] Do not drive yourself to the hospital. If you have very low blood sugar and you cannot eat or drink, you may need a glucagon shot (injection). A family member or friend should learn how to check your blood sugar and how to give you a glucagon shot. Ask your doctor if you need to have a glucagon shot kit at  home. What else is important to manage my diabetes? Medicine Follow these instructions about insulin and diabetes medicines:  Take them as told by your doctor.  Adjust them as told by your doctor.  Do not run out of them.  Having diabetes can raise your risk for other long-term conditions. These include heart or kidney disease. Your doctor may prescribe medicines to help prevent problems from diabetes. Food   Make healthy food choices. These include: ? Chicken, fish, egg whites, and beans. ? Oats, whole wheat, bulgur, brown rice, quinoa, and  millet. ? Fresh fruits and vegetables. ? Low-fat dairy products. ? Nuts, avocado, olive oil, and canola oil.  Make a food plan with a specialist (dietitian).  Follow instructions from your doctor about what you cannot eat or drink.  Drink enough fluid to keep your pee (urine) clear or pale yellow.  Eat healthy snacks between healthy meals.  Keep track of carbs that you eat. Read food labels. Learn food serving sizes.  Follow your sick day plan when you cannot eat or drink normally. Make this plan with your doctor so it is ready to use. Activity  Exercise at least 3 times a week.  Do not go more than 2 days without exercising.  Talk with your doctor before you start a new exercise. Your doctor may need to adjust your insulin, medicines, or food. Lifestyle   Do not use any tobacco products. These include cigarettes, chewing tobacco, and e-cigarettes.If you need help quitting, ask your doctor.  Ask your doctor how much alcohol is safe for you.  Learn to deal with stress. If you need help with this, ask your doctor. Body care  Stay up to date with your shots (immunizations).  Have your eyes and feet checked by a doctor as often as told.  Check your skin and feet every day. Check for cuts, bruises, redness, blisters, or sores.  Brush your teeth and gums two times a day.  Floss at least one time a day.  Go to the dentist least one time every 6 months.  Stay at a healthy weight. General instructions   Take over-the-counter and prescription medicines only as told by your doctor.  Share your diabetes care plan with: ? Your work or school. ? People you live with.  Check your pee (urine) for ketones: ? When you are sick. ? As told by your doctor.  Carry a card or wear jewelry that says that you have diabetes.  Ask your doctor: ? Do I need to meet with a diabetes educator? ? Where can I find a support group for people with diabetes?  Keep all follow-up visits as  told by your doctor. This is important. Where to find more information: To learn more about diabetes, visit:  American Diabetes Association: www.diabetes.org  American Association of Diabetes Educators: www.diabeteseducator.org/patient-resources  This information is not intended to replace advice given to you by your health care provider. Make sure you discuss any questions you have with your health care provider. Document Released: 05/17/2015 Document Revised: 07/01/2015 Document Reviewed: 02/26/2015 Elsevier Interactive Patient Education  [employer].

## 2017-02-13 LAB — LIPID PANEL
CHOLESTEROL TOTAL: 198 mg/dL (ref 100–199)
Chol/HDL Ratio: 3.7 ratio (ref 0.0–4.4)
HDL: 53 mg/dL (ref >39)
LDL Calculated: 104 mg/dL — ABNORMAL HIGH (ref 0–99)
TRIGLYCERIDES: 203 mg/dL — AB (ref 0–149)
VLDL CHOLESTEROL CAL: 41 mg/dL — AB (ref 5–40)

## 2017-02-13 LAB — HEP, RPR, HIV PANEL
HEP B S AG: NEGATIVE
HIV SCREEN 4TH GENERATION: NONREACTIVE
RPR: NONREACTIVE

## 2017-02-13 LAB — CMP AND LIVER
ALK PHOS: 110 IU/L (ref 39–117)
ALT: 28 IU/L (ref 0–32)
AST: 19 IU/L (ref 0–40)
Albumin: 4.4 g/dL (ref 3.6–4.8)
BILIRUBIN TOTAL: 0.3 mg/dL (ref 0.0–1.2)
BILIRUBIN, DIRECT: 0.09 mg/dL (ref 0.00–0.40)
BUN: 12 mg/dL (ref 8–27)
CALCIUM: 9.7 mg/dL (ref 8.7–10.3)
CHLORIDE: 105 mmol/L (ref 96–106)
CO2: 21 mmol/L (ref 20–29)
Creatinine, Ser: 0.85 mg/dL (ref 0.57–1.00)
GFR calc non Af Amer: 75 mL/min/{1.73_m2} (ref >59)
GFR, EST AFRICAN AMERICAN: 86 mL/min/{1.73_m2} (ref >59)
Glucose: 122 mg/dL — ABNORMAL HIGH (ref 65–99)
Potassium: 4.2 mmol/L (ref 3.5–5.2)
Sodium: 143 mmol/L (ref 134–144)
Total Protein: 7.3 g/dL (ref 6.0–8.5)

## 2017-02-13 LAB — HSV(HERPES SIMPLEX VRS) I + II AB-IGG
HSV 1 GLYCOPROTEIN G AB, IGG: 32.8 {index} — AB (ref 0.00–0.90)
HSV 2 IgG, Type Spec: 8.01 index — ABNORMAL HIGH (ref 0.00–0.90)

## 2017-02-14 ENCOUNTER — Other Ambulatory Visit: Payer: Self-pay | Admitting: Family Medicine

## 2017-02-14 DIAGNOSIS — E782 Mixed hyperlipidemia: Secondary | ICD-10-CM

## 2017-02-14 LAB — URINE CYTOLOGY ANCILLARY ONLY
Chlamydia: NEGATIVE
Neisseria Gonorrhea: NEGATIVE
TRICH (WINDOWPATH): NEGATIVE

## 2017-02-14 MED ORDER — ATORVASTATIN CALCIUM 20 MG PO TABS: 20.0000 mg | ORAL_TABLET | Freq: Every day | ORAL | 2 refills | Status: DC

## 2017-02-16 LAB — URINE CYTOLOGY ANCILLARY ONLY: Candida vaginitis: NEGATIVE

## 2017-02-19 ENCOUNTER — Other Ambulatory Visit: Payer: Self-pay | Admitting: Family Medicine

## 2017-02-19 DIAGNOSIS — N760 Acute vaginitis: Principal | ICD-10-CM

## 2017-02-19 DIAGNOSIS — B9689 Other specified bacterial agents as the cause of diseases classified elsewhere: Secondary | ICD-10-CM

## 2017-02-19 MED ORDER — METRONIDAZOLE 500 MG PO TABS: 500.0000 mg | ORAL_TABLET | Freq: Two times a day (BID) | ORAL | 0 refills | Status: DC

## 2017-03-05 ENCOUNTER — Telehealth (INDEPENDENT_AMBULATORY_CARE_PROVIDER_SITE_OTHER): Payer: Self-pay | Admitting: *Deleted

## 2017-03-05 NOTE — Telephone Encounter (Signed)
-----   Message from Alfonse Spruce, [place] sent at 02/14/2017  2:44 PM EST ----- HIV, Hepatitis B, and syphilis are all negative. Gonorrhea, Chlamydia,, and Trichomonas were all negative. Herpes type 1 which is primarily responsible for cold sores is positive. When you have cold sores do not kiss anyone, share utensils, or have oral sex. 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 This indicates you have been exposed to the herpes virus in the past. Herpes cannot be cured. To decrease the risk of spreading of herpes use a condom every time you have sex, do not have sex when you have symptoms of an outbreak (blisters, sores, tingling). Medications are available to help manage outbreaks. Liver function normal Kidney function normal Lipid levels are elevated. This can increase your risk of heart disease overtime. Dosage of atorvastatin will be increased. Eat a diet lower in saturated fat. Recheck in 3 months.

## 2017-03-05 NOTE — Telephone Encounter (Signed)
Patient verified DOB Patient is aware of HIV, Hep B, syphilis, GC/CL, Trich being negative. 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 Patient is also aware of liver and kidney being normal. Cholesterol level is elevated and atorvastatin was increased, a recheck will be completed in 3 months. Patient is also aware of BV being positive and needing to complete the flagyl course. No further questions at this time.

## 2017-03-21 ENCOUNTER — Other Ambulatory Visit: Payer: Self-pay | Admitting: Internal Medicine

## 2017-03-21 ENCOUNTER — Other Ambulatory Visit: Payer: Self-pay | Admitting: Family Medicine

## 2017-03-21 DIAGNOSIS — Z1231 Encounter for screening mammogram for malignant neoplasm of breast: Secondary | ICD-10-CM

## 2017-04-06 ENCOUNTER — Ambulatory Visit (HOSPITAL_COMMUNITY)
Admission: EM | Admit: 2017-04-06 | Discharge: 2017-04-06 | Disposition: A | Discharge status: Home / Self Care | Payer: BLUE CROSS/BLUE SHIELD | Attending: Internal Medicine | Admitting: Internal Medicine

## 2017-04-06 ENCOUNTER — Other Ambulatory Visit: Payer: Self-pay

## 2017-04-06 ENCOUNTER — Encounter (HOSPITAL_COMMUNITY): Payer: Self-pay | Admitting: Emergency Medicine

## 2017-04-06 DIAGNOSIS — H1031 Unspecified acute conjunctivitis, right eye: Secondary | ICD-10-CM

## 2017-04-06 MED ORDER — OFLOXACIN 0.3 % OP SOLN: OPHTHALMIC | 0 refills | Status: DC

## 2017-04-06 MED ORDER — POLYETHYL GLYCOL-PROPYL GLYCOL 0.4-0.3 % OP GEL: 1.0000 "application " | Freq: Every evening | OPHTHALMIC | 0 refills | Status: DC | PRN

## 2017-04-06 NOTE — Discharge Instructions (Signed)
Use ofloxacin eyedrops as directed on right eye. Artificial tear gel at night. Wait 10-15 minutes between drops, always use artificial tear gel last, as it prevents drops from penetrating through. Lid scrubs and warm compresses as directed. Monitor for any worsening of symptoms, changes in vision, sensitivity to light, eye swelling, follow up with ophthalmology for further evaluation.

## 2017-04-06 NOTE — ED Triage Notes (Signed)
Pt c/o drainaged, redness from her R eye x1 week.

## 2017-04-06 NOTE — ED Provider Notes (Signed)
[facility]    CSN: [id number] Arrival date & time: 04/06/17  1643     History   Chief Complaint Chief Complaint  Patient presents with  . Conjunctivitis    HPI Megan Lane is a 62 y.o. female.   62 year old female comes in for 1 week history of right eye redness and drainage. Eye crusted shut in the morning. Eye itching without pain. Denies photophobia, vision changes. Denies injury/trauma. Has stopped using make up around the eye. Denies contact lens use. Does have glasses, but has not been using as it broke. Does have eye appointment coming up. Denies URI symptoms such as cough, congestion, sore throat. Visine without relief.       Past Medical History:  Diagnosis Date  . Diabetes mellitus   . Hypertension     Patient Active Problem List   Diagnosis Date Noted  . Acute bronchitis 07/09/2016  . CAP (community acquired pneumonia) 07/08/2016  . Chest pain 07/08/2016  . Headache 07/08/2016  . Weakness 07/08/2016  . HLD (hyperlipidemia) 07/08/2016  . Diabetes mellitus due to underlying condition without complications ([place]) 62/95/2841  . Back muscle spasm 04/30/2013  . Nasal congestion 04/30/2013  . Essential hypertension, benign 04/30/2013  . Pap smear for cervical cancer screening 03/20/2013    Past Surgical History:  Procedure Laterality Date  . ABDOMINAL HYSTERECTOMY    . KNEE SURGERY      OB History    No data available       Home Medications    Prior to Admission medications   Medication Sig Start Date End Date Taking? Authorizing Provider  atorvastatin (LIPITOR) 20 MG tablet Take 1 tablet (20 mg total) by mouth daily. 02/14/17  Yes Hairston, Maylon Peppers, FNP  gabapentin (NEURONTIN) 300 MG capsule Take 1 capsule (300 mg total) by mouth 3 (three) times daily. 02/12/17  Yes Fredia Beets R, FNP  glucose blood test strip Use as instructed 02/12/17  Yes Hairston, Mandesia R, FNP  hydrochlorothiazide (HYDRODIURIL) 25 MG tablet Take 1 tablet  (25 mg total) by mouth daily. 12/04/16  Yes Alfonse Spruce, FNP  Lancets (ACCU-CHEK SOFT TOUCH) lancets Use as instructed 02/12/17  Yes Hairston, Mandesia R, FNP  meloxicam (MOBIC) 15 MG tablet Take 15 mg by mouth daily. 06/01/16  Yes [provider]  metFORMIN (GLUCOPHAGE) 1000 MG tablet Take 1 tablet (1,000 mg total) by mouth 2 (two) times daily with a meal. 12/04/16  Yes Hairston, Mandesia R, FNP  sitaGLIPtin (JANUVIA) 25 MG tablet Take 1 tablet (25 mg total) by mouth daily. 02/12/17  Yes Hairston, Maylon Peppers, FNP  acetaminophen (TYLENOL) 500 MG tablet Take 2 tablets (1,000 mg total) by mouth every 8 (eight) hours as needed for moderate pain. 02/12/17   Alfonse Spruce, FNP  ibuprofen (ADVIL,MOTRIN) 600 MG tablet Take 1 tablet (600 mg total) by mouth every 8 (eight) hours as needed. 12/04/16   Alfonse Spruce, FNP  metroNIDAZOLE (FLAGYL) 500 MG tablet Take 1 tablet (500 mg total) by mouth 2 (two) times daily. Patient not taking: Reported on 04/06/2017 02/19/17   Alfonse Spruce, FNP  ofloxacin (OCUFLOX) 0.3 % ophthalmic solution 1 drop in right eye every 4 hours for the next 2 days, then 4 times a day for the next 5 days. 04/06/17   Tasia Catchings, Amy V, PA-C  Polyethyl Glycol-Propyl Glycol (SYSTANE) 0.4-0.3 % GEL ophthalmic gel Place 1 application into both eyes at bedtime as needed. 04/06/17   Ok Edwards,  PA-C    Family History Family History  Problem Relation Age of Onset  . Diabetes Mother   . Hypertension Mother   . Stroke Mother   . Heart disease Brother     Social History Social History   Tobacco Use  . Smoking status: Former Smoker    Last attempt to quit: 08/11/2005    Years since quitting: 11.6  . Smokeless tobacco: Never Used  Substance Use Topics  . Alcohol use: No  . Drug use: No     Allergies   Penicillins   Review of Systems Review of Systems  Reason unable to perform ROS: See HPI as above.     Physical Exam Triage Vital Signs ED Triage Vitals  [04/06/17 1649]  Enc Vitals Group     BP (!) 158/90     Pulse Rate 83     Resp 18     Temp 98.1 F (36.7 C)     Temp src      SpO2 100 %     Weight      Height      Head Circumference      Peak Flow      Pain Score 0     Pain Loc      Pain Edu?      Excl. in [place]?    No data found.  Updated Vital Signs BP (!) 158/90   Pulse 83   Temp 98.1 F (36.7 C)   Resp 18   SpO2 100%   Visual Acuity Right Eye Distance:   20/100 ([place]) Left Eye Distance:   20/100 ([place]) Bilateral Distance:    Right Eye Near:   Left Eye Near:    Bilateral Near:     Physical Exam  Constitutional: She is oriented to person, place, and time. She appears well-developed and well-nourished. No distress.  HENT:  Head: Normocephalic and atraumatic.  Eyes: EOM are normal. Pupils are equal, round, and reactive to light. Right conjunctiva is injected.  Mild swelling of the right upper lids.  No tenderness on palpation.  No erythema, increased warmth.  No hordeolum felt. No ciliary injection.  Fluorescein stain without uptake.  Neurological: She is alert and oriented to person, place, and time.    UC Treatments / Results  Labs (all labs ordered are listed, but only abnormal results are displayed) Labs Reviewed - No data to display  EKG  EKG Interpretation None       Radiology No results found.  Procedures Procedures (including critical care time)  Medications Ordered in UC Medications - No data to display   Initial Impression / Assessment and Plan / UC Course  I have reviewed the triage vital signs and the nursing notes.  Pertinent labs & imaging results that were available during my care of the patient were reviewed by me and considered in my medical decision making (see chart for details).    Start ofloxacin drops as directed. Artificial tears gel as directed. Lid scrubs and warm compresses as directed. Patient to follow up with ophthalmology if symptoms worsens or does not improve.  Return precautions given.    Final Clinical Impressions(s) / UC Diagnoses   Final diagnoses:  Acute conjunctivitis of right eye, unspecified acute conjunctivitis type    ED Discharge Orders        Ordered    ofloxacin (OCUFLOX) 0.3 % ophthalmic solution     04/06/17 1739    Polyethyl Glycol-Propyl Glycol (SYSTANE) 0.4-0.3 % GEL ophthalmic  gel  At bedtime PRN     04/06/17 1739        Ok Edwards, PA-C 04/06/17 1745

## 2017-04-06 NOTE — ED Notes (Signed)
Visual acuity done by Cathlean Sauer, PA

## 2017-04-15 ENCOUNTER — Emergency Department (HOSPITAL_COMMUNITY)
Admission: EM | Admit: 2017-04-15 | Discharge: 2017-04-15 | Disposition: A | Discharge status: Home / Self Care | Payer: BLUE CROSS/BLUE SHIELD | Attending: Emergency Medicine | Admitting: Emergency Medicine

## 2017-04-15 ENCOUNTER — Encounter (HOSPITAL_COMMUNITY): Payer: Self-pay | Admitting: Emergency Medicine

## 2017-04-15 DIAGNOSIS — S0501XA Injury of conjunctiva and corneal abrasion without foreign body, right eye, initial encounter: Secondary | ICD-10-CM | POA: Diagnosis not present

## 2017-04-15 DIAGNOSIS — Z87891 Personal history of nicotine dependence: Secondary | ICD-10-CM | POA: Diagnosis not present

## 2017-04-15 DIAGNOSIS — H5789 Other specified disorders of eye and adnexa: Secondary | ICD-10-CM | POA: Diagnosis not present

## 2017-04-15 DIAGNOSIS — Y929 Unspecified place or not applicable: Secondary | ICD-10-CM | POA: Insufficient documentation

## 2017-04-15 DIAGNOSIS — X58XXXA Exposure to other specified factors, initial encounter: Secondary | ICD-10-CM | POA: Insufficient documentation

## 2017-04-15 DIAGNOSIS — I10 Essential (primary) hypertension: Secondary | ICD-10-CM | POA: Insufficient documentation

## 2017-04-15 DIAGNOSIS — E119 Type 2 diabetes mellitus without complications: Secondary | ICD-10-CM | POA: Diagnosis not present

## 2017-04-15 DIAGNOSIS — Y998 Other external cause status: Secondary | ICD-10-CM | POA: Diagnosis not present

## 2017-04-15 DIAGNOSIS — Z79899 Other long term (current) drug therapy: Secondary | ICD-10-CM | POA: Insufficient documentation

## 2017-04-15 DIAGNOSIS — Z7984 Long term (current) use of oral hypoglycemic drugs: Secondary | ICD-10-CM | POA: Insufficient documentation

## 2017-04-15 DIAGNOSIS — Y9389 Activity, other specified: Secondary | ICD-10-CM | POA: Insufficient documentation

## 2017-04-15 DIAGNOSIS — S0591XA Unspecified injury of right eye and orbit, initial encounter: Secondary | ICD-10-CM | POA: Diagnosis not present

## 2017-04-15 MED ORDER — TETRACAINE HCL 0.5 % OP SOLN
1.0000 [drp] | Freq: Once | OPHTHALMIC | Status: AC
  Administered 2017-04-15: 1 [drp] via OPHTHALMIC
  Filled 2017-04-15: qty 4

## 2017-04-15 MED ORDER — HYPROMELLOSE (GONIOSCOPIC) 2.5 % OP SOLN: 1.0000 [drp] | Freq: Three times a day (TID) | OPHTHALMIC | 12 refills | Status: DC | PRN

## 2017-04-15 MED ORDER — FLUORESCEIN SODIUM 1 MG OP STRP
1.0000 | ORAL_STRIP | Freq: Once | OPHTHALMIC | Status: AC
  Administered 2017-04-15: 1 via OPHTHALMIC
  Filled 2017-04-15: qty 1

## 2017-04-15 MED ORDER — CIPROFLOXACIN HCL 0.3 % OP SOLN: 1.0000 [drp] | OPHTHALMIC | 0 refills | Status: DC

## 2017-04-15 NOTE — ED Provider Notes (Signed)
[facility] EMERGENCY DEPARTMENT Provider Note   CSN: [id number] Arrival date & time: 04/15/17  1116     History   Chief Complaint Chief Complaint  Patient presents with  . Eye Problem    HPI Megan Lane is a 62 y.o. female past medical history of diabetes and hypertension presenting with right eye drainage and irritation.  She explains that this started on March 1 and she went to urgent care where she was prescribed artificial tears and ofloxacin which initially seemed to clear the first 2 days but then she reports worsening thereafter.  No contact lens wear.  Denies pain with extraocular motion, fever, chills, visual changes.  Reports a foreign body sensation under her eyelid.  No headaches, dizziness, nausea/vomiting.  HPI  Past Medical History:  Diagnosis Date  . Diabetes mellitus   . Hypertension     Patient Active Problem List   Diagnosis Date Noted  . Acute bronchitis 07/09/2016  . CAP (community acquired pneumonia) 07/08/2016  . Chest pain 07/08/2016  . Headache 07/08/2016  . Weakness 07/08/2016  . HLD (hyperlipidemia) 07/08/2016  . Diabetes mellitus due to underlying condition without complications ([place]) 35/00/9381  . Back muscle spasm 04/30/2013  . Nasal congestion 04/30/2013  . Essential hypertension, benign 04/30/2013  . Pap smear for cervical cancer screening 03/20/2013    Past Surgical History:  Procedure Laterality Date  . ABDOMINAL HYSTERECTOMY    . KNEE SURGERY      OB History    No data available       Home Medications    Prior to Admission medications   Medication Sig Start Date End Date Taking? Authorizing Provider  acetaminophen (TYLENOL) 500 MG tablet Take 2 tablets (1,000 mg total) by mouth every 8 (eight) hours as needed for moderate pain. 02/12/17   Alfonse Spruce, FNP  atorvastatin (LIPITOR) 20 MG tablet Take 1 tablet (20 mg total) by mouth daily. 02/14/17   Alfonse Spruce, FNP  ciprofloxacin (CILOXAN)  0.3 % ophthalmic solution Place 1 drop into the right eye every 4 (four) hours while awake. Administer 1 drop, every 2 hours, while awake, for 2 days. Then 1 drop, every 4 hours, while awake, for the next 5 days. 04/15/17   Avie Echevaria B, PA-C  gabapentin (NEURONTIN) 300 MG capsule Take 1 capsule (300 mg total) by mouth 3 (three) times daily. 02/12/17   Fredia Beets R, FNP  glucose blood test strip Use as instructed 02/12/17   Alfonse Spruce, FNP  hydrochlorothiazide (HYDRODIURIL) 25 MG tablet Take 1 tablet (25 mg total) by mouth daily. 12/04/16   Alfonse Spruce, FNP  hydroxypropyl methylcellulose / hypromellose (ISOPTO TEARS / GONIOVISC) 2.5 % ophthalmic solution Place 1 drop into the right eye 3 (three) times daily as needed for dry eyes. 04/15/17   Emeline General, PA-C  ibuprofen (ADVIL,MOTRIN) 600 MG tablet Take 1 tablet (600 mg total) by mouth every 8 (eight) hours as needed. 12/04/16   Alfonse Spruce, FNP  Lancets (ACCU-CHEK SOFT TOUCH) lancets Use as instructed 02/12/17   Alfonse Spruce, FNP  meloxicam (MOBIC) 15 MG tablet Take 15 mg by mouth daily. 06/01/16   [provider]  metFORMIN (GLUCOPHAGE) 1000 MG tablet Take 1 tablet (1,000 mg total) by mouth 2 (two) times daily with a meal. 12/04/16   Hairston, Maylon Peppers, FNP  metroNIDAZOLE (FLAGYL) 500 MG tablet Take 1 tablet (500 mg total) by mouth 2 (two) times daily. Patient not taking:  Reported on 04/06/2017 02/19/17   Alfonse Spruce, FNP  ofloxacin (OCUFLOX) 0.3 % ophthalmic solution 1 drop in right eye every 4 hours for the next 2 days, then 4 times a day for the next 5 days. 04/06/17   Tasia Catchings, Amy V, PA-C  Polyethyl Glycol-Propyl Glycol (SYSTANE) 0.4-0.3 % GEL ophthalmic gel Place 1 application into both eyes at bedtime as needed. 04/06/17   Tasia Catchings, Amy V, PA-C  sitaGLIPtin (JANUVIA) 25 MG tablet Take 1 tablet (25 mg total) by mouth daily. 02/12/17   Alfonse Spruce, FNP    Family History Family History    Problem Relation Age of Onset  . Diabetes Mother   . Hypertension Mother   . Stroke Mother   . Heart disease Brother     Social History Social History   Tobacco Use  . Smoking status: Former Smoker    Last attempt to quit: 08/11/2005    Years since quitting: 11.6  . Smokeless tobacco: Never Used  Substance Use Topics  . Alcohol use: No  . Drug use: No     Allergies   Penicillins   Review of Systems Review of Systems  Constitutional: Negative for chills, diaphoresis, fatigue and fever.  Eyes: Positive for discharge, redness and itching. Negative for photophobia and visual disturbance.  Respiratory: Negative for cough, chest tightness and shortness of breath.   Cardiovascular: Negative for chest pain and leg swelling.  Gastrointestinal: Negative for nausea and vomiting.  Musculoskeletal: Negative for neck pain and neck stiffness.  Skin: Negative for color change, pallor and rash.  Neurological: Negative for dizziness, weakness, light-headedness, numbness and headaches.  Psychiatric/Behavioral: Negative for behavioral problems.     Physical Exam Updated Vital Signs BP (!) 157/91   Pulse 79   Temp 98.7 F (37.1 C)   Resp 18   SpO2 100%   Physical Exam  Constitutional: She appears well-developed and well-nourished. No distress.  Afebrile, nontoxic-appearing, sitting comfortably in chair in no acute distress.  HENT:  Head: Normocephalic and atraumatic.  Eyes: EOM are normal. Pupils are equal, round, and reactive to light. Right eye exhibits discharge. Left eye exhibits no discharge. No scleral icterus.  Mild upper lid edema warmth or erythema. No pain with EOM. watery discharge. Lids, lashes, lacrimals without lesions. Conjunctiva and sclera injected on the right. Cornea with fluorescein uptake at 7 o'clock. Anterior chamber is deep and normal appearing. Irises are round and reactive. Lenses are clear. IOP RT 15 with 95 % confidence.      Visual Acuity  Right Eye  Distance: 20/80 Left Eye Distance: 20/50-  Bilateral Distance: 20/40 ,Pt wears glasses but did not bring glasses  Neck: Normal range of motion. Neck supple.  Cardiovascular: Normal rate, regular rhythm and normal heart sounds.  No murmur heard. Pulmonary/Chest: Effort normal and breath sounds normal. No respiratory distress.  Musculoskeletal: Normal range of motion. She exhibits no edema.  Neurological: She is alert.  Skin: Skin is warm and dry. No rash noted. She is not diaphoretic. No erythema. No pallor.  Psychiatric: She has a normal mood and affect.  Nursing note and vitals reviewed.    ED Treatments / Results  Labs (all labs ordered are listed, but only abnormal results are displayed) Labs Reviewed - No data to display  EKG  EKG Interpretation None       Radiology No results found.  Procedures Procedures (including critical care time)  Medications Ordered in ED Medications  tetracaine (PONTOCAINE) 0.5 % ophthalmic solution 1  drop (1 drop Right Eye Given 04/15/17 1429)  fluorescein ophthalmic strip 1 strip (1 strip Right Eye Given 04/15/17 1429)     Initial Impression / Assessment and Plan / ED Course  I have reviewed the triage vital signs and the nursing notes.  Pertinent labs & imaging results that were available during my care of the patient were reviewed by me and considered in my medical decision making (see chart for details).     Corneal abrasion  Pt with corneal abrasion on PE. Tdap given. Eye irrigated w NS, no evidence of FB.  No change in vision. Pt is not a contact lens wearer.  Exam non-concerning for orbital cellulitis, hyphema, corneal ulcers. Patient will be discharged home with cipro ophthalmic.  Patient understands to follow up with ophthalmology, & to return to ER if new symptoms develop including change in vision, purulent drainage, or entrapment.  Discharge home with close follow-up with ophthalmology.  Discussed strict return precautions  and advised to return to the emergency department if experiencing any new or worsening symptoms. Instructions were understood and patient agreed with discharge plan.  Final Clinical Impressions(s) / ED Diagnoses   Final diagnoses:  Abrasion of right cornea, initial encounter    ED Discharge Orders        Ordered    hydroxypropyl methylcellulose / hypromellose (ISOPTO TEARS / GONIOVISC) 2.5 % ophthalmic solution  3 times daily PRN     04/15/17 1616    ciprofloxacin (CILOXAN) 0.3 % ophthalmic solution  Every 4 hours while awake     04/15/17 1616       Dossie Der 04/15/17 1623    Fredia Sorrow, MD 04/15/17 1646

## 2017-04-15 NOTE — ED Triage Notes (Signed)
Pt seen at urgent care March 1st with diagnosis of pink eye. Was using eye drops and thought they were making her eye worse so she stopped them. Pt has swelling to right eye with yellow drainage, with redness to sclera.

## 2017-04-15 NOTE — Discharge Instructions (Signed)
As discussed, apply eyedrops every 4 hours while awake and follow-up with Dr. Nancy Fetter ophthalmology (eye specialist) tomorrow by calling her office in the morning.  You may refrigerate your artificial tears to help with discomfort.  Cool wet washcloth compress.  Return if symptoms worsen, visual changes, bad headache, dizziness, or swelling around your eye or any other new concerning symptoms in the meantime.

## 2017-04-15 NOTE — ED Notes (Signed)
Declined W/C at D/C and was escorted to lobby by RN. 

## 2017-04-15 NOTE — ED Triage Notes (Signed)
PT reports using Clear eyes .

## 2017-04-19 ENCOUNTER — Ambulatory Visit
Admission: RE | Admit: 2017-04-19 | Discharge: 2017-04-19 | Disposition: A | Discharge status: Home / Self Care | Payer: BLUE CROSS/BLUE SHIELD | Source: Ambulatory Visit | Attending: Family Medicine | Admitting: Family Medicine

## 2017-04-19 DIAGNOSIS — Z1231 Encounter for screening mammogram for malignant neoplasm of breast: Secondary | ICD-10-CM

## 2017-04-19 IMAGING — MG DIGITAL SCREENING BILATERAL MAMMOGRAM WITH TOMO AND CAD
3 series · 3 of 3 positions shown · non-contrast
Comparison: Previous exam(s).

CLINICAL DATA: Screening.

EXAM:
DIGITAL SCREENING BILATERAL MAMMOGRAM WITH TOMO AND CAD

[R CC synth-2D]
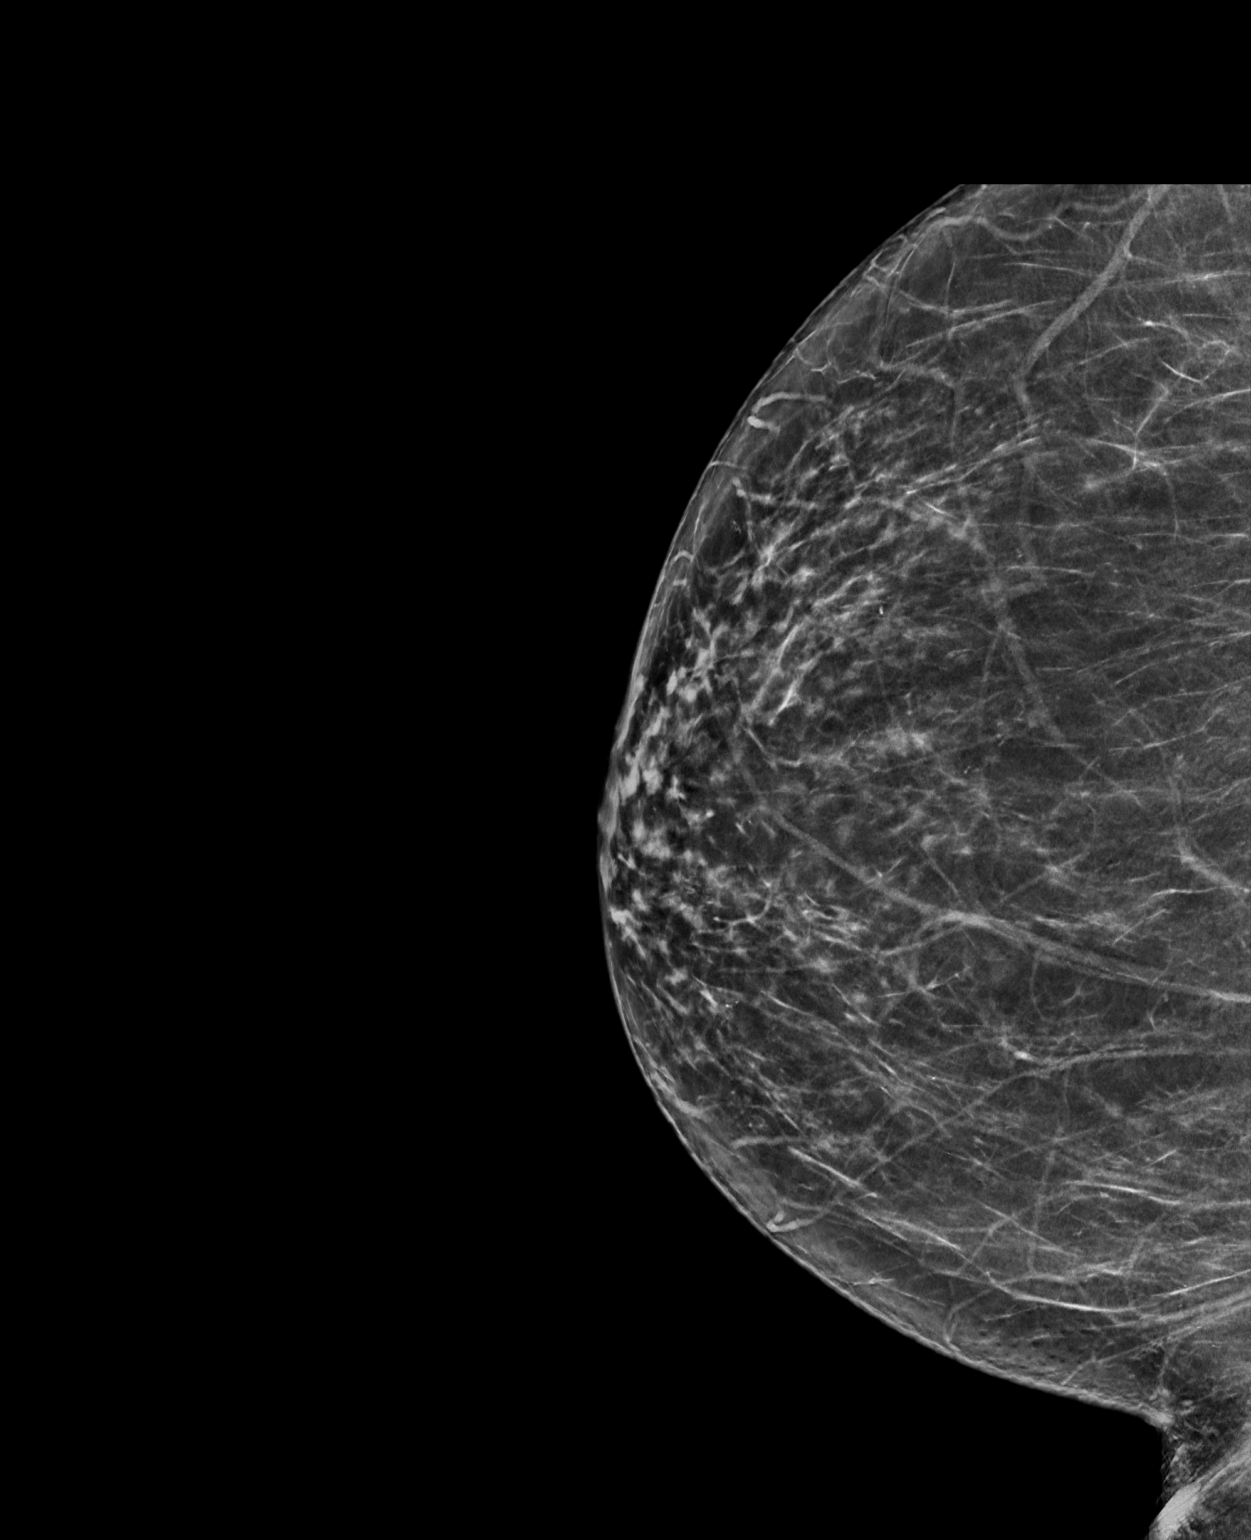

[L CC synth-2D]
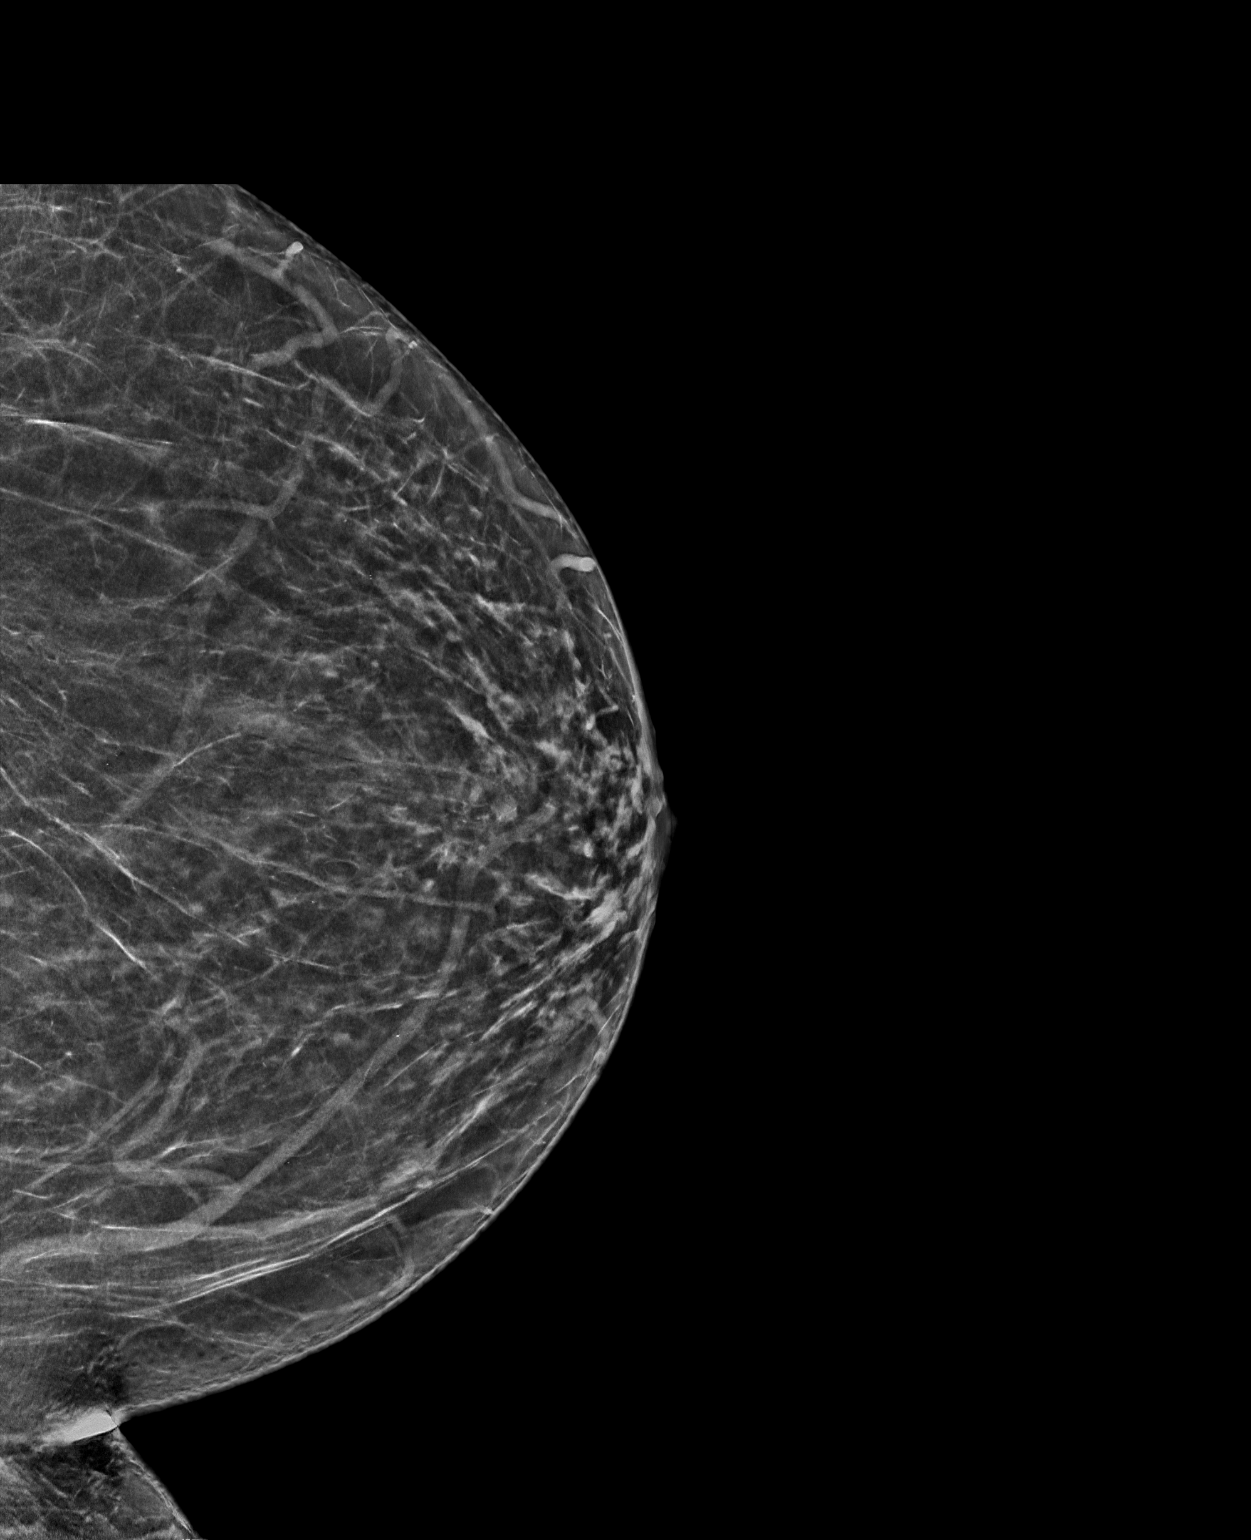

[R MLO synth-2D]
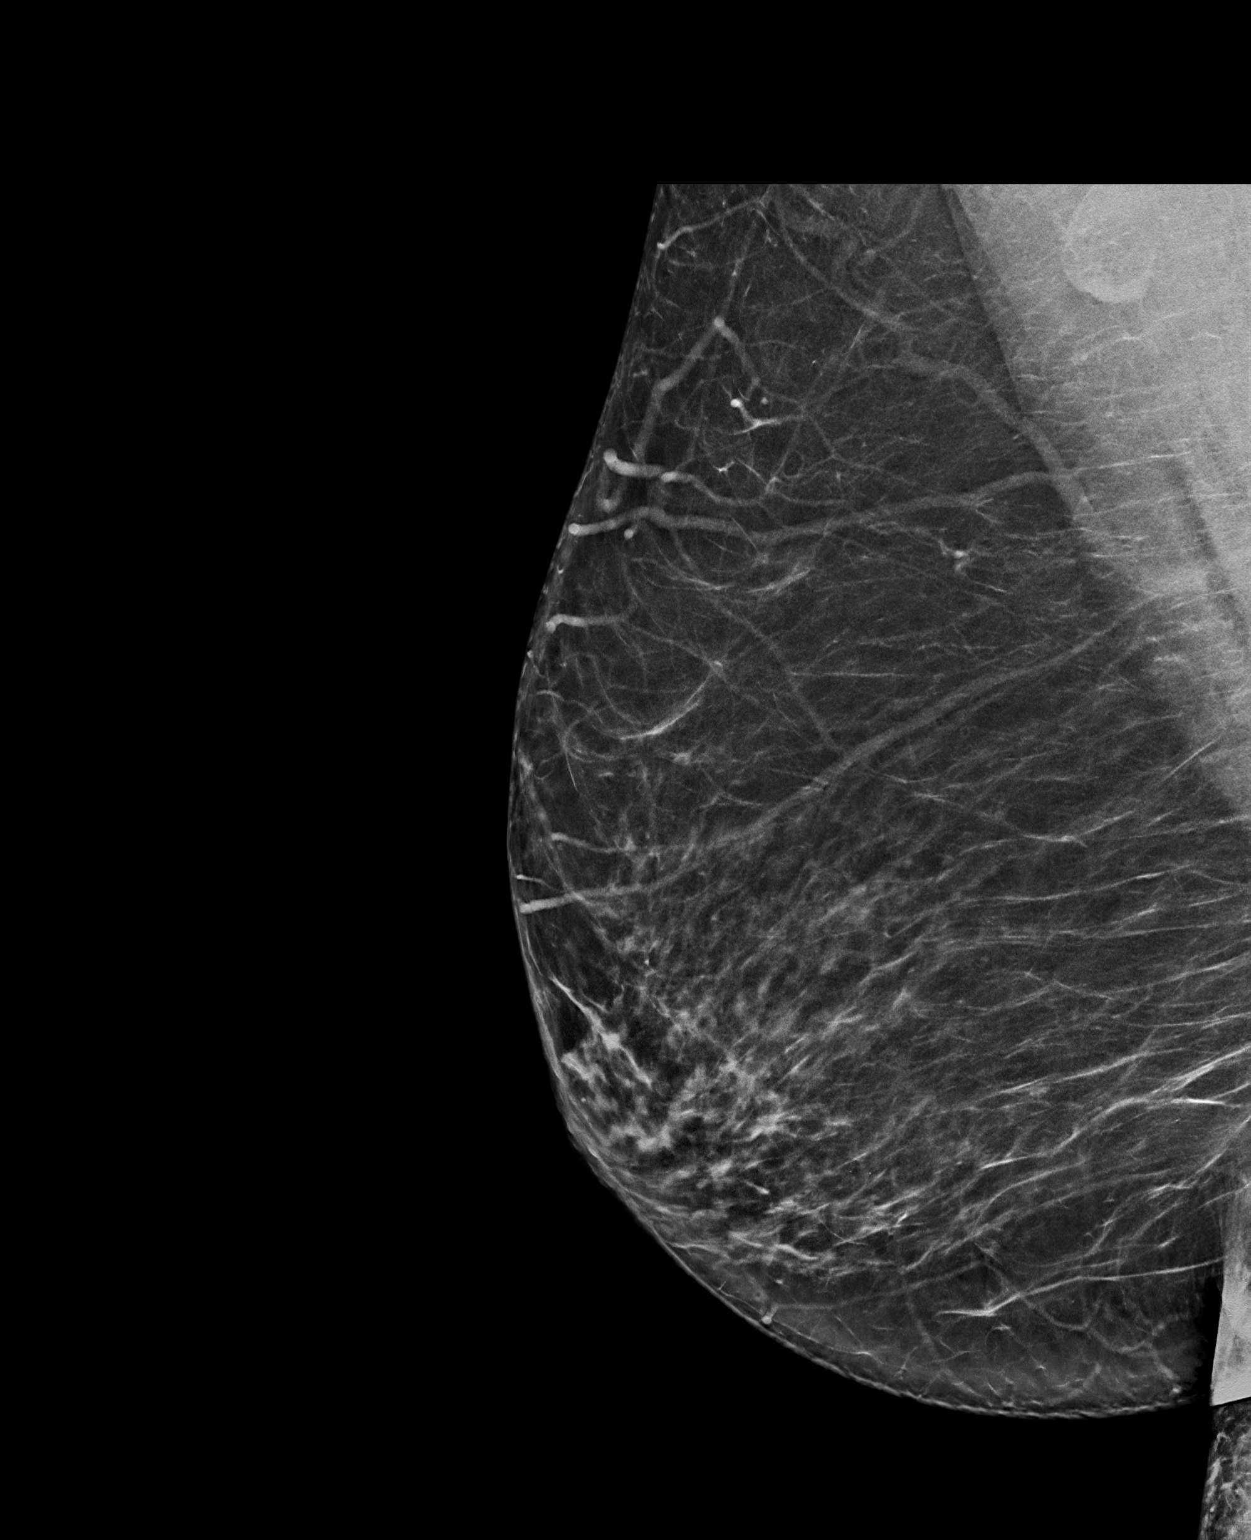

[3 of 3 positions shown; findings below may reference images not displayed]

ACR Breast Density Category b: There are scattered areas of
fibroglandular density.
FINDINGS: There are no findings suspicious for malignancy. Images were
processed with CAD.
IMPRESSION: No mammographic evidence of malignancy. A result letter of this
screening mammogram will be mailed directly to the patient.

RECOMMENDATION:
Screening mammogram in one year. (Code:[TQ])

BI-RADS CATEGORY  1: Negative.

## 2017-04-26 LAB — HM DIABETES EYE EXAM

## 2017-04-30 ENCOUNTER — Other Ambulatory Visit: Payer: Self-pay | Admitting: Pharmacist

## 2017-04-30 DIAGNOSIS — E1140 Type 2 diabetes mellitus with diabetic neuropathy, unspecified: Secondary | ICD-10-CM

## 2017-04-30 MED ORDER — SITAGLIPTIN PHOSPHATE 25 MG PO TABS: 25.0000 mg | ORAL_TABLET | Freq: Every day | ORAL | 0 refills | Status: DC

## 2017-05-02 ENCOUNTER — Ambulatory Visit: Payer: Self-pay | Admitting: Podiatry

## 2017-05-03 ENCOUNTER — Telehealth: Payer: Self-pay | Admitting: Family Medicine

## 2017-05-03 NOTE — Telephone Encounter (Signed)
1 page, paperwork received through fax 05-03-17. °

## 2017-05-15 ENCOUNTER — Ambulatory Visit: Payer: BLUE CROSS/BLUE SHIELD | Admitting: Family Medicine

## 2017-05-15 ENCOUNTER — Encounter: Payer: Self-pay | Admitting: Family Medicine

## 2017-05-15 ENCOUNTER — Ambulatory Visit: Payer: BLUE CROSS/BLUE SHIELD | Attending: Family Medicine | Admitting: Family Medicine

## 2017-05-15 VITALS — BP 156/95 | HR 94 | Temp 98.6°F | Ht 72.0 in | Wt 241.2 lb

## 2017-05-15 DIAGNOSIS — Z7984 Long term (current) use of oral hypoglycemic drugs: Secondary | ICD-10-CM | POA: Diagnosis not present

## 2017-05-15 DIAGNOSIS — Z880 Allergy status to penicillin: Secondary | ICD-10-CM | POA: Diagnosis not present

## 2017-05-15 DIAGNOSIS — J302 Other seasonal allergic rhinitis: Secondary | ICD-10-CM | POA: Diagnosis not present

## 2017-05-15 DIAGNOSIS — Z79899 Other long term (current) drug therapy: Secondary | ICD-10-CM | POA: Diagnosis not present

## 2017-05-15 DIAGNOSIS — R0982 Postnasal drip: Secondary | ICD-10-CM | POA: Diagnosis not present

## 2017-05-15 DIAGNOSIS — E1140 Type 2 diabetes mellitus with diabetic neuropathy, unspecified: Secondary | ICD-10-CM

## 2017-05-15 DIAGNOSIS — E782 Mixed hyperlipidemia: Secondary | ICD-10-CM

## 2017-05-15 DIAGNOSIS — Z791 Long term (current) use of non-steroidal anti-inflammatories (NSAID): Secondary | ICD-10-CM | POA: Insufficient documentation

## 2017-05-15 DIAGNOSIS — I10 Essential (primary) hypertension: Secondary | ICD-10-CM | POA: Diagnosis not present

## 2017-05-15 LAB — GLUCOSE, POCT (MANUAL RESULT ENTRY): POC Glucose: 127 mg/dl — AB (ref 70–99)

## 2017-05-15 LAB — POCT GLYCOSYLATED HEMOGLOBIN (HGB A1C): HEMOGLOBIN A1C: 6.8

## 2017-05-15 MED ORDER — METFORMIN HCL 1000 MG PO TABS: 1000.0000 mg | ORAL_TABLET | Freq: Two times a day (BID) | ORAL | 3 refills | Status: DC

## 2017-05-15 MED ORDER — CETIRIZINE HCL 10 MG PO TABS
10.0000 mg | ORAL_TABLET | Freq: Every day | ORAL | 1 refills | Status: DC
Start: 2017-05-15 — End: 2017-11-20

## 2017-05-15 MED ORDER — HYDROCHLOROTHIAZIDE 25 MG PO TABS: 25.0000 mg | ORAL_TABLET | Freq: Every day | ORAL | 3 refills | Status: DC

## 2017-05-15 MED ORDER — ATORVASTATIN CALCIUM 20 MG PO TABS: 20.0000 mg | ORAL_TABLET | Freq: Every day | ORAL | 3 refills | Status: DC

## 2017-05-15 MED ORDER — GABAPENTIN 300 MG PO CAPS: 300.0000 mg | ORAL_CAPSULE | Freq: Three times a day (TID) | ORAL | 3 refills | Status: DC

## 2017-05-15 MED ORDER — SITAGLIPTIN PHOSPHATE 25 MG PO TABS: 25.0000 mg | ORAL_TABLET | Freq: Every day | ORAL | 3 refills | Status: DC

## 2017-05-15 NOTE — Progress Notes (Signed)
Subjective:  Patient ID: [facility], female    DOB: 03/31/1955  Age: 62 y.o. MRN: [id number]  CC: Diabetes and Hypertension   HPI LYNNEL ZANETTI is a 62 year old female with Type 2 Diabetes Mellitus (A1c 6.8), Diabetic neuropathy, Hyperlipidemia, Hypertension here to establish care with me. Her A1c is 6.8 which has trended down from 7.4 previously and her neuropathy is controlled on Gabapentin. Tolerates her medications with no complains of adverse effects. Her blood pressure is slightly elevated and she endorses taking her antihypertensive. Doing well on her statin.  She has noticed post nasal drip, , rhinorrhea but no sinus pain, myalgias or fever. She has had some leg cramps at night which improve with the use of mustard; did have an episode where her left leg almost gave out her due to a sharp pain. She is s/p left knee surgery in 2011 after she fractured her patella. Takes Meloxicam. Last month she had an ED visit and a visit to the ophthalmology for R corneal abrasion and eye symptoms have resolved.  Past Medical History:  Diagnosis Date  . Diabetes mellitus   . Hypertension     Past Surgical History:  Procedure Laterality Date  . ABDOMINAL HYSTERECTOMY    . KNEE SURGERY      Family History  Problem Relation Age of Onset  . Diabetes Mother   . Hypertension Mother   . Stroke Mother   . Heart disease Brother   . Breast cancer Sister 65    Allergies  Allergen Reactions  . Penicillins Rash     Outpatient Medications Prior to Visit  Medication Sig Dispense Refill  . acetaminophen (TYLENOL) 500 MG tablet Take 2 tablets (1,000 mg total) by mouth every 8 (eight) hours as needed for moderate pain. 30 tablet 0  . glucose blood test strip Use as instructed 100 each 12  . ibuprofen (ADVIL,MOTRIN) 600 MG tablet Take 1 tablet (600 mg total) by mouth every 8 (eight) hours as needed. 30 tablet 0  . Lancets (ACCU-CHEK SOFT TOUCH) lancets Use as instructed 100 each 12  .  meloxicam (MOBIC) 15 MG tablet Take 15 mg by mouth daily.    Marland Kitchen atorvastatin (LIPITOR) 20 MG tablet Take 1 tablet (20 mg total) by mouth daily. 30 tablet 2  . gabapentin (NEURONTIN) 300 MG capsule Take 1 capsule (300 mg total) by mouth 3 (three) times daily. 90 capsule 2  . hydrochlorothiazide (HYDRODIURIL) 25 MG tablet Take 1 tablet (25 mg total) by mouth daily. 30 tablet 2  . metFORMIN (GLUCOPHAGE) 1000 MG tablet Take 1 tablet (1,000 mg total) by mouth 2 (two) times daily with a meal. 60 tablet 3  . sitaGLIPtin (JANUVIA) 25 MG tablet Take 1 tablet (25 mg total) by mouth daily. 90 tablet 0  . ciprofloxacin (CILOXAN) 0.3 % ophthalmic solution Place 1 drop into the right eye every 4 (four) hours while awake. Administer 1 drop, every 2 hours, while awake, for 2 days. Then 1 drop, every 4 hours, while awake, for the next 5 days. (Patient not taking: Reported on 05/15/2017) 5 mL 0  . hydroxypropyl methylcellulose / hypromellose (ISOPTO TEARS / GONIOVISC) 2.5 % ophthalmic solution Place 1 drop into the right eye 3 (three) times daily as needed for dry eyes. (Patient not taking: Reported on 05/15/2017) 15 mL 12  . metroNIDAZOLE (FLAGYL) 500 MG tablet Take 1 tablet (500 mg total) by mouth 2 (two) times daily. (Patient not taking: Reported on 04/06/2017) 14 tablet  0  . ofloxacin (OCUFLOX) 0.3 % ophthalmic solution 1 drop in right eye every 4 hours for the next 2 days, then 4 times a day for the next 5 days. (Patient not taking: Reported on 05/15/2017) 5 mL 0  . Polyethyl Glycol-Propyl Glycol (SYSTANE) 0.4-0.3 % GEL ophthalmic gel Place 1 application into both eyes at bedtime as needed. (Patient not taking: Reported on 05/15/2017) 1 Bottle 0   No facility-administered medications prior to visit.     ROS Review of Systems  Constitutional: Negative for activity change, appetite change and fatigue.  HENT: Positive for postnasal drip. Negative for congestion, sinus pressure and sore throat.   Eyes: Negative for visual  disturbance.  Respiratory: Negative for cough, chest tightness, shortness of breath and wheezing.   Cardiovascular: Negative for chest pain and palpitations.  Gastrointestinal: Negative for abdominal distention, abdominal pain and constipation.  Endocrine: Negative for polydipsia.  Genitourinary: Negative for dysuria and frequency.  Musculoskeletal:       See  hpi  Skin: Negative for rash.  Neurological: Negative for tremors, light-headedness and numbness.  Hematological: Does not bruise/bleed easily.  Psychiatric/Behavioral: Negative for agitation and behavioral problems.    Objective:  BP (!) 156/95   Pulse 94   Temp 98.6 F (37 C) (Oral)   Ht 6' (1.829 m)   Wt 241 lb 3.2 oz (109.4 kg)   SpO2 98%   BMI 32.71 kg/m   BP/Weight 05/15/2017 7/67/3419 04/13/9022  Systolic BP 097 353 299  Diastolic BP 95 91 90  Wt. (Lbs) 241.2 - -  BMI 32.71 - -      Physical Exam  Constitutional: She is oriented to person, place, and time. She appears well-developed and well-nourished.  Cardiovascular: Normal rate, normal heart sounds and intact distal pulses.  No murmur heard. Pulmonary/Chest: Effort normal and breath sounds normal. She has no wheezes. She has no rales. She exhibits no tenderness.  Abdominal: Soft. Bowel sounds are normal. She exhibits no distension and no mass. There is no tenderness. A hernia (ventral, not tender, reducible) is present.  Musculoskeletal: Normal range of motion.  Neurological: She is alert and oriented to person, place, and time.  Skin: Skin is warm and dry.  Psychiatric: She has a normal mood and affect.     CMP Latest Ref Rng & Units 02/12/2017 07/09/2016 07/08/2016  Glucose 65 - 99 mg/dL 122(H) 137(H) 214(H)  BUN 8 - 27 mg/dL 12 10 15   Creatinine 0.57 - 1.00 mg/dL 0.85 0.80 0.91  Sodium 134 - 144 mmol/L 143 138 139  Potassium 3.5 - 5.2 mmol/L 4.2 3.5 3.8  Chloride 96 - 106 mmol/L 105 110 105  CO2 20 - 29 mmol/L 21 22 22   Calcium 8.7 - 10.3 mg/dL 9.7  7.7(L) 9.0  Total Protein 6.0 - 8.5 g/dL 7.3 - 7.2  Total Bilirubin 0.0 - 1.2 mg/dL 0.3 - 0.6  Alkaline Phos 39 - 117 IU/L 110 - 80  AST 0 - 40 IU/L 19 - 27  ALT 0 - 32 IU/L 28 - 31    Lipid Panel     Component Value Date/Time   CHOL 198 02/12/2017 1518   TRIG 203 (H) 02/12/2017 1518   HDL 53 02/12/2017 1518   CHOLHDL 3.7 02/12/2017 1518   CHOLHDL 4.3 10/16/2013 1019   VLDL 23 10/16/2013 1019   LDLCALC 104 (H) 02/12/2017 1518    Lab Results  Component Value Date   HGBA1C 6.8 05/15/2017    Assessment & Plan:  1. Type 2 diabetes mellitus with diabetic neuropathy, without long-term current use of insulin (HCC) Controlled with A1c of 6.8 - POCT glucose (manual entry) - POCT glycosylated hemoglobin (Hb A1C) - gabapentin (NEURONTIN) 300 MG capsule; Take 1 capsule (300 mg total) by mouth 3 (three) times daily.  Dispense: 90 capsule; Refill: 3 - metFORMIN (GLUCOPHAGE) 1000 MG tablet; Take 1 tablet (1,000 mg total) by mouth 2 (two) times daily with a meal.  Dispense: 60 tablet; Refill: 3 - sitaGLIPtin (JANUVIA) 25 MG tablet; Take 1 tablet (25 mg total) by mouth daily.  Dispense: 30 tablet; Refill: 3  2. Mixed hyperlipidemia Stable Low cholesterol diet - atorvastatin (LIPITOR) 20 MG tablet; Take 1 tablet (20 mg total) by mouth daily.  Dispense: 30 tablet; Refill: 3  3. Essential hypertension, benign Uncontrolled but BP was controlled at last OV No regimen change today and will reassess at next visit Low sodium diet - hydrochlorothiazide (HYDRODIURIL) 25 MG tablet; Take 1 tablet (25 mg total) by mouth daily.  Dispense: 30 tablet; Refill: 3  4. Seasonal allergies Could explain post nasal drip - cetirizine (ZYRTEC) 10 MG tablet; Take 1 tablet (10 mg total) by mouth daily.  Dispense: 30 tablet; Refill: 1   Meds ordered this encounter  Medications  . cetirizine (ZYRTEC) 10 MG tablet    Sig: Take 1 tablet (10 mg total) by mouth daily.    Dispense:  30 tablet    Refill:  1  .  atorvastatin (LIPITOR) 20 MG tablet    Sig: Take 1 tablet (20 mg total) by mouth daily.    Dispense:  30 tablet    Refill:  3  . gabapentin (NEURONTIN) 300 MG capsule    Sig: Take 1 capsule (300 mg total) by mouth 3 (three) times daily.    Dispense:  90 capsule    Refill:  3  . hydrochlorothiazide (HYDRODIURIL) 25 MG tablet    Sig: Take 1 tablet (25 mg total) by mouth daily.    Dispense:  30 tablet    Refill:  3  . metFORMIN (GLUCOPHAGE) 1000 MG tablet    Sig: Take 1 tablet (1,000 mg total) by mouth 2 (two) times daily with a meal.    Dispense:  60 tablet    Refill:  3  . sitaGLIPtin (JANUVIA) 25 MG tablet    Sig: Take 1 tablet (25 mg total) by mouth daily.    Dispense:  30 tablet    Refill:  3    Follow-up: No follow-ups on file.   [name] MD

## 2017-05-16 ENCOUNTER — Encounter: Payer: Self-pay | Admitting: Family Medicine

## 2017-05-17 ENCOUNTER — Ambulatory Visit (INDEPENDENT_AMBULATORY_CARE_PROVIDER_SITE_OTHER): Payer: BLUE CROSS/BLUE SHIELD | Admitting: Podiatry

## 2017-05-17 DIAGNOSIS — E1149 Type 2 diabetes mellitus with other diabetic neurological complication: Secondary | ICD-10-CM | POA: Diagnosis not present

## 2017-05-17 DIAGNOSIS — M79675 Pain in left toe(s): Secondary | ICD-10-CM | POA: Diagnosis not present

## 2017-05-17 DIAGNOSIS — B351 Tinea unguium: Secondary | ICD-10-CM

## 2017-05-17 DIAGNOSIS — M79674 Pain in right toe(s): Secondary | ICD-10-CM | POA: Diagnosis not present

## 2017-05-17 NOTE — Patient Instructions (Signed)

## 2017-05-20 NOTE — Progress Notes (Signed)
Subjective:   Patient ID: Megan Lane, female   DOB: 62 y.o.   MRN: [id number]   HPI 62 year old female presents the office today for concerns of thick, painful, elongated toenails that she cannot trim herself.  She states the nails are painful with pressure in shoes.  She is diabetic her last blood sugar was 130.  Her last A1c was 6.8.  She denies any claudication symptoms.  She does take gabapentin for neuropathy.  No ulceration she reports.  No other concerns.   Review of Systems  All other systems reviewed and are negative.  Past Medical History:  Diagnosis Date  . Diabetes mellitus   . Hypertension     Past Surgical History:  Procedure Laterality Date  . ABDOMINAL HYSTERECTOMY    . KNEE SURGERY       Current Outpatient Medications:  .  acetaminophen (TYLENOL) 500 MG tablet, Take 2 tablets (1,000 mg total) by mouth every 8 (eight) hours as needed for moderate pain., Disp: 30 tablet, Rfl: 0 .  atorvastatin (LIPITOR) 20 MG tablet, Take 1 tablet (20 mg total) by mouth daily., Disp: 30 tablet, Rfl: 3 .  cetirizine (ZYRTEC) 10 MG tablet, Take 1 tablet (10 mg total) by mouth daily., Disp: 30 tablet, Rfl: 1 .  gabapentin (NEURONTIN) 300 MG capsule, Take 1 capsule (300 mg total) by mouth 3 (three) times daily., Disp: 90 capsule, Rfl: 3 .  glucose blood test strip, Use as instructed, Disp: 100 each, Rfl: 12 .  hydrochlorothiazide (HYDRODIURIL) 25 MG tablet, Take 1 tablet (25 mg total) by mouth daily., Disp: 30 tablet, Rfl: 3 .  ibuprofen (ADVIL,MOTRIN) 600 MG tablet, Take 1 tablet (600 mg total) by mouth every 8 (eight) hours as needed., Disp: 30 tablet, Rfl: 0 .  Lancets (ACCU-CHEK SOFT TOUCH) lancets, Use as instructed, Disp: 100 each, Rfl: 12 .  meloxicam (MOBIC) 15 MG tablet, Take 15 mg by mouth daily., Disp: , Rfl:  .  metFORMIN (GLUCOPHAGE) 1000 MG tablet, Take 1 tablet (1,000 mg total) by mouth 2 (two) times daily with a meal., Disp: 60 tablet, Rfl: 3 .  sitaGLIPtin (JANUVIA)  25 MG tablet, Take 1 tablet (25 mg total) by mouth daily., Disp: 30 tablet, Rfl: 3 .  Vitamin D, Ergocalciferol, (DRISDOL) 50000 units CAPS capsule, , Disp: , Rfl: 5  Allergies  Allergen Reactions  . Penicillins Rash    Social History   Socioeconomic History  . Marital status: Single    Spouse name: Not on file  . Number of children: Not on file  . Years of education: Not on file  . Highest education level: Not on file  Occupational History  . Not on file  Social Needs  . Financial resource strain: Not on file  . Food insecurity:    Worry: Not on file    Inability: Not on file  . Transportation needs:    Medical: Not on file    Non-medical: Not on file  Tobacco Use  . Smoking status: Former Smoker    Last attempt to quit: 08/11/2005    Years since quitting: 11.7  . Smokeless tobacco: Never Used  Substance and Sexual Activity  . Alcohol use: No  . Drug use: No  . Sexual activity: Not Currently    Birth control/protection: Surgical  Lifestyle  . Physical activity:    Days per week: Not on file    Minutes per session: Not on file  . Stress: Not on file  Relationships  .  Social connections:    Talks on phone: Not on file    Gets together: Not on file    Attends religious service: Not on file    Active member of club or organization: Not on file    Attends meetings of clubs or organizations: Not on file    Relationship status: Not on file  . Intimate partner violence:    Fear of current or ex partner: Not on file    Emotionally abused: Not on file    Physically abused: Not on file    Forced sexual activity: Not on file  Other Topics Concern  . Not on file  Social History Narrative  . Not on file        Objective:  Physical Exam  General: AAO x3, NAD  Dermatological: Nails are hypertrophic, dystrophic, brittle, discolored, elongated 10. No surrounding redness or drainage. Tenderness nails 1-5 bilaterally. No open lesions or pre-ulcerative lesions are  identified today.  Vascular: Dorsalis Pedis artery and Posterior Tibial artery pedal pulses are 2/4 bilateral with immedate capillary fill time. There is no pain with calf compression, swelling, warmth, erythema.   Neruologic: Sensation mildly decreased with Simms Weinstein monofilament.  Musculoskeletal: No gross boney pedal deformities bilateral. No pain, crepitus, or limitation noted with foot and ankle range of motion bilateral. Muscular strength 5/5 in all groups tested bilateral.     Assessment:   Symptomatic onychomycosis    Plan:  -Treatment options discussed including all alternatives, risks, and complications -Etiology of symptoms were discussed -Nails debrided 10 without complications or bleeding. -Daily foot inspection -Follow-up in 3 months or sooner if any problems arise. In the meantime, encouraged to call the office with any questions, concerns, change in symptoms.   Celesta Gentile, DPM

## 2017-05-21 ENCOUNTER — Other Ambulatory Visit: Payer: Self-pay | Admitting: Family Medicine

## 2017-05-21 DIAGNOSIS — R252 Cramp and spasm: Secondary | ICD-10-CM

## 2017-05-21 NOTE — Progress Notes (Signed)
Patient has lab appointment on 05/22/17 at 2 pm

## 2017-05-21 NOTE — Progress Notes (Unsigned)
Could you please place this patient on the schedule for labs tomorrow?  Thank you

## 2017-05-22 ENCOUNTER — Ambulatory Visit: Payer: BLUE CROSS/BLUE SHIELD | Attending: Family Medicine

## 2017-05-22 DIAGNOSIS — R252 Cramp and spasm: Secondary | ICD-10-CM | POA: Diagnosis not present

## 2017-05-22 NOTE — Progress Notes (Signed)
Patient here for lab visit only 

## 2017-05-23 LAB — BASIC METABOLIC PANEL
BUN / CREAT RATIO: 16 (ref 12–28)
BUN: 14 mg/dL (ref 8–27)
CHLORIDE: 107 mmol/L — AB (ref 96–106)
CO2: 22 mmol/L (ref 20–29)
Calcium: 9.3 mg/dL (ref 8.7–10.3)
Creatinine, Ser: 0.9 mg/dL (ref 0.57–1.00)
GFR calc non Af Amer: 70 mL/min/{1.73_m2} (ref >59)
GFR, EST AFRICAN AMERICAN: 80 mL/min/{1.73_m2} (ref >59)
Glucose: 131 mg/dL — ABNORMAL HIGH (ref 65–99)
POTASSIUM: 4.3 mmol/L (ref 3.5–5.2)
Sodium: 144 mmol/L (ref 134–144)

## 2017-05-24 ENCOUNTER — Telehealth: Payer: Self-pay | Admitting: Family Medicine

## 2017-05-24 ENCOUNTER — Telehealth: Payer: Self-pay

## 2017-05-24 NOTE — Telephone Encounter (Signed)
Patient was informed and verbalized understanding of labs results. Patient did request to be put on a muscle relaxer. Please fu at your earliest convenience.

## 2017-05-24 NOTE — Telephone Encounter (Signed)
Patient was called and informed to contact office for lab results. 

## 2017-05-28 NOTE — Telephone Encounter (Signed)
Noted  

## 2017-07-26 ENCOUNTER — Other Ambulatory Visit: Payer: Self-pay

## 2017-07-26 ENCOUNTER — Encounter (HOSPITAL_COMMUNITY): Payer: Self-pay | Admitting: *Deleted

## 2017-07-26 ENCOUNTER — Emergency Department (HOSPITAL_COMMUNITY): Payer: BLUE CROSS/BLUE SHIELD

## 2017-07-26 ENCOUNTER — Emergency Department (HOSPITAL_COMMUNITY)
Admission: EM | Admit: 2017-07-26 | Discharge: 2017-07-26 | Disposition: A | Discharge status: Home / Self Care | Payer: BLUE CROSS/BLUE SHIELD | Attending: Emergency Medicine | Admitting: Emergency Medicine

## 2017-07-26 DIAGNOSIS — Z79899 Other long term (current) drug therapy: Secondary | ICD-10-CM | POA: Insufficient documentation

## 2017-07-26 DIAGNOSIS — I10 Essential (primary) hypertension: Secondary | ICD-10-CM | POA: Insufficient documentation

## 2017-07-26 DIAGNOSIS — E119 Type 2 diabetes mellitus without complications: Secondary | ICD-10-CM | POA: Diagnosis not present

## 2017-07-26 DIAGNOSIS — Z87891 Personal history of nicotine dependence: Secondary | ICD-10-CM | POA: Diagnosis not present

## 2017-07-26 DIAGNOSIS — R079 Chest pain, unspecified: Secondary | ICD-10-CM | POA: Diagnosis not present

## 2017-07-26 DIAGNOSIS — J069 Acute upper respiratory infection, unspecified: Secondary | ICD-10-CM | POA: Insufficient documentation

## 2017-07-26 DIAGNOSIS — Z7984 Long term (current) use of oral hypoglycemic drugs: Secondary | ICD-10-CM | POA: Diagnosis not present

## 2017-07-26 DIAGNOSIS — R05 Cough: Secondary | ICD-10-CM | POA: Diagnosis not present

## 2017-07-26 LAB — CBC
HEMATOCRIT: 41.1 % (ref 36.0–46.0)
Hemoglobin: 13.3 g/dL (ref 12.0–15.0)
MCH: 28.2 pg (ref 26.0–34.0)
MCHC: 32.4 g/dL (ref 30.0–36.0)
MCV: 87.1 fL (ref 78.0–100.0)
Platelets: 373 10*3/uL (ref 150–400)
RBC: 4.72 MIL/uL (ref 3.87–5.11)
RDW: 13.9 % (ref 11.5–15.5)
WBC: 7.7 10*3/uL (ref 4.0–10.5)

## 2017-07-26 LAB — BASIC METABOLIC PANEL
Anion gap: 12 (ref 5–15)
BUN: 10 mg/dL (ref 6–20)
CHLORIDE: 102 mmol/L (ref 101–111)
CO2: 26 mmol/L (ref 22–32)
Calcium: 9.4 mg/dL (ref 8.9–10.3)
Creatinine, Ser: 0.84 mg/dL (ref 0.44–1.00)
GFR calc Af Amer: >60 mL/min (ref >60)
GFR calc non Af Amer: >60 mL/min (ref >60)
Glucose, Bld: 186 mg/dL — ABNORMAL HIGH (ref 65–99)
POTASSIUM: 4.1 mmol/L (ref 3.5–5.1)
SODIUM: 140 mmol/L (ref 135–145)

## 2017-07-26 LAB — I-STAT TROPONIN, ED: Troponin i, poc: 0 ng/mL (ref 0.00–0.08)

## 2017-07-26 IMAGING — CR DG CHEST 2V
2 series · 2 of 2 positions shown · non-contrast
Comparison: [DATE]

CLINICAL DATA: Cough and congestion for 3 days.

EXAM:
CHEST - 2 VIEW

[chest pa]
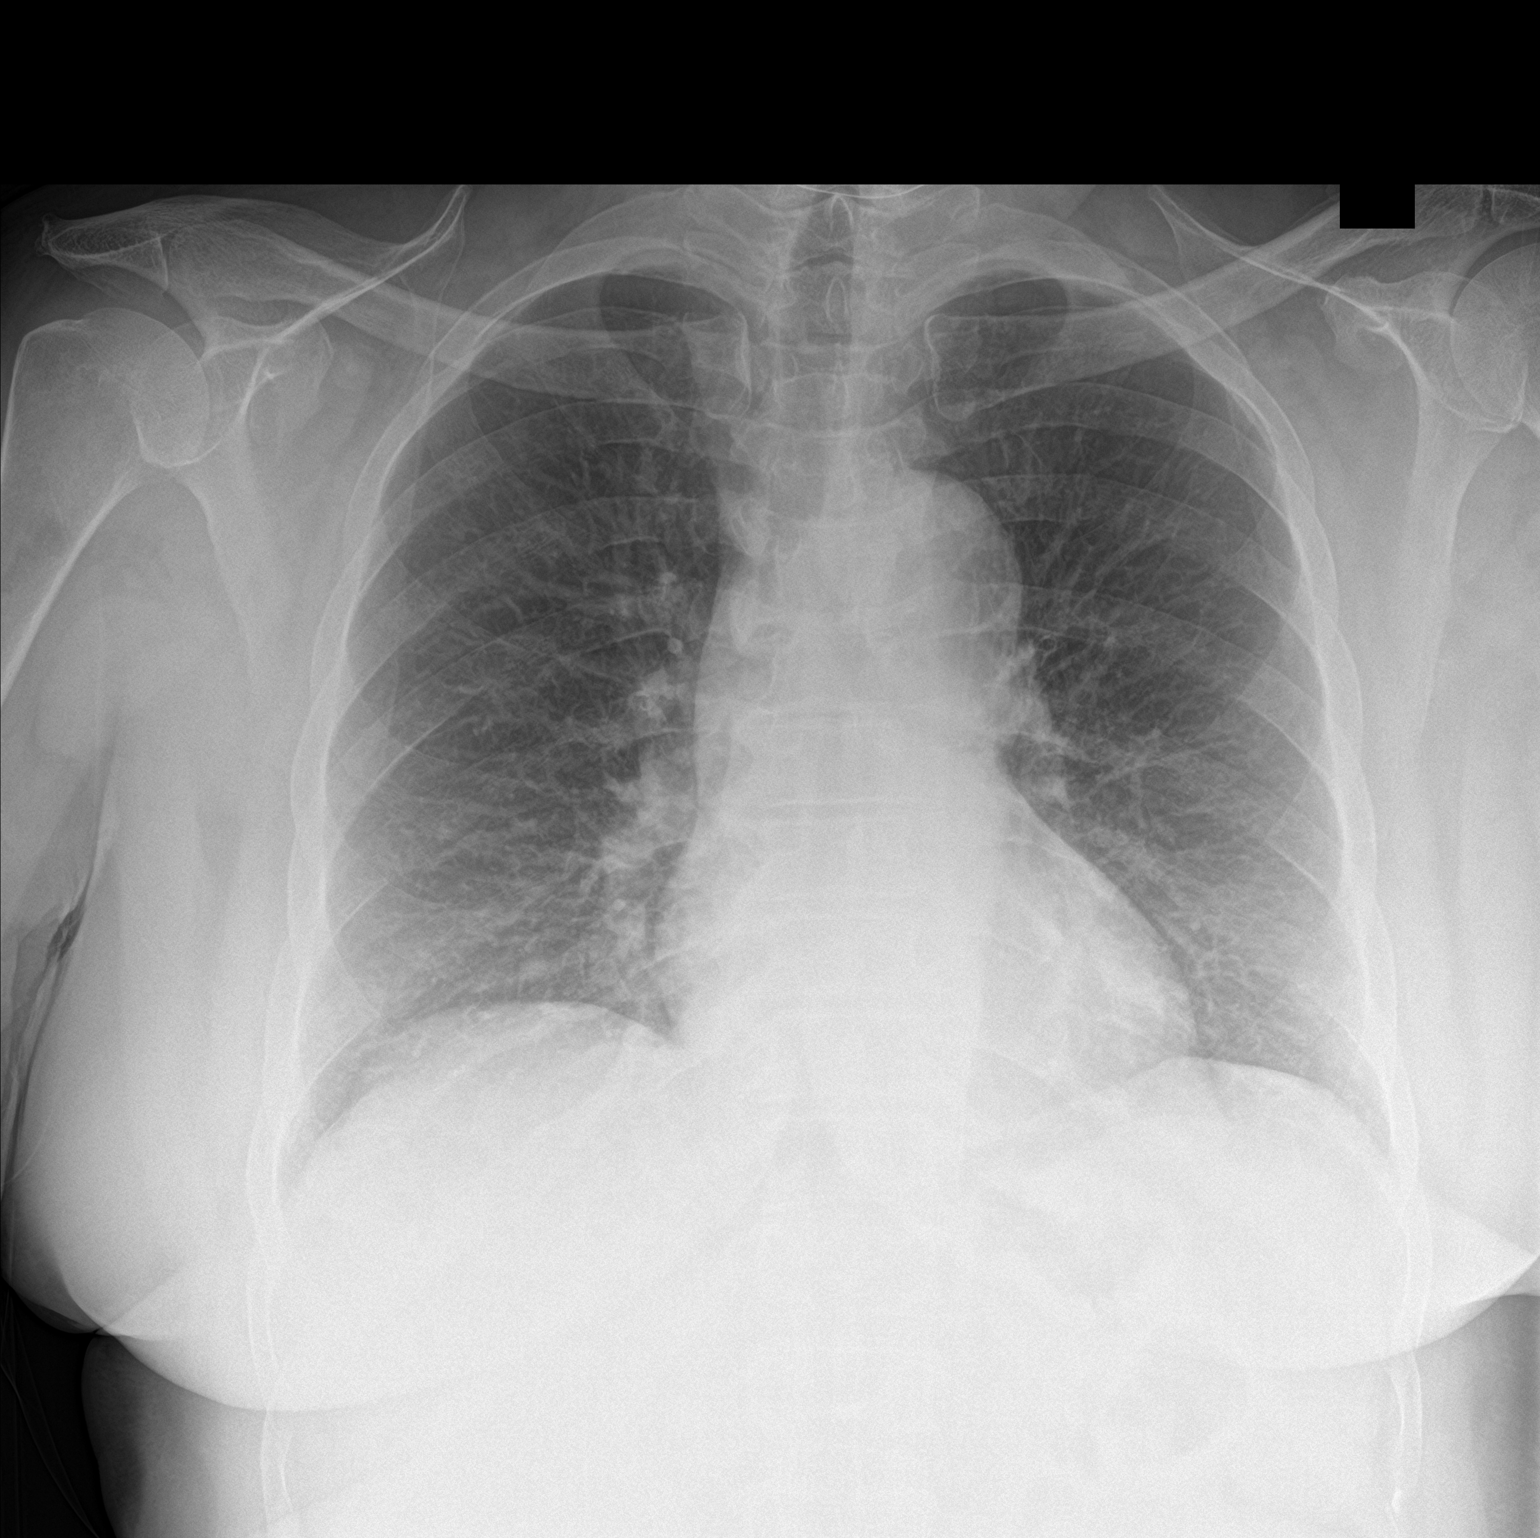

[chest lat]
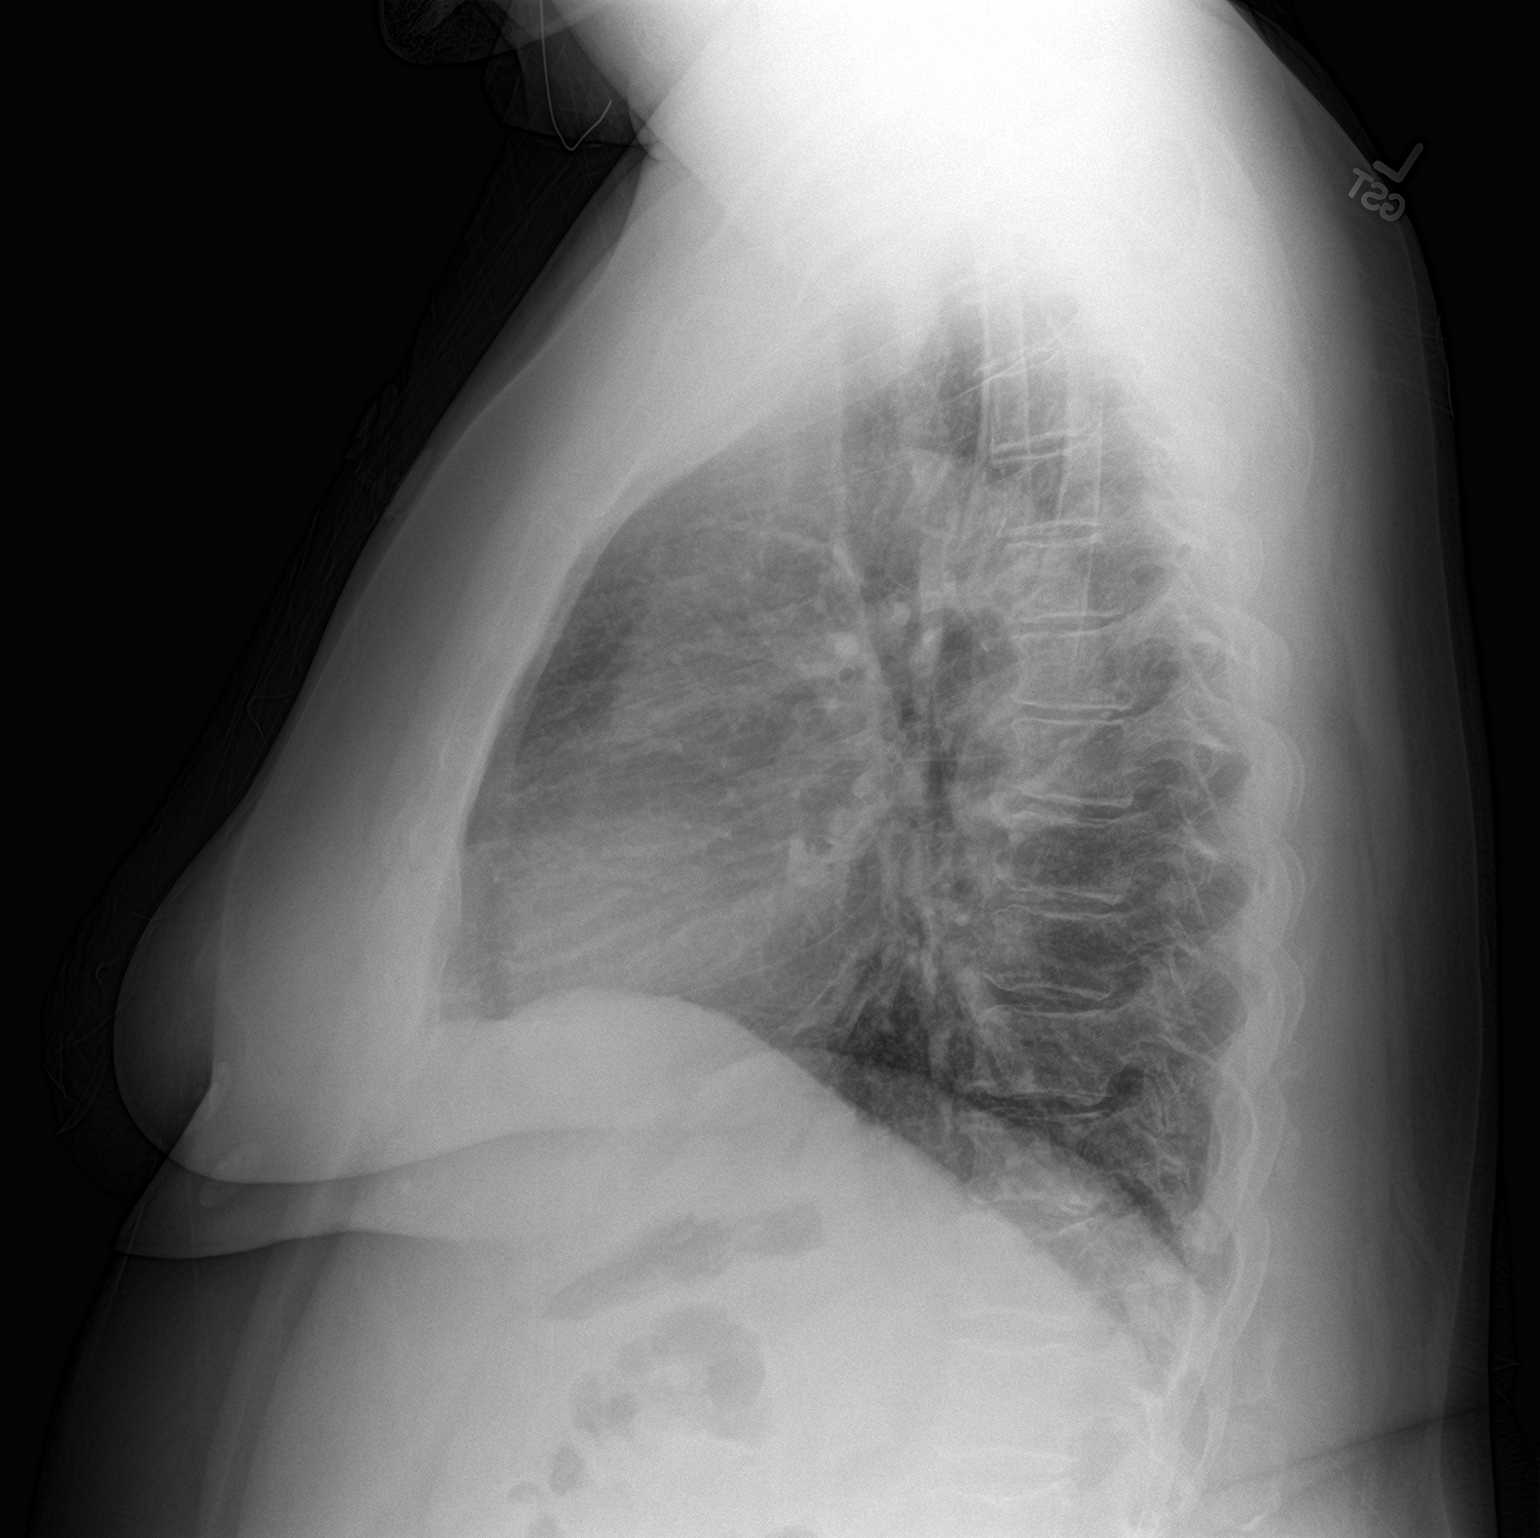

[2 of 2 positions shown; findings below may reference images not displayed]

FINDINGS: Heart size and pulmonary vascularity are normal. Improvement of
previous basilar infiltrates. No consolidation or airspace disease
seen today. No blunting of costophrenic angles. No pneumothorax.
Degenerative changes in the spine.
IMPRESSION: No active cardiopulmonary disease.

## 2017-07-26 MED ORDER — AZITHROMYCIN 250 MG PO TABS: 250.0000 mg | ORAL_TABLET | Freq: Every day | ORAL | 0 refills | Status: DC

## 2017-07-26 NOTE — Discharge Instructions (Signed)
Make sure you are taking Tylenol as needed for fever, aches and pains.  Drink plenty of fluids and rest.  Need to return to the emergency department if you develop shortness of breath or persistent vomiting.  Make sure you follow-up with your doctor by Monday if you are not feeling better.

## 2017-07-26 NOTE — ED Notes (Signed)
Discharge instructions and prescriptions discussed with Pt. Pt verbalized understanding. Pt stable and ambulatory.   

## 2017-07-26 NOTE — ED Provider Notes (Signed)
[facility] Provider Note   CSN: [id number] Arrival date & time: 07/26/17  0126     History   Chief Complaint Chief Complaint  Patient presents with  . Chest Pain  . Cough    HPI [facility] is a 62 y.o. female.  The history is provided by the patient.  Cough  Associated symptoms include chest pain and sore throat. Pertinent negatives include no rhinorrhea and no wheezing.  URI   This is a new problem. Episode onset: 3-4 days ago. The problem has been gradually worsening. The maximum temperature recorded prior to her arrival was 100 to 100.9 F. The fever has been present for 1 to 2 days. Associated symptoms include chest pain, nausea, congestion, sore throat and cough. Pertinent negatives include no plugged ear sensation, no rhinorrhea and no wheezing. Associated symptoms comments: Cough productive of green and yellow sputum.  When coughing it makes her throat and entire chest hurt.  She is not having chest pain only with coughing.  2 days ago she also developed a hoarse voice.. Treatments tried: otc cold medications. The treatment provided no relief.    Past Medical History:  Diagnosis Date  . Diabetes mellitus   . Hypertension     Patient Active Problem List   Diagnosis Date Noted  . Acute bronchitis 07/09/2016  . CAP (community acquired pneumonia) 07/08/2016  . Chest pain 07/08/2016  . Headache 07/08/2016  . Weakness 07/08/2016  . HLD (hyperlipidemia) 07/08/2016  . Diabetes mellitus due to underlying condition without complications ([place]) 95/28/4132  . Back muscle spasm 04/30/2013  . Nasal congestion 04/30/2013  . Essential hypertension, benign 04/30/2013  . Pap smear for cervical cancer screening 03/20/2013    Past Surgical History:  Procedure Laterality Date  . ABDOMINAL HYSTERECTOMY    . KNEE SURGERY       OB History   None      Home Medications    Prior to Admission medications   Medication Sig Start Date  End Date Taking? Authorizing Provider  acetaminophen (TYLENOL) 500 MG tablet Take 2 tablets (1,000 mg total) by mouth every 8 (eight) hours as needed for moderate pain. 02/12/17   Alfonse Spruce, FNP  atorvastatin (LIPITOR) 20 MG tablet Take 1 tablet (20 mg total) by mouth daily. 05/15/17   [name], MD  cetirizine (ZYRTEC) 10 MG tablet Take 1 tablet (10 mg total) by mouth daily. 05/15/17   [name], MD  gabapentin (NEURONTIN) 300 MG capsule Take 1 capsule (300 mg total) by mouth 3 (three) times daily. 05/15/17   [name], MD  glucose blood test strip Use as instructed 02/12/17   Alfonse Spruce, FNP  hydrochlorothiazide (HYDRODIURIL) 25 MG tablet Take 1 tablet (25 mg total) by mouth daily. 05/15/17   [name], MD  ibuprofen (ADVIL,MOTRIN) 600 MG tablet Take 1 tablet (600 mg total) by mouth every 8 (eight) hours as needed. 12/04/16   Alfonse Spruce, FNP  Lancets (ACCU-CHEK SOFT TOUCH) lancets Use as instructed 02/12/17   Alfonse Spruce, FNP  meloxicam (MOBIC) 15 MG tablet Take 15 mg by mouth daily. 06/01/16   [provider]  metFORMIN (GLUCOPHAGE) 1000 MG tablet Take 1 tablet (1,000 mg total) by mouth 2 (two) times daily with a meal. 05/15/17   Newlin, Enobong, MD  sitaGLIPtin (JANUVIA) 25 MG tablet Take 1 tablet (25 mg total) by mouth daily. 05/15/17   [name], MD  Vitamin D, Ergocalciferol, (DRISDOL) 50000 units  CAPS capsule  04/30/17   [provider]    Family History Family History  Problem Relation Age of Onset  . Diabetes Mother   . Hypertension Mother   . Stroke Mother   . Heart disease Brother   . Breast cancer Sister 55    Social History Social History   Tobacco Use  . Smoking status: Former Smoker    Last attempt to quit: 08/11/2005    Years since quitting: 11.9  . Smokeless tobacco: Never Used  Substance Use Topics  . Alcohol use: No  . Drug use: No     Allergies   Penicillins   Review of Systems Review of  Systems  HENT: Positive for congestion and sore throat. Negative for rhinorrhea.   Respiratory: Positive for cough. Negative for wheezing.   Cardiovascular: Positive for chest pain.  Gastrointestinal: Positive for nausea.  All other systems reviewed and are negative.    Physical Exam Updated Vital Signs BP (!) 167/100 (BP Location: Right Arm)   Pulse (!) 102   Temp 99 F (37.2 C) (Oral)   Resp 16   SpO2 99%   Physical Exam  Constitutional: She is oriented to person, place, and time. She appears well-developed and well-nourished. No distress.  HENT:  Head: Normocephalic and atraumatic.  Right Ear: Tympanic membrane normal.  Left Ear: Tympanic membrane normal.  Nose: Mucosal edema present.  Mouth/Throat: Oropharynx is clear and moist. No oropharyngeal exudate, posterior oropharyngeal edema or posterior oropharyngeal erythema.  Eyes: Pupils are equal, round, and reactive to light. Conjunctivae and EOM are normal.  Neck: Normal range of motion. Neck supple.  Cardiovascular: Regular rhythm and intact distal pulses. Tachycardia present.  No murmur heard. Pulmonary/Chest: Effort normal and breath sounds normal. No respiratory distress. She has no wheezes. She has no rales.  Abdominal: Soft. She exhibits no distension. There is no tenderness. There is no rebound and no guarding.  Musculoskeletal: Normal range of motion. She exhibits no edema or tenderness.  Neurological: She is alert and oriented to person, place, and time.  Skin: Skin is warm and dry. No rash noted. No erythema.  Psychiatric: She has a normal mood and affect. Her behavior is normal.  Nursing note and vitals reviewed.    ED Treatments / Results  Labs (all labs ordered are listed, but only abnormal results are displayed) Labs Reviewed  BASIC METABOLIC PANEL - Abnormal; Notable for the following components:      Result Value   Glucose, Bld 186 (*)    All other components within normal limits  CBC  I-STAT  TROPONIN, ED    EKG EKG Interpretation  Date/Time:  Thursday July 26 2017 01:39:33 EDT Ventricular Rate:  96 PR Interval:  132 QRS Duration: 86 QT Interval:  374 QTC Calculation: 472 R Axis:   5 Text Interpretation:  Normal sinus rhythm Normal ECG No significant change since last tracing Confirmed by Blanchie Dessert [phone]) on 07/26/2017 2:36:48 AM   Radiology Dg Chest 2 View  Result Date: 07/26/2017 CLINICAL DATA:  Cough and congestion for 3 days. EXAM: CHEST - 2 VIEW COMPARISON:  07/09/2016 FINDINGS: Heart size and pulmonary vascularity are normal. Improvement of previous basilar infiltrates. No consolidation or airspace disease seen today. No blunting of costophrenic angles. No pneumothorax. Degenerative changes in the spine. IMPRESSION: No active cardiopulmonary disease. Electronically Signed   By: Lucienne Capers M.D.   On: 07/26/2017 02:10    Procedures Procedures (including critical care time)  Medications Ordered in ED  Medications - No data to display   Initial Impression / Assessment and Plan / ED Course  I have reviewed the triage vital signs and the nursing notes.  Pertinent labs & imaging results that were available during my care of the patient were reviewed by me and considered in my medical decision making (see chart for details).     Pt with symptoms consistent with URI vs developing PNA.  Well appearing here.  No signs of breathing difficulty  No signs of pharyngitis, otitis or abnormal abdominal findings.   CXR wnl and CBC, BMP and trop wnl.  EKg without acute findings.  Patient's history is not consistent with ACS type pain or PE.  Patient's symptoms seem to be infectious.  They seem to have worsened over the last 4 days.  Patient does not have a history of lung pathology and is not a smoker.  Patient will be covered with azithromycin due to concern for developing pneumonia.  She was instructed to use Tylenol as needed for pain and fever and pt to return with  any further problems.   Final Clinical Impressions(s) / ED Diagnoses   Final diagnoses:  Upper respiratory tract infection, unspecified type    ED Discharge Orders        Ordered    azithromycin (ZITHROMAX) 250 MG tablet  Daily     07/26/17 0315       Blanchie Dessert, MD 07/26/17 [phone]

## 2017-07-26 NOTE — ED Triage Notes (Signed)
Pt has been having cough and hoarse voice with chest pain for the past 3 days. CP worsens with inspiration and during coughing.

## 2017-08-16 ENCOUNTER — Ambulatory Visit: Payer: BLUE CROSS/BLUE SHIELD | Admitting: Podiatry

## 2017-08-20 ENCOUNTER — Ambulatory Visit: Payer: BLUE CROSS/BLUE SHIELD | Attending: Family Medicine | Admitting: Family Medicine

## 2017-08-20 VITALS — BP 106/70 | HR 70 | Temp 98.6°F | Resp 18 | Ht 72.0 in | Wt 236.0 lb

## 2017-08-20 DIAGNOSIS — Z7984 Long term (current) use of oral hypoglycemic drugs: Secondary | ICD-10-CM | POA: Insufficient documentation

## 2017-08-20 DIAGNOSIS — R894 Abnormal immunological findings in specimens from other organs, systems and tissues: Secondary | ICD-10-CM | POA: Insufficient documentation

## 2017-08-20 DIAGNOSIS — E1140 Type 2 diabetes mellitus with diabetic neuropathy, unspecified: Secondary | ICD-10-CM

## 2017-08-20 DIAGNOSIS — E782 Mixed hyperlipidemia: Secondary | ICD-10-CM | POA: Diagnosis not present

## 2017-08-20 DIAGNOSIS — L299 Pruritus, unspecified: Secondary | ICD-10-CM | POA: Diagnosis not present

## 2017-08-20 DIAGNOSIS — Z79899 Other long term (current) drug therapy: Secondary | ICD-10-CM | POA: Insufficient documentation

## 2017-08-20 DIAGNOSIS — Z880 Allergy status to penicillin: Secondary | ICD-10-CM | POA: Diagnosis not present

## 2017-08-20 DIAGNOSIS — R399 Unspecified symptoms and signs involving the genitourinary system: Secondary | ICD-10-CM

## 2017-08-20 DIAGNOSIS — E1169 Type 2 diabetes mellitus with other specified complication: Secondary | ICD-10-CM

## 2017-08-20 DIAGNOSIS — B009 Herpesviral infection, unspecified: Secondary | ICD-10-CM | POA: Diagnosis not present

## 2017-08-20 DIAGNOSIS — M79601 Pain in right arm: Secondary | ICD-10-CM | POA: Diagnosis not present

## 2017-08-20 DIAGNOSIS — I10 Essential (primary) hypertension: Secondary | ICD-10-CM

## 2017-08-20 LAB — POCT URINALYSIS DIPSTICK
GLUCOSE UA: NEGATIVE
LEUKOCYTES UA: NEGATIVE
Nitrite, UA: NEGATIVE
Protein, UA: POSITIVE — AB
RBC UA: NEGATIVE
Urobilinogen, UA: 1 E.U./dL
pH, UA: 5 (ref 5.0–8.0)

## 2017-08-20 LAB — GLUCOSE, POCT (MANUAL RESULT ENTRY): POC GLUCOSE: 144 mg/dL — AB (ref 70–99)

## 2017-08-20 MED ORDER — METFORMIN HCL 1000 MG PO TABS: 1000.0000 mg | ORAL_TABLET | Freq: Two times a day (BID) | ORAL | 3 refills | Status: DC

## 2017-08-20 MED ORDER — GABAPENTIN 300 MG PO CAPS: 600.0000 mg | ORAL_CAPSULE | Freq: Two times a day (BID) | ORAL | 3 refills | Status: DC

## 2017-08-20 MED ORDER — HYDROCHLOROTHIAZIDE 25 MG PO TABS: 25.0000 mg | ORAL_TABLET | Freq: Every day | ORAL | 3 refills | Status: DC

## 2017-08-20 MED ORDER — HYDROCORTISONE 0.5 % EX CREA: 1.0000 "application " | TOPICAL_CREAM | Freq: Two times a day (BID) | CUTANEOUS | 0 refills | Status: DC

## 2017-08-20 MED ORDER — ATORVASTATIN CALCIUM 20 MG PO TABS: 20.0000 mg | ORAL_TABLET | Freq: Every day | ORAL | 3 refills | Status: DC

## 2017-08-20 MED ORDER — SITAGLIPTIN PHOSPHATE 25 MG PO TABS
25.0000 mg | ORAL_TABLET | Freq: Every day | ORAL | 3 refills | Status: DC
Start: 2017-08-20 — End: 2017-11-20

## 2017-08-20 NOTE — Progress Notes (Signed)
Subjective:  Patient ID: Megan Lane, female    DOB: Jul 19, 1955  Age: 62 y.o. MRN: [id number]  CC: Diabetes   HPI Megan Lane  is a 62 year old female with Type 2 Diabetes Mellitus (A1c 6.8), Diabetic neuropathy, Hyperlipidemia, Hypertension here for a follow-up visit. She complains of dysuria for the last 1 week and took OTC Monistat with no much relief in symptoms.  She denies vaginal itching or vaginal discharge. She complains of right arm numbness and right arm pain which is described as "being there".  Currently takes gabapentin for diabetic neuropathy. With regards to her diabetes mellitus, she is doing well on her medications and denies hypoglycemia or visual concerns. Tolerates her antihypertensive on a statin with no complaints of myalgias or other adverse effects. She has noticed intermittent forgetfulness which she describes as walking into the room and forgetting what she went there for but on exiting she suddenly remembers and goes back to get it.  Denies missing her way while driving, forgetting to turn of the stool she is up-to-date on managing her finances and her family and friends have not complained about memory lapses with her.  Past Medical History:  Diagnosis Date  . Diabetes mellitus   . Hypertension     Past Surgical History:  Procedure Laterality Date  . ABDOMINAL HYSTERECTOMY    . KNEE SURGERY      Allergies  Allergen Reactions  . Penicillins Rash    Outpatient Medications Prior to Visit  Medication Sig Dispense Refill  . acetaminophen (TYLENOL) 500 MG tablet Take 2 tablets (1,000 mg total) by mouth every 8 (eight) hours as needed for moderate pain. 30 tablet 0  . cetirizine (ZYRTEC) 10 MG tablet Take 1 tablet (10 mg total) by mouth daily. 30 tablet 1  . glucose blood test strip Use as instructed 100 each 12  . ibuprofen (ADVIL,MOTRIN) 600 MG tablet Take 1 tablet (600 mg total) by mouth every 8 (eight) hours as needed. 30 tablet 0  . Lancets  (ACCU-CHEK SOFT TOUCH) lancets Use as instructed 100 each 12  . meloxicam (MOBIC) 15 MG tablet Take 15 mg by mouth daily.    . Vitamin D, Ergocalciferol, (DRISDOL) 50000 units CAPS capsule   5  . atorvastatin (LIPITOR) 20 MG tablet Take 1 tablet (20 mg total) by mouth daily. 30 tablet 3  . azithromycin (ZITHROMAX) 250 MG tablet Take 1 tablet (250 mg total) by mouth daily. Take first 2 tablets together, then 1 every day until finished. 6 tablet 0  . gabapentin (NEURONTIN) 300 MG capsule Take 1 capsule (300 mg total) by mouth 3 (three) times daily. 90 capsule 3  . hydrochlorothiazide (HYDRODIURIL) 25 MG tablet Take 1 tablet (25 mg total) by mouth daily. 30 tablet 3  . metFORMIN (GLUCOPHAGE) 1000 MG tablet Take 1 tablet (1,000 mg total) by mouth 2 (two) times daily with a meal. 60 tablet 3  . sitaGLIPtin (JANUVIA) 25 MG tablet Take 1 tablet (25 mg total) by mouth daily. 30 tablet 3   No facility-administered medications prior to visit.     ROS Review of Systems  Constitutional: Negative for activity change, appetite change and fatigue.  HENT: Negative for congestion, sinus pressure and sore throat.   Eyes: Negative for visual disturbance.  Respiratory: Negative for cough, chest tightness, shortness of breath and wheezing.   Cardiovascular: Negative for chest pain and palpitations.  Gastrointestinal: Negative for abdominal distention, abdominal pain and constipation.  Endocrine: Negative for polydipsia.  Genitourinary: Positive for dysuria. Negative for frequency.  Musculoskeletal: Negative for arthralgias and back pain.  Skin: Negative for rash.  Neurological: Negative for tremors, light-headedness and numbness.  Hematological: Does not bruise/bleed easily.  Psychiatric/Behavioral: Negative for agitation and behavioral problems.    Objective:  BP 106/70 (BP Location: Left Arm, Patient Position: Sitting, Cuff Size: Large)   Pulse 70   Temp 98.6 F (37 C) (Oral)   Resp 18   Ht 6'  (1.829 m)   Wt 236 lb (107 kg)   SpO2 100%   BMI 32.01 kg/m   BP/Weight 08/20/2017 4/33/2951 09/13/4164  Systolic BP 063 016 010  Diastolic BP 70 83 95  Wt. (Lbs) 236 - 241.2  BMI 32.01 - 32.71      Physical Exam  Constitutional: She is oriented to person, place, and time. She appears well-developed and well-nourished.  Cardiovascular: Normal rate, normal heart sounds and intact distal pulses.  No murmur heard. Pulmonary/Chest: Effort normal and breath sounds normal. She has no wheezes. She has no rales. She exhibits no tenderness.  Abdominal: Soft. Bowel sounds are normal. She exhibits no distension and no mass. There is no tenderness.  Musculoskeletal: Normal range of motion.  Neurological: She is alert and oriented to person, place, and time.  Skin: Skin is warm and dry.  Psychiatric: She has a normal mood and affect.     CMP Latest Ref Rng & Units 07/26/2017 05/22/2017 02/12/2017  Glucose 65 - 99 mg/dL 186(H) 131(H) 122(H)  BUN 6 - 20 mg/dL 10 14 12   Creatinine 0.44 - 1.00 mg/dL 0.84 0.90 0.85  Sodium 135 - 145 mmol/L 140 144 143  Potassium 3.5 - 5.1 mmol/L 4.1 4.3 4.2  Chloride 101 - 111 mmol/L 102 107(H) 105  CO2 22 - 32 mmol/L 26 22 21   Calcium 8.9 - 10.3 mg/dL 9.4 9.3 9.7  Total Protein 6.0 - 8.5 g/dL - - 7.3  Total Bilirubin 0.0 - 1.2 mg/dL - - 0.3  Alkaline Phos 39 - 117 IU/L - - 110  AST 0 - 40 IU/L - - 19  ALT 0 - 32 IU/L - - 28    Lipid Panel     Component Value Date/Time   CHOL 198 02/12/2017 1518   TRIG 203 (H) 02/12/2017 1518   HDL 53 02/12/2017 1518   CHOLHDL 3.7 02/12/2017 1518   CHOLHDL 4.3 10/16/2013 1019   VLDL 23 10/16/2013 1019   LDLCALC 104 (H) 02/12/2017 1518    Lab Results  Component Value Date   HGBA1C 6.8 05/15/2017    Assessment & Plan:   1. Type 2 diabetes mellitus with other specified complication, without long-term current use of insulin (HCC) Controlled with A1c of 6.8 Continue current regimen Counseled on Diabetic diet, my  plate method, 932 minutes of moderate intensity exercise/week Keep blood sugar logs with fasting goals of 80-120 mg/dl, random of less than 180 and in the event of sugars less than 60 mg/dl or greater than 400 mg/dl please notify the clinic ASAP. It is recommended that you undergo annual eye exams and annual foot exams. Pneumonia vaccine is recommended.  - Hemoglobin A1c - Glucose (CBG) - Urinalysis Dipstick  2. Mixed hyperlipidemia Stable Low-cholesterol diet - atorvastatin (LIPITOR) 20 MG tablet; Take 1 tablet (20 mg total) by mouth daily.  Dispense: 30 tablet; Refill: 3  3. Type 2 diabetes mellitus with diabetic neuropathy, without long-term current use of insulin (HCC) Increased dose of gabapentin due to complains of hand pains -  sitaGLIPtin (JANUVIA) 25 MG tablet; Take 1 tablet (25 mg total) by mouth daily.  Dispense: 30 tablet; Refill: 3 - metFORMIN (GLUCOPHAGE) 1000 MG tablet; Take 1 tablet (1,000 mg total) by mouth 2 (two) times daily with a meal.  Dispense: 60 tablet; Refill: 3 - gabapentin (NEURONTIN) 300 MG capsule; Take 2 capsules (600 mg total) by mouth 2 (two) times daily.  Dispense: 120 capsule; Refill: 3  4. Essential hypertension, benign Controlled Counseled on blood pressure goal of less than 130/80, low-sodium, DASH diet, medication compliance, 150 minutes of moderate intensity exercise per week. Discussed medication compliance, adverse effects. - hydrochlorothiazide (HYDRODIURIL) 25 MG tablet; Take 1 tablet (25 mg total) by mouth daily.  Dispense: 30 tablet; Refill: 3  5. Urinary symptom or sign Urinalysis negative for UTI - Urine cytology ancillary only - Urine Culture  6. Pruritus Pruritus of hands Placed on hydrocortisone  7. Pain of right upper extremity Possibly diabetic neuropathy Increased dose of gabapentin  8. 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 atorvastatin (LIPITOR) 20 MG tablet    Sig: Take 1 tablet (20 mg total) by mouth daily.    Dispense:  30 tablet    Refill:  3  . sitaGLIPtin (JANUVIA) 25 MG tablet    Sig: Take 1 tablet (25 mg total) by mouth daily.    Dispense:  30 tablet    Refill:  3  . metFORMIN (GLUCOPHAGE) 1000 MG tablet    Sig: Take 1 tablet (1,000 mg total) by mouth 2 (two) times daily with a meal.    Dispense:  60 tablet    Refill:  3  . hydrochlorothiazide (HYDRODIURIL) 25 MG tablet    Sig: Take 1 tablet (25 mg total) by mouth daily.    Dispense:  30 tablet    Refill:  3  . gabapentin (NEURONTIN) 300 MG capsule    Sig: Take 2 capsules (600 mg total) by mouth 2 (two) times daily.    Dispense:  120 capsule    Refill:  3  . hydrocortisone cream 0.5 %    Sig: Apply 1 application topically 2 (two) times daily.    Dispense:  30 g    Refill:  0    Follow-up: Return in about 3 months (around 11/20/2017) for follow up of chronic medical conditions.   [name] MD

## 2017-08-20 NOTE — Patient Instructions (Signed)

## 2017-08-21 ENCOUNTER — Encounter: Payer: Self-pay | Admitting: Family Medicine

## 2017-08-21 LAB — URINE CYTOLOGY ANCILLARY ONLY
Chlamydia: NEGATIVE
NEISSERIA GONORRHEA: NEGATIVE
TRICH (WINDOWPATH): NEGATIVE

## 2017-08-22 LAB — URINE CULTURE

## 2017-08-23 ENCOUNTER — Telehealth: Payer: Self-pay

## 2017-08-23 ENCOUNTER — Other Ambulatory Visit: Payer: Self-pay | Admitting: Family Medicine

## 2017-08-23 LAB — URINE CYTOLOGY ANCILLARY ONLY: Candida vaginitis: NEGATIVE

## 2017-08-23 MED ORDER — METRONIDAZOLE 0.75 % VA GEL: 1.0000 | Freq: Every day | VAGINAL | 0 refills | Status: DC

## 2017-08-23 NOTE — Telephone Encounter (Signed)
Patient was called and informed to contact office for lab results.   If patient returns phone call please inform patient that urine test was negative but reveled BV and Metrogel was sent to pharmacy.

## 2017-08-28 ENCOUNTER — Telehealth: Payer: Self-pay | Admitting: Family Medicine

## 2017-08-28 NOTE — Telephone Encounter (Signed)
Patient called requesting her urine results and patient was informed of results and had no questions.

## 2017-11-20 ENCOUNTER — Ambulatory Visit: Payer: BLUE CROSS/BLUE SHIELD | Attending: Family Medicine | Admitting: Family Medicine

## 2017-11-20 ENCOUNTER — Encounter: Payer: Self-pay | Admitting: Family Medicine

## 2017-11-20 VITALS — BP 150/82 | HR 77 | Temp 97.8°F | Ht 72.0 in | Wt 238.0 lb

## 2017-11-20 DIAGNOSIS — E1140 Type 2 diabetes mellitus with diabetic neuropathy, unspecified: Secondary | ICD-10-CM | POA: Insufficient documentation

## 2017-11-20 DIAGNOSIS — Z7984 Long term (current) use of oral hypoglycemic drugs: Secondary | ICD-10-CM | POA: Insufficient documentation

## 2017-11-20 DIAGNOSIS — Z880 Allergy status to penicillin: Secondary | ICD-10-CM | POA: Diagnosis not present

## 2017-11-20 DIAGNOSIS — M25561 Pain in right knee: Secondary | ICD-10-CM | POA: Diagnosis not present

## 2017-11-20 DIAGNOSIS — I10 Essential (primary) hypertension: Secondary | ICD-10-CM | POA: Insufficient documentation

## 2017-11-20 DIAGNOSIS — J302 Other seasonal allergic rhinitis: Secondary | ICD-10-CM | POA: Diagnosis not present

## 2017-11-20 DIAGNOSIS — M79675 Pain in left toe(s): Secondary | ICD-10-CM | POA: Insufficient documentation

## 2017-11-20 DIAGNOSIS — Z79899 Other long term (current) drug therapy: Secondary | ICD-10-CM | POA: Insufficient documentation

## 2017-11-20 DIAGNOSIS — N3941 Urge incontinence: Secondary | ICD-10-CM | POA: Diagnosis not present

## 2017-11-20 DIAGNOSIS — E089 Diabetes mellitus due to underlying condition without complications: Secondary | ICD-10-CM

## 2017-11-20 DIAGNOSIS — N3281 Overactive bladder: Secondary | ICD-10-CM | POA: Insufficient documentation

## 2017-11-20 DIAGNOSIS — E782 Mixed hyperlipidemia: Secondary | ICD-10-CM

## 2017-11-20 LAB — POCT GLYCOSYLATED HEMOGLOBIN (HGB A1C): HEMOGLOBIN A1C: 7.8 % — AB (ref 4.0–5.6)

## 2017-11-20 LAB — GLUCOSE, POCT (MANUAL RESULT ENTRY): POC Glucose: 105 mg/dl — AB (ref 70–99)

## 2017-11-20 MED ORDER — ATORVASTATIN CALCIUM 20 MG PO TABS: 20.0000 mg | ORAL_TABLET | Freq: Every day | ORAL | 3 refills | Status: DC

## 2017-11-20 MED ORDER — CETIRIZINE HCL 10 MG PO TABS: 10.0000 mg | ORAL_TABLET | Freq: Every day | ORAL | 1 refills | Status: DC

## 2017-11-20 MED ORDER — HYDROCHLOROTHIAZIDE 25 MG PO TABS: 25.0000 mg | ORAL_TABLET | Freq: Every day | ORAL | 3 refills | Status: DC

## 2017-11-20 MED ORDER — SITAGLIPTIN PHOSPHATE 50 MG PO TABS: 50.0000 mg | ORAL_TABLET | Freq: Every day | ORAL | 3 refills | Status: DC

## 2017-11-20 MED ORDER — IBUPROFEN 600 MG PO TABS: 600.0000 mg | ORAL_TABLET | Freq: Three times a day (TID) | ORAL | 0 refills | Status: DC | PRN

## 2017-11-20 MED ORDER — METFORMIN HCL 1000 MG PO TABS: 1000.0000 mg | ORAL_TABLET | Freq: Two times a day (BID) | ORAL | 3 refills | Status: DC

## 2017-11-20 MED ORDER — GABAPENTIN 300 MG PO CAPS: 600.0000 mg | ORAL_CAPSULE | Freq: Two times a day (BID) | ORAL | 3 refills | Status: DC

## 2017-11-20 MED ORDER — PREDNISONE 20 MG PO TABS: 20.0000 mg | ORAL_TABLET | Freq: Two times a day (BID) | ORAL | 0 refills | Status: DC

## 2017-11-20 NOTE — Patient Instructions (Signed)

## 2017-11-20 NOTE — Progress Notes (Signed)
Subjective:  Patient ID: Megan Lane, female    DOB: 15-Oct-1955  Age: 62 y.o. MRN: [id number]  CC: Diabetes   HPI Megan Lane  is a 62 year old female with Type 2 Diabetes Mellitus (A1c 7.8), Diabetic neuropathy, Hyperlipidemia, Hypertension here for a follow-up visit. Her A1c is 7.8 which has trended up from 6.8 previously and she endorses eating sweets but has been compliant with her medications.  She denies visual concerns, hypoglycemia and does have neuropathy in her feet which are intermittent and controlled on gabapentin. Of recent she has noticed burning in her right hip which occurs sporadically and is not precipitated by any activity.  Denies hip pain or radiation down her lower extremity. Doing well on her antihypertensive and statin and denies adverse effects from them. She complains of right medial knee pain of 3-day duration and also left big toe pain and is wondering if this is gout.  She denies a previous history of gout.  Pain is described as severe and relieved by taking ibuprofen; it is a 6/10 at this time.  She also has urge incontinence and sometimes has accidents on herself but denies any dysuria, abdominal pain or low back pain.  Past Medical History:  Diagnosis Date  . Diabetes mellitus   . Hypertension     Past Surgical History:  Procedure Laterality Date  . ABDOMINAL HYSTERECTOMY    . KNEE SURGERY      Allergies  Allergen Reactions  . Penicillins Rash     Outpatient Medications Prior to Visit  Medication Sig Dispense Refill  . acetaminophen (TYLENOL) 500 MG tablet Take 2 tablets (1,000 mg total) by mouth every 8 (eight) hours as needed for moderate pain. 30 tablet 0  . glucose blood test strip Use as instructed 100 each 12  . hydrocortisone cream 0.5 % Apply 1 application topically 2 (two) times daily. 30 g 0  . Lancets (ACCU-CHEK SOFT TOUCH) lancets Use as instructed 100 each 12  . Vitamin D, Ergocalciferol, (DRISDOL) 50000 units CAPS  capsule   5  . atorvastatin (LIPITOR) 20 MG tablet Take 1 tablet (20 mg total) by mouth daily. 30 tablet 3  . cetirizine (ZYRTEC) 10 MG tablet Take 1 tablet (10 mg total) by mouth daily. 30 tablet 1  . gabapentin (NEURONTIN) 300 MG capsule Take 2 capsules (600 mg total) by mouth 2 (two) times daily. 120 capsule 3  . hydrochlorothiazide (HYDRODIURIL) 25 MG tablet Take 1 tablet (25 mg total) by mouth daily. 30 tablet 3  . ibuprofen (ADVIL,MOTRIN) 600 MG tablet Take 1 tablet (600 mg total) by mouth every 8 (eight) hours as needed. 30 tablet 0  . meloxicam (MOBIC) 15 MG tablet Take 15 mg by mouth daily.    . metFORMIN (GLUCOPHAGE) 1000 MG tablet Take 1 tablet (1,000 mg total) by mouth 2 (two) times daily with a meal. 60 tablet 3  . sitaGLIPtin (JANUVIA) 25 MG tablet Take 1 tablet (25 mg total) by mouth daily. 30 tablet 3  . metroNIDAZOLE (METROGEL VAGINAL) 0.75 % vaginal gel Place 1 Applicatorful vaginally at bedtime. (Patient not taking: Reported on 11/20/2017) 70 g 0   No facility-administered medications prior to visit.     ROS Review of Systems  Constitutional: Negative for activity change, appetite change and fatigue.  HENT: Negative for congestion, sinus pressure and sore throat.   Eyes: Negative for visual disturbance.  Respiratory: Negative for cough, chest tightness, shortness of breath and wheezing.   Cardiovascular: Negative  for chest pain and palpitations.  Gastrointestinal: Negative for abdominal distention, abdominal pain and constipation.  Endocrine: Negative for polydipsia.  Genitourinary: Negative for dysuria and frequency.  Musculoskeletal:       See hpi  Skin: Negative for rash.  Neurological: Negative for tremors, light-headedness and numbness.  Hematological: Does not bruise/bleed easily.  Psychiatric/Behavioral: Negative for agitation and behavioral problems.    Objective:  BP (!) 150/82   Pulse 77   Temp 97.8 F (36.6 C) (Oral)   Ht 6' (1.829 m)   Wt 238 lb  (108 kg)   SpO2 98%   BMI 32.28 kg/m   BP/Weight 11/20/2017 08/20/2017 03/28/2540  Systolic BP 706 237 628  Diastolic BP 82 70 83  Wt. (Lbs) 238 236 -  BMI 32.28 32.01 -      Physical Exam  Constitutional: She is oriented to person, place, and time. She appears well-developed and well-nourished.  Cardiovascular: Normal rate, normal heart sounds and intact distal pulses.  No murmur heard. Pulmonary/Chest: Effort normal and breath sounds normal. She has no wheezes. She has no rales. She exhibits no tenderness.  Abdominal: Soft. Bowel sounds are normal. She exhibits no distension and no mass. There is no tenderness.  Musculoskeletal:  Slight edema of medial aspect of left big toe, TTP of toe on ROM TTP of medial aspect of R knee, no edema. TTP on ROM.  Neurological: She is alert and oriented to person, place, and time.  Skin: Skin is warm and dry.  Psychiatric: She has a normal mood and affect.    CMP Latest Ref Rng & Units 07/26/2017 05/22/2017 02/12/2017  Glucose 65 - 99 mg/dL 186(H) 131(H) 122(H)  BUN 6 - 20 mg/dL 10 14 12   Creatinine 0.44 - 1.00 mg/dL 0.84 0.90 0.85  Sodium 135 - 145 mmol/L 140 144 143  Potassium 3.5 - 5.1 mmol/L 4.1 4.3 4.2  Chloride 101 - 111 mmol/L 102 107(H) 105  CO2 22 - 32 mmol/L 26 22 21   Calcium 8.9 - 10.3 mg/dL 9.4 9.3 9.7  Total Protein 6.0 - 8.5 g/dL - - 7.3  Total Bilirubin 0.0 - 1.2 mg/dL - - 0.3  Alkaline Phos 39 - 117 IU/L - - 110  AST 0 - 40 IU/L - - 19  ALT 0 - 32 IU/L - - 28    Lipid Panel     Component Value Date/Time   CHOL 198 02/12/2017 1518   TRIG 203 (H) 02/12/2017 1518   HDL 53 02/12/2017 1518   CHOLHDL 3.7 02/12/2017 1518   CHOLHDL 4.3 10/16/2013 1019   VLDL 23 10/16/2013 1019   LDLCALC 104 (H) 02/12/2017 1518    Lab Results  Component Value Date   HGBA1C 7.8 (A) 11/20/2017    Assessment & Plan:   1. Diabetes mellitus due to underlying condition without complication, unspecified whether long term insulin use  (HCC) A1c of 7.8 which has trended up from 6.8 previously Goal is less than 7.0 Increased dose of Januvia Counseled on Diabetic diet, my plate method, 315 minutes of moderate intensity exercise/week Keep blood sugar logs with fasting goals of 80-120 mg/dl, random of less than 180 and in the event of sugars less than 60 mg/dl or greater than 400 mg/dl please notify the clinic ASAP. It is recommended that you undergo annual eye exams and annual foot exams. Pneumonia vaccine is recommended. - POCT glucose (manual entry) - POCT glycosylated hemoglobin (Hb A1C) - Microalbumin/Creatinine Ratio, Urine  2. Type 2 diabetes mellitus  with diabetic neuropathy, without long-term current use of insulin (HCC) - sitaGLIPtin (JANUVIA) 50 MG tablet; Take 1 tablet (50 mg total) by mouth daily.  Dispense: 30 tablet; Refill: 3 - gabapentin (NEURONTIN) 300 MG capsule; Take 2 capsules (600 mg total) by mouth 2 (two) times daily.  Dispense: 120 capsule; Refill: 3 - metFORMIN (GLUCOPHAGE) 1000 MG tablet; Take 1 tablet (1,000 mg total) by mouth 2 (two) times daily with a meal.  Dispense: 60 tablet; Refill: 3  3. Seasonal allergies Currently has some nasal congestion and has been out of her Zyrtec which have refilled - cetirizine (ZYRTEC) 10 MG tablet; Take 1 tablet (10 mg total) by mouth daily.  Dispense: 30 tablet; Refill: 1  4. Mixed hyperlipidemia LDL of 104 which is above goal of less than 100 Low-cholesterol diet - atorvastatin (LIPITOR) 20 MG tablet; Take 1 tablet (20 mg total) by mouth daily.  Dispense: 30 tablet; Refill: 3  5. Essential hypertension, benign Elevated blood pressure but blood pressure was normal at her last office visit No regimen change today Counseled on blood pressure goal of less than 130/80, low-sodium, DASH diet, medication compliance, 150 minutes of moderate intensity exercise per week. Discussed medication compliance, adverse effects. - hydrochlorothiazide (HYDRODIURIL) 25 MG  tablet; Take 1 tablet (25 mg total) by mouth daily.  Dispense: 30 tablet; Refill: 3  6. Acute pain of right knee We will need to exclude gout - Uric Acid - predniSONE (DELTASONE) 20 MG tablet; Take 1 tablet (20 mg total) by mouth 2 (two) times daily with a meal.  Dispense: 10 tablet; Refill: 0 - ibuprofen (ADVIL,MOTRIN) 600 MG tablet; Take 1 tablet (600 mg total) by mouth every 8 (eight) hours as needed.  Dispense: 60 tablet; Refill: 0  7. Pain of toe of left foot We will need to exclude gout - Uric Acid - predniSONE (DELTASONE) 20 MG tablet; Take 1 tablet (20 mg total) by mouth 2 (two) times daily with a meal.  Dispense: 10 tablet; Refill: 0  8. Overactive bladder Discussed Kegel exercises She is not interested in commencing oxybutynin due to side effects of the medication at this time.   Meds ordered this encounter  Medications  . sitaGLIPtin (JANUVIA) 50 MG tablet    Sig: Take 1 tablet (50 mg total) by mouth daily.    Dispense:  30 tablet    Refill:  3    Please discontinue previous dose  . cetirizine (ZYRTEC) 10 MG tablet    Sig: Take 1 tablet (10 mg total) by mouth daily.    Dispense:  30 tablet    Refill:  1  . atorvastatin (LIPITOR) 20 MG tablet    Sig: Take 1 tablet (20 mg total) by mouth daily.    Dispense:  30 tablet    Refill:  3  . gabapentin (NEURONTIN) 300 MG capsule    Sig: Take 2 capsules (600 mg total) by mouth 2 (two) times daily.    Dispense:  120 capsule    Refill:  3  . hydrochlorothiazide (HYDRODIURIL) 25 MG tablet    Sig: Take 1 tablet (25 mg total) by mouth daily.    Dispense:  30 tablet    Refill:  3  . metFORMIN (GLUCOPHAGE) 1000 MG tablet    Sig: Take 1 tablet (1,000 mg total) by mouth 2 (two) times daily with a meal.    Dispense:  60 tablet    Refill:  3  . predniSONE (DELTASONE) 20 MG tablet  Sig: Take 1 tablet (20 mg total) by mouth 2 (two) times daily with a meal.    Dispense:  10 tablet    Refill:  0  . ibuprofen (ADVIL,MOTRIN) 600 MG  tablet    Sig: Take 1 tablet (600 mg total) by mouth every 8 (eight) hours as needed.    Dispense:  60 tablet    Refill:  0    Follow-up: Return in about 3 months (around 02/20/2018) for follow up of chronic medical conditions.   [name] MD

## 2017-11-20 NOTE — Progress Notes (Signed)
Pain in right knee and left big toe.

## 2017-11-21 LAB — URIC ACID: Uric Acid: 6.6 mg/dL (ref 2.5–7.1)

## 2017-11-23 ENCOUNTER — Telehealth: Payer: Self-pay

## 2017-11-23 NOTE — Telephone Encounter (Signed)
Error

## 2017-11-23 NOTE — Telephone Encounter (Signed)
-----   Message from Ladell Pier, MD sent at 11/21/2017 12:29 PM EDT ----- Let pt know that the uric acid level was okay.  However, she can still have gout with nl uric acid level.

## 2017-11-23 NOTE — Telephone Encounter (Addendum)
Patient returned call.  CMA spoke to patient and inform on lab results.   Pt. Understood.  Pt. Wanted an update on her Handicap Placard that was done on her visit on 11/20/2017.

## 2017-11-23 NOTE — Telephone Encounter (Signed)
Patient was called and informed to contact office for lab results.    If patient returns phone call please inform patient of lab results below. 

## 2017-11-26 ENCOUNTER — Telehealth: Payer: Self-pay

## 2017-11-26 NOTE — Telephone Encounter (Signed)
Pt informed of the result of her uric acid level and informed that Dr Wynetta Emery wanted her to know that she can still get gout.  She verbalized understanding.

## 2018-02-01 ENCOUNTER — Telehealth: Payer: Self-pay | Admitting: Family Medicine

## 2018-02-01 NOTE — Telephone Encounter (Signed)
The patient has refills left with [facility], if she wants them filled @ [place] she should call [facility] to initiate a transfer.

## 2018-02-01 NOTE — Telephone Encounter (Signed)
1) Medication(s) Requested (by name): [place] 2) Pharmacy of Choice:  [facility]

## 2018-02-11 ENCOUNTER — Ambulatory Visit (INDEPENDENT_AMBULATORY_CARE_PROVIDER_SITE_OTHER): Payer: BLUE CROSS/BLUE SHIELD | Admitting: Podiatry

## 2018-02-11 DIAGNOSIS — M79674 Pain in right toe(s): Secondary | ICD-10-CM | POA: Diagnosis not present

## 2018-02-11 DIAGNOSIS — B351 Tinea unguium: Secondary | ICD-10-CM | POA: Diagnosis not present

## 2018-02-11 DIAGNOSIS — B353 Tinea pedis: Secondary | ICD-10-CM

## 2018-02-11 DIAGNOSIS — M79675 Pain in left toe(s): Secondary | ICD-10-CM

## 2018-02-11 MED ORDER — KETOCONAZOLE 2 % EX CREA: 1.0000 "application " | TOPICAL_CREAM | Freq: Every day | CUTANEOUS | 1 refills | Status: DC

## 2018-02-11 NOTE — Patient Instructions (Signed)
Diabetes Mellitus and Foot Care  Foot care is an important part of your health, especially when you have diabetes. Diabetes may cause you to have problems because of poor blood flow (circulation) to your feet and legs, which can cause your skin to:   Become thinner and drier.   Break more easily.   Heal more slowly.   Peel and crack.  You may also have nerve damage (neuropathy) in your legs and feet, causing decreased feeling in them. This means that you may not notice minor injuries to your feet that could lead to more serious problems. Noticing and addressing any potential problems early is the best way to prevent future foot problems.  How to care for your feet  Foot hygiene   Wash your feet daily with warm water and mild soap. Do not use hot water. Then, pat your feet and the areas between your toes until they are completely dry. Do not soak your feet as this can dry your skin.   Trim your toenails straight across. Do not dig under them or around the cuticle. File the edges of your nails with an emery board or nail file.   Apply a moisturizing lotion or petroleum jelly to the skin on your feet and to dry, brittle toenails. Use lotion that does not contain alcohol and is unscented. Do not apply lotion between your toes.  Shoes and socks   Wear clean socks or stockings every day. Make sure they are not too tight. Do not wear knee-high stockings since they may decrease blood flow to your legs.   Wear shoes that fit properly and have enough cushioning. Always look in your shoes before you put them on to be sure there are no objects inside.   To break in new shoes, wear them for just a few hours a day. This prevents injuries on your feet.  Wounds, scrapes, corns, and calluses   Check your feet daily for blisters, cuts, bruises, sores, and redness. If you cannot see the bottom of your feet, use a mirror or ask someone for help.   Do not cut corns or calluses or try to remove them with medicine.   If you  find a minor scrape, cut, or break in the skin on your feet, keep it and the skin around it clean and dry. You may clean these areas with mild soap and water. Do not clean the area with peroxide, alcohol, or iodine.   If you have a wound, scrape, corn, or callus on your foot, look at it several times a day to make sure it is healing and not infected. Check for:  ? Redness, swelling, or pain.  ? Fluid or blood.  ? Warmth.  ? Pus or a bad smell.  General instructions   Do not cross your legs. This may decrease blood flow to your feet.   Do not use heating pads or hot water bottles on your feet. They may burn your skin. If you have lost feeling in your feet or legs, you may not know this is happening until it is too late.   Protect your feet from hot and cold by wearing shoes, such as at the beach or on hot pavement.   Schedule a complete foot exam at least once a year (annually) or more often if you have foot problems. If you have foot problems, report any cuts, sores, or bruises to your health care provider immediately.  Contact a health care provider if:     You have a medical condition that increases your risk of infection and you have any cuts, sores, or bruises on your feet.   You have an injury that is not healing.   You have redness on your legs or feet.   You feel burning or tingling in your legs or feet.   You have pain or cramps in your legs and feet.   Your legs or feet are numb.   Your feet always feel cold.   You have pain around a toenail.  Get help right away if:   You have a wound, scrape, corn, or callus on your foot and:  ? You have pain, swelling, or redness that gets worse.  ? You have fluid or blood coming from the wound, scrape, corn, or callus.  ? Your wound, scrape, corn, or callus feels warm to the touch.  ? You have pus or a bad smell coming from the wound, scrape, corn, or callus.  ? You have a fever.  ? You have a red line going up your leg.  Summary   Check your feet every day  for cuts, sores, red spots, swelling, and blisters.   Moisturize feet and legs daily.   Wear shoes that fit properly and have enough cushioning.   If you have foot problems, report any cuts, sores, or bruises to your health care provider immediately.   Schedule a complete foot exam at least once a year (annually) or more often if you have foot problems.  This information is not intended to replace advice given to you by your health care provider. Make sure you discuss any questions you have with your health care provider.  Document Released: 01/21/2000 Document Revised: 03/07/2017 Document Reviewed: 02/25/2016  Elsevier Interactive Patient Education  2019 [employer]

## 2018-02-21 ENCOUNTER — Ambulatory Visit: Payer: BLUE CROSS/BLUE SHIELD | Admitting: Family Medicine

## 2018-02-25 ENCOUNTER — Ambulatory Visit: Payer: BLUE CROSS/BLUE SHIELD | Attending: Family Medicine | Admitting: Family Medicine

## 2018-02-25 ENCOUNTER — Encounter: Payer: Self-pay | Admitting: Family Medicine

## 2018-02-25 VITALS — BP 155/88 | HR 81 | Temp 98.1°F | Ht 72.0 in | Wt 238.2 lb

## 2018-02-25 DIAGNOSIS — E1140 Type 2 diabetes mellitus with diabetic neuropathy, unspecified: Secondary | ICD-10-CM | POA: Diagnosis not present

## 2018-02-25 DIAGNOSIS — G8929 Other chronic pain: Secondary | ICD-10-CM | POA: Diagnosis not present

## 2018-02-25 DIAGNOSIS — I10 Essential (primary) hypertension: Secondary | ICD-10-CM

## 2018-02-25 DIAGNOSIS — B309 Viral conjunctivitis, unspecified: Secondary | ICD-10-CM | POA: Diagnosis not present

## 2018-02-25 DIAGNOSIS — M25561 Pain in right knee: Secondary | ICD-10-CM | POA: Diagnosis not present

## 2018-02-25 DIAGNOSIS — E089 Diabetes mellitus due to underlying condition without complications: Secondary | ICD-10-CM | POA: Diagnosis not present

## 2018-02-25 DIAGNOSIS — J018 Other acute sinusitis: Secondary | ICD-10-CM

## 2018-02-25 DIAGNOSIS — E782 Mixed hyperlipidemia: Secondary | ICD-10-CM

## 2018-02-25 LAB — GLUCOSE, POCT (MANUAL RESULT ENTRY): POC GLUCOSE: 140 mg/dL — AB (ref 70–99)

## 2018-02-25 MED ORDER — OLOPATADINE HCL 0.1 % OP SOLN: 1.0000 [drp] | Freq: Two times a day (BID) | OPHTHALMIC | 1 refills | Status: DC

## 2018-02-25 MED ORDER — HYDROCHLOROTHIAZIDE 25 MG PO TABS: 25.0000 mg | ORAL_TABLET | Freq: Every day | ORAL | 6 refills | Status: DC

## 2018-02-25 MED ORDER — GLIPIZIDE 5 MG PO TABS: 5.0000 mg | ORAL_TABLET | Freq: Two times a day (BID) | ORAL | 3 refills | Status: DC

## 2018-02-25 MED ORDER — CETIRIZINE HCL 10 MG PO TABS: 10.0000 mg | ORAL_TABLET | Freq: Every day | ORAL | 1 refills | Status: DC

## 2018-02-25 MED ORDER — PNEUMOCOCCAL VAC POLYVALENT 25 MCG/0.5ML IJ INJ: 0.5000 mL | INJECTION | Freq: Once | INTRAMUSCULAR | 0 refills | Status: AC

## 2018-02-25 MED ORDER — GABAPENTIN 300 MG PO CAPS: 600.0000 mg | ORAL_CAPSULE | Freq: Two times a day (BID) | ORAL | 6 refills | Status: DC

## 2018-02-25 MED ORDER — METFORMIN HCL 1000 MG PO TABS: 1000.0000 mg | ORAL_TABLET | Freq: Two times a day (BID) | ORAL | 6 refills | Status: DC

## 2018-02-25 MED ORDER — ATORVASTATIN CALCIUM 20 MG PO TABS: 20.0000 mg | ORAL_TABLET | Freq: Every day | ORAL | 6 refills | Status: DC

## 2018-02-25 MED ORDER — PNEUMOCOCCAL VAC POLYVALENT 25 MCG/0.5ML IJ INJ: 0.5000 mL | INJECTION | Freq: Once | INTRAMUSCULAR | 0 refills | Status: DC

## 2018-02-25 MED ORDER — IBUPROFEN 600 MG PO TABS: 600.0000 mg | ORAL_TABLET | Freq: Three times a day (TID) | ORAL | 2 refills | Status: DC | PRN

## 2018-02-25 NOTE — Progress Notes (Signed)
Subjective:  Patient ID: Megan Lane, female    DOB: 05/05/1955  Age: 63 y.o. MRN: [id number]  CC: Diabetes and Eye Pain   HPI Megan Lane   is a 63 year old female with Type 2 Diabetes Mellitus (A1c 7.8), Diabetic neuropathy, Hyperlipidemia, Hypertension here for a follow-up visit. She complains of a tearing from her left eye,feeling like she has "a tear in her left upper eyelid", rhinorrhea from her left nostril and sinus pressure. Tylenol has provided no relief. States she can hardly keep her eyes open but denies visual complains, photophobia, fever, myalgia.  She requests a refill of Ibuprofen which she uses for right knee pain. Her BP is elevated and she endorses compliance with her antihypertensive. She is also compliant with her Diabetic medications and statin. Neuropathy os controlled on Gabapentin and she denies hypoglycemia.  Past Medical History:  Diagnosis Date  . Diabetes mellitus   . Hypertension     Past Surgical History:  Procedure Laterality Date  . ABDOMINAL HYSTERECTOMY    . KNEE SURGERY      Allergies  Allergen Reactions  . Penicillins Rash     Outpatient Medications Prior to Visit  Medication Sig Dispense Refill  . acetaminophen (TYLENOL) 500 MG tablet Take 2 tablets (1,000 mg total) by mouth every 8 (eight) hours as needed for moderate pain. 30 tablet 0  . glucose blood test strip Use as instructed 100 each 12  . hydrocortisone cream 0.5 % Apply 1 application topically 2 (two) times daily. 30 g 0  . Lancets (ACCU-CHEK SOFT TOUCH) lancets Use as instructed 100 each 12  . Vitamin D, Ergocalciferol, (DRISDOL) 50000 units CAPS capsule   5  . atorvastatin (LIPITOR) 20 MG tablet Take 1 tablet (20 mg total) by mouth daily. 30 tablet 3  . cetirizine (ZYRTEC) 10 MG tablet Take 1 tablet (10 mg total) by mouth daily. 30 tablet 1  . gabapentin (NEURONTIN) 300 MG capsule Take 2 capsules (600 mg total) by mouth 2 (two) times daily. 120 capsule 3  .  hydrochlorothiazide (HYDRODIURIL) 25 MG tablet Take 1 tablet (25 mg total) by mouth daily. 30 tablet 3  . ibuprofen (ADVIL,MOTRIN) 600 MG tablet Take 1 tablet (600 mg total) by mouth every 8 (eight) hours as needed. 60 tablet 0  . metFORMIN (GLUCOPHAGE) 1000 MG tablet Take 1 tablet (1,000 mg total) by mouth 2 (two) times daily with a meal. 60 tablet 3  . sitaGLIPtin (JANUVIA) 50 MG tablet Take 1 tablet (50 mg total) by mouth daily. 30 tablet 3  . ketoconazole (NIZORAL) 2 % cream Apply 1 application topically daily. Apply to both feet and between toes once daily for 6 weeks (Patient not taking: Reported on 02/25/2018) 30 g 1  . metroNIDAZOLE (METROGEL VAGINAL) 0.75 % vaginal gel Place 1 Applicatorful vaginally at bedtime. (Patient not taking: Reported on 02/25/2018) 70 g 0  . predniSONE (DELTASONE) 20 MG tablet Take 1 tablet (20 mg total) by mouth 2 (two) times daily with a meal. (Patient not taking: Reported on 02/25/2018) 10 tablet 0   No facility-administered medications prior to visit.     ROS Review of Systems  Constitutional: Negative for activity change, appetite change and fatigue.  HENT: Positive for rhinorrhea. Negative for congestion, sinus pressure and sore throat.   Eyes: Positive for pain and discharge. Negative for visual disturbance.  Respiratory: Negative for cough, chest tightness, shortness of breath and wheezing.   Cardiovascular: Negative for chest pain and palpitations.  Gastrointestinal: Negative for abdominal distention, abdominal pain and constipation.  Endocrine: Negative for polydipsia.  Genitourinary: Negative for dysuria and frequency.  Musculoskeletal: Negative for arthralgias and back pain.  Skin: Negative for rash.  Neurological: Negative for tremors, light-headedness and numbness.  Hematological: Does not bruise/bleed easily.  Psychiatric/Behavioral: Negative for agitation and behavioral problems.    Objective:  BP (!) 155/88   Pulse 81   Temp 98.1 F (36.7  C) (Oral)   Ht 6' (1.829 m)   Wt 238 lb 3.2 oz (108 kg)   SpO2 97%   BMI 32.31 kg/m   BP/Weight 02/25/2018 11/20/2017 5/46/2703  Systolic BP 500 938 182  Diastolic BP 88 82 70  Wt. (Lbs) 238.2 238 236  BMI 32.31 32.28 32.01     Physical Exam Constitutional:      Appearance: She is well-developed.  Eyes:     Comments: Tearing from left eye with slight superior eyelid edema Tenderness of left ethmoidal sinus  Cardiovascular:     Rate and Rhythm: Normal rate.     Heart sounds: Normal heart sounds. No murmur.  Pulmonary:     Effort: Pulmonary effort is normal.     Breath sounds: Normal breath sounds. No wheezing or rales.  Chest:     Chest wall: No tenderness.  Abdominal:     General: Bowel sounds are normal. There is no distension.     Palpations: Abdomen is soft. There is no mass.     Tenderness: There is no abdominal tenderness.  Musculoskeletal: Normal range of motion.  Neurological:     Mental Status: She is alert and oriented to person, place, and time.  Psychiatric:        Mood and Affect: Mood normal.        Behavior: Behavior normal.     CMP Latest Ref Rng & Units 07/26/2017 05/22/2017 02/12/2017  Glucose 65 - 99 mg/dL 186(H) 131(H) 122(H)  BUN 6 - 20 mg/dL 10 14 12   Creatinine 0.44 - 1.00 mg/dL 0.84 0.90 0.85  Sodium 135 - 145 mmol/L 140 144 143  Potassium 3.5 - 5.1 mmol/L 4.1 4.3 4.2  Chloride 101 - 111 mmol/L 102 107(H) 105  CO2 22 - 32 mmol/L 26 22 21   Calcium 8.9 - 10.3 mg/dL 9.4 9.3 9.7  Total Protein 6.0 - 8.5 g/dL - - 7.3  Total Bilirubin 0.0 - 1.2 mg/dL - - 0.3  Alkaline Phos 39 - 117 IU/L - - 110  AST 0 - 40 IU/L - - 19  ALT 0 - 32 IU/L - - 28    Lipid Panel     Component Value Date/Time   CHOL 198 02/12/2017 1518   TRIG 203 (H) 02/12/2017 1518   HDL 53 02/12/2017 1518   CHOLHDL 3.7 02/12/2017 1518   CHOLHDL 4.3 10/16/2013 1019   VLDL 23 10/16/2013 1019   LDLCALC 104 (H) 02/12/2017 1518    Lab Results  Component Value Date   HGBA1C  7.8 (A) 11/20/2017     Assessment & Plan:   1. Diabetes mellitus due to underlying condition without complication, without long-term current use of insulin (HCC) Last A1c was 7.8 Will send off A1c today and adjust regimen accordingly Counseled on Diabetic diet, my plate method, 993 minutes of moderate intensity exercise/week Keep blood sugar logs with fasting goals of 80-120 mg/dl, random of less than 180 and in the event of sugars less than 60 mg/dl or greater than 400 mg/dl please notify the clinic ASAP. It  is recommended that you undergo annual eye exams and annual foot exams. Pneumonia vaccine is recommended. - POCT glucose (manual entry) - Hemoglobin A1c - glipiZIDE (GLUCOTROL) 5 MG tablet; Take 1 tablet (5 mg total) by mouth 2 (two) times daily before a meal.  Dispense: 60 tablet; Refill: 3  2. Chronic pain of right knee Stable - ibuprofen (ADVIL,MOTRIN) 600 MG tablet; Take 1 tablet (600 mg total) by mouth every 8 (eight) hours as needed.  Dispense: 60 tablet; Refill: 2  3. Mixed hyperlipidemia LDL of 104 is slightly above goal of <100 Lifestyle modifications,low cholesteroldiet - atorvastatin (LIPITOR) 20 MG tablet; Take 1 tablet (20 mg total) by mouth daily.  Dispense: 30 tablet; Refill: 6  4. Acute non-recurrent sinusitis of other sinus With eye symptoms Notify the clinic of visual changes or worsening symptoms - cetirizine (ZYRTEC) 10 MG tablet; Take 1 tablet (10 mg total) by mouth daily.  Dispense: 30 tablet; Refill: 1  5. Type 2 diabetes mellitus with diabetic neuropathy, without long-term current use of insulin (HCC) See #1 above - gabapentin (NEURONTIN) 300 MG capsule; Take 2 capsules (600 mg total) by mouth 2 (two) times daily.  Dispense: 120 capsule; Refill: 6 - metFORMIN (GLUCOPHAGE) 1000 MG tablet; Take 1 tablet (1,000 mg total) by mouth 2 (two) times daily with a meal.  Dispense: 60 tablet; Refill: 6  6. Essential hypertension, benign Uncontrolled Will hold  off on making changes as elevation could be due to acute illness Counseled on blood pressure goal of less than 130/80, low-sodium, DASH diet, medication compliance, 150 minutes of moderate intensity exercise per week. Discussed medication compliance, adverse effects. - hydrochlorothiazide (HYDRODIURIL) 25 MG tablet; Take 1 tablet (25 mg total) by mouth daily.  Dispense: 30 tablet; Refill: 6  7. Viral conjunctivitis of left eye - olopatadine (PATANOL) 0.1 % ophthalmic solution; Place 1 drop into the left eye 2 (two) times daily.  Dispense: 5 mL; Refill: 1   Meds ordered this encounter  Medications  . ibuprofen (ADVIL,MOTRIN) 600 MG tablet    Sig: Take 1 tablet (600 mg total) by mouth every 8 (eight) hours as needed.    Dispense:  60 tablet    Refill:  2  . glipiZIDE (GLUCOTROL) 5 MG tablet    Sig: Take 1 tablet (5 mg total) by mouth 2 (two) times daily before a meal.    Dispense:  60 tablet    Refill:  3  . olopatadine (PATANOL) 0.1 % ophthalmic solution    Sig: Place 1 drop into the left eye 2 (two) times daily.    Dispense:  5 mL    Refill:  1  . atorvastatin (LIPITOR) 20 MG tablet    Sig: Take 1 tablet (20 mg total) by mouth daily.    Dispense:  30 tablet    Refill:  6  . cetirizine (ZYRTEC) 10 MG tablet    Sig: Take 1 tablet (10 mg total) by mouth daily.    Dispense:  30 tablet    Refill:  1  . gabapentin (NEURONTIN) 300 MG capsule    Sig: Take 2 capsules (600 mg total) by mouth 2 (two) times daily.    Dispense:  120 capsule    Refill:  6  . hydrochlorothiazide (HYDRODIURIL) 25 MG tablet    Sig: Take 1 tablet (25 mg total) by mouth daily.    Dispense:  30 tablet    Refill:  6  . metFORMIN (GLUCOPHAGE) 1000 MG tablet    Sig:  Take 1 tablet (1,000 mg total) by mouth 2 (two) times daily with a meal.    Dispense:  60 tablet    Refill:  6    Follow-up: Return in about 3 months (around 05/27/2018) for Follow-up of chronic medical conditions.   [name] MD

## 2018-02-26 ENCOUNTER — Other Ambulatory Visit: Payer: Self-pay | Admitting: Family Medicine

## 2018-02-26 ENCOUNTER — Encounter: Payer: Self-pay | Admitting: Family Medicine

## 2018-02-26 ENCOUNTER — Other Ambulatory Visit: Payer: Self-pay

## 2018-02-26 DIAGNOSIS — E089 Diabetes mellitus due to underlying condition without complications: Secondary | ICD-10-CM

## 2018-02-26 LAB — HEMOGLOBIN A1C
ESTIMATED AVERAGE GLUCOSE: 197 mg/dL
Hgb A1c MFr Bld: 8.5 % — ABNORMAL HIGH (ref 4.8–5.6)

## 2018-02-26 MED ORDER — GLIPIZIDE 10 MG PO TABS: 10.0000 mg | ORAL_TABLET | Freq: Two times a day (BID) | ORAL | 3 refills | Status: DC

## 2018-02-26 MED ORDER — PNEUMOCOCCAL VAC POLYVALENT 25 MCG/0.5ML IJ INJ: 0.5000 mL | INJECTION | INTRAMUSCULAR | 0 refills | Status: DC

## 2018-03-01 ENCOUNTER — Telehealth: Payer: Self-pay

## 2018-03-01 NOTE — Telephone Encounter (Signed)
-----   Message from [name], MD sent at 02/26/2018  6:33 PM EST ----- A1c has trended up to 8.3.I have raised Glipizide dose to 10mg . Please advise to comply with a diabetic diet

## 2018-03-01 NOTE — Telephone Encounter (Signed)
Patient was called and a voicemail was left informing patient to return phone call for lab results. 

## 2018-03-01 NOTE — Telephone Encounter (Signed)
Patient called back to get their lab results. Please follow up with patient.

## 2018-03-04 ENCOUNTER — Encounter: Payer: Self-pay | Admitting: Podiatry

## 2018-03-04 NOTE — Progress Notes (Signed)
Subjective: Megan Lane presents today with painful, thick toenails 1-5 b/l that she cannot cut and which interfere with daily activities.  Pain is aggravated when wearing enclosed shoe gear.  She voices no new problems on today's visit.  [name], MD is her PCP.    Current Outpatient Medications:  .  acetaminophen (TYLENOL) 500 MG tablet, Take 2 tablets (1,000 mg total) by mouth every 8 (eight) hours as needed for moderate pain., Disp: 30 tablet, Rfl: 0 .  glucose blood test strip, Use as instructed, Disp: 100 each, Rfl: 12 .  hydrocortisone cream 0.5 %, Apply 1 application topically 2 (two) times daily., Disp: 30 g, Rfl: 0 .  Lancets (ACCU-CHEK SOFT TOUCH) lancets, Use as instructed, Disp: 100 each, Rfl: 12 .  metroNIDAZOLE (METROGEL VAGINAL) 0.75 % vaginal gel, Place 1 Applicatorful vaginally at bedtime. (Patient not taking: Reported on 02/25/2018), Disp: 70 g, Rfl: 0 .  predniSONE (DELTASONE) 20 MG tablet, Take 1 tablet (20 mg total) by mouth 2 (two) times daily with a meal. (Patient not taking: Reported on 02/25/2018), Disp: 10 tablet, Rfl: 0 .  Vitamin D, Ergocalciferol, (DRISDOL) 50000 units CAPS capsule, , Disp: , Rfl: 5 .  atorvastatin (LIPITOR) 20 MG tablet, Take 1 tablet (20 mg total) by mouth daily., Disp: 30 tablet, Rfl: 6 .  cetirizine (ZYRTEC) 10 MG tablet, Take 1 tablet (10 mg total) by mouth daily., Disp: 30 tablet, Rfl: 1 .  gabapentin (NEURONTIN) 300 MG capsule, Take 2 capsules (600 mg total) by mouth 2 (two) times daily., Disp: 120 capsule, Rfl: 6 .  glipiZIDE (GLUCOTROL) 10 MG tablet, Take 1 tablet (10 mg total) by mouth 2 (two) times daily before a meal., Disp: 60 tablet, Rfl: 3 .  hydrochlorothiazide (HYDRODIURIL) 25 MG tablet, Take 1 tablet (25 mg total) by mouth daily., Disp: 30 tablet, Rfl: 6 .  ibuprofen (ADVIL,MOTRIN) 600 MG tablet, Take 1 tablet (600 mg total) by mouth every 8 (eight) hours as needed., Disp: 60 tablet, Rfl: 2 .  ketoconazole (NIZORAL) 2 %  cream, Apply 1 application topically daily. Apply to both feet and between toes once daily for 6 weeks (Patient not taking: Reported on 02/25/2018), Disp: 30 g, Rfl: 1 .  metFORMIN (GLUCOPHAGE) 1000 MG tablet, Take 1 tablet (1,000 mg total) by mouth 2 (two) times daily with a meal., Disp: 60 tablet, Rfl: 6 .  olopatadine (PATANOL) 0.1 % ophthalmic solution, Place 1 drop into the left eye 2 (two) times daily., Disp: 5 mL, Rfl: 1  Allergies  Allergen Reactions  . Penicillins Rash    Objective:  Vascular Examination: Capillary refill time immediate x 10 digits  Dorsalis pedis and Posterior tibial pulses palpable b/l  Digital hair present x 10 digits  Skin temperature gradient WNL b/l  Dermatological Examination: Skin with normal turgor, texture and tone b/l  Toenails 1-5 b/l discolored, thick, dystrophic with subungual debris and pain with palpation to nailbeds due to thickness of nails.  Diffuse scaling noted peripherally and plantarly b/l feet with mild foot odor.  No interdigital macerations.  No blisters, no weeping. No signs of secondary bacterial infection noted.  Musculoskeletal: Muscle strength 5/5 to all LE muscle groups  No gross bony deformities b/l.  No pain, crepitus or joint limitation noted with ROM.   Neurological: Sensation decreased  with 10 gram monofilament.   Assessment: Painful onychomycosis toenails 1-5 b/l  Tinea pedis b/l  Plan: 1. Continue diabetic foot care principles.  2. Toenails 1-5 b/l were  debrided in length and girth without iatrogenic bleeding. For tinea pedis, prescription sent to pharmacy for Ketoconazole Cream 2% to be applied to both feet and between toes qd x 6 weeks. 3. Patient to continue soft, supportive shoe gear 4. Patient to report any pedal injuries to medical professional immediately. 5. Follow up 3 months. Patient/POA to call should there be a concern in the interim.

## 2018-03-06 ENCOUNTER — Telehealth: Payer: Self-pay | Admitting: Family Medicine

## 2018-03-06 NOTE — Telephone Encounter (Signed)
Please call patient back with lab results when possible

## 2018-03-07 NOTE — Telephone Encounter (Signed)
Patient was called and informed of lab results and medication change. 

## 2018-03-07 NOTE — Telephone Encounter (Signed)
-----   Message from [name], MD sent at 02/26/2018  6:33 PM EST ----- A1c has trended up to 8.3.I have raised Glipizide dose to 10mg . Please advise to comply with a diabetic diet

## 2018-05-13 ENCOUNTER — Encounter: Payer: Self-pay | Admitting: Podiatry

## 2018-05-13 ENCOUNTER — Ambulatory Visit (INDEPENDENT_AMBULATORY_CARE_PROVIDER_SITE_OTHER): Payer: BLUE CROSS/BLUE SHIELD | Admitting: Podiatry

## 2018-05-13 ENCOUNTER — Other Ambulatory Visit: Payer: Self-pay

## 2018-05-13 VITALS — Temp 96.1°F

## 2018-05-13 DIAGNOSIS — B351 Tinea unguium: Secondary | ICD-10-CM | POA: Diagnosis not present

## 2018-05-13 DIAGNOSIS — L84 Corns and callosities: Secondary | ICD-10-CM

## 2018-05-13 DIAGNOSIS — M79674 Pain in right toe(s): Secondary | ICD-10-CM

## 2018-05-13 DIAGNOSIS — M79675 Pain in left toe(s): Secondary | ICD-10-CM

## 2018-05-13 DIAGNOSIS — E1149 Type 2 diabetes mellitus with other diabetic neurological complication: Secondary | ICD-10-CM | POA: Diagnosis not present

## 2018-05-14 NOTE — Progress Notes (Signed)
Subjective: Megan Lane presents for preventative diabetic foot care with diabetes and h/o diabetic neuropathy with painful, mycotic toenails and painful callus which interferes with activities of daily living. Pain is aggravated when wearing enclosed shoe gear. Pain is relieved with periodic professional debridement.  Megan Rakes, MD is her PCP and last visit was 02/25/2018.   Current Outpatient Medications:  .  acetaminophen (TYLENOL) 500 MG tablet, Take 2 tablets (1,000 mg total) by mouth every 8 (eight) hours as needed for moderate pain., Disp: 30 tablet, Rfl: 0 .  atorvastatin (LIPITOR) 20 MG tablet, Take 1 tablet (20 mg total) by mouth daily., Disp: 30 tablet, Rfl: 6 .  cetirizine (ZYRTEC) 10 MG tablet, Take 1 tablet (10 mg total) by mouth daily., Disp: 30 tablet, Rfl: 1 .  gabapentin (NEURONTIN) 300 MG capsule, Take 2 capsules (600 mg total) by mouth 2 (two) times daily., Disp: 120 capsule, Rfl: 6 .  glipiZIDE (GLUCOTROL) 10 MG tablet, Take 1 tablet (10 mg total) by mouth 2 (two) times daily before a meal., Disp: 60 tablet, Rfl: 3 .  glucose blood test strip, Use as instructed, Disp: 100 each, Rfl: 12 .  hydrochlorothiazide (HYDRODIURIL) 25 MG tablet, Take 1 tablet (25 mg total) by mouth daily., Disp: 30 tablet, Rfl: 6 .  hydrocortisone cream 0.5 %, Apply 1 application topically 2 (two) times daily., Disp: 30 g, Rfl: 0 .  ibuprofen (ADVIL,MOTRIN) 600 MG tablet, Take 1 tablet (600 mg total) by mouth every 8 (eight) hours as needed., Disp: 60 tablet, Rfl: 2 .  ketoconazole (NIZORAL) 2 % cream, Apply 1 application topically daily. Apply to both feet and between toes once daily for 6 weeks (Patient not taking: Reported on 02/25/2018), Disp: 30 g, Rfl: 1 .  Lancets (ACCU-CHEK SOFT TOUCH) lancets, Use as instructed, Disp: 100 each, Rfl: 12 .  metFORMIN (GLUCOPHAGE) 1000 MG tablet, Take 1 tablet (1,000 mg total) by mouth 2 (two) times daily with a meal., Disp: 60 tablet, Rfl: 6 .   metroNIDAZOLE (METROGEL VAGINAL) 0.75 % vaginal gel, Place 1 Applicatorful vaginally at bedtime. (Patient not taking: Reported on 02/25/2018), Disp: 70 g, Rfl: 0 .  olopatadine (PATANOL) 0.1 % ophthalmic solution, Place 1 drop into the left eye 2 (two) times daily., Disp: 5 mL, Rfl: 1 .  predniSONE (DELTASONE) 20 MG tablet, Take 1 tablet (20 mg total) by mouth 2 (two) times daily with a meal. (Patient not taking: Reported on 02/25/2018), Disp: 10 tablet, Rfl: 0 .  Vitamin D, Ergocalciferol, (DRISDOL) 50000 units CAPS capsule, , Disp: , Rfl: 5  Allergies  Allergen Reactions  . Penicillins Rash    Vascular Examination: Capillary refill time immediate x  10 digits.  Dorsalis pedis pulses palpable b/l.  Posterior tibial pulses palpable b/l  Digital hair present x 10 digits.  Skin temperature gradient WNL b/l.  Dermatological Examination: Skin with normal turgor, texture and tone b/l.  Toenails 1-5 b/l discolored, thick, dystrophic with subungual debris and pain with palpation to nailbeds due to thickness of nails.  Hyperkeratotic lesions submet heads 1, 4 right and 1, 2, 3, 4 left foot. No erythema, no edema, no drainage, no flocculence noted.   Musculoskeletal: Muscle strength 5/5 to all LE muscle groups  Neurological: Sensation diminished with 10 gram monofilament. Vibratory sensation diminished  Assessment: 1. Painful onychomycosis toenails 1-5 b/l 2. Calluses submet heads 1, 4 right and 1, 2, 3, 4 left foot 3. NIDDM with Diabetic neuropathy  Plan: 1. Continue diabetic foot care  principles. Literature dispensed on today. 2. Toenails 1-5 b/l were debrided in length and girth without iatrogenic bleeding. 3. Calluses pared submetatarsal head(s) heads 1, 4 right and 1, 2, 3, 4 left foot utilizing sterile scalpel blade without incident.  4. Patient to continue soft, supportive shoe gear 5. Patient to report any pedal injuries to medical professional  6. Follow up 3 months.   7. Patient/POA to call should there be a concern in the interim.

## 2018-05-29 ENCOUNTER — Encounter: Payer: Self-pay | Admitting: Family Medicine

## 2018-05-29 ENCOUNTER — Ambulatory Visit: Payer: BLUE CROSS/BLUE SHIELD | Attending: Family Medicine | Admitting: Family Medicine

## 2018-05-29 ENCOUNTER — Other Ambulatory Visit: Payer: Self-pay

## 2018-05-29 DIAGNOSIS — E1169 Type 2 diabetes mellitus with other specified complication: Secondary | ICD-10-CM | POA: Diagnosis not present

## 2018-05-29 DIAGNOSIS — I10 Essential (primary) hypertension: Secondary | ICD-10-CM | POA: Diagnosis not present

## 2018-05-29 DIAGNOSIS — M79605 Pain in left leg: Secondary | ICD-10-CM

## 2018-05-29 DIAGNOSIS — E785 Hyperlipidemia, unspecified: Secondary | ICD-10-CM

## 2018-05-29 MED ORDER — CYCLOBENZAPRINE HCL 10 MG PO TABS: 10.0000 mg | ORAL_TABLET | Freq: Every day | ORAL | 0 refills | Status: DC

## 2018-05-29 NOTE — Progress Notes (Signed)
Patient has been called and DOB has been verified. Patient has been screened and transferred to PCP to start phone visit.  C/C: Diabetes   

## 2018-05-29 NOTE — Progress Notes (Signed)
Virtual Visit via Telephone Note  I connected with Megan Lane, on 05/29/2018 at 3:49 PM by telephone and verified that I am speaking with the correct person using two identifiers.   Consent: I discussed the limitations, risks, security and privacy concerns of performing an evaluation and management service by telephone and the availability of in person appointments. I also discussed with the patient that there may be a patient responsible charge related to this service. The patient expressed understanding and agreed to proceed.   Location of Patient: Work  Biomedical scientist of Provider: Clinic   Persons participating in Telemedicine visit: Adriel M Faythe Heitzenrater Farrington-CMA Dr. Felecia Shelling    History of Present Illness: Megan Lane   is a 63 year old female with Type 2 Diabetes Mellitus (A1c 7.8), Diabetic neuropathy, Hyperlipidemia, Hypertension here for a follow-up visit.  Last A1c from 02/2018 is 8.5 which is up from 7.8 previously and glipizide had been increased from 5 mg twice daily to 10 mg twice daily.  She has been compliant with both glipizide and metformin and has lost 20 pounds by eating right and exercising.  She complains of diarrhea which occurs with metformin.  Most recent Fasting sugar was 118. She also complains of pain on the lateral aspect of her left leg which is shooting and occurs between her left ankle and left knee and is unpredictable.  Frequency is about 2 times per month and she almost gets to the ground when the pain occurs.  She denies numbness but is currently on gabapentin for diabetic neuropathy.  He has been compliant with her antihypertensive and her statin but has not been checking her blood pressures at home. She has no additional concerns today.  Past Medical History:  Diagnosis Date  . Diabetes mellitus   . Hypertension    Allergies  Allergen Reactions  . Penicillins Rash    Current Outpatient Medications on File Prior to Visit   Medication Sig Dispense Refill  . acetaminophen (TYLENOL) 500 MG tablet Take 2 tablets (1,000 mg total) by mouth every 8 (eight) hours as needed for moderate pain. 30 tablet 0  . atorvastatin (LIPITOR) 20 MG tablet Take 1 tablet (20 mg total) by mouth daily. 30 tablet 6  . gabapentin (NEURONTIN) 300 MG capsule Take 2 capsules (600 mg total) by mouth 2 (two) times daily. 120 capsule 6  . glipiZIDE (GLUCOTROL) 10 MG tablet Take 1 tablet (10 mg total) by mouth 2 (two) times daily before a meal. 60 tablet 3  . glucose blood test strip Use as instructed 100 each 12  . hydrochlorothiazide (HYDRODIURIL) 25 MG tablet Take 1 tablet (25 mg total) by mouth daily. 30 tablet 6  . ibuprofen (ADVIL,MOTRIN) 600 MG tablet Take 1 tablet (600 mg total) by mouth every 8 (eight) hours as needed. 60 tablet 2  . Lancets (ACCU-CHEK SOFT TOUCH) lancets Use as instructed 100 each 12  . metFORMIN (GLUCOPHAGE) 1000 MG tablet Take 1 tablet (1,000 mg total) by mouth 2 (two) times daily with a meal. 60 tablet 6  . cetirizine (ZYRTEC) 10 MG tablet Take 1 tablet (10 mg total) by mouth daily. (Patient not taking: Reported on 05/29/2018) 30 tablet 1  . hydrocortisone cream 0.5 % Apply 1 application topically 2 (two) times daily. (Patient not taking: Reported on 05/29/2018) 30 g 0  . ketoconazole (NIZORAL) 2 % cream Apply 1 application topically daily. Apply to both feet and between toes once daily for 6 weeks (Patient not taking:  Reported on 02/25/2018) 30 g 1  . metroNIDAZOLE (METROGEL VAGINAL) 0.75 % vaginal gel Place 1 Applicatorful vaginally at bedtime. (Patient not taking: Reported on 02/25/2018) 70 g 0  . olopatadine (PATANOL) 0.1 % ophthalmic solution Place 1 drop into the left eye 2 (two) times daily. (Patient not taking: Reported on 05/29/2018) 5 mL 1  . predniSONE (DELTASONE) 20 MG tablet Take 1 tablet (20 mg total) by mouth 2 (two) times daily with a meal. (Patient not taking: Reported on 02/25/2018) 10 tablet 0  . Vitamin D,  Ergocalciferol, (DRISDOL) 50000 units CAPS capsule   5   No current facility-administered medications on file prior to visit.     Observations/Objective: Alert, awake, oriented x3 Not in acute distress  CMP Latest Ref Rng & Units 07/26/2017 05/22/2017 02/12/2017  Glucose 65 - 99 mg/dL 186(H) 131(H) 122(H)  BUN 6 - 20 mg/dL _0 Creatinine 0.44 - 1.00 mg/dL 0.84 0.90 0.85  Sodium 135 - 145 mmol/L 140 144 143  Potassium 3.5 - 5.1 mmol/L 4.1 4.3 4.2  Chloride 101 - 111 mmol/L 102 107(H) 105  CO2 22 - 32 mmol/L _1 Calcium 8.9 - 10.3 mg/dL 9.4 9.3 9.7  Total Protein 6.0 - 8.5 g/dL - - 7.3  Total Bilirubin 0.0 - 1.2 mg/dL - - 0.3  Alkaline Phos 39 - 117 IU/L - - 110  AST 0 - 40 IU/L - - 19  ALT 0 - 32 IU/L - - 28    Lipid Panel     Component Value Date/Time   CHOL 198 02/12/2017 1518   TRIG 203 (H) 02/12/2017 1518   HDL 53 02/12/2017 1518   CHOLHDL 3.7 02/12/2017 1518   CHOLHDL 4.3 10/16/2013 1019   VLDL 23 10/16/2013 1019   LDLCALC 104 (H) 02/12/2017 1518    Lab Results  Component Value Date   HGBA1C 8.5 (H) 02/25/2018    Assessment and Plan: 1. Essential hypertension, benign Previous blood pressure was uncontrolled. Keep blood pressure log at home and continue current antihypertensive regimen Consider regimen change if still elevated Counseled on blood pressure goal of less than 130/80, low-sodium, DASH diet, medication compliance, 150 minutes of moderate intensity exercise per week. Discussed medication compliance, adverse effects. - CMP14+EGFR; Future  2. Hyperlipidemia, unspecified hyperlipidemia type Slightly above goal Continue low-cholesterol diet and lifestyle modifications - Lipid panel; Future  3. Type 2 diabetes mellitus with other specified complication, without long-term current use of insulin (HCC) Uncontrolled with A1c of 8.5 but regimen had been adjusted after last A1c She is due for A1c now Continue current regimen and if diarrhea is  unbearable we might have to switch from metformin to some other medication Commended on 20 pound weight loss - Microalbumin/Creatinine Ratio, Urine; Future - Hemoglobin A1c; Future  4. Pain of left lower extremity Pain is infrequent We will add Flexeril to regimen; continue gabapentin - cyclobenzaprine (FLEXERIL) 10 MG tablet; Take 1 tablet (10 mg total) by mouth at bedtime.  Dispense: 30 tablet; Refill: 0   Follow Up Instructions: Return in about 3 months (around 08/28/2018).    I discussed the assessment and treatment plan with the patient. The patient was provided an opportunity to ask questions and all were answered. The patient agreed with the plan and demonstrated an understanding of the instructions.   The patient was advised to call back or seek an in-person evaluation if the symptoms worsen or if the condition fails to improve as anticipated.  I provided 15 minutes total of non-face-to-face time during this encounter including median intraservice time, reviewing previous notes, labs, imaging, medications and explaining diagnosis and management.     [name], MD, FAAFP. [facility] [place], [place]   05/29/2018, 3:49 PM

## 2018-06-05 ENCOUNTER — Other Ambulatory Visit: Payer: BLUE CROSS/BLUE SHIELD

## 2018-06-06 DIAGNOSIS — R0689 Other abnormalities of breathing: Secondary | ICD-10-CM | POA: Diagnosis not present

## 2018-06-06 DIAGNOSIS — R42 Dizziness and giddiness: Secondary | ICD-10-CM | POA: Diagnosis not present

## 2018-06-06 DIAGNOSIS — R000 Tachycardia, unspecified: Secondary | ICD-10-CM | POA: Diagnosis not present

## 2018-06-06 DIAGNOSIS — I10 Essential (primary) hypertension: Secondary | ICD-10-CM | POA: Diagnosis not present

## 2018-07-12 ENCOUNTER — Telehealth: Payer: Self-pay | Admitting: Family Medicine

## 2018-07-12 NOTE — Telephone Encounter (Signed)
Patients call taken.  Patient identified by name and date of birth.  Patient states that she is experiencing a burning sensation in her hands for the last two weeks.  Patient has diabetes and is taking gabapentin.  Patients blood sugar today was 140.  Patient states gabapentin is not working.  Patient was made an appointment with Dr. Chapman Fitch on Monday as Dr. Margarita Rana was unavailable.  Patient acknowledged understanding of advice.

## 2018-07-12 NOTE — Telephone Encounter (Signed)
Noted  

## 2018-07-12 NOTE — Telephone Encounter (Signed)
Patient called stating her hands feel like they are on fire

## 2018-07-15 ENCOUNTER — Encounter: Payer: Self-pay | Admitting: Family Medicine

## 2018-07-15 ENCOUNTER — Other Ambulatory Visit: Payer: Self-pay

## 2018-07-15 ENCOUNTER — Ambulatory Visit: Payer: BC Managed Care – PPO | Attending: Family Medicine | Admitting: Family Medicine

## 2018-07-15 DIAGNOSIS — E1140 Type 2 diabetes mellitus with diabetic neuropathy, unspecified: Secondary | ICD-10-CM | POA: Diagnosis not present

## 2018-07-15 DIAGNOSIS — R200 Anesthesia of skin: Secondary | ICD-10-CM

## 2018-07-15 DIAGNOSIS — R202 Paresthesia of skin: Secondary | ICD-10-CM | POA: Diagnosis not present

## 2018-07-15 MED ORDER — GABAPENTIN 300 MG PO CAPS: 600.0000 mg | ORAL_CAPSULE | Freq: Three times a day (TID) | ORAL | 3 refills | Status: DC

## 2018-07-15 NOTE — Progress Notes (Signed)
Med refills   Per pt she is having burning in both her hand like someone pour hot water on her hand. Per pt this happens every morning when she wakes up   CBG at home is 130 all the time.

## 2018-07-15 NOTE — Progress Notes (Signed)
Virtual Visit via Telephone Note  I connected with Megan Lane on 07/15/18 at  1:50 PM EDT by telephone and verified that I am speaking with the correct person using two identifiers.   I discussed the limitations, risks, security and privacy concerns of performing an evaluation and management service by telephone and the availability of in person appointments. I also discussed with the patient that there may be a patient responsible charge related to this service. The patient expressed understanding and agreed to proceed.  Patient Location: Writer Location: Office Others participating in call: Emilio Aspen, CMA   History of Present Illness:      63 yo diabetic, right handed female with complaint of sensation in her left hand of burning as if someone has been pouring hot water over her hand.  She states that this often awakens her from sleep but has only been occurring for the past week to week and a half.  Patient states that the first time that it occurred it actually scared her.  Patient states that her fingertips also feel numb on the middle finger, index finger and thumb.  She states that she has had carpal tunnel in the past in her right hand and this does not really feel the same as the sensation she had when she had issues with right-sided carpal tunnel syndrome as she states that the carpal tunnel syndrome hurt more.  She does work in a job which requires repetitive motions.  She also is currently on gabapentin due to diabetic peripheral neuropathy in her feet.  She is not sure if the gabapentin is having any effect on her hand pain.  She states that the burning sensation does go away but can last for several hours.  So far the burning sensation in her left hand has only occurred in the morning.  She does not feel as if she has weakness in her hand. (On review of chart, patient had hemoglobin A1c of 8.5 in January and missed appointment for repeat A1c in April of this year).  On  review of systems she denies any current issues with shortness of breath or cough, no headache or dizziness, no recent fever or chills.  She denies any pain at the elbows or any radiation of pain from the neck or shoulder to her arms.  She has had no abdominal pain-no nausea/vomiting/diarrhea or constipation.  She denies urinary frequency or increased thirst.  No blurred vision.  She does have some fatigue.   Past Medical History:  Diagnosis Date  . Diabetes mellitus   . Hypertension     Past Surgical History:  Procedure Laterality Date  . ABDOMINAL HYSTERECTOMY    . KNEE SURGERY      Family History  Problem Relation Age of Onset  . Diabetes Mother   . Hypertension Mother   . Stroke Mother   . Heart disease Brother   . Breast cancer Sister 42    Social History   Tobacco Use  . Smoking status: Former Smoker    Last attempt to quit: 08/11/2005    Years since quitting: 12.9  . Smokeless tobacco: Never Used  Substance Use Topics  . Alcohol use: No  . Drug use: No     Allergies  Allergen Reactions  . Penicillins Rash       Observations/Objective: No vital signs or physical exam conducted as visit was done via telephone  Assessment and Plan: 1. Numbness and tingling in left hand Patient with complaint  of numbness and tingling in her left hand and abnormal sensation of burning in her left hand with numbness in 3 of her fingers.  Patient's distribution of numbness is compatible with carpal tunnel syndrome but not the sensation that her whole hand is burning.  She will be referred to a hand surgeon for further evaluation, possible EMG testing. - Ambulatory referral to Hand Surgery  2. Type 2 diabetes mellitus with diabetic neuropathy, without long-term current use of insulin (HCC) She currently takes gabapentin 600 mg twice daily to help with peripheral neuropathy in her feet and this dose will be increased to 3 times daily to see if this may help with her abnormal sensation in  the left hand. - gabapentin (NEURONTIN) 300 MG capsule; Take 2 capsules (600 mg total) by mouth 3 (three) times daily.  Dispense: 180 capsule; Refill: 3  Follow Up Instructions:Return for referred to hand specialist; keep scheduled follow-up.    I discussed the assessment and treatment plan with the patient. The patient was provided an opportunity to ask questions and all were answered. The patient agreed with the plan and demonstrated an understanding of the instructions.   The patient was advised to call back or seek an in-person evaluation if the symptoms worsen or if the condition fails to improve as anticipated.  I provided 11 minutes of non-face-to-face time during this encounter.   Antony Blackbird, MD

## 2018-07-29 ENCOUNTER — Ambulatory Visit: Payer: BC Managed Care – PPO | Admitting: Physician Assistant

## 2018-08-01 ENCOUNTER — Ambulatory Visit: Payer: BC Managed Care – PPO | Admitting: Physician Assistant

## 2018-08-12 ENCOUNTER — Encounter: Payer: Self-pay | Admitting: Podiatry

## 2018-08-12 ENCOUNTER — Ambulatory Visit (INDEPENDENT_AMBULATORY_CARE_PROVIDER_SITE_OTHER): Payer: BC Managed Care – PPO | Admitting: Podiatry

## 2018-08-12 ENCOUNTER — Other Ambulatory Visit: Payer: Self-pay

## 2018-08-12 VITALS — Temp 97.6°F

## 2018-08-12 DIAGNOSIS — E1149 Type 2 diabetes mellitus with other diabetic neurological complication: Secondary | ICD-10-CM | POA: Diagnosis not present

## 2018-08-12 DIAGNOSIS — B353 Tinea pedis: Secondary | ICD-10-CM | POA: Diagnosis not present

## 2018-08-12 DIAGNOSIS — M79674 Pain in right toe(s): Secondary | ICD-10-CM

## 2018-08-12 DIAGNOSIS — B351 Tinea unguium: Secondary | ICD-10-CM

## 2018-08-12 DIAGNOSIS — L84 Corns and callosities: Secondary | ICD-10-CM | POA: Diagnosis not present

## 2018-08-12 NOTE — Patient Instructions (Signed)
Diabetes Mellitus and Foot Care Foot care is an important part of your health, especially when you have diabetes. Diabetes may cause you to have problems because of poor blood flow (circulation) to your feet and legs, which can cause your skin to:  Become thinner and drier.  Break more easily.  Heal more slowly.  Peel and crack. You may also have nerve damage (neuropathy) in your legs and feet, causing decreased feeling in them. This means that you may not notice minor injuries to your feet that could lead to more serious problems. Noticing and addressing any potential problems early is the best way to prevent future foot problems. How to care for your feet Foot hygiene  Wash your feet daily with warm water and mild soap. Do not use hot water. Then, pat your feet and the areas between your toes until they are completely dry. Do not soak your feet as this can dry your skin.  Trim your toenails straight across. Do not dig under them or around the cuticle. File the edges of your nails with an emery board or nail file.  Apply a moisturizing lotion or petroleum jelly to the skin on your feet and to dry, brittle toenails. Use lotion that does not contain alcohol and is unscented. Do not apply lotion between your toes. Shoes and socks  Wear clean socks or stockings every day. Make sure they are not too tight. Do not wear knee-high stockings since they may decrease blood flow to your legs.  Wear shoes that fit properly and have enough cushioning. Always look in your shoes before you put them on to be sure there are no objects inside.  To break in new shoes, wear them for just a few hours a day. This prevents injuries on your feet. Wounds, scrapes, corns, and calluses  Check your feet daily for blisters, cuts, bruises, sores, and redness. If you cannot see the bottom of your feet, use a mirror or ask someone for help.  Do not cut corns or calluses or try to remove them with medicine.  If you  find a minor scrape, cut, or break in the skin on your feet, keep it and the skin around it clean and dry. You may clean these areas with mild soap and water. Do not clean the area with peroxide, alcohol, or iodine.  If you have a wound, scrape, corn, or callus on your foot, look at it several times a day to make sure it is healing and not infected. Check for: ? Redness, swelling, or pain. ? Fluid or blood. ? Warmth. ? Pus or a bad smell. General instructions  Do not cross your legs. This may decrease blood flow to your feet.  Do not use heating pads or hot water bottles on your feet. They may burn your skin. If you have lost feeling in your feet or legs, you may not know this is happening until it is too late.  Protect your feet from hot and cold by wearing shoes, such as at the beach or on hot pavement.  Schedule a complete foot exam at least once a year (annually) or more often if you have foot problems. If you have foot problems, report any cuts, sores, or bruises to your health care provider immediately. Contact a health care provider if:  You have a medical condition that increases your risk of infection and you have any cuts, sores, or bruises on your feet.  You have an injury that is not   healing.  You have redness on your legs or feet.  You feel burning or tingling in your legs or feet.  You have pain or cramps in your legs and feet.  Your legs or feet are numb.  Your feet always feel cold.  You have pain around a toenail. Get help right away if:  You have a wound, scrape, corn, or callus on your foot and: ? You have pain, swelling, or redness that gets worse. ? You have fluid or blood coming from the wound, scrape, corn, or callus. ? Your wound, scrape, corn, or callus feels warm to the touch. ? You have pus or a bad smell coming from the wound, scrape, corn, or callus. ? You have a fever. ? You have a red line going up your leg. Summary  Check your feet every day  for cuts, sores, red spots, swelling, and blisters.  Moisturize feet and legs daily.  Wear shoes that fit properly and have enough cushioning.  If you have foot problems, report any cuts, sores, or bruises to your health care provider immediately.  Schedule a complete foot exam at least once a year (annually) or more often if you have foot problems. This information is not intended to replace advice given to you by your health care provider. Make sure you discuss any questions you have with your health care provider. Document Released: 01/21/2000 Document Revised: 03/07/2017 Document Reviewed: 02/25/2016 Elsevier Patient Education  2020 [employer]  

## 2018-08-13 MED ORDER — CLOTRIMAZOLE 1 % EX CREA: TOPICAL_CREAM | CUTANEOUS | 1 refills | Status: DC

## 2018-08-13 NOTE — Progress Notes (Signed)
Subjective: Megan Lane presents with diabetes, diabetic neuropathy for routine foot care with painful mycotic toenails and chronic callosities which interfere with activities of daily living. Pain is aggravated when wearing enclosed shoe gear. Pain is relieved with periodic professional debridement.  [name], MD is her PCP.  Last visit May 29, 2018.   Current Outpatient Medications:  .  acetaminophen (TYLENOL) 500 MG tablet, Take 2 tablets (1,000 mg total) by mouth every 8 (eight) hours as needed for moderate pain., Disp: 30 tablet, Rfl: 0 .  atorvastatin (LIPITOR) 20 MG tablet, Take 1 tablet (20 mg total) by mouth daily., Disp: 30 tablet, Rfl: 6 .  cetirizine (ZYRTEC) 10 MG tablet, Take 1 tablet (10 mg total) by mouth daily., Disp: 30 tablet, Rfl: 1 .  cyclobenzaprine (FLEXERIL) 10 MG tablet, Take 1 tablet (10 mg total) by mouth at bedtime., Disp: 30 tablet, Rfl: 0 .  gabapentin (NEURONTIN) 300 MG capsule, Take 2 capsules (600 mg total) by mouth 3 (three) times daily., Disp: 180 capsule, Rfl: 3 .  glipiZIDE (GLUCOTROL) 10 MG tablet, Take 1 tablet (10 mg total) by mouth 2 (two) times daily before a meal., Disp: 60 tablet, Rfl: 3 .  glucose blood test strip, Use as instructed, Disp: 100 each, Rfl: 12 .  hydrochlorothiazide (HYDRODIURIL) 25 MG tablet, Take 1 tablet (25 mg total) by mouth daily., Disp: 30 tablet, Rfl: 6 .  hydrocortisone cream 0.5 %, Apply 1 application topically 2 (two) times daily., Disp: 30 g, Rfl: 0 .  ibuprofen (ADVIL,MOTRIN) 600 MG tablet, Take 1 tablet (600 mg total) by mouth every 8 (eight) hours as needed., Disp: 60 tablet, Rfl: 2 .  ketoconazole (NIZORAL) 2 % cream, Apply 1 application topically daily. Apply to both feet and between toes once daily for 6 weeks, Disp: 30 g, Rfl: 1 .  Lancets (ACCU-CHEK SOFT TOUCH) lancets, Use as instructed, Disp: 100 each, Rfl: 12 .  metFORMIN (GLUCOPHAGE) 1000 MG tablet, Take 1 tablet (1,000 mg total) by mouth 2 (two) times  daily with a meal., Disp: 60 tablet, Rfl: 6 .  metroNIDAZOLE (METROGEL VAGINAL) 0.75 % vaginal gel, Place 1 Applicatorful vaginally at bedtime., Disp: 70 g, Rfl: 0 .  olopatadine (PATANOL) 0.1 % ophthalmic solution, Place 1 drop into the left eye 2 (two) times daily., Disp: 5 mL, Rfl: 1 .  Vitamin D, Ergocalciferol, (DRISDOL) 50000 units CAPS capsule, , Disp: , Rfl: 5  Allergies  Allergen Reactions  . Penicillins Rash    Objective: Vitals:   08/12/18 1405  Temp: 97.6 F (36.4 C)    Vascular Examination: Capillary refill time immediate x10 digits.  Dorsalis pedis and posterior tibial pulses are palpable bilaterally.  Digital hair is present x4 digits.  Skin temperature gradient within normal limits bilaterally.  Dermatological Examination: Skin with normal turgor, texture and tone b/l.  Toenails 1-5 b/l discolored, thick, dystrophic with subungual debris and pain with palpation to nailbeds due to thickness of nails.  Hyperkeratotic lesion(s) submetatarsal heads 1, 4 right foot and submetatarsal heads 1, 2, 3, 4 left foot.  There is also a small hyperkeratotic lesion on the lateral aspect of the right foot. No erythema, no edema, no drainage, no flocculence noted.   Diffuse scaling noted peripherally and plantarly b/l feet with mild foot odor.  No interdigital macerations.  No blisters, no weeping. No signs of secondary bacterial infection noted.  Musculoskeletal: Muscle strength 5/5 to all LE muscle groups.  No pain, crepitus or joint limitation with passive/active  ROM.  Neurological: Sensation diminished with 10 gram monofilament.  Vibratory sensation diminished  Assessment: 1. Painful onychomycosis toenails 1-5 b/l 2. Calluses submetatarsal heads 1, 4 right foot and submetatarsal heads 1, 2, 3, 4 left foot 3. Tinea pedis bilaterally. 4. NIDDM with Diabetic neuropathy  Plan: 1. Continue diabetic foot care principles. Literature dispensed on today. 2. Toenails 1-5 b/l were  debrided in length and girth without iatrogenic bleeding. 3. Calluses pared submetatarsal heads 1, 4 right foot and submetatarsal heads 1, 2, 3, 4 left foot utilizing sterile scalpel blade without incident.  Rx for Clotrimazole Cream 1% to be applied to both feet and between toes bid. 4. Patient to continue soft, supportive shoe gear daily. 5. Patient to report any pedal injuries to medical professional immediately.  6. Follow up 3 months.  7. Patient/POA to call should there be a concern in the interim.

## 2018-09-02 ENCOUNTER — Ambulatory Visit: Payer: BC Managed Care – PPO | Attending: Family Medicine | Admitting: Family Medicine

## 2018-09-02 ENCOUNTER — Other Ambulatory Visit: Payer: Self-pay

## 2018-09-02 ENCOUNTER — Encounter: Payer: Self-pay | Admitting: Family Medicine

## 2018-09-02 DIAGNOSIS — E1140 Type 2 diabetes mellitus with diabetic neuropathy, unspecified: Secondary | ICD-10-CM

## 2018-09-02 DIAGNOSIS — E089 Diabetes mellitus due to underlying condition without complications: Secondary | ICD-10-CM | POA: Diagnosis not present

## 2018-09-02 DIAGNOSIS — E782 Mixed hyperlipidemia: Secondary | ICD-10-CM | POA: Diagnosis not present

## 2018-09-02 DIAGNOSIS — M79605 Pain in left leg: Secondary | ICD-10-CM

## 2018-09-02 MED ORDER — GLIPIZIDE 10 MG PO TABS: 10.0000 mg | ORAL_TABLET | Freq: Two times a day (BID) | ORAL | 6 refills | Status: DC

## 2018-09-02 MED ORDER — GABAPENTIN 300 MG PO CAPS: 600.0000 mg | ORAL_CAPSULE | Freq: Three times a day (TID) | ORAL | 6 refills | Status: DC

## 2018-09-02 MED ORDER — METFORMIN HCL 1000 MG PO TABS: 1000.0000 mg | ORAL_TABLET | Freq: Two times a day (BID) | ORAL | 6 refills | Status: DC

## 2018-09-02 MED ORDER — IBUPROFEN 600 MG PO TABS: 600.0000 mg | ORAL_TABLET | Freq: Three times a day (TID) | ORAL | 2 refills | Status: DC | PRN

## 2018-09-02 MED ORDER — ATORVASTATIN CALCIUM 20 MG PO TABS: 20.0000 mg | ORAL_TABLET | Freq: Every day | ORAL | 6 refills | Status: DC

## 2018-09-02 NOTE — Progress Notes (Signed)
Virtual Visit via Telephone Note  I connected with Megan Lane, on 09/02/2018 at 4:40 PM by telephone due to the COVID-19 pandemic and verified that I am speaking with the correct person using two identifiers.   Consent: I discussed the limitations, risks, security and privacy concerns of performing an evaluation and management service by telephone and the availability of in person appointments. I also discussed with the patient that there may be a patient responsible charge related to this service. The patient expressed understanding and agreed to proceed.   Location of Patient: Environmental education officer of Provider: Clinic   Persons participating in Telemedicine visit: Caffie M Stegman Alicia Farrington-CMA Dr. Felecia Shelling     History of Present Illness: Megan Lane   is a 63 year old female with Type 2 Diabetes Mellitus (A1c 8.5), Diabetic neuropathy, Hyperlipidemia, Hypertension here for a follow-up visit. She has lost 5 lbs since her last visit. Fasting sugars are in the 130 range. Denies hypoglycemia but does have neuropathy in her hands which is controlled on Gabapentin. She has seen Ophthalmology in the last 1 year. She does have L leg pain which is rated as a 10/10 and "takes her down to the floor"; this is controlled on Ibuprofen and pain is absent at this time. Doing well on her antihypertensive and Statin   Past Medical History:  Diagnosis Date  . Diabetes mellitus   . Hypertension    Allergies  Allergen Reactions  . Penicillins Rash    Current Outpatient Medications on File Prior to Visit  Medication Sig Dispense Refill  . cetirizine (ZYRTEC) 10 MG tablet Take 1 tablet (10 mg total) by mouth daily. 30 tablet 1  . cyclobenzaprine (FLEXERIL) 10 MG tablet Take 1 tablet (10 mg total) by mouth at bedtime. 30 tablet 0  . acetaminophen (TYLENOL) 500 MG tablet Take 2 tablets (1,000 mg total) by mouth every 8 (eight) hours as needed for moderate pain. 30 tablet 0  .  clotrimazole (LOTRIMIN) 1 % cream Apply to both feet and between toes twice daily 45 g 1  . glucose blood test strip Use as instructed 100 each 12  . hydrochlorothiazide (HYDRODIURIL) 25 MG tablet Take 1 tablet (25 mg total) by mouth daily. (Patient not taking: Reported on 09/02/2018) 30 tablet 6  . hydrocortisone cream 0.5 % Apply 1 application topically 2 (two) times daily. 30 g 0  . ketoconazole (NIZORAL) 2 % cream Apply 1 application topically daily. Apply to both feet and between toes once daily for 6 weeks 30 g 1  . Lancets (ACCU-CHEK SOFT TOUCH) lancets Use as instructed 100 each 12  . metroNIDAZOLE (METROGEL VAGINAL) 0.75 % vaginal gel Place 1 Applicatorful vaginally at bedtime. 70 g 0  . olopatadine (PATANOL) 0.1 % ophthalmic solution Place 1 drop into the left eye 2 (two) times daily. 5 mL 1  . Vitamin D, Ergocalciferol, (DRISDOL) 50000 units CAPS capsule   5   No current facility-administered medications on file prior to visit.     Observations/Objective: AAOx3 Not in acute distress  CMP Latest Ref Rng & Units 07/26/2017 05/22/2017 02/12/2017  Glucose 65 - 99 mg/dL 186(H) 131(H) 122(H)  BUN 6 - 20 mg/dL '10 14 12  ' Creatinine 0.44 - 1.00 mg/dL 0.84 0.90 0.85  Sodium 135 - 145 mmol/L 140 144 143  Potassium 3.5 - 5.1 mmol/L 4.1 4.3 4.2  Chloride 101 - 111 mmol/L 102 107(H) 105  CO2 22 - 32 mmol/L '26 22 21  ' Calcium  8.9 - 10.3 mg/dL 9.4 9.3 9.7  Total Protein 6.0 - 8.5 g/dL - - 7.3  Total Bilirubin 0.0 - 1.2 mg/dL - - 0.3  Alkaline Phos 39 - 117 IU/L - - 110  AST 0 - 40 IU/L - - 19  ALT 0 - 32 IU/L - - 28    Lipid Panel     Component Value Date/Time   CHOL 198 02/12/2017 1518   TRIG 203 (H) 02/12/2017 1518   HDL 53 02/12/2017 1518   CHOLHDL 3.7 02/12/2017 1518   CHOLHDL 4.3 10/16/2013 1019   VLDL 23 10/16/2013 1019   LDLCALC 104 (H) 02/12/2017 1518    Lab Results  Component Value Date   HGBA1C 8.5 (H) 02/25/2018     Assessment and Plan: 1. Mixed hyperlipidemia LDL  of 104 which Is slighty above goal of <100 No regimen change today Low cholesterol diet - atorvastatin (LIPITOR) 20 MG tablet; Take 1 tablet (20 mg total) by mouth daily.  Dispense: 30 tablet; Refill: 6  2. Type 2 diabetes mellitus with diabetic neuropathy, without long-term current use of insulin (HCC) Uncontrolled with A1c of 8.5; goal is <7.0 A1c ordered and will adjust regimen accordingly Counseled on Diabetic diet, my plate method, 707 minutes of moderate intensity exercise/week Keep blood sugar logs with fasting goals of 80-120 mg/dl, random of less than 180 and in the event of sugars less than 60 mg/dl or greater than 400 mg/dl please notify the clinic ASAP. It is recommended that you undergo annual eye exams and annual foot exams. Pneumonia vaccine is recommended. - CMP14+EGFR; Future - Lipid panel; Future - Hemoglobin A1c; Future - gabapentin (NEURONTIN) 300 MG capsule; Take 2 capsules (600 mg total) by mouth 3 (three) times daily.  Dispense: 180 capsule; Refill: 6 - metFORMIN (GLUCOPHAGE) 1000 MG tablet; Take 1 tablet (1,000 mg total) by mouth 2 (two) times daily with a meal.  Dispense: 60 tablet; Refill: 6  3. Pain of left lower extremity - ibuprofen (ADVIL) 600 MG tablet; Take 1 tablet (600 mg total) by mouth every 8 (eight) hours as needed.  Dispense: 60 tablet; Refill: 2  4. Diabetes mellitus due to underlying condition without complication, without long-term current use of insulin ([place]) See #2 above - glipiZIDE (GLUCOTROL) 10 MG tablet; Take 1 tablet (10 mg total) by mouth 2 (two) times daily before a meal.  Dispense: 60 tablet; Refill: 6   Follow Up Instructions: 3 months   I discussed the assessment and treatment plan with the patient. The patient was provided an opportunity to ask questions and all were answered. The patient agreed with the plan and demonstrated an understanding of the instructions.   The patient was advised to call back or seek an in-person  evaluation if the symptoms worsen or if the condition fails to improve as anticipated.     I provided 20 minutes total of non-face-to-face time during this encounter including median intraservice time, reviewing previous notes, labs, imaging, medications, management and patient verbalized understanding.     [name], MD, FAAFP. [facility] and [facility], [place]   09/02/2018, 4:40 PM

## 2018-09-02 NOTE — Progress Notes (Signed)
C/o left leg discomfort. 10/10  She states she was not able to follow up with the orthopedist d/t work.  Hand pain/ burning is better. Just experienced one time since speaking with Dr. Margarita Rana last

## 2018-09-03 ENCOUNTER — Ambulatory Visit: Payer: BC Managed Care – PPO | Attending: Family Medicine

## 2018-09-03 DIAGNOSIS — E1140 Type 2 diabetes mellitus with diabetic neuropathy, unspecified: Secondary | ICD-10-CM

## 2018-09-04 LAB — CMP14+EGFR
ALT: 28 IU/L (ref 0–32)
AST: 24 IU/L (ref 0–40)
Albumin/Globulin Ratio: 1.5 (ref 1.2–2.2)
Albumin: 4.4 g/dL (ref 3.8–4.8)
Alkaline Phosphatase: 113 IU/L (ref 39–117)
BUN/Creatinine Ratio: 18 (ref 12–28)
BUN: 18 mg/dL (ref 8–27)
Bilirubin Total: <0.2 mg/dL (ref 0.0–1.2)
CO2: 24 mmol/L (ref 20–29)
Calcium: 9.6 mg/dL (ref 8.7–10.3)
Chloride: 101 mmol/L (ref 96–106)
Creatinine, Ser: 1.02 mg/dL — ABNORMAL HIGH (ref 0.57–1.00)
GFR calc Af Amer: 69 mL/min/{1.73_m2} (ref >59)
GFR calc non Af Amer: 60 mL/min/{1.73_m2} (ref >59)
Globulin, Total: 2.9 g/dL (ref 1.5–4.5)
Glucose: 156 mg/dL — ABNORMAL HIGH (ref 65–99)
Potassium: 3.9 mmol/L (ref 3.5–5.2)
Sodium: 141 mmol/L (ref 134–144)
Total Protein: 7.3 g/dL (ref 6.0–8.5)

## 2018-09-04 LAB — LIPID PANEL
Chol/HDL Ratio: 3.9 ratio (ref 0.0–4.4)
Cholesterol, Total: 186 mg/dL (ref 100–199)
HDL: 48 mg/dL (ref >39)
LDL Calculated: 94 mg/dL (ref 0–99)
Triglycerides: 219 mg/dL — ABNORMAL HIGH (ref 0–149)
VLDL Cholesterol Cal: 44 mg/dL — ABNORMAL HIGH (ref 5–40)

## 2018-09-04 LAB — HEMOGLOBIN A1C
Est. average glucose Bld gHb Est-mCnc: 197 mg/dL
Hgb A1c MFr Bld: 8.5 % — ABNORMAL HIGH (ref 4.8–5.6)

## 2018-09-05 ENCOUNTER — Other Ambulatory Visit: Payer: Self-pay | Admitting: Family Medicine

## 2018-09-05 MED ORDER — FARXIGA 10 MG PO TABS: 10.0000 mg | ORAL_TABLET | Freq: Every day | ORAL | 3 refills | Status: DC

## 2018-10-01 ENCOUNTER — Ambulatory Visit: Payer: BC Managed Care – PPO | Admitting: Family Medicine

## 2018-10-30 ENCOUNTER — Ambulatory Visit: Payer: BC Managed Care – PPO | Attending: Family Medicine | Admitting: Family Medicine

## 2018-10-30 ENCOUNTER — Encounter: Payer: Self-pay | Admitting: Family Medicine

## 2018-10-30 ENCOUNTER — Other Ambulatory Visit: Payer: Self-pay

## 2018-10-30 VITALS — BP 112/73 | HR 91 | Temp 98.2°F | Ht 72.0 in | Wt 241.4 lb

## 2018-10-30 DIAGNOSIS — Z23 Encounter for immunization: Secondary | ICD-10-CM | POA: Diagnosis not present

## 2018-10-30 DIAGNOSIS — Z01419 Encounter for gynecological examination (general) (routine) without abnormal findings: Secondary | ICD-10-CM | POA: Diagnosis not present

## 2018-10-30 DIAGNOSIS — M62838 Other muscle spasm: Secondary | ICD-10-CM | POA: Diagnosis not present

## 2018-10-30 NOTE — Progress Notes (Signed)
Subjective:  Patient ID: Megan Lane, female    DOB: Jun 16, 1955  Age: 63 y.o. 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 She had a partial hysterectomy when she was in her 59s. Today she complains of feeling her right arm is swollen and further describes this as a sensation of a fist ball in her right upper arm but denies the presence of pain, numbness or tingling. She denies a history of trauma.  Past Medical History:  Diagnosis Date  . Diabetes mellitus   . Hypertension     Past Surgical History:  Procedure Laterality Date  . ABDOMINAL HYSTERECTOMY    . KNEE SURGERY      Family History  Problem Relation Age of Onset  . Diabetes Mother   . Hypertension Mother   . Stroke Mother   . Heart disease Brother   . Breast cancer Sister 47    Allergies  Allergen Reactions  . Penicillins Rash    Outpatient Medications Prior to Visit  Medication Sig Dispense Refill  . acetaminophen (TYLENOL) 500 MG tablet Take 2 tablets (1,000 mg total) by mouth every 8 (eight) hours as needed for moderate pain. 30 tablet 0  . atorvastatin (LIPITOR) 20 MG tablet Take 1 tablet (20 mg total) by mouth daily. 30 tablet 6  . cetirizine (ZYRTEC) 10 MG tablet Take 1 tablet (10 mg total) by mouth daily. 30 tablet 1  . clotrimazole (LOTRIMIN) 1 % cream Apply to both feet and between toes twice daily 45 g 1  . cyclobenzaprine (FLEXERIL) 10 MG tablet Take 1 tablet (10 mg total) by mouth at bedtime. 30 tablet 0  . dapagliflozin propanediol (FARXIGA) 10 MG TABS tablet Take 10 mg by mouth daily. 30 tablet 3  . gabapentin (NEURONTIN) 300 MG capsule Take 2 capsules (600 mg total) by mouth 3 (three) times daily. 180 capsule 6  . glipiZIDE (GLUCOTROL) 10 MG tablet Take 1 tablet (10 mg total) by mouth 2 (two) times daily before a meal. 60 tablet 6  . glucose blood test strip Use as instructed 100 each 12  . hydrocortisone cream 0.5 % Apply 1 application topically 2  (two) times daily. 30 g 0  . ibuprofen (ADVIL) 600 MG tablet Take 1 tablet (600 mg total) by mouth every 8 (eight) hours as needed. 60 tablet 2  . ketoconazole (NIZORAL) 2 % cream Apply 1 application topically daily. Apply to both feet and between toes once daily for 6 weeks 30 g 1  . Lancets (ACCU-CHEK SOFT TOUCH) lancets Use as instructed 100 each 12  . metFORMIN (GLUCOPHAGE) 1000 MG tablet Take 1 tablet (1,000 mg total) by mouth 2 (two) times daily with a meal. 60 tablet 6  . olopatadine (PATANOL) 0.1 % ophthalmic solution Place 1 drop into the left eye 2 (two) times daily. 5 mL 1  . Vitamin D, Ergocalciferol, (DRISDOL) 50000 units CAPS capsule   5  . hydrochlorothiazide (HYDRODIURIL) 25 MG tablet Take 1 tablet (25 mg total) by mouth daily. (Patient not taking: Reported on 09/02/2018) 30 tablet 6  . metroNIDAZOLE (METROGEL VAGINAL) 0.75 % vaginal gel Place 1 Applicatorful vaginally at bedtime. (Patient not taking: Reported on 10/30/2018) 70 g 0   No facility-administered medications prior to visit.      ROS Review of Systems  Constitutional: Negative for activity change, appetite change and fatigue.  HENT: Negative for congestion, sinus pressure and sore throat.   Eyes: Negative  for visual disturbance.  Respiratory: Negative for cough, chest tightness, shortness of breath and wheezing.   Cardiovascular: Negative for chest pain and palpitations.  Gastrointestinal: Negative for abdominal distention, abdominal pain and constipation.  Endocrine: Negative for polydipsia.  Genitourinary: Negative for dysuria and frequency.  Musculoskeletal: Negative for arthralgias and back pain.  Skin: Negative for rash.  Neurological: Negative for tremors, light-headedness and numbness.  Hematological: Does not bruise/bleed easily.  Psychiatric/Behavioral: Negative for agitation and behavioral problems.    Objective:  BP 112/73   Pulse 91   Temp 98.2 F (36.8 C) (Oral)   Ht 6' (1.829 m)   Wt 241 lb  6.4 oz (109.5 kg)   SpO2 96%   BMI 32.74 kg/m   BP/Weight 10/30/2018 02/25/2018 [id number]  Systolic BP [id number]  Diastolic BP 73 88 82  Wt. (Lbs) 241.4 238.2 238  BMI 32.74 32.31 32.28      Physical Exam Constitutional:      Appearance: She is well-developed.  Neck:     Vascular: No JVD.  Cardiovascular:     Rate and Rhythm: Normal rate.     Heart sounds: Normal heart sounds. No murmur.  Pulmonary:     Effort: Pulmonary effort is normal.     Breath sounds: Normal breath sounds. No wheezing or rales.  Chest:     Chest wall: No tenderness.  Abdominal:     General: Bowel sounds are normal. There is no distension.     Palpations: Abdomen is soft. There is no mass.     Tenderness: There is no abdominal tenderness.  Genitourinary:    Comments: External genitalia, vagina-normal Cervix is absent Musculoskeletal: Normal range of motion.     Right lower leg: No edema.     Left lower leg: No edema.  Neurological:     Mental Status: She is alert and oriented to person, place, and time.  Psychiatric:        Mood and Affect: Mood normal.     CMP Latest Ref Rng & Units 09/03/2018 07/26/2017 05/22/2017  Glucose 65 - 99 mg/dL 156(H) 186(H) 131(H)  BUN 8 - 27 mg/dL 18 10 14   Creatinine 0.57 - 1.00 mg/dL 1.02(H) 0.84 0.90  Sodium 134 - 144 mmol/L 141 140 144  Potassium 3.5 - 5.2 mmol/L 3.9 4.1 4.3  Chloride 96 - 106 mmol/L 101 102 107(H)  CO2 20 - 29 mmol/L 24 26 22   Calcium 8.7 - 10.3 mg/dL 9.6 9.4 9.3  Total Protein 6.0 - 8.5 g/dL 7.3 - -  Total Bilirubin 0.0 - 1.2 mg/dL <0.2 - -  Alkaline Phos 39 - 117 IU/L 113 - -  AST 0 - 40 IU/L 24 - -  ALT 0 - 32 IU/L 28 - -    Lipid Panel     Component Value Date/Time   CHOL 186 09/03/2018 1359   TRIG 219 (H) 09/03/2018 1359   HDL 48 09/03/2018 1359   CHOLHDL 3.9 09/03/2018 1359   CHOLHDL 4.3 10/16/2013 1019   VLDL 23 10/16/2013 1019   LDLCALC 94 09/03/2018 1359    CBC    Component Value Date/Time   WBC 7.7 07/26/2017  0148   RBC 4.72 07/26/2017 0148   HGB 13.3 07/26/2017 0148   HCT 41.1 07/26/2017 0148   PLT 373 07/26/2017 0148   MCV 87.1 07/26/2017 0148   MCH 28.2 07/26/2017 0148   MCHC 32.4 07/26/2017 0148   RDW 13.9 07/26/2017 0148   LYMPHSABS 2.6 03/18/2013 1446  MONOABS 0.4 03/18/2013 1446   EOSABS 0.1 03/18/2013 1446   BASOSABS 0.0 03/18/2013 1446    Lab Results  Component Value Date   HGBA1C 8.5 (H) 09/03/2018    Assessment & Plan:   1. Encounter for gynecological examination Absent cervix and pelvic exam Advised she would no longer need Pap smears due to hysterectomy  2. Muscle spasm Could explain her right arm symptoms Currently on Flexeril  3. Need for influenza vaccination - Flu Vaccine QUAD 36+ mos IM  4. Need for vaccination for Strep pneumoniae - Pneumococcal polysaccharide vaccine 23-valent greater than or equal to 2yo subcutaneous/IM    No orders of the defined types were placed in this encounter.   Follow-up: Return in about 3 months (around 01/29/2019) for Diabetes mellitus.       [name], MD, FAAFP. [facility] and [place], [place]   10/30/2018, 3:58 PM

## 2018-10-30 NOTE — Progress Notes (Signed)
Patient states that right arm feels like it swells at times.

## 2018-11-05 ENCOUNTER — Other Ambulatory Visit: Payer: Self-pay | Admitting: Family Medicine

## 2018-11-05 DIAGNOSIS — I10 Essential (primary) hypertension: Secondary | ICD-10-CM

## 2018-11-08 ENCOUNTER — Telehealth: Payer: Self-pay | Admitting: Family Medicine

## 2018-11-08 NOTE — Telephone Encounter (Signed)
Pt called in regards to her medication  -dapagliflozin propanediol (FARXIGA) 10 MG TABS tablet  Would like to receive an alternative because it is $500 out of pocket

## 2018-11-08 NOTE — Telephone Encounter (Signed)
Will route to PCP for review. 

## 2018-11-11 ENCOUNTER — Ambulatory Visit: Payer: BC Managed Care – PPO | Admitting: Podiatry

## 2018-11-11 MED ORDER — JARDIANCE 10 MG PO TABS: 10.0000 mg | ORAL_TABLET | Freq: Every day | ORAL | 6 refills | Status: DC

## 2018-11-11 NOTE — Addendum Note (Signed)
Addended byCharlott Rakes on: 11/11/2018 02:18 PM   Modules accepted: Orders

## 2018-11-11 NOTE — Telephone Encounter (Signed)
Please could you inform her I switched her to Jardiance?  Thank you

## 2018-11-11 NOTE — Telephone Encounter (Signed)
Can you please see what alternatives are available? Thank you.

## 2018-11-11 NOTE — Telephone Encounter (Signed)
Ran test claims and coverage is offered for Jardiance for SGLT-2 inhibitor use if you desire to replace the [place].   Other classes covered include oral DPP-4 inhibitor: Januvia  Combination metformin-sitagliptin, Janumet, is also covered.

## 2018-11-11 NOTE — Telephone Encounter (Signed)
Pt was called but did not answer. I was able to leave a HIPAA-compliant VM informing her of the change needed for insurance preference.

## 2018-11-18 ENCOUNTER — Telehealth: Payer: Self-pay | Admitting: Family Medicine

## 2018-11-18 MED ORDER — DICLOFENAC SODIUM 1 % TD GEL: 2.0000 g | Freq: Four times a day (QID) | TRANSDERMAL | 0 refills | Status: DC

## 2018-11-18 NOTE — Telephone Encounter (Signed)
Patient will like medication changed or increased for her current arm pain.

## 2018-11-18 NOTE — Telephone Encounter (Signed)
Pt called to request a change in medication -cyclobenzaprine (FLEXERIL) 10 MG tablet  She states this medication only makes her fall asleep at night and throughout the day she has pain on her right arm. Please follow up  Confirmed [facility] ([place] [place] - [address]

## 2018-11-19 NOTE — Telephone Encounter (Signed)
Patient was called and informed of medication being sent to pharmacy. 

## 2018-12-12 ENCOUNTER — Other Ambulatory Visit: Payer: Self-pay | Admitting: Family Medicine

## 2018-12-12 DIAGNOSIS — J018 Other acute sinusitis: Secondary | ICD-10-CM

## 2018-12-25 ENCOUNTER — Other Ambulatory Visit: Payer: Self-pay | Admitting: Family Medicine

## 2018-12-25 DIAGNOSIS — I10 Essential (primary) hypertension: Secondary | ICD-10-CM

## 2019-01-13 ENCOUNTER — Telehealth: Payer: Self-pay | Admitting: Family Medicine

## 2019-01-13 NOTE — Telephone Encounter (Signed)
Patient states that Jardiance medication is 500.00 patient is requesting a medication change.

## 2019-01-13 NOTE — Telephone Encounter (Signed)
Please assist in medication management or scheduling tele for assistance.

## 2019-01-13 NOTE — Telephone Encounter (Signed)
Can you please assist with this?  Previously prescribed Wilder Glade which involved a high co-pay as well.  What other options do we have?  Thank you

## 2019-01-13 NOTE — Telephone Encounter (Signed)
Patient calling in regards to medication management. Please contact patient at earliest convenience.

## 2019-01-14 MED ORDER — SITAGLIPTIN PHOSPHATE 100 MG PO TABS: 100.0000 mg | ORAL_TABLET | Freq: Every day | ORAL | 6 refills | Status: DC

## 2019-01-14 NOTE — Addendum Note (Signed)
Addended by: [name] on: 01/14/2019 10:28 AM   Modules accepted: Orders

## 2019-01-14 NOTE — Telephone Encounter (Signed)
Patient was called and a voicemail was left informing patient to return phone call. 

## 2019-01-14 NOTE — Telephone Encounter (Signed)
Ran test claims for Januvia and Janumet and both are covered. I see where she tried Januvia in the past. Did she have to discontinue this d/t side effects?

## 2019-01-14 NOTE — Telephone Encounter (Signed)
Switched to [employer], [employer]

## 2019-02-04 ENCOUNTER — Ambulatory Visit: Payer: BC Managed Care – PPO | Admitting: Family Medicine

## 2019-02-12 ENCOUNTER — Encounter: Payer: Self-pay | Admitting: Family Medicine

## 2019-02-12 ENCOUNTER — Other Ambulatory Visit: Payer: Self-pay

## 2019-02-12 ENCOUNTER — Ambulatory Visit: Payer: BC Managed Care – PPO | Attending: Family Medicine | Admitting: Family Medicine

## 2019-02-12 DIAGNOSIS — M5431 Sciatica, right side: Secondary | ICD-10-CM

## 2019-02-12 DIAGNOSIS — I10 Essential (primary) hypertension: Secondary | ICD-10-CM

## 2019-02-12 DIAGNOSIS — M5432 Sciatica, left side: Secondary | ICD-10-CM

## 2019-02-12 DIAGNOSIS — K429 Umbilical hernia without obstruction or gangrene: Secondary | ICD-10-CM

## 2019-02-12 DIAGNOSIS — E1140 Type 2 diabetes mellitus with diabetic neuropathy, unspecified: Secondary | ICD-10-CM

## 2019-02-12 DIAGNOSIS — Z79899 Other long term (current) drug therapy: Secondary | ICD-10-CM

## 2019-02-12 DIAGNOSIS — Z794 Long term (current) use of insulin: Secondary | ICD-10-CM

## 2019-02-12 DIAGNOSIS — E782 Mixed hyperlipidemia: Secondary | ICD-10-CM

## 2019-02-12 MED ORDER — SITAGLIPTIN PHOSPHATE 100 MG PO TABS: 100.0000 mg | ORAL_TABLET | Freq: Every day | ORAL | 6 refills | Status: DC

## 2019-02-12 MED ORDER — HYDROCHLOROTHIAZIDE 25 MG PO TABS: 25.0000 mg | ORAL_TABLET | Freq: Every day | ORAL | 6 refills | Status: DC

## 2019-02-12 MED ORDER — IBUPROFEN 600 MG PO TABS: 600.0000 mg | ORAL_TABLET | Freq: Three times a day (TID) | ORAL | 2 refills | Status: DC | PRN

## 2019-02-12 MED ORDER — ATORVASTATIN CALCIUM 20 MG PO TABS: 20.0000 mg | ORAL_TABLET | Freq: Every day | ORAL | 6 refills | Status: DC

## 2019-02-12 MED ORDER — GABAPENTIN 300 MG PO CAPS: 600.0000 mg | ORAL_CAPSULE | Freq: Three times a day (TID) | ORAL | 6 refills | Status: DC

## 2019-02-12 MED ORDER — GLIPIZIDE 10 MG PO TABS: 10.0000 mg | ORAL_TABLET | Freq: Two times a day (BID) | ORAL | 6 refills | Status: DC

## 2019-02-12 MED ORDER — METFORMIN HCL 1000 MG PO TABS: 1000.0000 mg | ORAL_TABLET | Freq: Two times a day (BID) | ORAL | 6 refills | Status: DC

## 2019-02-12 MED ORDER — CYCLOBENZAPRINE HCL 10 MG PO TABS: 10.0000 mg | ORAL_TABLET | Freq: Every day | ORAL | 1 refills | Status: DC

## 2019-02-12 NOTE — Progress Notes (Signed)
Patient has been called and DOB has been verified. Patient has been screened and transferred to PCP to start phone visit.     

## 2019-02-12 NOTE — Progress Notes (Signed)
Virtual Visit via Telephone Note  I connected with Megan Lane, on 02/12/2019 at 10:27 AM by telephone due to the COVID-19 pandemic and verified that I am speaking with the correct person using two identifiers.   Consent: I discussed the limitations, risks, security and privacy concerns of performing an evaluation and management service by telephone and the availability of in person appointments. I also discussed with the patient that there may be a patient responsible charge related to this service. The patient expressed understanding and agreed to proceed.   Location of Patient: Work  Biomedical scientist of Provider: Clinic   Persons participating in Telemedicine visit: Shaylene M Eriona Agnes Farrington-CMA Dr. Margarita Rana     History of Present Illness: Megan Lane   is a 64 year old female with Type 2 Diabetes Mellitus (A1c 8.5), Diabetic neuropathy, Hyperlipidemia, Hypertension here for a follow-up visit.   She complains of pain in her legs which are intermittent and run down the side of her leg. Once she had to hold onto a shelf at the grocery store as pain starts in her hip and shoots down her legs. Uses Ibuprofen, Gabapentin for relief. States her hernia is acting acting up and she had seen a Psychologist, sport and exercise but unable to afford the surgery. It is superior to her umbilicus and does not bother her often but she is ready to go back to see a surgeon; she has no nausea, vomiting, constipation, diarrhea.  Fasting sugar this morning was 110; she is working on loosing weight and eating right.  Placed on Farxiga at her last visit which was not covered by her insurance and this was switched to [place]. She is compliant with her diabetic medication, her statin and antihypertensive and is tolerating her medications well.  Past Medical History:  Diagnosis Date  . Diabetes mellitus   . Hypertension    Allergies  Allergen Reactions  . Penicillins Rash    Current Outpatient Medications on File Prior  to Visit  Medication Sig Dispense Refill  . acetaminophen (TYLENOL) 500 MG tablet Take 2 tablets (1,000 mg total) by mouth every 8 (eight) hours as needed for moderate pain. 30 tablet 0  . atorvastatin (LIPITOR) 20 MG tablet Take 1 tablet (20 mg total) by mouth daily. 30 tablet 6  . cetirizine (ZYRTEC) 10 MG tablet Take 1 tablet by mouth once daily 30 tablet 2  . clotrimazole (LOTRIMIN) 1 % cream Apply to both feet and between toes twice daily 45 g 1  . cyclobenzaprine (FLEXERIL) 10 MG tablet Take 1 tablet (10 mg total) by mouth at bedtime. 30 tablet 0  . diclofenac sodium (VOLTAREN) 1 % GEL Apply 2 g topically 4 (four) times daily. 100 g 0  . gabapentin (NEURONTIN) 300 MG capsule Take 2 capsules (600 mg total) by mouth 3 (three) times daily. 180 capsule 6  . glipiZIDE (GLUCOTROL) 10 MG tablet Take 1 tablet (10 mg total) by mouth 2 (two) times daily before a meal. 60 tablet 6  . glucose blood test strip Use as instructed 100 each 12  . hydrochlorothiazide (HYDRODIURIL) 25 MG tablet Take 1 tablet by mouth once daily 30 tablet 1  . hydrocortisone cream 0.5 % Apply 1 application topically 2 (two) times daily. 30 g 0  . ibuprofen (ADVIL) 600 MG tablet Take 1 tablet (600 mg total) by mouth every 8 (eight) hours as needed. 60 tablet 2  . ketoconazole (NIZORAL) 2 % cream Apply 1 application topically daily. Apply to both feet and  between toes once daily for 6 weeks 30 g 1  . Lancets (ACCU-CHEK SOFT TOUCH) lancets Use as instructed 100 each 12  . metFORMIN (GLUCOPHAGE) 1000 MG tablet Take 1 tablet (1,000 mg total) by mouth 2 (two) times daily with a meal. 60 tablet 6  . metroNIDAZOLE (METROGEL VAGINAL) 0.75 % vaginal gel Place 1 Applicatorful vaginally at bedtime. 70 g 0  . olopatadine (PATANOL) 0.1 % ophthalmic solution Place 1 drop into the left eye 2 (two) times daily. 5 mL 1  . sitaGLIPtin (JANUVIA) 100 MG tablet Take 1 tablet (100 mg total) by mouth daily. 30 tablet 6  . Vitamin D, Ergocalciferol,  (DRISDOL) 50000 units CAPS capsule   5   No current facility-administered medications on file prior to visit.    Observations/Objective: Alert, awake, oriented x3 Not in acute distress  Lab Results  Component Value Date   HGBA1C 8.5 (H) 09/03/2018    Lipid Panel     Component Value Date/Time   CHOL 186 09/03/2018 1359   TRIG 219 (H) 09/03/2018 1359   HDL 48 09/03/2018 1359   CHOLHDL 3.9 09/03/2018 1359   CHOLHDL 4.3 10/16/2013 1019   VLDL 23 10/16/2013 1019   LDLCALC 94 09/03/2018 1359   LABVLDL 44 (H) 09/03/2018 1359    CMP Latest Ref Rng & Units 09/03/2018 07/26/2017 05/22/2017  Glucose 65 - 99 mg/dL 156(H) 186(H) 131(H)  BUN 8 - 27 mg/dL 18 10 14   Creatinine 0.57 - 1.00 mg/dL 1.02(H) 0.84 0.90  Sodium 134 - 144 mmol/L 141 140 144  Potassium 3.5 - 5.2 mmol/L 3.9 4.1 4.3  Chloride 96 - 106 mmol/L 101 102 107(H)  CO2 20 - 29 mmol/L 24 26 22   Calcium 8.7 - 10.3 mg/dL 9.6 9.4 9.3  Total Protein 6.0 - 8.5 g/dL 7.3 - -  Total Bilirubin 0.0 - 1.2 mg/dL <0.2 - -  Alkaline Phos 39 - 117 IU/L 113 - -  AST 0 - 40 IU/L 24 - -  ALT 0 - 32 IU/L 28 - -     Assessment and Plan: 1. Bilateral sciatica Stable Advised to perform stretching exercises at home - ibuprofen (ADVIL) 600 MG tablet; Take 1 tablet (600 mg total) by mouth every 8 (eight) hours as needed.  Dispense: 60 tablet; Refill: 2 - cyclobenzaprine (FLEXERIL) 10 MG tablet; Take 1 tablet (10 mg total) by mouth at bedtime.  Dispense: 30 tablet; Refill: 1  2. Type 2 diabetes mellitus with diabetic neuropathy, without long-term current use of insulin (HCC) Not fully optimized with A1c of 8.5 Hopefully addition of Januvia will be beneficial Continue diabetic diet, lifestyle modifications - gabapentin (NEURONTIN) 300 MG capsule; Take 2 capsules (600 mg total) by mouth 3 (three) times daily.  Dispense: 180 capsule; Refill: 6 - glipiZIDE (GLUCOTROL) 10 MG tablet; Take 1 tablet (10 mg total) by mouth 2 (two) times daily before a  meal.  Dispense: 60 tablet; Refill: 6 - metFORMIN (GLUCOPHAGE) 1000 MG tablet; Take 1 tablet (1,000 mg total) by mouth 2 (two) times daily with a meal.  Dispense: 60 tablet; Refill: 6 - sitaGLIPtin (JANUVIA) 100 MG tablet; Take 1 tablet (100 mg total) by mouth daily.  Dispense: 30 tablet; Refill: 6  3. Mixed hyperlipidemia Controlled Low-cholesterol diet - atorvastatin (LIPITOR) 20 MG tablet; Take 1 tablet (20 mg total) by mouth daily.  Dispense: 30 tablet; Refill: 6  4. Essential hypertension, benign Stable Counseled on blood pressure goal of less than 130/80, low-sodium, DASH diet, medication compliance,  150 minutes of moderate intensity exercise per week. Discussed medication compliance, adverse effects. - hydrochlorothiazide (HYDRODIURIL) 25 MG tablet; Take 1 tablet (25 mg total) by mouth daily.  Dispense: 30 tablet; Refill: 6  5. Umbilical hernia without obstruction and without gangrene - Ambulatory referral to General Surgery   Follow Up Instructions: 3 months-in person   I discussed the assessment and treatment plan with the patient. The patient was provided an opportunity to ask questions and all were answered. The patient agreed with the plan and demonstrated an understanding of the instructions.   The patient was advised to call back or seek an in-person evaluation if the symptoms worsen or if the condition fails to improve as anticipated.     I provided 20 minutes total of non-face-to-face time during this encounter including median intraservice time, reviewing previous notes, labs, imaging, medications, management and patient verbalized understanding.     [name], MD, FAAFP. [facility] [place], [place]   02/12/2019, 10:27 AM

## 2019-02-13 ENCOUNTER — Encounter: Payer: Self-pay | Admitting: Family Medicine

## 2019-02-24 ENCOUNTER — Ambulatory Visit: Payer: BC Managed Care – PPO | Admitting: Family Medicine

## 2019-03-04 ENCOUNTER — Ambulatory Visit: Payer: Self-pay | Attending: Internal Medicine

## 2019-03-19 ENCOUNTER — Ambulatory Visit: Payer: BC Managed Care – PPO | Admitting: Family Medicine

## 2019-03-20 ENCOUNTER — Ambulatory Visit: Payer: BC Managed Care – PPO | Attending: Family Medicine | Admitting: Physician Assistant

## 2019-03-20 DIAGNOSIS — E1140 Type 2 diabetes mellitus with diabetic neuropathy, unspecified: Secondary | ICD-10-CM | POA: Diagnosis not present

## 2019-03-20 DIAGNOSIS — K429 Umbilical hernia without obstruction or gangrene: Secondary | ICD-10-CM

## 2019-03-20 DIAGNOSIS — M5441 Lumbago with sciatica, right side: Secondary | ICD-10-CM | POA: Diagnosis not present

## 2019-03-20 MED ORDER — IBUPROFEN 600 MG PO TABS: 600.0000 mg | ORAL_TABLET | Freq: Three times a day (TID) | ORAL | 2 refills | Status: DC

## 2019-03-20 MED ORDER — METHOCARBAMOL 500 MG PO TABS: 1000.0000 mg | ORAL_TABLET | Freq: Three times a day (TID) | ORAL | 0 refills | Status: DC

## 2019-03-20 NOTE — Progress Notes (Signed)
Virtual Visit via Telephone Note  I connected with Megan Lane on 03/20/19 at  3:30 PM EST by telephone and verified that I am speaking with the correct person using two identifiers.   I discussed the limitations, risks, security and privacy concerns of performing an evaluation and management service by telephone and the availability of in person appointments. I also discussed with the patient that there may be a patient responsible charge related to this service. The patient expressed understanding and agreed to proceed.  PATIENT visit by telephone virtually in the context of Covid-19 pandemic. Patient location:  home My Location:  [place] office Persons on the call:  Me and the patient    History of Present Illness:  Patient c/o R lower back pain and into L lower back for about 2 weeks.  Some pain into R leg and R knee.  Not relieved by tylenol.  Pain is worse when standing at work(stands all day on cement floors).  No urinary s/sx.  NKI.    Blood sugar in low 100s.  No episodes of hyper/hypoglycemia  Also wants to get a referral bc the umbilical hernia she has seems to be bothering her more frequently.      Observations/Objective: NAD.  A&Ox3  Assessment and Plan: 1. Acute bilateral low back pain with right-sided sciatica No red flags - DG Lumbar Spine Complete; Future - ibuprofen (ADVIL) 600 MG tablet; Take 1 tablet (600 mg total) by mouth 3 (three) times daily. With food X 7 days then prn pain  Dispense: 60 tablet; Refill: 2 - methocarbamol (ROBAXIN) 500 MG tablet; Take 2 tablets (1,000 mg total) by mouth 3 (three) times daily. X 7 days then prn muscle spasm  Dispense: 90 tablet; Refill: 0  2. Umbilical hernia without obstruction and without gangrene - Ambulatory referral to General Surgery  3. Type 2 diabetes mellitus with diabetic neuropathy, without long-term current use of insulin ([place]) Controlled based on good home readings-continue current regimen;  Follow diabetic diet  and see PCP in 1 month.  Last A1C=8.5 08/2018   Follow Up Instructions: 1 month with PCP   I discussed the assessment and treatment plan with the patient. The patient was provided an opportunity to ask questions and all were answered. The patient agreed with the plan and demonstrated an understanding of the instructions.   The patient was advised to call back or seek an in-person evaluation if the symptoms worsen or if the condition fails to improve as anticipated.  I provided 17 minutes of non-face-to-face time during this encounter.   Freeman Caldron, PA-C  Patient ID: Megan Lane, female   DOB: 04/25/1955, 64 y.o.   MRN: [id number]

## 2019-03-20 NOTE — Progress Notes (Signed)
C/o R side mid back pain radiating to the rigt leg X 2 wks. Tylenol 500 mg  did not work for the pain. Pain worsen when standing CBG 57morning

## 2019-03-31 ENCOUNTER — Telehealth: Payer: Self-pay | Admitting: Family Medicine

## 2019-03-31 NOTE — Telephone Encounter (Signed)
Error. Patient had a question regarding her general surgery referral. Patients questions were answered, patient verbalized understanding and had no further questions.

## 2019-04-13 ENCOUNTER — Ambulatory Visit: Payer: BC Managed Care – PPO | Attending: Internal Medicine

## 2019-04-13 DIAGNOSIS — Z23 Encounter for immunization: Secondary | ICD-10-CM | POA: Insufficient documentation

## 2019-04-13 NOTE — Progress Notes (Signed)
   Covid-19 Vaccination Clinic  Name:  Megan Lane    MRN: [id number] DOB: 1955-02-28  04/13/2019  Megan Lane was observed post Covid-19 immunization for 15 minutes without incident. She was provided with Vaccine Information Sheet and instruction to access the V-Safe system.   Megan Lane was instructed to call 911 with any severe reactions post vaccine: Marland Kitchen Difficulty breathing  . Swelling of face and throat  . A fast heartbeat  . A bad rash all over body  . Dizziness and weakness   Immunizations Administered    Name Date Dose VIS Date Route   Pfizer COVID-19 Vaccine 04/13/2019  3:45 PM 0.3 mL 01/17/2019 Intramuscular   Manufacturer: [place]   Lot: [id number]   [place]: [id number]

## 2019-04-14 ENCOUNTER — Ambulatory Visit: Payer: Self-pay | Admitting: Surgery

## 2019-04-14 DIAGNOSIS — R1900 Intra-abdominal and pelvic swelling, mass and lump, unspecified site: Secondary | ICD-10-CM | POA: Diagnosis not present

## 2019-04-14 DIAGNOSIS — K429 Umbilical hernia without obstruction or gangrene: Secondary | ICD-10-CM | POA: Diagnosis not present

## 2019-04-14 NOTE — H&P (Signed)
Megan Lane Documented: 04/14/2019 3:13 PM Location: [facility] Patient #: [id number] DOB: 03/28/1955 Single / Language: Megan Lane / Race: Black or African American Female  History of Present Illness Megan Lane; 04/14/2019 4:00 PM) Patient words: Patient returns to clinic for follow-up of umbilical hernia. She seen in 2017 but was lost to follow-up. The bulge at her umbilicus is now more supraumbilical causing pain. It is larger than was before. She has pain with exertion or straining. No nausea or vomiting or change in bowel or bladder function.  The patient is a 64 year old female.   Past Surgical History (Megan Lane, [place]; 04/14/2019 3:13 PM) No pertinent past surgical history  Diagnostic Studies History (Megan Lane, [place]; 04/14/2019 3:13 PM) Colonoscopy >10 years ago Mammogram 1-3 years ago  Allergies (Megan Lane, [place]; 04/14/2019 3:13 PM) Penicillin G Procaine *PENICILLINS* Allergies Reconciled  Medication History (Megan Lane, [place]; 04/14/2019 3:14 PM) Atorvastatin Calcium (20MG  Tablet, Oral) Active. MetFORMIN HCl (1000MG  Tablet, Oral) Active. Methocarbamol (500MG  Tablet, Oral) Active. Gabapentin (300MG  Capsule, Oral) Active. glipiZIDE (10MG  Tablet, Oral) Active. hydroCHLOROthiazide (25MG  Tablet, Oral) Active. Cetirizine HCl (10MG  Tablet, Oral) Active. Medications Reconciled  Social History Megan Lane, [place]; 04/14/2019 3:13 PM) No alcohol use No caffeine use No drug use Tobacco use Former smoker.  Family History Megan Lane, [name]; 04/14/2019 3:13 PM) Arthritis Mother. Diabetes Mellitus Mother. Heart Disease Brother. Respiratory Condition Sister.  Pregnancy / Birth History Megan Lane, [place]; 04/14/2019 3:13 PM) Megan Lane 2 Maternal Lane <15 Para 2  Other Problems (Megan Lane, [place]; 04/14/2019 3:13 PM) Arthritis Diabetes Mellitus     Review of Systems (Megan Lane; 04/14/2019 3:13 PM) General Not Present-  Appetite Loss, Chills, Fatigue, Fever, Night Sweats, Weight Gain and Weight Loss. Skin Not Present- Change in Wart/Mole, Dryness, Hives, Jaundice, New Lesions, Non-Healing Wounds, Rash and Ulcer. HEENT Present- Wears glasses/contact lenses. Not Present- Earache, Hearing Loss, Hoarseness, Nose Bleed, Oral Ulcers, Ringing in the Ears, Seasonal Allergies, Sinus Pain, Sore Throat, Visual Disturbances and Yellow Eyes. Respiratory Present- Snoring. Not Present- Bloody sputum, Chronic Cough, Difficulty Breathing and Wheezing. Breast Not Present- Breast Mass, Breast Pain, Nipple Discharge and Skin Changes. Cardiovascular Present- Leg Cramps. Not Present- Chest Pain, Difficulty Breathing Lying Down, Palpitations, Rapid Heart Rate, Shortness of Breath and Swelling of Extremities. Gastrointestinal Not Present- Abdominal Pain, Bloating, Bloody Stool, Change in Bowel Habits, Chronic diarrhea, Constipation, Difficulty Swallowing, Excessive gas, Gets full quickly at meals, Hemorrhoids, Indigestion, Nausea, Rectal Pain and Vomiting. Female Genitourinary Present- Urgency. Not Present- Frequency, Nocturia, Painful Urination and Pelvic Pain. Musculoskeletal Not Present- Back Pain, Joint Pain, Joint Stiffness, Muscle Pain, Muscle Weakness and Swelling of Extremities. Neurological Not Present- Decreased Memory, Fainting, Headaches, Numbness, Seizures, Tingling, Tremor, Trouble walking and Weakness. Psychiatric Not Present- Anxiety, Bipolar, Change in Sleep Pattern, Depression, Fearful and Frequent crying. Endocrine Not Present- Cold Intolerance, Excessive Hunger, Hair Changes, Heat Intolerance, Hot flashes and New Diabetes.  Vitals (Megan Lane; 04/14/2019 3:14 PM) 04/14/2019 3:14 PM Weight: 242.25 lb Height: 72in Body Surface Area: 2.31 m Body Mass Index: 32.85 kg/m  Temp.: 98.3F  Pulse: 109 (Regular)  BP: 122/70 (Sitting, Left Arm, Standard)        Physical Exam (Megan Lane; 04/14/2019  4:01 PM)  General Mental Status-Alert. General Appearance-Consistent with stated Lane. Hydration-Well hydrated. Voice-Normal.  Eye Eyeball - Bilateral-Extraocular movements intact. Sclera/Conjunctiva - Bilateral-No scleral icterus.  Chest and Lung Exam Note: CTA WOB NORMAL  Cardiovascular Note: NSR NO JVD  Abdomen Note:  Reducible supraumbilical hernia noted. It measures about 4 cm in maximal diameter. No redness or drainage. Otherwise soft and nontender  Neurologic Neurologic evaluation reveals -alert and oriented x 3 with no impairment of recent or remote memory. Mental Status-Normal.  Musculoskeletal Normal Exam - Left-Upper Extremity Strength Normal and Lower Extremity Strength Normal. Normal Exam - Right-Upper Extremity Strength Normal and Lower Extremity Strength Normal.    Assessment & Plan (Megan Lane; 04/14/2019 4:01 PM)  REDUCIBLE BULGE OF ABDOMINAL WALL (R19.00) Impression: TOTAL TIME 30 examination, document review, documentation, chart review, x-ray review and discussion of treatment options with risks and benefits   UMBILICAL HERNIA WITHOUT OBSTRUCTION AND WITHOUT GANGRENE (K42.9) Impression: larger than 2017 recommend CT to further evaluate and determine surgical options The risk of hernia repair include bleeding, infection, organ injury, bowel injury, bladder injury, nerve injury recurrent hernia, blood clots, worsening of underlying condition, chronic pain, mesh use, open surgery, death, and the need for other operattions. Pt agrees to proceed  Current Plans Pt Education - CCS Free Text Education/Instructions: discussed with patient and provided information. Pt Education - Pamphlet Given - Hernia Surgery: discussed with patient and provided information. CCS Consent - Hernia Repair - Ventral/Incisional/Umbilical (Gross): discussed with patient and provided information. The anatomy & physiology of the abdominal wall and pelvic  floor was discussed. The pathophysiology of hernias in the inguinal and pelvic region was discussed. Natural history risks such as progressive enlargement, pain, incarceration, and strangulation was discussed. Contributors to complications such as smoking, obesity, diabetes, prior surgery, etc were discussed.  I feel the risks of no intervention will lead to serious problems that outweigh the operative risks; therefore, I recommended surgery to reduce and repair the hernia. I explained laparoscopic techniques with possible need for an open approach. I noted usual use of mesh to patch and/or buttress hernia repair  Risks such as bleeding, infection, abscess, need for further treatment, heart attack, death, and other risks were discussed. I noted a good likelihood this will help address the problem. Goals of post-operative recovery were discussed as well. Possibility that this will not correct all symptoms was explained. I stressed the importance of low-impact activity, aggressive pain control, avoiding constipation, & not pushing through pain to minimize risk of post-operative chronic pain or injury. Possibility of reherniation was discussed. We will work to minimize complications.  An educational handout further explaining the pathology & treatment options was given as well. Questions were answered. The patient expresses understanding & wishes to proceed with surgery.  Pt Education - CCS Mesh education: discussed with patient and provided information.

## 2019-04-16 ENCOUNTER — Other Ambulatory Visit: Payer: Self-pay | Admitting: Surgery

## 2019-04-16 DIAGNOSIS — R1900 Intra-abdominal and pelvic swelling, mass and lump, unspecified site: Secondary | ICD-10-CM

## 2019-04-24 ENCOUNTER — Other Ambulatory Visit: Payer: Self-pay

## 2019-04-24 ENCOUNTER — Ambulatory Visit
Admission: RE | Admit: 2019-04-24 | Discharge: 2019-04-24 | Disposition: A | Discharge status: Home / Self Care | Payer: BC Managed Care – PPO | Source: Ambulatory Visit | Attending: Surgery | Admitting: Surgery

## 2019-04-24 DIAGNOSIS — K760 Fatty (change of) liver, not elsewhere classified: Secondary | ICD-10-CM | POA: Diagnosis not present

## 2019-04-24 DIAGNOSIS — R1900 Intra-abdominal and pelvic swelling, mass and lump, unspecified site: Secondary | ICD-10-CM

## 2019-04-24 DIAGNOSIS — K439 Ventral hernia without obstruction or gangrene: Secondary | ICD-10-CM | POA: Diagnosis not present

## 2019-04-24 IMAGING — CT CT ABD-PELV W/ CM
1 of 3 series · 13 of 32 positions shown, 19 images · IV contrast (APPLIED)
Comparison: None.

CLINICAL DATA: Central abdominal pain and swelling. Suspected
hernia.

Creatinine was obtained on site at [HOSPITAL] at [HOSPITAL].
Results: Creatinine 1.3 mg/dL.
EXAM:
CT ABDOMEN AND PELVIS WITH CONTRAST
TECHNIQUE: Multidetector CT imaging of the abdomen and pelvis was performed
using the standard protocol following bolus administration of
intravenous contrast.
CONTRAST:  100mL [7B] IOPAMIDOL ([7B]) INJECTION 61%

[Series 2: abd/pelvis w/cm · axial · 0.83mm/px · z∈[-464,-34]mm · 13 of 102 slices shown, 19 images]
[im 8/102  soft-tissue]
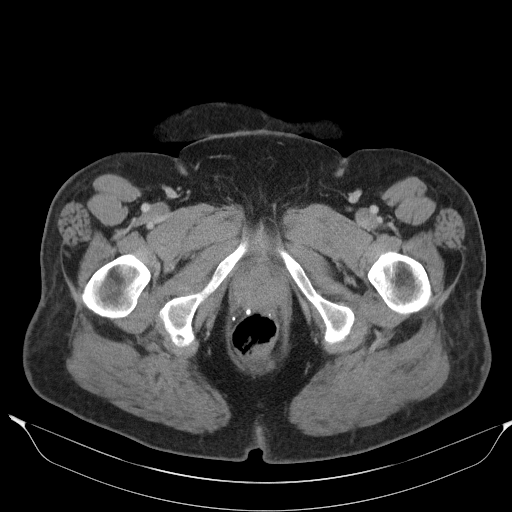
[im 8/102  bone]
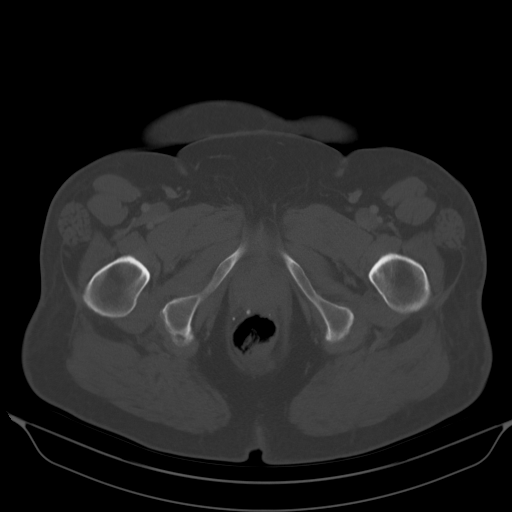
[im 15/102  soft-tissue]
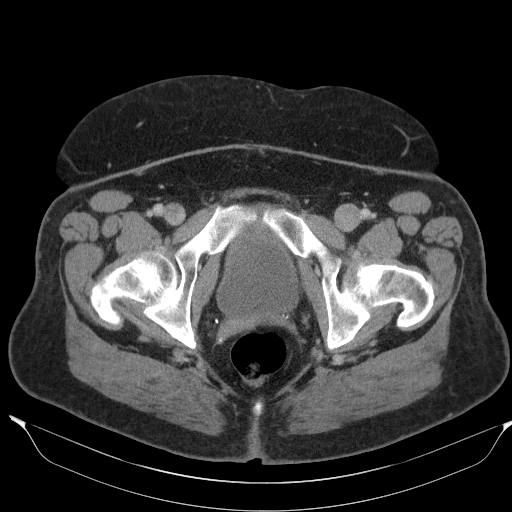
[im 22/102  soft-tissue]
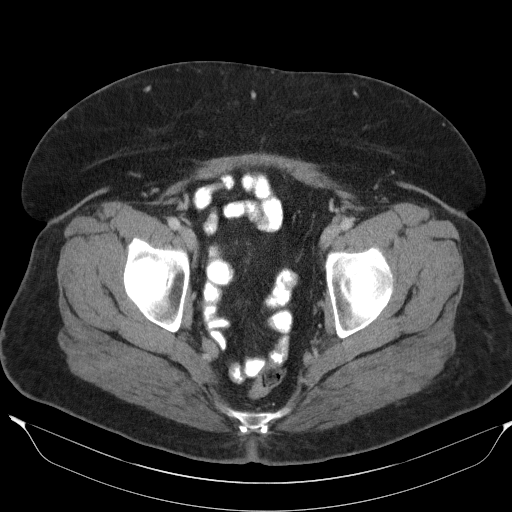
[im 29/102  soft-tissue]
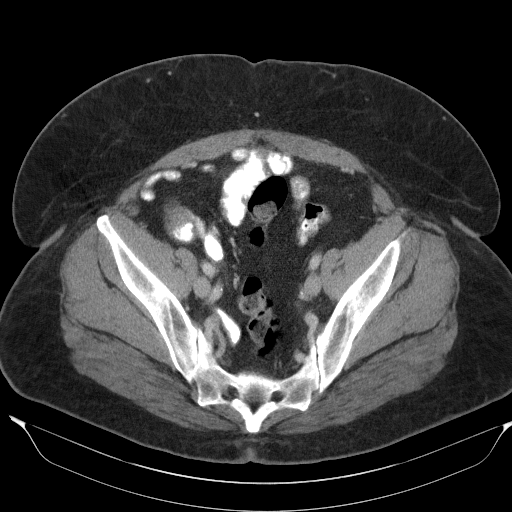
[im 37/102  soft-tissue]
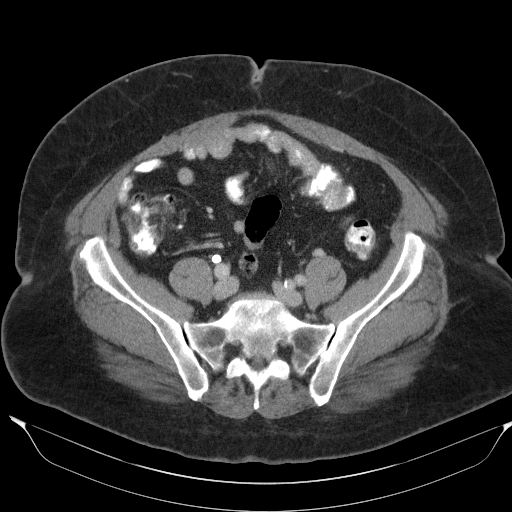
[im 44/102  soft-tissue]
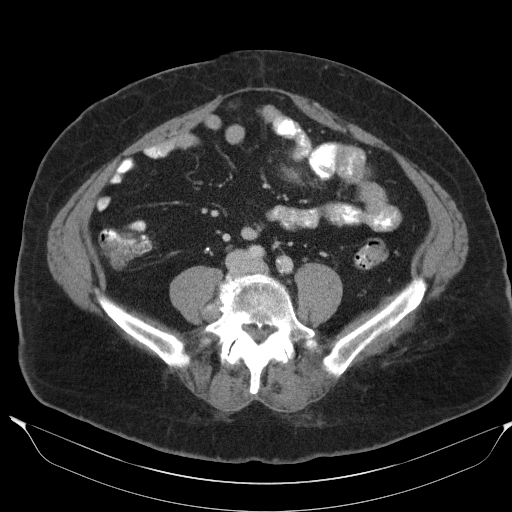
[im 51/102  soft-tissue]
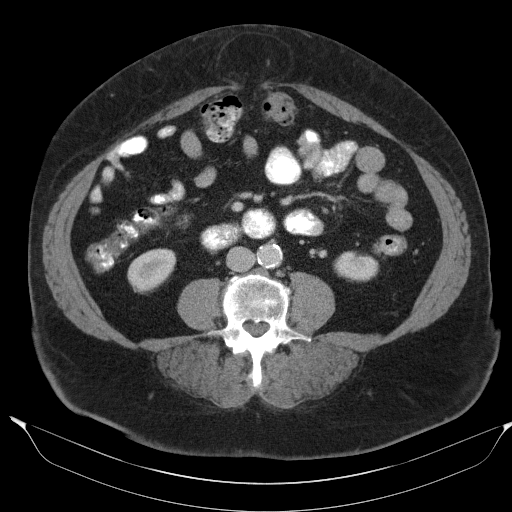
[im 58/102  soft-tissue]
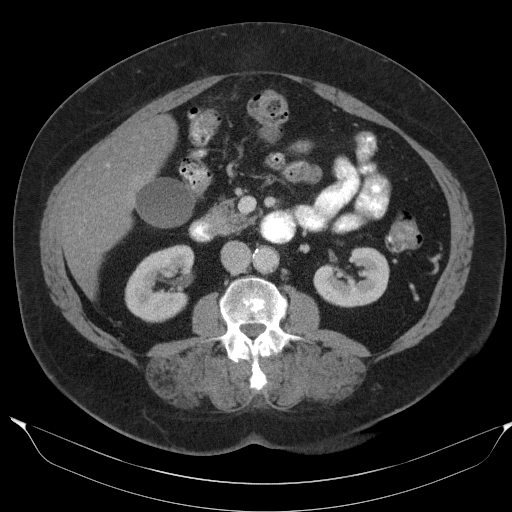
[im 65/102  soft-tissue]
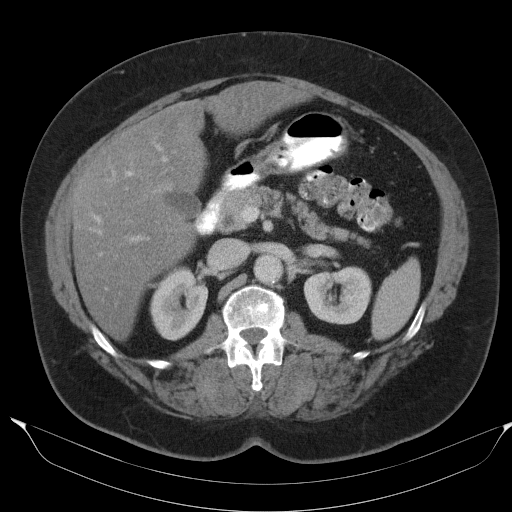
[im 65/102  bone]
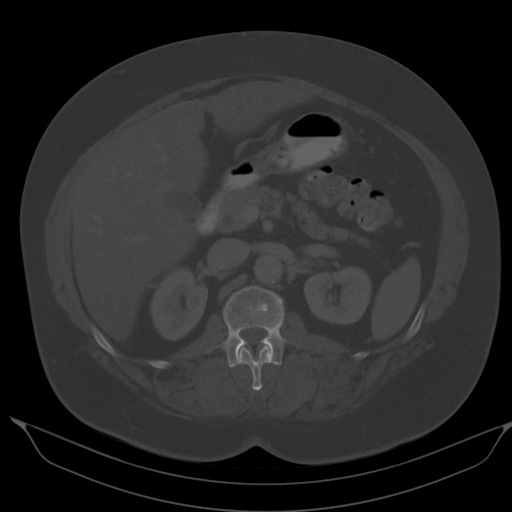
[im 73/102  soft-tissue]
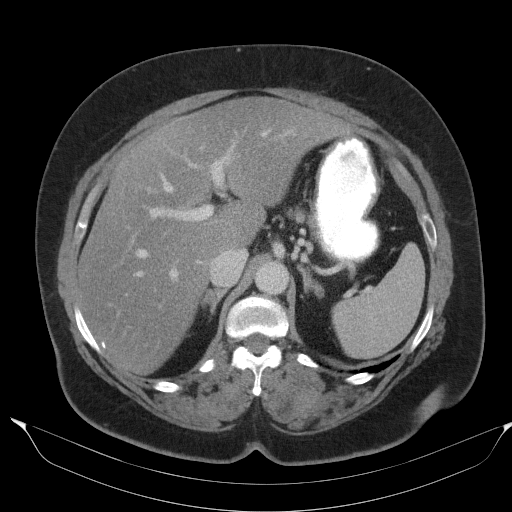
[im 73/102  lung]
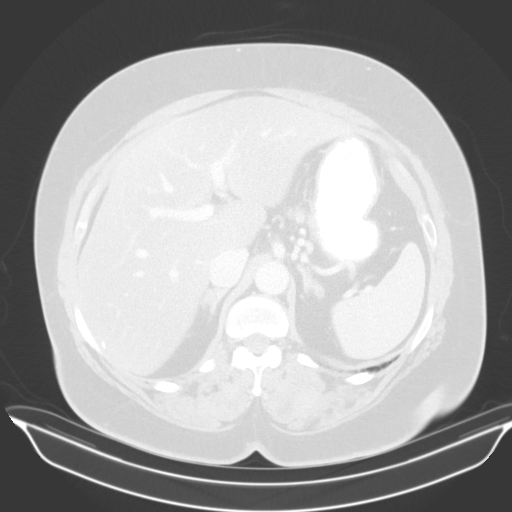
[im 80/102  soft-tissue]
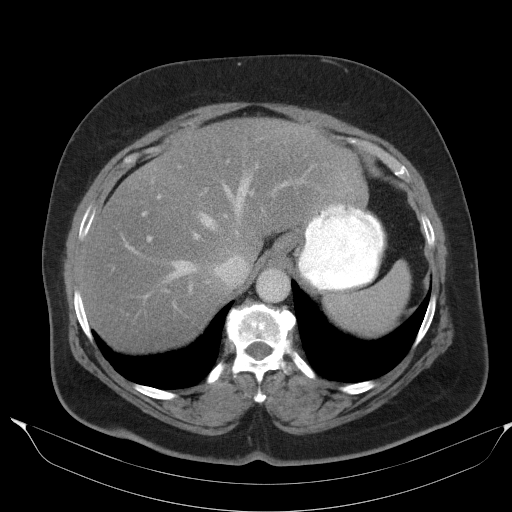
[im 80/102  lung]
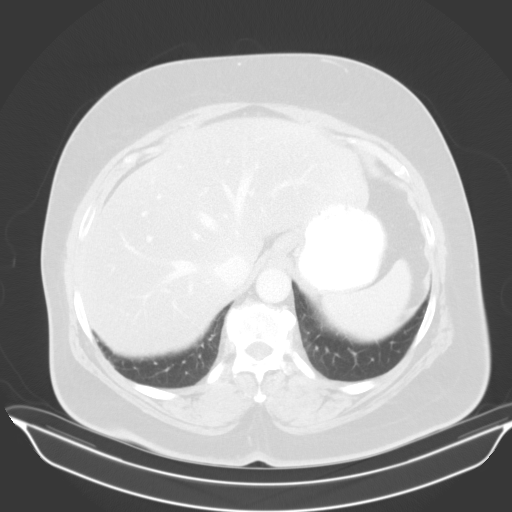
[im 87/102  soft-tissue]
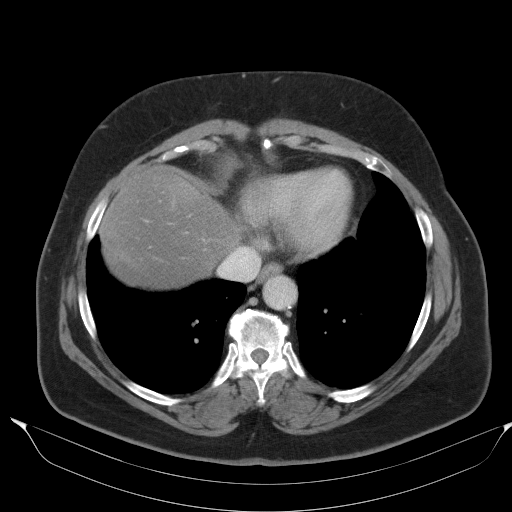
[im 87/102  lung]
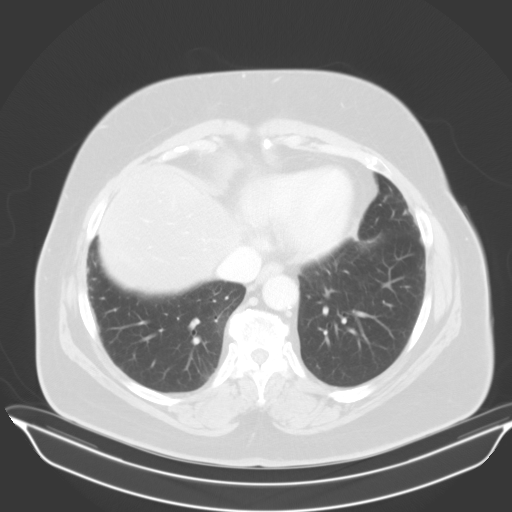
[im 94/102  soft-tissue]
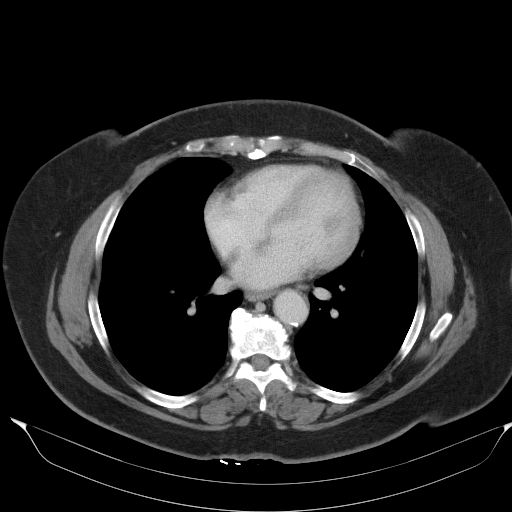
[im 94/102  lung]
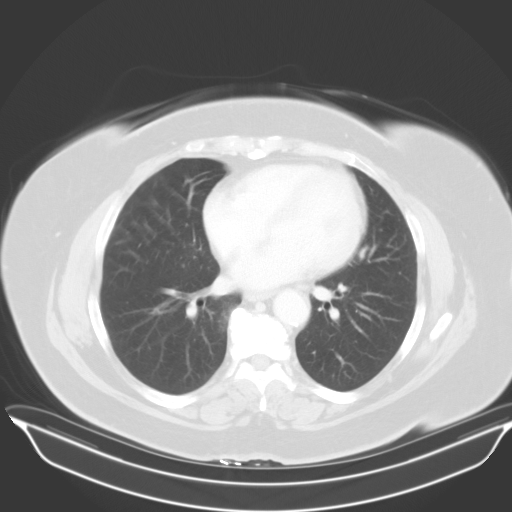

[13 of 32 positions shown; findings below may reference images not displayed]

FINDINGS: Lower Chest: No acute findings.

Hepatobiliary: No hepatic masses identified. Moderate to severe
diffuse hepatic steatosis noted. Gallbladder is unremarkable. No
evidence of biliary ductal dilatation.

Pancreas:  No mass or inflammatory changes.

Spleen: Within normal limits in size and appearance.

Adrenals/Urinary Tract: No masses identified. No evidence of
ureteral calculi or hydronephrosis.

Stomach/Bowel: No evidence of obstruction, inflammatory process or
abnormal fluid collections.

Vascular/Lymphatic: No pathologically enlarged lymph nodes. No
abdominal aortic aneurysm. Aortic atherosclerosis incidentally
noted.

Reproductive: Prior hysterectomy noted. Adnexal regions are
unremarkable in appearance.

Other: A small to moderate midline ventral hernia is seen just above
the umbilicus which contains only omental fat. No evidence of
herniated bowel loops. No evidence of other abdominal wall soft
tissue mass or fluid collection.

Musculoskeletal:  No suspicious bone lesions identified.
IMPRESSION: 1. Small to moderate midline ventral hernia containing only omental
fat.
2. Moderate to severe hepatic steatosis.

## 2019-04-24 MED ORDER — IOPAMIDOL (ISOVUE-300) INJECTION 61%
100.0000 mL | Freq: Once | INTRAVENOUS | Status: AC | PRN
  Administered 2019-04-24: 16:00:00 100 mL via INTRAVENOUS

## 2019-04-25 ENCOUNTER — Ambulatory Visit: Payer: Self-pay | Admitting: Surgery

## 2019-05-13 ENCOUNTER — Ambulatory Visit: Payer: BC Managed Care – PPO | Attending: Internal Medicine

## 2019-05-13 DIAGNOSIS — Z23 Encounter for immunization: Secondary | ICD-10-CM

## 2019-05-13 NOTE — Progress Notes (Signed)
   Covid-19 Vaccination Clinic  Name:  Megan Lane    MRN: [id number] DOB: 08/04/1955  05/13/2019  Megan Lane was observed post Covid-19 immunization for 15 minutes without incident. She was provided with Vaccine Information Sheet and instruction to access the V-Safe system.   Megan Lane was instructed to call 911 with any severe reactions post vaccine: Marland Kitchen Difficulty breathing  . Swelling of face and throat  . A fast heartbeat  . A bad rash all over body  . Dizziness and weakness   Immunizations Administered    Name Date Dose VIS Date Route   Pfizer COVID-19 Vaccine 05/13/2019  1:55 PM 0.3 mL 01/17/2019 Intramuscular   Manufacturer: [place]   Lot: [id number]   [place]: [id number]

## 2019-05-21 ENCOUNTER — Inpatient Hospital Stay (HOSPITAL_COMMUNITY)
Admission: RE | Admit: 2019-05-21 | Discharge: 2019-05-21 | Disposition: A | Discharge status: Home / Self Care | Payer: BC Managed Care – PPO | Source: Ambulatory Visit

## 2019-05-21 ENCOUNTER — Encounter (HOSPITAL_COMMUNITY): Payer: Self-pay

## 2019-05-21 NOTE — Progress Notes (Signed)
Left message for patient who did not arrive for our 1300 appt in pre-admission testing

## 2019-05-21 NOTE — Progress Notes (Signed)
[place] ([place]), [place] - [address] [place] ([place]) [place] [address] Phone: [phone] Fax: [phone]      Your procedure is scheduled on Thursday, 05/29/2019.  Report to [facility] Main Entrance "A" at 08:00 A.M., and check in at the Admitting office.  Call this number if you have problems the morning of surgery:  [phone]  Call [phone] if you have any questions prior to your surgery date Monday-Friday 8am-4pm    Remember:  Do not eat after midnight the night before your surgery  You may drink clear liquids until 07:00 the morning of your surgery.   Clear liquids allowed are: Water, Non-Citrus Juices (without pulp), Carbonated Beverages, Clear Tea, Black Coffee Only, and Gatorade    Take these medicines the morning of surgery with A SIP OF WATER: Acetaminophen (Tylenol) - if needed Atorvastatin (Lipitor) Gabapentin (Neurontin)   As of today, STOP taking any Diclofenac (Voltaren) gel, Aspirin (unless otherwise instructed by your surgeon) and Aspirin containing products, Aleve, Naproxen, Ibuprofen, Motrin, Advil, Goody's, BC's, all herbal medications, fish oil, and all vitamins.    WHAT DO I DO ABOUT MY DIABETES MEDICATION?  [place] Kitchen The DAY BEFORE surgery, take your usual doses of Metformin (Glucophage)  . The DAY BEFORE surgery, take MORNING and LUNCH doses of Glipizide (Glucotrol), DO NOT take an evening dose if you take one  . Do not take oral diabetes medicines (pills) the morning of surgery.    HOW TO MANAGE YOUR DIABETES BEFORE AND AFTER SURGERY  Why is it important to control my blood sugar before and after surgery? . Improving blood sugar levels before and after surgery helps healing and can limit problems. . A way of improving blood sugar control is eating a healthy diet by: o  Eating less sugar and carbohydrates o  Increasing activity/exercise o  Talking with your doctor about reaching your blood  sugar goals . High blood sugars (greater than 180 mg/dL) can raise your risk of infections and slow your recovery, so you will need to focus on controlling your diabetes during the weeks before surgery. . Make sure that the doctor who takes care of your diabetes knows about your planned surgery including the date and location.  How do I manage my blood sugar before surgery? . Check your blood sugar at least 4 times a day, starting 2 days before surgery, to make sure that the level is not too high or low. . Check your blood sugar the morning of your surgery when you wake up and every 2 hours until you get to the Short Stay unit. o If your blood sugar is less than 70 mg/dL, you will need to treat for low blood sugar: - Do not take insulin. - Treat a low blood sugar (less than 70 mg/dL) with  cup of clear juice (cranberry or apple), 4 glucose tablets, OR glucose gel. - Recheck blood sugar in 15 minutes after treatment (to make sure it is greater than 70 mg/dL). If your blood sugar is not greater than 70 mg/dL on recheck, call [phone] for further instructions. . Report your blood sugar to the short stay nurse when you get to Short Stay.  . If you are admitted to the hospital after surgery: o Your blood sugar will be checked by the staff and you will probably be given insulin after surgery (instead of oral diabetes medicines) to make sure you have good blood sugar levels. o The goal for blood  sugar control after surgery is 80-180 mg/dL.                       Do not wear jewelry, make up, or nail polish            Do not wear lotions, powders, perfumes/colognes, or deodorant.            Do not shave 48 hours prior to surgery.  Men may shave face and neck.            Do not bring valuables to the hospital.            [facility] is not responsible for any belongings or valuables.  Do NOT Smoke (Tobacco/Vapping) or drink Alcohol 24 hours prior to your procedure  If you use a CPAP at night,  you may bring all equipment for your overnight stay.   Contacts, glasses, dentures or bridgework may not be worn into surgery.      For patients admitted to the hospital, discharge time will be determined by your treatment team.   Patients discharged the day of surgery will not be allowed to drive home, and someone needs to stay with them for 24 hours.    Special instructions:   [place]- Preparing For Surgery  Before surgery, you can play an important role. Because skin is not sterile, your skin needs to be as free of germs as possible. You can reduce the number of germs on your skin by washing with CHG (chlorahexidine gluconate) Soap before surgery.  CHG is an antiseptic cleaner which kills germs and bonds with the skin to continue killing germs even after washing.    Oral Hygiene is also important to reduce your risk of infection.  Remember - BRUSH YOUR TEETH THE MORNING OF SURGERY WITH YOUR REGULAR TOOTHPASTE  Please do not use if you have an allergy to CHG or antibacterial soaps. If your skin becomes reddened/irritated stop using the CHG.  Do not shave (including legs and underarms) for at least 48 hours prior to first CHG shower. It is OK to shave your face.  Please follow these instructions carefully.   1. Shower the NIGHT BEFORE SURGERY and the MORNING OF SURGERY with CHG Soap.   2. If you chose to wash your hair, wash your hair first as usual with your normal shampoo.  3. After you shampoo, rinse your hair and body thoroughly to remove the shampoo.  4. Use CHG as you would any other liquid soap. You can apply CHG directly to the skin and wash gently with a scrungie or a clean washcloth.   5. Apply the CHG Soap to your body ONLY FROM THE NECK DOWN.  Do not use on open wounds or open sores. Avoid contact with your eyes, ears, mouth and genitals (private parts). Wash Face and genitals (private parts)  with your normal soap.   6. Wash thoroughly, paying special attention to  the area where your surgery will be performed.  7. Thoroughly rinse your body with warm water from the neck down.  8. DO NOT shower/wash with your normal soap after using and rinsing off the CHG Soap.  9. Pat yourself dry with a CLEAN TOWEL.  10. Wear CLEAN PAJAMAS to bed the night before surgery, wear comfortable clothes the morning of surgery  11. Place CLEAN SHEETS on your bed the night of your first shower and DO NOT SLEEP WITH PETS.   Day of Surgery:  Do not apply any deodorants/lotions.  Please wear clean clothes to the hospital/surgery center.   Remember to brush your teeth WITH YOUR REGULAR TOOTHPASTE.   Please read over the following fact sheets that you were given.

## 2019-05-22 ENCOUNTER — Encounter (HOSPITAL_COMMUNITY)
Admission: RE | Admit: 2019-05-22 | Discharge: 2019-05-22 | Disposition: A | Discharge status: Home / Self Care | Payer: BC Managed Care – PPO | Source: Ambulatory Visit | Attending: Surgery | Admitting: Surgery

## 2019-05-22 ENCOUNTER — Other Ambulatory Visit: Payer: Self-pay

## 2019-05-22 ENCOUNTER — Encounter (HOSPITAL_COMMUNITY): Payer: Self-pay

## 2019-05-22 DIAGNOSIS — Z01818 Encounter for other preprocedural examination: Secondary | ICD-10-CM | POA: Diagnosis not present

## 2019-05-22 DIAGNOSIS — E119 Type 2 diabetes mellitus without complications: Secondary | ICD-10-CM | POA: Diagnosis not present

## 2019-05-22 HISTORY — DX: Ventral hernia without obstruction or gangrene: K43.9

## 2019-05-22 HISTORY — DX: Presence of spectacles and contact lenses: Z97.3

## 2019-05-22 HISTORY — DX: Unspecified asthma, uncomplicated: J45.909

## 2019-05-22 HISTORY — DX: Pure hypercholesterolemia, unspecified: E78.00

## 2019-05-22 LAB — CBC WITH DIFFERENTIAL/PLATELET
Abs Immature Granulocytes: 0.02 10*3/uL (ref 0.00–0.07)
Basophils Absolute: 0.1 10*3/uL (ref 0.0–0.1)
Basophils Relative: 1 %
Eosinophils Absolute: 0.2 10*3/uL (ref 0.0–0.5)
Eosinophils Relative: 3 %
HCT: 38.2 % (ref 36.0–46.0)
Hemoglobin: 12.2 g/dL (ref 12.0–15.0)
Immature Granulocytes: 0 %
Lymphocytes Relative: 48 %
Lymphs Abs: 2.7 10*3/uL (ref 0.7–4.0)
MCH: 27.7 pg (ref 26.0–34.0)
MCHC: 31.9 g/dL (ref 30.0–36.0)
MCV: 86.8 fL (ref 80.0–100.0)
Monocytes Absolute: 0.3 10*3/uL (ref 0.1–1.0)
Monocytes Relative: 6 %
Neutro Abs: 2.4 10*3/uL (ref 1.7–7.7)
Neutrophils Relative %: 42 %
Platelets: 298 10*3/uL (ref 150–400)
RBC: 4.4 MIL/uL (ref 3.87–5.11)
RDW: 14.3 % (ref 11.5–15.5)
WBC: 5.7 10*3/uL (ref 4.0–10.5)
nRBC: 0 % (ref 0.0–0.2)

## 2019-05-22 LAB — COMPREHENSIVE METABOLIC PANEL
ALT: 29 U/L (ref 0–44)
AST: 25 U/L (ref 15–41)
Albumin: 3.6 g/dL (ref 3.5–5.0)
Alkaline Phosphatase: 95 U/L (ref 38–126)
Anion gap: 11 (ref 5–15)
BUN: 15 mg/dL (ref 8–23)
CO2: 26 mmol/L (ref 22–32)
Calcium: 9.1 mg/dL (ref 8.9–10.3)
Chloride: 105 mmol/L (ref 98–111)
Creatinine, Ser: 1.03 mg/dL — ABNORMAL HIGH (ref 0.44–1.00)
GFR calc Af Amer: >60 mL/min (ref >60)
GFR calc non Af Amer: 58 mL/min — ABNORMAL LOW (ref >60)
Glucose, Bld: 174 mg/dL — ABNORMAL HIGH (ref 70–99)
Potassium: 3.6 mmol/L (ref 3.5–5.1)
Sodium: 142 mmol/L (ref 135–145)
Total Bilirubin: 0.5 mg/dL (ref 0.3–1.2)
Total Protein: 6.8 g/dL (ref 6.5–8.1)

## 2019-05-22 LAB — HEMOGLOBIN A1C
Hgb A1c MFr Bld: 9 % — ABNORMAL HIGH (ref 4.8–5.6)
Mean Plasma Glucose: 211.6 mg/dL

## 2019-05-22 LAB — GLUCOSE, CAPILLARY: Glucose-Capillary: 135 mg/dL — ABNORMAL HIGH (ref 70–99)

## 2019-05-22 NOTE — Progress Notes (Signed)
Pt denies SOB, chest pain, and being under the care of a cardiologist. Pt stated that Dr. [name] is her PCP. Pt stated that a stress test was performed > 10 years ago but denies having a cardiac cath. Pt stated that an A1c was done 3 months ago. Pt denies any recent labs. Pt denies having an EKG and chest x ray in the last year. Pt reminded to quarantine. Pt verbalized understanding of all pre-op instructions.

## 2019-05-22 NOTE — Progress Notes (Signed)
Pt chart forwarded to PA, Anesthesiology, to review abnormal  HbA1c .

## 2019-05-23 ENCOUNTER — Other Ambulatory Visit (HOSPITAL_COMMUNITY): Payer: BC Managed Care – PPO

## 2019-05-23 NOTE — Anesthesia Preprocedure Evaluation (Addendum)
Anesthesia Evaluation  Patient identified by MRN, date of birth, ID band Patient awake    Reviewed: Allergy & Precautions, NPO status , Patient's Chart, lab work & pertinent test results  History of Anesthesia Complications Negative for: history of anesthetic complications  Airway Mallampati: II  TM Distance: >3 FB Neck ROM: Full    Dental  (+) Dental Advisory Given, Upper Dentures, Partial Lower   Pulmonary asthma , former smoker,    Pulmonary exam normal breath sounds clear to auscultation       Cardiovascular hypertension, Pt. on medications (-) angina(-) CAD and (-) Past MI Normal cardiovascular exam Rhythm:Regular Rate:Normal     Neuro/Psych  Headaches, negative psych ROS   GI/Hepatic Neg liver ROS, Ventral hernia   Endo/Other  diabetes, Type 2, Oral Hypoglycemic AgentsObesity   Renal/GU negative Renal ROS     Musculoskeletal negative musculoskeletal ROS (+)   Abdominal   Peds  Hematology negative hematology ROS (+)   Anesthesia Other Findings Day of surgery medications reviewed with the patient.  Reproductive/Obstetrics                          Anesthesia Physical Anesthesia Plan  ASA: II  Anesthesia Plan: General   Post-op Pain Management:    Induction: Intravenous  PONV Risk Score and Plan: 4 or greater and Midazolam, Dexamethasone, Ondansetron and Diphenhydramine  Airway Management Planned: Oral ETT  Additional Equipment:   Intra-op Plan:   Post-operative Plan: Extubation in OR  Informed Consent: I have reviewed the patients History and Physical, chart, labs and discussed the procedure including the risks, benefits and alternatives for the proposed anesthesia with the patient or authorized representative who has indicated his/her understanding and acceptance.     Dental advisory given  Plan Discussed with: CRNA  Anesthesia Plan Comments: (Uncontrolled DMII. A1c  9.0 on preop labs. Result called to Dr. Josetta Huddle office )       Anesthesia Quick Evaluation

## 2019-05-26 ENCOUNTER — Other Ambulatory Visit (HOSPITAL_COMMUNITY)
Admission: RE | Admit: 2019-05-26 | Discharge: 2019-05-26 | Disposition: A | Discharge status: Home / Self Care | Payer: BC Managed Care – PPO | Source: Ambulatory Visit | Attending: Surgery | Admitting: Surgery

## 2019-05-26 DIAGNOSIS — Z01812 Encounter for preprocedural laboratory examination: Secondary | ICD-10-CM | POA: Insufficient documentation

## 2019-05-26 DIAGNOSIS — Z20822 Contact with and (suspected) exposure to covid-19: Secondary | ICD-10-CM | POA: Diagnosis not present

## 2019-05-26 LAB — SARS CORONAVIRUS 2 (TAT 6-24 HRS): SARS Coronavirus 2: NEGATIVE

## 2019-05-29 ENCOUNTER — Ambulatory Visit (HOSPITAL_COMMUNITY): Payer: BC Managed Care – PPO | Admitting: Certified Registered Nurse Anesthetist

## 2019-05-29 ENCOUNTER — Encounter (HOSPITAL_COMMUNITY)
Admission: RE | Disposition: A | Discharge status: Home / Self Care | Payer: Self-pay | Source: Home / Self Care | Attending: Surgery

## 2019-05-29 ENCOUNTER — Ambulatory Visit (HOSPITAL_COMMUNITY)
Admission: RE | Admit: 2019-05-29 | Discharge: 2019-05-29 | Disposition: A | Discharge status: Home / Self Care | Payer: BC Managed Care – PPO | Attending: Surgery | Admitting: Surgery

## 2019-05-29 ENCOUNTER — Ambulatory Visit (HOSPITAL_COMMUNITY): Payer: BC Managed Care – PPO | Admitting: Physician Assistant

## 2019-05-29 ENCOUNTER — Encounter (HOSPITAL_COMMUNITY): Payer: Self-pay | Admitting: Surgery

## 2019-05-29 ENCOUNTER — Other Ambulatory Visit: Payer: Self-pay

## 2019-05-29 DIAGNOSIS — Z79899 Other long term (current) drug therapy: Secondary | ICD-10-CM | POA: Diagnosis not present

## 2019-05-29 DIAGNOSIS — J45909 Unspecified asthma, uncomplicated: Secondary | ICD-10-CM | POA: Insufficient documentation

## 2019-05-29 DIAGNOSIS — E669 Obesity, unspecified: Secondary | ICD-10-CM | POA: Insufficient documentation

## 2019-05-29 DIAGNOSIS — E119 Type 2 diabetes mellitus without complications: Secondary | ICD-10-CM | POA: Insufficient documentation

## 2019-05-29 DIAGNOSIS — Z87891 Personal history of nicotine dependence: Secondary | ICD-10-CM | POA: Diagnosis not present

## 2019-05-29 DIAGNOSIS — I10 Essential (primary) hypertension: Secondary | ICD-10-CM | POA: Diagnosis not present

## 2019-05-29 DIAGNOSIS — Z7984 Long term (current) use of oral hypoglycemic drugs: Secondary | ICD-10-CM | POA: Insufficient documentation

## 2019-05-29 DIAGNOSIS — Z6832 Body mass index (BMI) 32.0-32.9, adult: Secondary | ICD-10-CM | POA: Diagnosis not present

## 2019-05-29 DIAGNOSIS — K439 Ventral hernia without obstruction or gangrene: Secondary | ICD-10-CM | POA: Diagnosis not present

## 2019-05-29 HISTORY — PX: VENTRAL HERNIA REPAIR: SHX424

## 2019-05-29 LAB — GLUCOSE, CAPILLARY
Glucose-Capillary: 228 mg/dL — ABNORMAL HIGH (ref 70–99)
Glucose-Capillary: 216 mg/dL — ABNORMAL HIGH (ref 70–99)

## 2019-05-29 SURGERY — REPAIR, HERNIA, VENTRAL
Anesthesia: General | Site: Abdomen

## 2019-05-29 MED ORDER — 0.9 % SODIUM CHLORIDE (POUR BTL) OPTIME
TOPICAL | Status: DC | PRN
  Administered 2019-05-29: 1000 mL

## 2019-05-29 MED ORDER — MIDAZOLAM HCL 5 MG/5ML IJ SOLN
INTRAMUSCULAR | Status: DC | PRN
  Administered 2019-05-29: 2 mg via INTRAVENOUS

## 2019-05-29 MED ORDER — CHLORHEXIDINE GLUCONATE CLOTH 2 % EX PADS: 6.0000 | MEDICATED_PAD | Freq: Once | CUTANEOUS | Status: DC

## 2019-05-29 MED ORDER — ROCURONIUM BROMIDE 50 MG/5ML IV SOSY
PREFILLED_SYRINGE | INTRAVENOUS | Status: DC | PRN
  Administered 2019-05-29: 60 mg via INTRAVENOUS

## 2019-05-29 MED ORDER — MIDAZOLAM HCL 2 MG/2ML IJ SOLN
INTRAMUSCULAR | Status: AC
  Filled 2019-05-29: qty 2

## 2019-05-29 MED ORDER — PROPOFOL 10 MG/ML IV BOLUS
INTRAVENOUS | Status: DC | PRN
  Administered 2019-05-29: 180 mg via INTRAVENOUS

## 2019-05-29 MED ORDER — ONDANSETRON HCL 4 MG/2ML IJ SOLN
INTRAMUSCULAR | Status: DC | PRN
  Administered 2019-05-29: 4 mg via INTRAVENOUS

## 2019-05-29 MED ORDER — LACTATED RINGERS IV SOLN: INTRAVENOUS | Status: DC

## 2019-05-29 MED ORDER — FENTANYL CITRATE (PF) 100 MCG/2ML IJ SOLN
INTRAMUSCULAR | Status: DC | PRN
  Administered 2019-05-29: 100 ug via INTRAVENOUS
  Administered 2019-05-29: 50 ug via INTRAVENOUS

## 2019-05-29 MED ORDER — PROPOFOL 10 MG/ML IV BOLUS
INTRAVENOUS | Status: AC
  Filled 2019-05-29: qty 20

## 2019-05-29 MED ORDER — CLINDAMYCIN PHOSPHATE 900 MG/50ML IV SOLN
900.0000 mg | INTRAVENOUS | Status: AC
  Administered 2019-05-29: 10:00:00 900 mg via INTRAVENOUS
  Filled 2019-05-29: qty 50

## 2019-05-29 MED ORDER — PHENYLEPHRINE HCL (PRESSORS) 10 MG/ML IV SOLN
INTRAVENOUS | Status: DC | PRN
  Administered 2019-05-29: 80 ug via INTRAVENOUS

## 2019-05-29 MED ORDER — HYDROCODONE-ACETAMINOPHEN 5-325 MG PO TABS: 1.0000 | ORAL_TABLET | Freq: Four times a day (QID) | ORAL | 0 refills | Status: DC | PRN

## 2019-05-29 MED ORDER — FENTANYL CITRATE (PF) 250 MCG/5ML IJ SOLN
INTRAMUSCULAR | Status: AC
  Filled 2019-05-29: qty 5

## 2019-05-29 MED ORDER — PHENYLEPHRINE 40 MCG/ML (10ML) SYRINGE FOR IV PUSH (FOR BLOOD PRESSURE SUPPORT)
PREFILLED_SYRINGE | INTRAVENOUS | Status: AC
  Filled 2019-05-29: qty 10

## 2019-05-29 MED ORDER — DIPHENHYDRAMINE HCL 50 MG/ML IJ SOLN
INTRAMUSCULAR | Status: DC | PRN
  Administered 2019-05-29: 12.5 mg via INTRAVENOUS

## 2019-05-29 MED ORDER — FENTANYL CITRATE (PF) 100 MCG/2ML IJ SOLN: 25.0000 ug | INTRAMUSCULAR | Status: DC | PRN

## 2019-05-29 MED ORDER — CELECOXIB 200 MG PO CAPS
200.0000 mg | ORAL_CAPSULE | ORAL | Status: AC
  Administered 2019-05-29: 200 mg via ORAL
  Filled 2019-05-29: qty 1

## 2019-05-29 MED ORDER — ALBUTEROL SULFATE HFA 108 (90 BASE) MCG/ACT IN AERS
INHALATION_SPRAY | RESPIRATORY_TRACT | Status: DC | PRN
  Administered 2019-05-29: 6 via RESPIRATORY_TRACT

## 2019-05-29 MED ORDER — ACETAMINOPHEN 500 MG PO TABS
1000.0000 mg | ORAL_TABLET | ORAL | Status: AC
  Administered 2019-05-29: 1000 mg via ORAL
  Filled 2019-05-29: qty 2

## 2019-05-29 MED ORDER — PROMETHAZINE HCL 25 MG/ML IJ SOLN: 6.2500 mg | INTRAMUSCULAR | Status: DC | PRN

## 2019-05-29 MED ORDER — ONDANSETRON HCL 4 MG/2ML IJ SOLN
INTRAMUSCULAR | Status: AC
  Filled 2019-05-29: qty 2

## 2019-05-29 MED ORDER — BUPIVACAINE HCL 0.25 % IJ SOLN
INTRAMUSCULAR | Status: DC | PRN
  Administered 2019-05-29: 20 mL

## 2019-05-29 MED ORDER — SUGAMMADEX SODIUM 200 MG/2ML IV SOLN
INTRAVENOUS | Status: DC | PRN
  Administered 2019-05-29: 200 mg via INTRAVENOUS

## 2019-05-29 MED ORDER — LIDOCAINE 2% (20 MG/ML) 5 ML SYRINGE
INTRAMUSCULAR | Status: DC | PRN
  Administered 2019-05-29: 100 mg via INTRAVENOUS

## 2019-05-29 SURGICAL SUPPLY — 43 items
ADH SKN CLS APL DERMABOND .7 (GAUZE/BANDAGES/DRESSINGS) ×1
APL PRP STRL LF DISP 70% ISPRP (MISCELLANEOUS) ×1
BINDER BREAST LRG (GAUZE/BANDAGES/DRESSINGS) ×2 IMPLANT
BLADE CLIPPER SURG (BLADE) IMPLANT
CANISTER SUCT 3000ML PPV (MISCELLANEOUS) ×3 IMPLANT
CHLORAPREP W/TINT 26 (MISCELLANEOUS) ×3 IMPLANT
COVER SURGICAL LIGHT HANDLE (MISCELLANEOUS) ×3 IMPLANT
COVER WAND RF STERILE (DRAPES) ×1 IMPLANT
DERMABOND ADVANCED (GAUZE/BANDAGES/DRESSINGS) ×2
DERMABOND ADVANCED .7 DNX12 (GAUZE/BANDAGES/DRESSINGS) ×1 IMPLANT
DRAPE LAPAROSCOPIC ABDOMINAL (DRAPES) ×3 IMPLANT
ELECT REM PT RETURN 9FT ADLT (ELECTROSURGICAL) ×3
ELECTRODE REM PT RTRN 9FT ADLT (ELECTROSURGICAL) ×1 IMPLANT
GLOVE BIO SURGEON STRL SZ8 (GLOVE) ×3 IMPLANT
GLOVE BIOGEL PI IND STRL 8 (GLOVE) ×1 IMPLANT
GLOVE BIOGEL PI INDICATOR 8 (GLOVE) ×2
GOWN STRL REUS W/ TWL LRG LVL3 (GOWN DISPOSABLE) ×3 IMPLANT
GOWN STRL REUS W/ TWL XL LVL3 (GOWN DISPOSABLE) ×1 IMPLANT
GOWN STRL REUS W/TWL LRG LVL3 (GOWN DISPOSABLE) ×6
GOWN STRL REUS W/TWL XL LVL3 (GOWN DISPOSABLE) ×3
KIT BASIN OR (CUSTOM PROCEDURE TRAY) ×3 IMPLANT
KIT TURNOVER KIT B (KITS) ×3 IMPLANT
MESH VENTRALEX ST 8CM LRG (Mesh General) ×2 IMPLANT
NDL HYPO 25GX1X1/2 BEV (NEEDLE) ×1 IMPLANT
NEEDLE HYPO 25GX1X1/2 BEV (NEEDLE) ×3 IMPLANT
NS IRRIG 1000ML POUR BTL (IV SOLUTION) ×3 IMPLANT
PACK GENERAL/GYN (CUSTOM PROCEDURE TRAY) ×3 IMPLANT
PAD ARMBOARD 7.5X6 YLW CONV (MISCELLANEOUS) ×3 IMPLANT
PENCIL SMOKE EVACUATOR (MISCELLANEOUS) ×3 IMPLANT
STAPLER VISISTAT 35W (STAPLE) IMPLANT
SUT MNCRL AB 4-0 PS2 18 (SUTURE) ×3 IMPLANT
SUT NOVA 1 T20/GS 25DT (SUTURE) IMPLANT
SUT NOVA NAB DX-16 0-1 5-0 T12 (SUTURE) ×4 IMPLANT
SUT PDS AB 1 TP1 96 (SUTURE) IMPLANT
SUT PROLENE 1 CT (SUTURE) IMPLANT
SUT VIC AB 3-0 54X BRD REEL (SUTURE) IMPLANT
SUT VIC AB 3-0 BRD 54 (SUTURE)
SUT VIC AB 3-0 SH 27 (SUTURE) ×3
SUT VIC AB 3-0 SH 27XBRD (SUTURE) ×1 IMPLANT
SYR CONTROL 10ML LL (SYRINGE) ×3 IMPLANT
TOWEL GREEN STERILE (TOWEL DISPOSABLE) ×1 IMPLANT
TOWEL GREEN STERILE FF (TOWEL DISPOSABLE) ×3 IMPLANT
TRAY FOLEY MTR SLVR 14FR STAT (SET/KITS/TRAYS/PACK) IMPLANT

## 2019-05-29 NOTE — Interval H&P Note (Signed)
History and Physical Interval Note:  05/29/2019 9:15 AM  Megan Lane  has presented today for surgery, with the diagnosis of HERNIA.  The various methods of treatment have been discussed with the patient and family. After consideration of risks, benefits and other options for treatment, the patient has consented to  Procedure(s): [place] (N/A) as a surgical intervention.  The patient's history has been reviewed, patient examined, no change in status, stable for surgery.  I have reviewed the patient's chart and labs.  Questions were answered to the patient's satisfaction.     [place]

## 2019-05-29 NOTE — H&P (Signed)
Megan Lane Documented: Location: [facility] Patient #: [id number] DOB: 04-Jul-1955 Single / Language: [name] / Race: Black or African American Female  History of Present Illness  Patient words: Patient returns to clinic for follow-up of umbilical hernia. She seen in 2017 but was lost to follow-up. The bulge at her umbilicus is now more supraumbilical causing pain. It is larger than was before. She has pain with exertion or straining. No nausea or vomiting or change in bowel or bladder function.  The patient is a 64 year old female.   Past Surgical History No pertinent past surgical history  Diagnostic Studies History Colonoscopy >10 years ago Mammogram 1-3 years ago  Allergies Penicillin G Procaine *PENICILLINS* Allergies Reconciled  Medication HistoryAtorvastatin Calcium (20MG  Tablet, Oral) Active. MetFORMIN HCl (1000MG  Tablet, Oral) Active. Methocarbamol (500MG  Tablet, Oral) Active. Gabapentin (300MG  Capsule, Oral) Active. glipiZIDE (10MG  Tablet, Oral) Active. hydroCHLOROthiazide (25MG  Tablet, Oral) Active. Cetirizine HCl (10MG  Tablet, Oral) Active. Medications Reconciled  Social History  No alcohol use No caffeine use No drug use Tobacco use Former smoker.  Family History () Arthritis Mother. Diabetes Mellitus Mother. Heart Disease Brother. Respiratory Condition Sister.  Pregnancy / Birth History (Gravida 2 Maternal age <15 Para 2  Other Problems () Arthritis Diabetes Mellitus     Review of Systems General Not Present- Appetite Loss, Chills, Fatigue, Fever, Night Sweats, Weight Gain and Weight Loss. Skin Not Present- Change in Wart/Mole, Dryness, Hives, Jaundice, New Lesions, Non-Healing Wounds, Rash and Ulcer. HEENT Present- Wears glasses/contact lenses. Not Present- Earache, Hearing Loss, Hoarseness, Nose Bleed, Oral Ulcers, Ringing in the Ears, Seasonal Allergies, Sinus Pain, Sore Throat,  Visual Disturbances and Yellow Eyes. Respiratory Present- Snoring. Not Present- Bloody sputum, Chronic Cough, Difficulty Breathing and Wheezing. Breast Not Present- Breast Mass, Breast Pain, Nipple Discharge and Skin Changes. Cardiovascular Present- Leg Cramps. Not Present- Chest Pain, Difficulty Breathing Lying Down, Palpitations, Rapid Heart Rate, Shortness of Breath and Swelling of Extremities. Gastrointestinal Not Present- Abdominal Pain, Bloating, Bloody Stool, Change in Bowel Habits, Chronic diarrhea, Constipation, Difficulty Swallowing, Excessive gas, Gets full quickly at meals, Hemorrhoids, Indigestion, Nausea, Rectal Pain and Vomiting. Female Genitourinary Present- Urgency. Not Present- Frequency, Nocturia, Painful Urination and Pelvic Pain. Musculoskeletal Not Present- Back Pain, Joint Pain, Joint Stiffness, Muscle Pain, Muscle Weakness and Swelling of Extremities. Neurological Not Present- Decreased Memory, Fainting, Headaches, Numbness, Seizures, Tingling, Tremor, Trouble walking and Weakness. Psychiatric Not Present- Anxiety, Bipolar, Change in Sleep Pattern, Depression, Fearful and Frequent crying. Endocrine Not Present- Cold Intolerance, Excessive Hunger, Hair Changes, Heat Intolerance, Hot flashes and New Diabetes.  Vitals  04/14/2019 3:14 PM Weight: 242.25 lb Height: 72in Body Surface Area: 2.31 m Body Mass Index: 32.85 kg/m  Temp.: 98.13F  Pulse: 109 (Regular)  BP: 122/70 (Sitting, Left Arm, Standard)        Physical Exam  General Mental Status-Alert. General Appearance-Consistent with stated age. Hydration-Well hydrated. Voice-Normal.  Eye Eyeball - Bilateral-Extraocular movements intact. Sclera/Conjunctiva - Bilateral-No scleral icterus.  Chest and Lung Exam Note: CTA WOB NORMAL  Cardiovascular Note: NSR NO JVD  Abdomen Note: Reducible supraumbilical hernia noted. It measures about 4 cm in maximal diameter. No  redness or drainage. Otherwise soft and nontender  Neurologic Neurologic evaluation reveals -alert and oriented x 3 with no impairment of recent or remote memory. Mental Status-Normal.  Musculoskeletal Normal Exam - Left-Upper Extremity Strength Normal and Lower Extremity Strength Normal. Normal Exam - Right-Upper Extremity Strength Normal and Lower Extremity Strength Normal.    Assessment & Plan REDUCIBLE  BULGE OF ABDOMINAL WALL (R19.00) Impression: TOTAL TIME 30 examination, document review, documentation, chart review, x-ray review and discussion of treatment options with risks and benefits   UMBILICAL HERNIA WITHOUT OBSTRUCTION AND WITHOUT GANGRENE (K42.9) Impression: larger than 2017 recommend CT to further evaluate and determine surgical options The risk of hernia repair include bleeding, infection, organ injury, bowel injury, bladder injury, nerve injury recurrent hernia, blood clots, worsening of underlying condition, chronic pain, mesh use, open surgery, death, and the need for other operattions. Pt agrees to proceed  Current Plans Pt Education - CCS Free Text Education/Instructions: discussed with patient and provided information. Pt Education - Pamphlet Given - Hernia Surgery: discussed with patient and provided information. CCS Consent - Hernia Repair - Ventral/Incisional/Umbilical (Gross): discussed with patient and provided information. The anatomy & physiology of the abdominal wall and pelvic floor was discussed. The pathophysiology of hernias in the inguinal and pelvic region was discussed. Natural history risks such as progressive enlargement, pain, incarceration, and strangulation was discussed. Contributors to complications such as smoking, obesity, diabetes, prior surgery, etc were discussed.  I feel the risks of no intervention will lead to serious problems that outweigh the operative risks; therefore, I recommended surgery to reduce and repair  the hernia. I explained laparoscopic techniques with possible need for an open approach. I noted usual use of mesh to patch and/or buttress hernia repair  Risks such as bleeding, infection, abscess, need for further treatment, heart attack, death, and other risks were discussed. I noted a good likelihood this will help address the problem. Goals of post-operative recovery were discussed as well. Possibility that this will not correct all symptoms was explained. I stressed the importance of low-impact activity, aggressive pain control, avoiding constipation, & not pushing through pain to minimize risk of post-operative chronic pain or injury. Possibility of reherniation was discussed. We will work to minimize complications.  An educational handout further explaining the pathology & treatment options was given as well. Questions were answered. The patient expresses understanding & wishes to proceed with surgery.  Pt Education - CCS Mesh education: discussed with patient and provided information.

## 2019-05-29 NOTE — Anesthesia Procedure Notes (Signed)
Procedure Name: Intubation Date/Time: 05/29/2019 9:53 AM Performed by: Inda Coke, CRNA Pre-anesthesia Checklist: Patient identified, Emergency Drugs available, Suction available and Patient being monitored Patient Re-evaluated:Patient Re-evaluated prior to induction Oxygen Delivery Method: Circle System Utilized Preoxygenation: Pre-oxygenation with 100% oxygen Induction Type: IV induction Ventilation: Mask ventilation without difficulty and Oral airway inserted - appropriate to patient size Laryngoscope Size: Mac and 3 Grade View: Grade I Tube type: Oral Tube size: 7.0 mm Number of attempts: 1 Airway Equipment and Method: Stylet and Oral airway Placement Confirmation: ETT inserted through vocal cords under direct vision,  positive ETCO2 and breath sounds checked- equal and bilateral Secured at: 22 cm Tube secured with: Tape Dental Injury: Teeth and Oropharynx as per pre-operative assessment

## 2019-05-29 NOTE — Transfer of Care (Signed)
Immediate Anesthesia Transfer of Care Note  Patient: Megan Lane  Procedure(s) Performed: REPAIR OF VENTRAL HERNIA WITH MESH (N/A Abdomen)  Patient Location: PACU  Anesthesia Type:General  Level of Consciousness: awake and alert   Airway & Oxygen Therapy: Patient Spontanous Breathing and Patient connected to nasal cannula oxygen  Post-op Assessment: Report given to RN and Post -op Vital signs reviewed and stable  Post vital signs: Reviewed and stable  Last Vitals:  Vitals Value Taken Time  BP 110/69 05/29/19 1140  Temp 36.7 C 05/29/19 1140  Pulse 74 05/29/19 1152  Resp 18 05/29/19 1152  SpO2 92 % 05/29/19 1152  Vitals shown include unvalidated device data.  Last Pain:  Vitals:   05/29/19 1140  PainSc: 0-No pain      Patients Stated Pain Goal: 5 ([id number])  Complications: No apparent anesthesia complications

## 2019-05-29 NOTE — Op Note (Signed)
Preoperative diagnosis: 2 cm reducible ventral hernia  Postoperative diagnosis: Same  Procedure: Repair of ventral hernia with mesh  Surgeon: Erroll Luna, MD  Anesthesia: General with 0.25% Marcaine plain  EBL: Minimal  Drains: None  Specimen: None  Indications for procedure: The patient is a 64 year old female with a bulge above her umbilicus.  CT scan showed a 2 cm ventral hernia.  We discussed options of laparoscopic repair, open repair and the use of mesh.  Risk and benefits as well as recovery and long-term expectations were discussed.  She opted for open repair with mesh.The risk of hernia repair include bleeding,  Infection,   Recurrence of the hernia,  Mesh use, chronic pain,  Organ injury,  Bowel injury,  Bladder injury,   nerve injury with numbness around the incision,  Death,  and worsening of preexisting  medical problems.  The alternatives to surgery have been discussed as well..  Long term expectations of both operative and non operative treatments have been discussed.   The patient agrees to proceed.  Description of procedure: The patient was met in holding area and questions were answered.  She was then taken back to the operating.  She is placed supine upon the OR table.  After induction of general esthesia, the abdomen was prepped and draped in sterile fashion timeout performed.  0.25% Marcaine was infiltrated around the hernia.  This was approximately 2 fingerbreadths above the umbilicus.  Incision was made.  Dissection was carried down a very large hernia sac was identified.  This was dissected circumferentially around the fascial defect which was approximately 2 cm.  This reduced easily back into the abdominal cavity.  I swept under the fascia in the preperitoneal space and developed a plane for approximately 4 cm in all direction.  Her umbilicus was examined.  There was some weakness there and I placed a figure-of-eight suture around the umbilicus.  8 cm mesh was brought on  the field.  This was a ventral light coated circular mesh.  This was placed in the preperitoneal space and deployed.  Sutures were placed circumferentially on the mesh and secured to the undersurface of the fascia in a subfascial position.  There were no gaps between the sutures of mesh.  I then closed the fascia with figure-of-eight #1 Novafil sutures.  Irrigation used local anesthetic infiltrated circumferentially around the repair.  The subcu fat was closed with 3-0 Vicryl.  4 Monocryl was used to close the skin in a subcuticular fashion.  Dermabond applied.  All counts found to be correct.  The patient was awoke extubated taken recovery in satisfactory condition.

## 2019-05-29 NOTE — Discharge Instructions (Signed)
[facility] _______[facility] ° °UMBILICAL OR INGUINAL HERNIA REPAIR: POST OP INSTRUCTIONS ° °Always review your discharge instruction sheet given to you by the facility where your surgery was performed. °IF YOU HAVE DISABILITY OR FAMILY LEAVE FORMS, YOU MUST BRING THEM TO THE OFFICE FOR PROCESSING.   °DO NOT GIVE THEM TO YOUR DOCTOR. ° °1. A  prescription for pain medication may be given to you upon discharge.  Take your pain medication as prescribed, if needed.  If narcotic pain medicine is not needed, then you may take acetaminophen (Tylenol) or ibuprofen (Advil) as needed. °2. Take your usually prescribed medications unless otherwise directed. °If you need a refill on your pain medication, please contact your pharmacy.  They will contact our office to request authorization. Prescriptions will not be filled after 5 pm or on week-ends. °3. You should follow a light diet the first 24 hours after arrival home, such as soup and crackers, etc.  Be sure to include lots of fluids daily.  Resume your normal diet the day after surgery. °4.Most patients will experience some swelling and bruising around the umbilicus or in the groin and scrotum.  Ice packs and reclining will help.  Swelling and bruising can take several days to resolve.  °6. It is common to experience some constipation if taking pain medication after surgery.  Increasing fluid intake and taking a stool softener (such as Colace) will usually help or prevent this problem from occurring.  A mild laxative (Milk of Magnesia or Miralax) should be taken according to package directions if there are no bowel movements after 48 hours. °7. Unless discharge instructions indicate otherwise, you may remove your bandages 24-48 hours after surgery, and you may shower at that time.  You may have steri-strips (small skin tapes) in place directly over the incision.  These strips should be left on the skin for 7-10 days.  If your surgeon used skin glue on the  incision, you may shower in 24 hours.  The glue will flake off over the next 2-3 weeks.  Any sutures or staples will be removed at the office during your follow-up visit. °8. ACTIVITIES:  You may resume regular (light) daily activities beginning the next day--such as daily self-care, walking, climbing stairs--gradually increasing activities as tolerated.  You may have sexual intercourse when it is comfortable.  Refrain from any heavy lifting or straining until approved by your doctor. ° °a.You may drive when you are no longer taking prescription pain medication, you can comfortably wear a seatbelt, and you can safely maneuver your car and apply brakes. °b.RETURN TO WORK:   °_____________________________________________ ° °9.You should see your doctor in the office for a follow-up appointment approximately 2-3 weeks after your surgery.  Make sure that you call for this appointment within a day or two after you arrive home to insure a convenient appointment time. °10.OTHER INSTRUCTIONS: _________________________ °   _____________________________________ ° °WHEN TO CALL YOUR DOCTOR: °1. Fever over 101.0 °2. Inability to urinate °3. Nausea and/or vomiting °4. Extreme swelling or bruising °5. Continued bleeding from incision. °6. Increased pain, redness, or drainage from the incision ° °The clinic staff is available to answer your questions during regular business hours.  Please don’t hesitate to call and ask to speak to one of the nurses for clinical concerns.  If you have a medical emergency, go to the nearest emergency room or call 911.  A surgeon from [facility] is always on call at the hospital ° ° °  [address], [address], [place], [place]  [address] ? ° [address] [address], [place], [place]   [address] °[phone] ? [phone] ? FAX [phone] °Web site: [email or web address] °

## 2019-05-29 NOTE — Anesthesia Postprocedure Evaluation (Signed)
Anesthesia Post Note  Patient: Megan Lane  Procedure(s) Performed: REPAIR OF VENTRAL HERNIA WITH MESH (N/A Abdomen)     Patient location during evaluation: PACU Anesthesia Type: General Level of consciousness: awake and alert Pain management: pain level controlled Vital Signs Assessment: post-procedure vital signs reviewed and stable Respiratory status: spontaneous breathing, nonlabored ventilation and respiratory function stable Cardiovascular status: blood pressure returned to baseline and stable Postop Assessment: no apparent nausea or vomiting Anesthetic complications: no    Last Vitals:  Vitals:   05/29/19 1125 05/29/19 1140  BP: 113/73 110/69  Pulse: 70 65  Resp: 16 15  Temp:  36.7 C  SpO2: 91% 91%    Last Pain:  Vitals:   05/29/19 1140  PainSc: 0-No pain                 Catalina Gravel

## 2019-06-18 ENCOUNTER — Other Ambulatory Visit: Payer: Self-pay | Admitting: Family Medicine

## 2019-06-18 DIAGNOSIS — Z1231 Encounter for screening mammogram for malignant neoplasm of breast: Secondary | ICD-10-CM

## 2019-07-15 ENCOUNTER — Telehealth: Payer: Self-pay | Admitting: Family Medicine

## 2019-07-15 NOTE — Telephone Encounter (Signed)
Pt needs refill for methocarbamol (Robaxin) 500 mg muscle relaxer

## 2019-08-18 ENCOUNTER — Ambulatory Visit: Payer: BC Managed Care – PPO | Attending: Family Medicine | Admitting: Family Medicine

## 2019-08-18 ENCOUNTER — Other Ambulatory Visit: Payer: Self-pay

## 2019-08-18 ENCOUNTER — Encounter: Payer: Self-pay | Admitting: Family Medicine

## 2019-08-18 VITALS — BP 137/74 | HR 87 | Ht 72.0 in | Wt 244.0 lb

## 2019-08-18 DIAGNOSIS — I10 Essential (primary) hypertension: Secondary | ICD-10-CM | POA: Diagnosis not present

## 2019-08-18 DIAGNOSIS — M5431 Sciatica, right side: Secondary | ICD-10-CM

## 2019-08-18 DIAGNOSIS — E1140 Type 2 diabetes mellitus with diabetic neuropathy, unspecified: Secondary | ICD-10-CM | POA: Diagnosis not present

## 2019-08-18 DIAGNOSIS — M79601 Pain in right arm: Secondary | ICD-10-CM | POA: Diagnosis not present

## 2019-08-18 DIAGNOSIS — E782 Mixed hyperlipidemia: Secondary | ICD-10-CM

## 2019-08-18 LAB — POCT GLYCOSYLATED HEMOGLOBIN (HGB A1C): HbA1c, POC (controlled diabetic range): 9.3 % — AB (ref 0.0–7.0)

## 2019-08-18 LAB — GLUCOSE, POCT (MANUAL RESULT ENTRY): POC Glucose: 242 mg/dl — AB (ref 70–99)

## 2019-08-18 MED ORDER — DAPAGLIFLOZIN PROPANEDIOL 10 MG PO TABS: 10.0000 mg | ORAL_TABLET | Freq: Every day | ORAL | 6 refills | Status: DC

## 2019-08-18 MED ORDER — ATORVASTATIN CALCIUM 20 MG PO TABS: 20.0000 mg | ORAL_TABLET | Freq: Every day | ORAL | 6 refills | Status: DC

## 2019-08-18 MED ORDER — GLIPIZIDE 10 MG PO TABS: 10.0000 mg | ORAL_TABLET | Freq: Two times a day (BID) | ORAL | 6 refills | Status: DC

## 2019-08-18 MED ORDER — GABAPENTIN 300 MG PO CAPS: 600.0000 mg | ORAL_CAPSULE | Freq: Three times a day (TID) | ORAL | 6 refills | Status: DC

## 2019-08-18 MED ORDER — HYDROCHLOROTHIAZIDE 25 MG PO TABS: 25.0000 mg | ORAL_TABLET | Freq: Every day | ORAL | 6 refills | Status: DC

## 2019-08-18 MED ORDER — METFORMIN HCL 1000 MG PO TABS: 1000.0000 mg | ORAL_TABLET | Freq: Two times a day (BID) | ORAL | 6 refills | Status: DC

## 2019-08-18 NOTE — Patient Instructions (Signed)

## 2019-08-18 NOTE — Progress Notes (Signed)
Having numbness in her right arm.  Having pain in right hip.

## 2019-08-18 NOTE — Progress Notes (Signed)
Subjective:  Patient ID: Megan Lane, female    DOB: 1955/04/28  Age: 64 y.o. MRN: [id number]  CC: Diabetes   HPI Megan Lane is a 64 year old female with Type 2 Diabetes Mellitus (A1c 9.3), Diabetic neuropathy, Hyperlipidemia, Hypertension here for a follow-up visit. She complains of R arm numbness not relieved on Gabapentin. It feels like her upper arm is swollen and then it stops Also has R hip pain which radiated to her thigh and was relieved by taking Mustard. Pain is a 2-3/10 and described as dull.  With regards to her diabetes mellitus A1c is 9.3 which is up from 8.5 and she endorses compliance with glipizide and Metformin. Insurance did not cover [place] and she was switched to [place] which she has not been taking due to lack of insurance coverage as well and she is resistant to using an injectable. Sugars at home have been 119-130 fasting but she does not check her random sugars. She had her hernia surgery for umbilical hernia repair.  Compliant with her antihypertensive and her statin. Past Medical History:  Diagnosis Date  . Asthma    as a child  . Diabetes mellitus   . Hypercholesterolemia   . Hypertension   . Ventral hernia   . Wears glasses     Past Surgical History:  Procedure Laterality Date  . ABDOMINAL HYSTERECTOMY    . KNEE SURGERY    . LEG SURGERY     left lower extremity  . TUBAL LIGATION    . VENTRAL HERNIA REPAIR N/A 05/29/2019   Procedure: REPAIR OF VENTRAL HERNIA WITH MESH;  Surgeon: Erroll Luna, MD;  Location: [facility];  Service: General;  Laterality: N/A;    Family History  Problem Relation Age of Onset  . Diabetes Mother   . Hypertension Mother   . Stroke Mother   . Heart disease Brother   . Breast cancer Sister 39    Allergies  Allergen Reactions  . Penicillins Rash    Outpatient Medications Prior to Visit  Medication Sig Dispense Refill  . glucose blood test strip Use as instructed 100 each 12  . ibuprofen (ADVIL) 600  MG tablet Take 1 tablet (600 mg total) by mouth 3 (three) times daily. With food X 7 days then prn pain 60 tablet 2  . Lancets (ACCU-CHEK SOFT TOUCH) lancets Use as instructed 100 each 12  . methocarbamol (ROBAXIN) 500 MG tablet Take 2 tablets (1,000 mg total) by mouth 3 (three) times daily. X 7 days then prn muscle spasm (Patient taking differently: Take 1,000 mg by mouth at bedtime. ) 90 tablet 0  . Multiple Vitamins-Minerals (MULTIVITAMIN WITH MINERALS) tablet Take 1 tablet by mouth daily. Chewables    . atorvastatin (LIPITOR) 20 MG tablet Take 1 tablet (20 mg total) by mouth daily. 30 tablet 6  . gabapentin (NEURONTIN) 300 MG capsule Take 2 capsules (600 mg total) by mouth 3 (three) times daily. 180 capsule 6  . glipiZIDE (GLUCOTROL) 10 MG tablet Take 1 tablet (10 mg total) by mouth 2 (two) times daily before a meal. 60 tablet 6  . hydrochlorothiazide (HYDRODIURIL) 25 MG tablet Take 1 tablet (25 mg total) by mouth daily. 30 tablet 6  . metFORMIN (GLUCOPHAGE) 1000 MG tablet Take 1 tablet (1,000 mg total) by mouth 2 (two) times daily with a meal. 60 tablet 6  . acetaminophen (TYLENOL) 500 MG tablet Take 2 tablets (1,000 mg total) by mouth every 8 (eight) hours as needed for  moderate pain. 30 tablet 0  . diclofenac sodium (VOLTAREN) 1 % GEL Apply 2 g topically 4 (four) times daily. (Patient not taking: Reported on 08/18/2019) 100 g 0  . HYDROcodone-acetaminophen (NORCO/VICODIN) 5-325 MG tablet Take 1 tablet by mouth every 6 (six) hours as needed for moderate pain. (Patient not taking: Reported on 08/18/2019) 15 tablet 0  . sitaGLIPtin (JANUVIA) 100 MG tablet Take 1 tablet (100 mg total) by mouth daily. (Patient not taking: Reported on 05/13/2019) 30 tablet 6   No facility-administered medications prior to visit.     ROS Review of Systems  Constitutional: Negative for activity change, appetite change and fatigue.  HENT: Negative for congestion, sinus pressure and sore throat.   Eyes: Negative for  visual disturbance.  Respiratory: Negative for cough, chest tightness, shortness of breath and wheezing.   Cardiovascular: Negative for chest pain and palpitations.  Gastrointestinal: Negative for abdominal distention, abdominal pain and constipation.  Endocrine: Negative for polydipsia.  Genitourinary: Negative for dysuria and frequency.  Musculoskeletal:       See HPI  Skin: Negative for rash.  Neurological: Negative for tremors, light-headedness and numbness.  Hematological: Does not bruise/bleed easily.  Psychiatric/Behavioral: Negative for agitation and behavioral problems.    Objective:  BP 137/74   Pulse 87   Ht 6' (1.829 m)   Wt 244 lb (110.7 kg)   SpO2 98%   BMI 33.09 kg/m   BP/Weight 08/18/2019 05/29/2019 4/00/8676  Systolic BP 195 093 267  Diastolic BP 74 69 89  Wt. (Lbs) 244 239.2 239.19  BMI 33.09 32.44 32.44      Physical Exam Constitutional:      Appearance: She is well-developed.  Neck:     Vascular: No JVD.  Cardiovascular:     Rate and Rhythm: Normal rate.     Heart sounds: Normal heart sounds. No murmur heard.   Pulmonary:     Effort: Pulmonary effort is normal.     Breath sounds: Normal breath sounds. No wheezing or rales.  Chest:     Chest wall: No tenderness.  Abdominal:     General: Bowel sounds are normal. There is no distension.     Palpations: Abdomen is soft. There is no mass.     Tenderness: There is no abdominal tenderness.  Musculoskeletal:        General: Normal range of motion.     Right lower leg: No edema.     Left lower leg: No edema.  Neurological:     Mental Status: She is alert and oriented to person, place, and time.  Psychiatric:        Mood and Affect: Mood normal.     CMP Latest Ref Rng & Units 05/22/2019 09/03/2018 07/26/2017  Glucose 70 - 99 mg/dL 174(H) 156(H) 186(H)  BUN 8 - 23 mg/dL 15 18 10   Creatinine 0.44 - 1.00 mg/dL 1.03(H) 1.02(H) 0.84  Sodium 135 - 145 mmol/L 142 141 140  Potassium 3.5 - 5.1 mmol/L 3.6  3.9 4.1  Chloride 98 - 111 mmol/L 105 101 102  CO2 22 - 32 mmol/L 26 24 26   Calcium 8.9 - 10.3 mg/dL 9.1 9.6 9.4  Total Protein 6.5 - 8.1 g/dL 6.8 7.3 -  Total Bilirubin 0.3 - 1.2 mg/dL 0.5 <0.2 -  Alkaline Phos 38 - 126 U/L 95 113 -  AST 15 - 41 U/L 25 24 -  ALT 0 - 44 U/L 29 28 -    Lipid Panel     Component  Value Date/Time   CHOL 186 09/03/2018 1359   TRIG 219 (H) 09/03/2018 1359   HDL 48 09/03/2018 1359   CHOLHDL 3.9 09/03/2018 1359   CHOLHDL 4.3 10/16/2013 1019   VLDL 23 10/16/2013 1019   LDLCALC 94 09/03/2018 1359    CBC    Component Value Date/Time   WBC 5.7 05/22/2019 0951   RBC 4.40 05/22/2019 0951   HGB 12.2 05/22/2019 0951   HCT 38.2 05/22/2019 0951   PLT 298 05/22/2019 0951   MCV 86.8 05/22/2019 0951   MCH 27.7 05/22/2019 0951   MCHC 31.9 05/22/2019 0951   RDW 14.3 05/22/2019 0951   LYMPHSABS 2.7 05/22/2019 0951   MONOABS 0.3 05/22/2019 0951   EOSABS 0.2 05/22/2019 0951   BASOSABS 0.1 05/22/2019 0951    Lab Results  Component Value Date   HGBA1C 9.3 (A) 08/18/2019    Assessment & Plan:  1. Type 2 diabetes mellitus with diabetic neuropathy, without long-term current use of insulin (HCC) Uncontrolled with A1c of 9.3; goal is less than 7.0 She is not open to an injectable We will add Farxiga to her regimen Counseled on Diabetic diet, my plate method, 720 minutes of moderate intensity exercise/week Blood sugar logs with fasting goals of 80-120 mg/dl, random of less than 180 and in the event of sugars less than 60 mg/dl or greater than 400 mg/dl encouraged to notify the clinic. Advised on the need for annual eye exams, annual foot exams, Pneumonia vaccine. - POCT glucose (manual entry) - POCT glycosylated hemoglobin (Hb A1C) - Microalbumin / creatinine urine ratio - dapagliflozin propanediol (FARXIGA) 10 MG TABS tablet; Take 1 tablet (10 mg total) by mouth daily before breakfast.  Dispense: 30 tablet; Refill: 6 - gabapentin (NEURONTIN) 300 MG capsule;  Take 2 capsules (600 mg total) by mouth 3 (three) times daily.  Dispense: 180 capsule; Refill: 6 - glipiZIDE (GLUCOTROL) 10 MG tablet; Take 1 tablet (10 mg total) by mouth 2 (two) times daily before a meal.  Dispense: 60 tablet; Refill: 6 - metFORMIN (GLUCOPHAGE) 1000 MG tablet; Take 1 tablet (1,000 mg total) by mouth 2 (two) times daily with a meal.  Dispense: 60 tablet; Refill: 6 - Basic Metabolic Panel  2. Mixed hyperlipidemia Controlled Low-cholesterol diet - atorvastatin (LIPITOR) 20 MG tablet; Take 1 tablet (20 mg total) by mouth daily.  Dispense: 30 tablet; Refill: 6  3. Essential hypertension, benign Controlled Counseled on blood pressure goal of less than 130/80, low-sodium, DASH diet, medication compliance, 150 minutes of moderate intensity exercise per week. Discussed medication compliance, adverse effects. - hydrochlorothiazide (HYDRODIURIL) 25 MG tablet; Take 1 tablet (25 mg total) by mouth daily.  Dispense: 30 tablet; Refill: 6  4. Pain of right upper extremity Unknown etiology She is currently on gabapentin and a muscle relaxant I have suggested physical therapy however her work schedule precludes her from undergoing this  5. Right sciatic nerve pain Pain is minimal at this time She will use OTC analgesics in addition to her current regimen of muscle relaxant and ibuprofen    Meds ordered this encounter  Medications  . dapagliflozin propanediol (FARXIGA) 10 MG TABS tablet    Sig: Take 1 tablet (10 mg total) by mouth daily before breakfast.    Dispense:  30 tablet    Refill:  6  . atorvastatin (LIPITOR) 20 MG tablet    Sig: Take 1 tablet (20 mg total) by mouth daily.    Dispense:  30 tablet    Refill:  6  .  gabapentin (NEURONTIN) 300 MG capsule    Sig: Take 2 capsules (600 mg total) by mouth 3 (three) times daily.    Dispense:  180 capsule    Refill:  6  . glipiZIDE (GLUCOTROL) 10 MG tablet    Sig: Take 1 tablet (10 mg total) by mouth 2 (two) times daily before  a meal.    Dispense:  60 tablet    Refill:  6    Dose change  . hydrochlorothiazide (HYDRODIURIL) 25 MG tablet    Sig: Take 1 tablet (25 mg total) by mouth daily.    Dispense:  30 tablet    Refill:  6  . metFORMIN (GLUCOPHAGE) 1000 MG tablet    Sig: Take 1 tablet (1,000 mg total) by mouth 2 (two) times daily with a meal.    Dispense:  60 tablet    Refill:  6    Follow-up: Return in about 3 months (around 11/18/2019) for Chronic disease management.       [name], MD, FAAFP. [facility] and [place], [place]   08/18/2019, 5:56 PM

## 2019-08-19 LAB — BASIC METABOLIC PANEL
BUN/Creatinine Ratio: 12 (ref 12–28)
BUN: 13 mg/dL (ref 8–27)
CO2: 25 mmol/L (ref 20–29)
Calcium: 9.3 mg/dL (ref 8.7–10.3)
Chloride: 99 mmol/L (ref 96–106)
Creatinine, Ser: 1.08 mg/dL — ABNORMAL HIGH (ref 0.57–1.00)
GFR calc Af Amer: 64 mL/min/{1.73_m2} (ref >59)
GFR calc non Af Amer: 55 mL/min/{1.73_m2} — ABNORMAL LOW (ref >59)
Glucose: 228 mg/dL — ABNORMAL HIGH (ref 65–99)
Potassium: 3.9 mmol/L (ref 3.5–5.2)
Sodium: 140 mmol/L (ref 134–144)

## 2019-08-19 LAB — MICROALBUMIN / CREATININE URINE RATIO
Creatinine, Urine: 147.8 mg/dL
Microalb/Creat Ratio: 5 mg/g creat (ref 0–29)
Microalbumin, Urine: 7.7 ug/mL

## 2019-08-25 ENCOUNTER — Telehealth: Payer: Self-pay | Admitting: Family Medicine

## 2019-08-25 NOTE — Telephone Encounter (Signed)
Please f/u with patient if you called regarding her lab results.   Copied from [place] [phone]. Topic: General - Call Back - No Documentation >> Aug 25, 2019  2:45 PM Leonides Schanz, Ja-Kwan wrote: Reason for CRM: Pt stated she had a missed call from the office so she was just returning the call. Pt requests call back

## 2019-08-28 NOTE — Telephone Encounter (Signed)
Patient was called and given lab results from recent appointment.

## 2019-09-01 ENCOUNTER — Telehealth: Payer: Self-pay | Admitting: Family Medicine

## 2019-09-01 DIAGNOSIS — E782 Mixed hyperlipidemia: Secondary | ICD-10-CM

## 2019-09-01 DIAGNOSIS — E1140 Type 2 diabetes mellitus with diabetic neuropathy, unspecified: Secondary | ICD-10-CM

## 2019-09-01 DIAGNOSIS — I10 Essential (primary) hypertension: Secondary | ICD-10-CM

## 2019-09-01 NOTE — Telephone Encounter (Addendum)
[facility] called and spoke to [place], Education administrator about the refills requested. I advised they were sent on 08/18/19 and asked did they receive the refills. She says they did receive them. She says the Glipizide and Wilder Glade were picked up on 08/18/19, the Atorvastatin and Hydrochlorothiazide are on hold and she will refill them both. Patient called, left detailed VM about the above.

## 2019-09-01 NOTE — Telephone Encounter (Signed)
Pt went to visit family in [place] and left her medications there. atorvastatin (LIPITOR) 20 MG tablet glipiZIDE (GLUCOTROL) 10 MG tablet  hydrochlorothiazide (HYDRODIURIL) 25 MG tablet dapagliflozin propanediol (FARXIGA) 10 MG TABS tablet  Pt states she needs these meds and would like them sent to [place] ([place]),  - [address] Phone:  [phone]  Fax:  [phone]

## 2019-11-18 ENCOUNTER — Encounter: Payer: Self-pay | Admitting: Family Medicine

## 2019-11-18 ENCOUNTER — Other Ambulatory Visit: Payer: Self-pay

## 2019-11-18 ENCOUNTER — Ambulatory Visit: Payer: BC Managed Care – PPO | Attending: Family Medicine | Admitting: Family Medicine

## 2019-11-18 VITALS — BP 107/66 | HR 81 | Ht 72.0 in | Wt 235.0 lb

## 2019-11-18 DIAGNOSIS — E1140 Type 2 diabetes mellitus with diabetic neuropathy, unspecified: Secondary | ICD-10-CM

## 2019-11-18 DIAGNOSIS — Z23 Encounter for immunization: Secondary | ICD-10-CM | POA: Diagnosis not present

## 2019-11-18 DIAGNOSIS — E1159 Type 2 diabetes mellitus with other circulatory complications: Secondary | ICD-10-CM

## 2019-11-18 DIAGNOSIS — M5432 Sciatica, left side: Secondary | ICD-10-CM

## 2019-11-18 DIAGNOSIS — M5441 Lumbago with sciatica, right side: Secondary | ICD-10-CM

## 2019-11-18 DIAGNOSIS — E1169 Type 2 diabetes mellitus with other specified complication: Secondary | ICD-10-CM

## 2019-11-18 DIAGNOSIS — M5431 Sciatica, right side: Secondary | ICD-10-CM

## 2019-11-18 DIAGNOSIS — E785 Hyperlipidemia, unspecified: Secondary | ICD-10-CM

## 2019-11-18 DIAGNOSIS — I152 Hypertension secondary to endocrine disorders: Secondary | ICD-10-CM

## 2019-11-18 LAB — POCT GLYCOSYLATED HEMOGLOBIN (HGB A1C): HbA1c, POC (controlled diabetic range): 7.8 % — AB (ref 0.0–7.0)

## 2019-11-18 LAB — GLUCOSE, POCT (MANUAL RESULT ENTRY): POC Glucose: 125 mg/dl — AB (ref 70–99)

## 2019-11-18 NOTE — Progress Notes (Signed)
Having pain in lower back and down both legs.

## 2019-11-18 NOTE — Progress Notes (Signed)
Subjective:  Patient ID: Megan Lane, female    DOB: 08/29/1955  Age: 64 y.o. MRN: [id number]  CC: Diabetes   HPI Megan Lane is a 64year old female with Type 2 Diabetes Mellitus (A1c 7.8), Diabetic neuropathy, Hyperlipidemia, Hypertension here for a follow-up visit.  She complains of low back pain which radiates down bilateral lower extremities relieved with Ibuprofen and she sometimes uses an OTC cream which is beneficial.  Her diabetes has improved with an A1c of 7.8 which is down from 9.3 previously.  Wilder Glade was added at her last visit and she has cut out sodas and is adherent to regular exercise regimen. Doing well on her statin and antihypertensive. She has no additional concerns today.  Past Medical History:  Diagnosis Date  . Asthma    as a child  . Diabetes mellitus   . Hypercholesterolemia   . Hypertension   . Ventral hernia   . Wears glasses     Past Surgical History:  Procedure Laterality Date  . ABDOMINAL HYSTERECTOMY    . KNEE SURGERY    . LEG SURGERY     left lower extremity  . TUBAL LIGATION    . VENTRAL HERNIA REPAIR N/A 05/29/2019   Procedure: REPAIR OF VENTRAL HERNIA WITH MESH;  Surgeon: Erroll Luna, MD;  Location: [facility];  Service: General;  Laterality: N/A;    Family History  Problem Relation Age of Onset  . Diabetes Mother   . Hypertension Mother   . Stroke Mother   . Heart disease Brother   . Breast cancer Sister 27    Allergies  Allergen Reactions  . Penicillins Rash    Outpatient Medications Prior to Visit  Medication Sig Dispense Refill  . acetaminophen (TYLENOL) 500 MG tablet Take 2 tablets (1,000 mg total) by mouth every 8 (eight) hours as needed for moderate pain. 30 tablet 0  . atorvastatin (LIPITOR) 20 MG tablet Take 1 tablet (20 mg total) by mouth daily. 30 tablet 6  . dapagliflozin propanediol (FARXIGA) 10 MG TABS tablet Take 1 tablet (10 mg total) by mouth daily before breakfast. 30 tablet 6  . gabapentin  (NEURONTIN) 300 MG capsule Take 2 capsules (600 mg total) by mouth 3 (three) times daily. 180 capsule 6  . glipiZIDE (GLUCOTROL) 10 MG tablet Take 1 tablet (10 mg total) by mouth 2 (two) times daily before a meal. 60 tablet 6  . glucose blood test strip Use as instructed 100 each 12  . hydrochlorothiazide (HYDRODIURIL) 25 MG tablet Take 1 tablet (25 mg total) by mouth daily. 30 tablet 6  . ibuprofen (ADVIL) 600 MG tablet Take 1 tablet (600 mg total) by mouth 3 (three) times daily. With food X 7 days then prn pain 60 tablet 2  . Lancets (ACCU-CHEK SOFT TOUCH) lancets Use as instructed 100 each 12  . metFORMIN (GLUCOPHAGE) 1000 MG tablet Take 1 tablet (1,000 mg total) by mouth 2 (two) times daily with a meal. 60 tablet 6  . methocarbamol (ROBAXIN) 500 MG tablet Take 2 tablets (1,000 mg total) by mouth 3 (three) times daily. X 7 days then prn muscle spasm (Patient taking differently: Take 1,000 mg by mouth at bedtime. ) 90 tablet 0  . Multiple Vitamins-Minerals (MULTIVITAMIN WITH MINERALS) tablet Take 1 tablet by mouth daily. Chewables    . diclofenac sodium (VOLTAREN) 1 % GEL Apply 2 g topically 4 (four) times daily. (Patient not taking: Reported on 08/18/2019) 100 g 0  . HYDROcodone-acetaminophen (NORCO/VICODIN) 5-325  MG tablet Take 1 tablet by mouth every 6 (six) hours as needed for moderate pain. (Patient not taking: Reported on 08/18/2019) 15 tablet 0   No facility-administered medications prior to visit.     ROS Review of Systems  Constitutional: Negative for activity change, appetite change and fatigue.  HENT: Negative for congestion, sinus pressure and sore throat.   Eyes: Negative for visual disturbance.  Respiratory: Negative for cough, chest tightness, shortness of breath and wheezing.   Cardiovascular: Negative for chest pain and palpitations.  Gastrointestinal: Negative for abdominal distention, abdominal pain and constipation.  Endocrine: Negative for polydipsia.  Genitourinary:  Negative for dysuria and frequency.  Musculoskeletal: Negative for arthralgias and back pain.  Skin: Negative for rash.  Neurological: Negative for tremors, light-headedness and numbness.  Hematological: Does not bruise/bleed easily.  Psychiatric/Behavioral: Negative for agitation and behavioral problems.    Objective:  BP 107/66   Pulse 81   Ht 6' (1.829 m)   Wt 235 lb (106.6 kg)   SpO2 98%   BMI 31.87 kg/m   BP/Weight 11/18/2019 08/18/2019 7/61/9509  Systolic BP 326 712 458  Diastolic BP 66 74 69  Wt. (Lbs) 235 244 239.2  BMI 31.87 33.09 32.44      Physical Exam Constitutional:      Appearance: She is well-developed.  Neck:     Vascular: No JVD.  Cardiovascular:     Rate and Rhythm: Normal rate.     Heart sounds: Normal heart sounds. No murmur heard.   Pulmonary:     Effort: Pulmonary effort is normal.     Breath sounds: Normal breath sounds. No wheezing or rales.  Chest:     Chest wall: No tenderness.  Abdominal:     General: Bowel sounds are normal. There is no distension.     Palpations: Abdomen is soft. There is no mass.     Tenderness: There is no abdominal tenderness.  Musculoskeletal:        General: Tenderness (TTP mid lumbar spine) present. Normal range of motion.     Right lower leg: No edema.     Left lower leg: No edema.  Neurological:     Mental Status: She is alert and oriented to person, place, and time.  Psychiatric:        Mood and Affect: Mood normal.     CMP Latest Ref Rng & Units 08/18/2019 05/22/2019 09/03/2018  Glucose 65 - 99 mg/dL 228(H) 174(H) 156(H)  BUN 8 - 27 mg/dL 13 15 18   Creatinine 0.57 - 1.00 mg/dL 1.08(H) 1.03(H) 1.02(H)  Sodium 134 - 144 mmol/L 140 142 141  Potassium 3.5 - 5.2 mmol/L 3.9 3.6 3.9  Chloride 96 - 106 mmol/L 99 105 101  CO2 20 - 29 mmol/L 25 26 24   Calcium 8.7 - 10.3 mg/dL 9.3 9.1 9.6  Total Protein 6.5 - 8.1 g/dL - 6.8 7.3  Total Bilirubin 0.3 - 1.2 mg/dL - 0.5 <0.2  Alkaline Phos 38 - 126 U/L - 95 113    AST 15 - 41 U/L - 25 24  ALT 0 - 44 U/L - 29 28    Lipid Panel     Component Value Date/Time   CHOL 186 09/03/2018 1359   TRIG 219 (H) 09/03/2018 1359   HDL 48 09/03/2018 1359   CHOLHDL 3.9 09/03/2018 1359   CHOLHDL 4.3 10/16/2013 1019   VLDL 23 10/16/2013 1019   LDLCALC 94 09/03/2018 1359    CBC    Component Value Date/Time  WBC 5.7 05/22/2019 0951   RBC 4.40 05/22/2019 0951   HGB 12.2 05/22/2019 0951   HCT 38.2 05/22/2019 0951   PLT 298 05/22/2019 0951   MCV 86.8 05/22/2019 0951   MCH 27.7 05/22/2019 0951   MCHC 31.9 05/22/2019 0951   RDW 14.3 05/22/2019 0951   LYMPHSABS 2.7 05/22/2019 0951   MONOABS 0.3 05/22/2019 0951   EOSABS 0.2 05/22/2019 0951   BASOSABS 0.1 05/22/2019 0951    Lab Results  Component Value Date   HGBA1C 7.8 (A) 11/18/2019    Assessment & Plan:  1. Type 2 diabetes mellitus with diabetic neuropathy, without long-term current use of insulin (HCC) Improved with A1c of 7.8; goal is less than 7.0 Continue current regimen and commended on improvement She is motivated to work on lifestyle hence I will make no regimen changes today Counseled on Diabetic diet, my plate method, 295 minutes of moderate intensity exercise/week Blood sugar logs with fasting goals of 80-120 mg/dl, random of less than 180 and in the event of sugars less than 60 mg/dl or greater than 400 mg/dl encouraged to notify the clinic. Advised on the need for annual eye exams, annual foot exams, Pneumonia vaccine. - POCT glucose (manual entry) - POCT glycosylated hemoglobin (Hb A1C)  2. Hypertension associated with diabetes ([facility]) Controlled Continue current regimen Counseled on blood pressure goal of less than 130/80, low-sodium, DASH diet, medication compliance, 150 minutes of moderate intensity exercise per week. Discussed medication compliance, adverse effects.   3. Bilateral sciatica Controlled on current regimen  4. Hyperlipidemia associated with type 2 diabetes  mellitus (HCC) Controlled Continue statin  5. Need for immunization against influenza - Flu Vaccine QUAD 36+ mos IM   No orders of the defined types were placed in this encounter.   Follow-up: Return in about 3 months (around 02/18/2020) for Chronic disease management.       [name], MD, FAAFP. [facility], [place]   11/18/2019, 4:18 PM

## 2019-11-18 NOTE — Patient Instructions (Signed)
Diabetes Mellitus and Foot Care Foot care is an important part of your health, especially when you have diabetes. Diabetes may cause you to have problems because of poor blood flow (circulation) to your feet and legs, which can cause your skin to:  Become thinner and drier.  Break more easily.  Heal more slowly.  Peel and crack. You may also have nerve damage (neuropathy) in your legs and feet, causing decreased feeling in them. This means that you may not notice minor injuries to your feet that could lead to more serious problems. Noticing and addressing any potential problems early is the best way to prevent future foot problems. How to care for your feet Foot hygiene  Wash your feet daily with warm water and mild soap. Do not use hot water. Then, pat your feet and the areas between your toes until they are completely dry. Do not soak your feet as this can dry your skin.  Trim your toenails straight across. Do not dig under them or around the cuticle. File the edges of your nails with an emery board or nail file.  Apply a moisturizing lotion or petroleum jelly to the skin on your feet and to dry, brittle toenails. Use lotion that does not contain alcohol and is unscented. Do not apply lotion between your toes. Shoes and socks  Wear clean socks or stockings every day. Make sure they are not too tight. Do not wear knee-high stockings since they may decrease blood flow to your legs.  Wear shoes that fit properly and have enough cushioning. Always look in your shoes before you put them on to be sure there are no objects inside.  To break in new shoes, wear them for just a few hours a day. This prevents injuries on your feet. Wounds, scrapes, corns, and calluses  Check your feet daily for blisters, cuts, bruises, sores, and redness. If you cannot see the bottom of your feet, use a mirror or ask someone for help.  Do not cut corns or calluses or try to remove them with medicine.  If you  find a minor scrape, cut, or break in the skin on your feet, keep it and the skin around it clean and dry. You may clean these areas with mild soap and water. Do not clean the area with peroxide, alcohol, or iodine.  If you have a wound, scrape, corn, or callus on your foot, look at it several times a day to make sure it is healing and not infected. Check for: ? Redness, swelling, or pain. ? Fluid or blood. ? Warmth. ? Pus or a bad smell. General instructions  Do not cross your legs. This may decrease blood flow to your feet.  Do not use heating pads or hot water bottles on your feet. They may burn your skin. If you have lost feeling in your feet or legs, you may not know this is happening until it is too late.  Protect your feet from hot and cold by wearing shoes, such as at the beach or on hot pavement.  Schedule a complete foot exam at least once a year (annually) or more often if you have foot problems. If you have foot problems, report any cuts, sores, or bruises to your health care provider immediately. Contact a health care provider if:  You have a medical condition that increases your risk of infection and you have any cuts, sores, or bruises on your feet.  You have an injury that is not   healing.  You have redness on your legs or feet.  You feel burning or tingling in your legs or feet.  You have pain or cramps in your legs and feet.  Your legs or feet are numb.  Your feet always feel cold.  You have pain around a toenail. Get help right away if:  You have a wound, scrape, corn, or callus on your foot and: ? You have pain, swelling, or redness that gets worse. ? You have fluid or blood coming from the wound, scrape, corn, or callus. ? Your wound, scrape, corn, or callus feels warm to the touch. ? You have pus or a bad smell coming from the wound, scrape, corn, or callus. ? You have a fever. ? You have a red line going up your leg. Summary  Check your feet every day  for cuts, sores, red spots, swelling, and blisters.  Moisturize feet and legs daily.  Wear shoes that fit properly and have enough cushioning.  If you have foot problems, report any cuts, sores, or bruises to your health care provider immediately.  Schedule a complete foot exam at least once a year (annually) or more often if you have foot problems. This information is not intended to replace advice given to you by your health care provider. Make sure you discuss any questions you have with your health care provider. Document Revised: 10/16/2018 Document Reviewed: 02/25/2016 Elsevier Patient Education  2020 [employer]  

## 2019-11-19 MED ORDER — IBUPROFEN 600 MG PO TABS: 600.0000 mg | ORAL_TABLET | Freq: Three times a day (TID) | ORAL | 2 refills | Status: DC

## 2019-12-10 ENCOUNTER — Other Ambulatory Visit: Payer: Self-pay

## 2019-12-10 ENCOUNTER — Ambulatory Visit
Admission: RE | Admit: 2019-12-10 | Discharge: 2019-12-10 | Disposition: A | Discharge status: Home / Self Care | Payer: BC Managed Care – PPO | Source: Ambulatory Visit | Attending: Family Medicine | Admitting: Family Medicine

## 2019-12-10 DIAGNOSIS — Z1231 Encounter for screening mammogram for malignant neoplasm of breast: Secondary | ICD-10-CM | POA: Diagnosis not present

## 2019-12-10 IMAGING — MG DIGITAL SCREENING BILAT W/ TOMO W/ CAD
8 series · 8 of 24 positions shown · non-contrast
Comparison: Previous exam(s).

CLINICAL DATA: Screening.

EXAM:
DIGITAL SCREENING BILATERAL MAMMOGRAM WITH TOMO AND CAD

[R CC synth-2D]
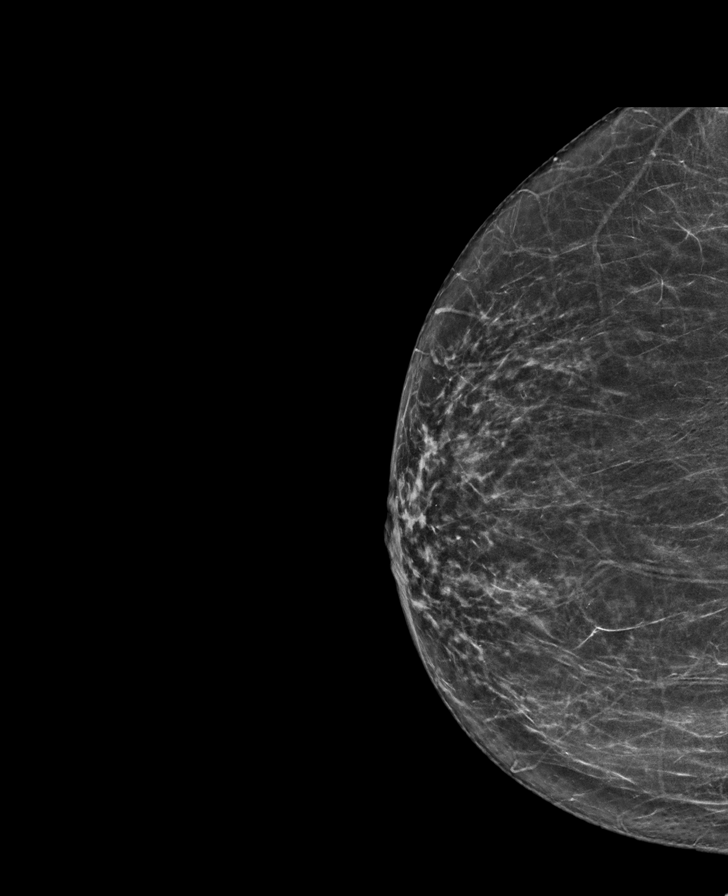

[R MLO synth-2D]
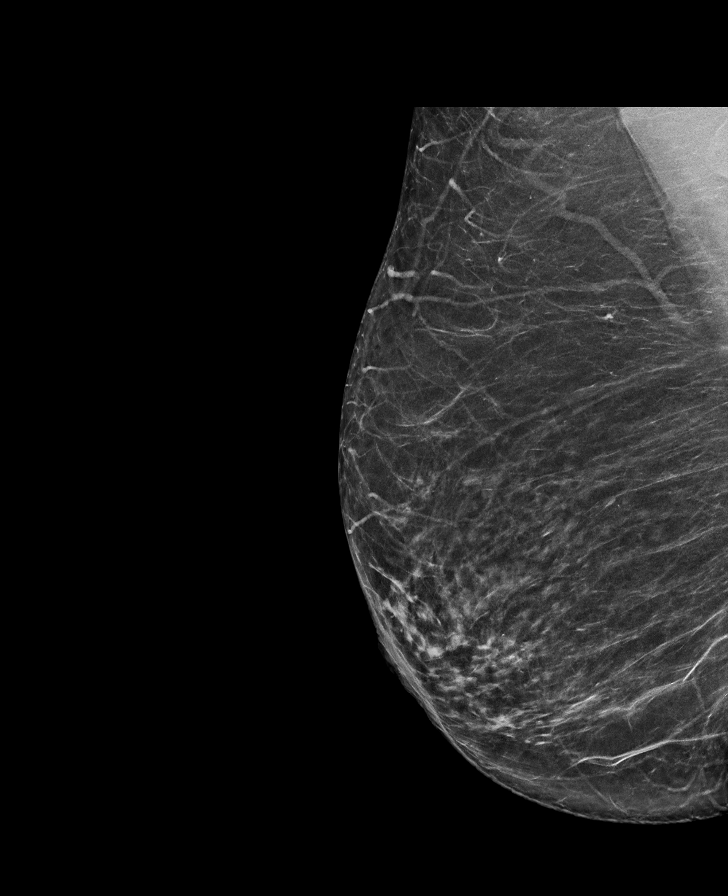

[L CC synth-2D]
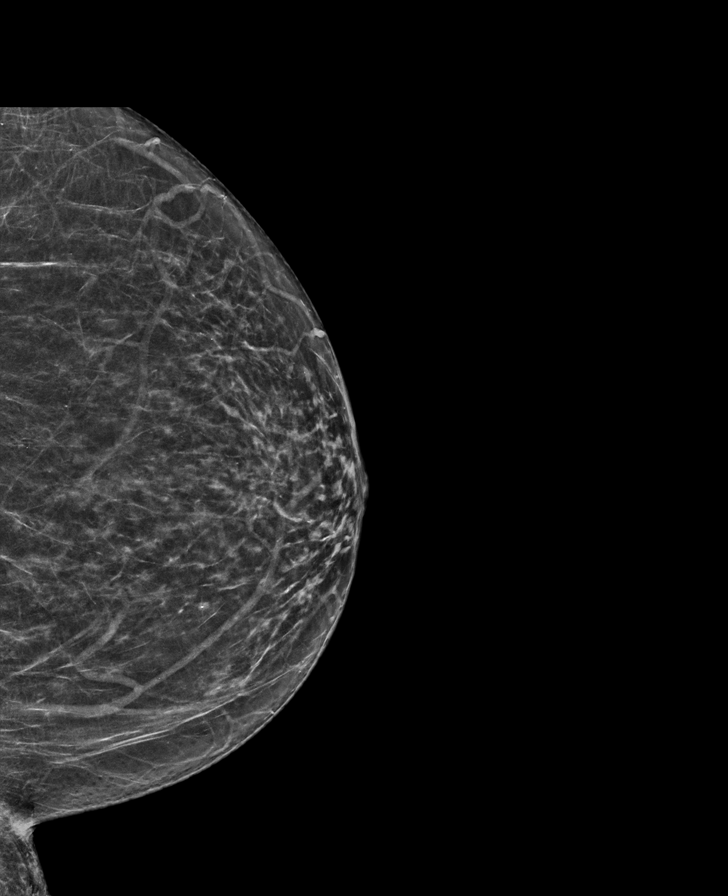

[L MLO synth-2D]
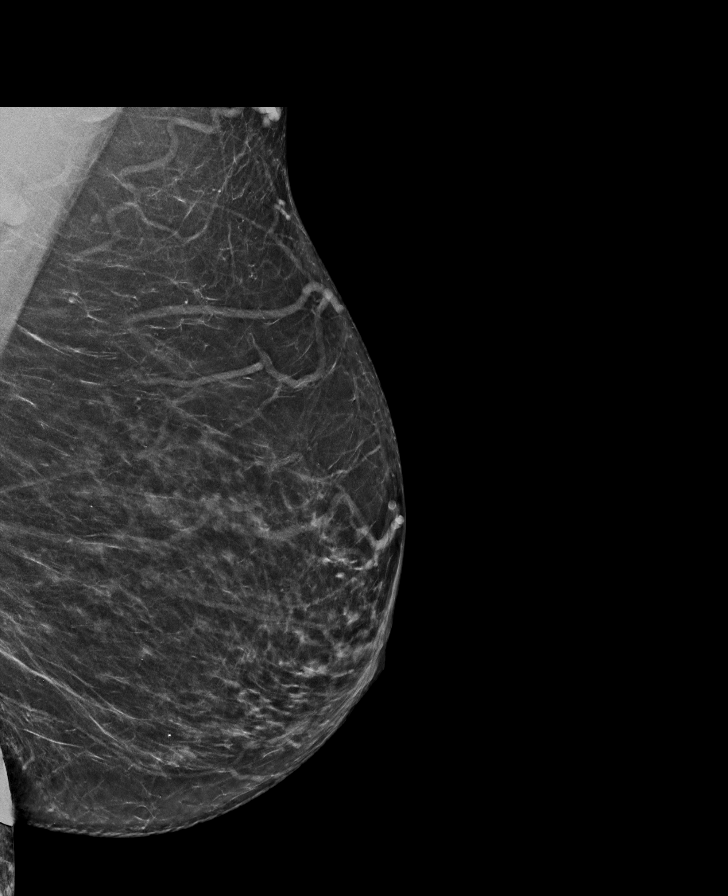

[L CC tomo · tomo slice 32/63.0]
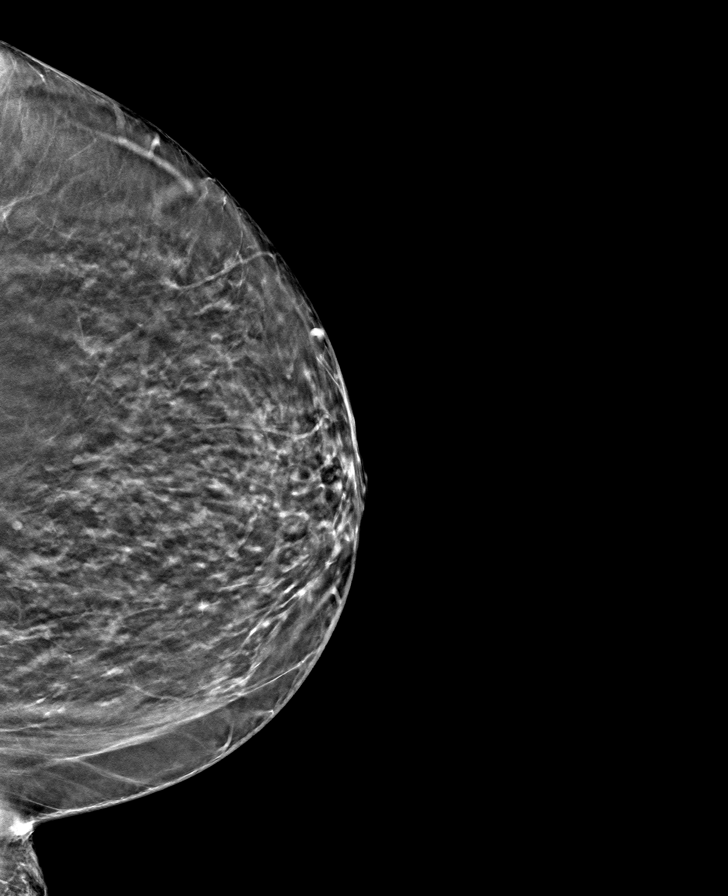

[R MLO tomo · tomo slice 40/79.0]
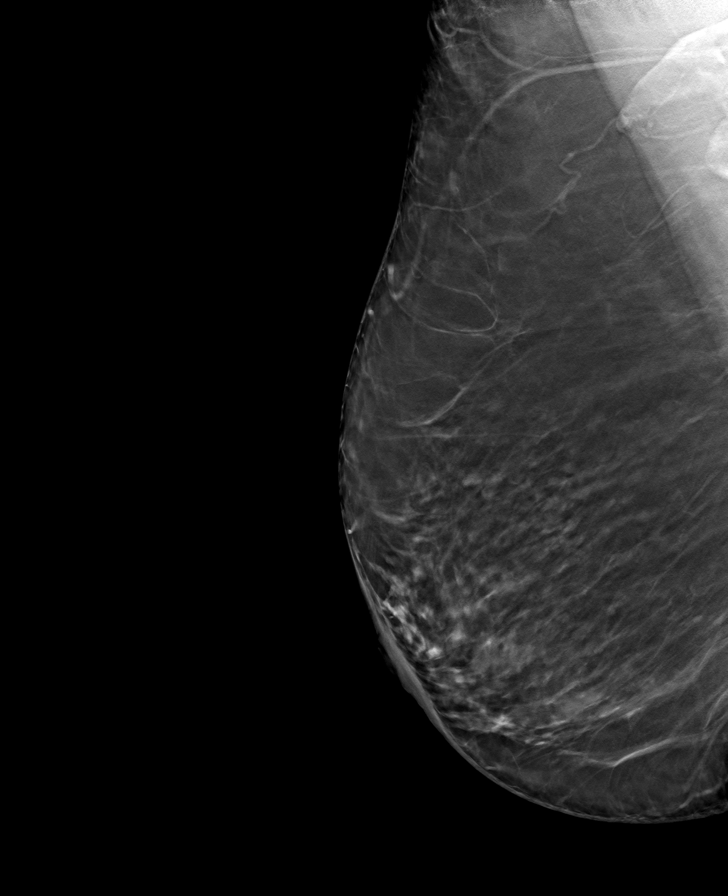

[R CC tomo · tomo slice 34/67.0]
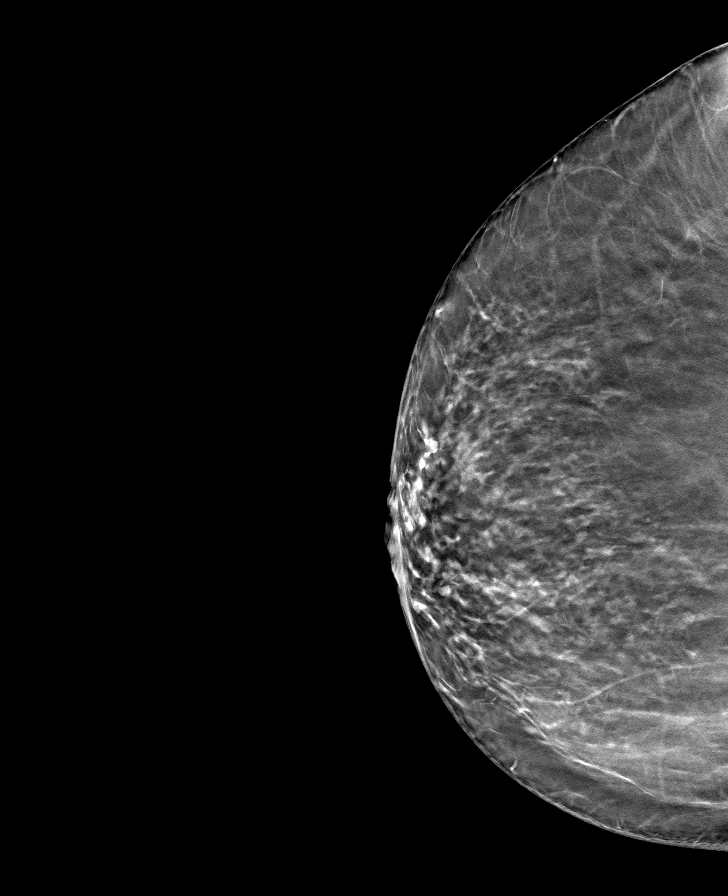

[L MLO tomo · tomo slice 37/72.0]
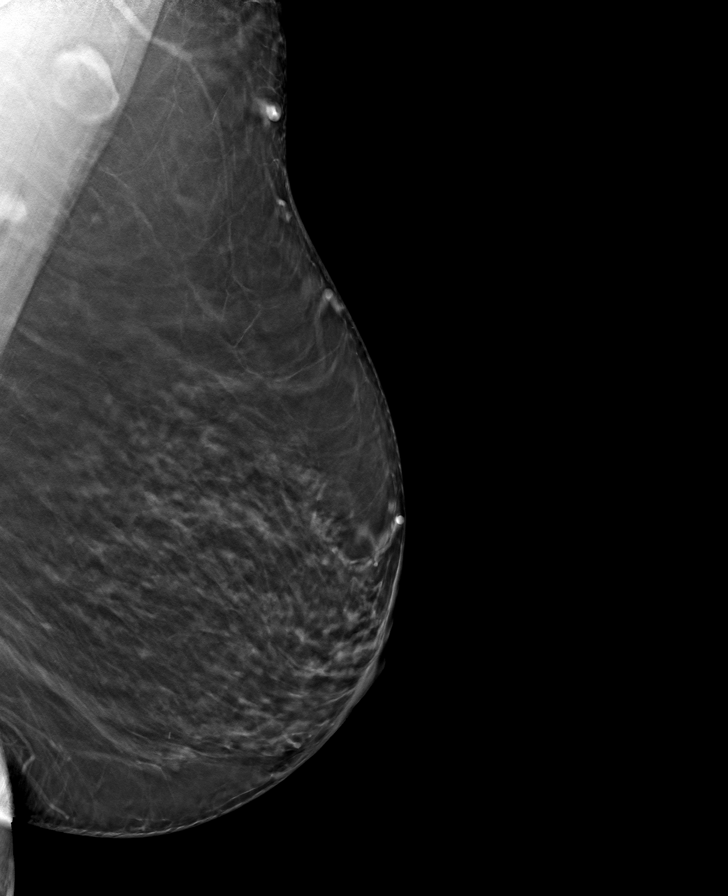

[8 of 24 positions shown; findings below may reference images not displayed]

ACR Breast Density Category b: There are scattered areas of
fibroglandular density.
FINDINGS: There are no findings suspicious for malignancy. Images were
processed with CAD.
IMPRESSION: No mammographic evidence of malignancy. A result letter of this
screening mammogram will be mailed directly to the patient.

RECOMMENDATION:
Screening mammogram in one year. (Code:[TQ])

BI-RADS CATEGORY  1: Negative.

## 2020-01-22 ENCOUNTER — Other Ambulatory Visit: Payer: Self-pay

## 2020-01-22 ENCOUNTER — Inpatient Hospital Stay (HOSPITAL_COMMUNITY): Payer: BC Managed Care – PPO

## 2020-01-22 ENCOUNTER — Encounter (HOSPITAL_COMMUNITY): Payer: Self-pay

## 2020-01-22 ENCOUNTER — Ambulatory Visit (HOSPITAL_COMMUNITY)
Admission: EM | Admit: 2020-01-22 | Discharge: 2020-01-22 | Disposition: A | Discharge status: Home / Self Care | Payer: BC Managed Care – PPO | Attending: Family Medicine | Admitting: Family Medicine

## 2020-01-22 ENCOUNTER — Emergency Department (HOSPITAL_COMMUNITY): Payer: BC Managed Care – PPO

## 2020-01-22 ENCOUNTER — Encounter (HOSPITAL_COMMUNITY): Payer: Self-pay | Admitting: Emergency Medicine

## 2020-01-22 ENCOUNTER — Inpatient Hospital Stay (HOSPITAL_COMMUNITY)
Admission: EM | Admit: 2020-01-22 | Discharge: 2020-01-23 | DRG: 062 | Disposition: A | Discharge status: Home / Self Care | Payer: BC Managed Care – PPO | Attending: Neurology | Admitting: Neurology

## 2020-01-22 DIAGNOSIS — R29701 NIHSS score 1: Secondary | ICD-10-CM | POA: Diagnosis not present

## 2020-01-22 DIAGNOSIS — Z20822 Contact with and (suspected) exposure to covid-19: Secondary | ICD-10-CM | POA: Diagnosis not present

## 2020-01-22 DIAGNOSIS — N179 Acute kidney failure, unspecified: Secondary | ICD-10-CM | POA: Diagnosis not present

## 2020-01-22 DIAGNOSIS — I6389 Other cerebral infarction: Secondary | ICD-10-CM

## 2020-01-22 DIAGNOSIS — R42 Dizziness and giddiness: Secondary | ICD-10-CM | POA: Diagnosis not present

## 2020-01-22 DIAGNOSIS — Z90710 Acquired absence of both cervix and uterus: Secondary | ICD-10-CM

## 2020-01-22 DIAGNOSIS — E7800 Pure hypercholesterolemia, unspecified: Secondary | ICD-10-CM | POA: Diagnosis not present

## 2020-01-22 DIAGNOSIS — E1165 Type 2 diabetes mellitus with hyperglycemia: Secondary | ICD-10-CM | POA: Diagnosis present

## 2020-01-22 DIAGNOSIS — H4911 Fourth [trochlear] nerve palsy, right eye: Secondary | ICD-10-CM | POA: Diagnosis not present

## 2020-01-22 DIAGNOSIS — Z87891 Personal history of nicotine dependence: Secondary | ICD-10-CM

## 2020-01-22 DIAGNOSIS — E669 Obesity, unspecified: Secondary | ICD-10-CM | POA: Diagnosis not present

## 2020-01-22 DIAGNOSIS — G459 Transient cerebral ischemic attack, unspecified: Principal | ICD-10-CM | POA: Diagnosis present

## 2020-01-22 DIAGNOSIS — R29818 Other symptoms and signs involving the nervous system: Secondary | ICD-10-CM | POA: Diagnosis not present

## 2020-01-22 DIAGNOSIS — E785 Hyperlipidemia, unspecified: Secondary | ICD-10-CM | POA: Diagnosis not present

## 2020-01-22 DIAGNOSIS — I10 Essential (primary) hypertension: Secondary | ICD-10-CM | POA: Diagnosis present

## 2020-01-22 DIAGNOSIS — I639 Cerebral infarction, unspecified: Secondary | ICD-10-CM

## 2020-01-22 DIAGNOSIS — Z79899 Other long term (current) drug therapy: Secondary | ICD-10-CM

## 2020-01-22 DIAGNOSIS — G8929 Other chronic pain: Secondary | ICD-10-CM | POA: Diagnosis present

## 2020-01-22 DIAGNOSIS — E1141 Type 2 diabetes mellitus with diabetic mononeuropathy: Secondary | ICD-10-CM | POA: Diagnosis present

## 2020-01-22 DIAGNOSIS — Z6833 Body mass index (BMI) 33.0-33.9, adult: Secondary | ICD-10-CM | POA: Diagnosis not present

## 2020-01-22 LAB — DIFFERENTIAL
Abs Immature Granulocytes: 0.01 10*3/uL (ref 0.00–0.07)
Basophils Absolute: 0.1 10*3/uL (ref 0.0–0.1)
Basophils Relative: 1 %
Eosinophils Absolute: 0.2 10*3/uL (ref 0.0–0.5)
Eosinophils Relative: 3 %
Immature Granulocytes: 0 %
Lymphocytes Relative: 41 %
Lymphs Abs: 3.2 10*3/uL (ref 0.7–4.0)
Monocytes Absolute: 0.7 10*3/uL (ref 0.1–1.0)
Monocytes Relative: 9 %
Neutro Abs: 3.7 10*3/uL (ref 1.7–7.7)
Neutrophils Relative %: 46 %

## 2020-01-22 LAB — APTT: aPTT: 24 seconds (ref 24–36)

## 2020-01-22 LAB — CBC
HCT: 39.2 % (ref 36.0–46.0)
Hemoglobin: 12.4 g/dL (ref 12.0–15.0)
MCH: 26.3 pg (ref 26.0–34.0)
MCHC: 31.6 g/dL (ref 30.0–36.0)
MCV: 83.2 fL (ref 80.0–100.0)
Platelets: 356 10*3/uL (ref 150–400)
RBC: 4.71 MIL/uL (ref 3.87–5.11)
RDW: 14.8 % (ref 11.5–15.5)
WBC: 7.9 10*3/uL (ref 4.0–10.5)
nRBC: 0 % (ref 0.0–0.2)

## 2020-01-22 LAB — I-STAT CHEM 8, ED
BUN: 27 mg/dL — ABNORMAL HIGH (ref 8–23)
Calcium, Ion: 1.15 mmol/L (ref 1.15–1.40)
Chloride: 100 mmol/L (ref 98–111)
Creatinine, Ser: 2 mg/dL — ABNORMAL HIGH (ref 0.44–1.00)
Glucose, Bld: 228 mg/dL — ABNORMAL HIGH (ref 70–99)
HCT: 41 % (ref 36.0–46.0)
Hemoglobin: 13.9 g/dL (ref 12.0–15.0)
Potassium: 3.5 mmol/L (ref 3.5–5.1)
Sodium: 139 mmol/L (ref 135–145)
TCO2: 24 mmol/L (ref 22–32)

## 2020-01-22 LAB — CBG MONITORING, ED
Glucose-Capillary: 233 mg/dL — ABNORMAL HIGH (ref 70–99)
Glucose-Capillary: 227 mg/dL — ABNORMAL HIGH (ref 70–99)
Glucose-Capillary: 156 mg/dL — ABNORMAL HIGH (ref 70–99)

## 2020-01-22 LAB — COMPREHENSIVE METABOLIC PANEL
ALT: 32 U/L (ref 0–44)
AST: 29 U/L (ref 15–41)
Albumin: 3.7 g/dL (ref 3.5–5.0)
Alkaline Phosphatase: 98 U/L (ref 38–126)
Anion gap: 18 — ABNORMAL HIGH (ref 5–15)
BUN: 25 mg/dL — ABNORMAL HIGH (ref 8–23)
CO2: 23 mmol/L (ref 22–32)
Calcium: 9.4 mg/dL (ref 8.9–10.3)
Chloride: 97 mmol/L — ABNORMAL LOW (ref 98–111)
Creatinine, Ser: 2.12 mg/dL — ABNORMAL HIGH (ref 0.44–1.00)
GFR, Estimated: 26 mL/min — ABNORMAL LOW (ref >60)
Glucose, Bld: 233 mg/dL — ABNORMAL HIGH (ref 70–99)
Potassium: 3.5 mmol/L (ref 3.5–5.1)
Sodium: 138 mmol/L (ref 135–145)
Total Bilirubin: 0.6 mg/dL (ref 0.3–1.2)
Total Protein: 7.5 g/dL (ref 6.5–8.1)

## 2020-01-22 LAB — RESP PANEL BY RT-PCR (FLU A&B, COVID) ARPGX2
Influenza A by PCR: NEGATIVE
Influenza B by PCR: NEGATIVE
SARS Coronavirus 2 by RT PCR: NEGATIVE

## 2020-01-22 LAB — ECHOCARDIOGRAM COMPLETE
Area-P 1/2: 2.91 cm2
Height: 73 in
S' Lateral: 2.8 cm
Weight: 4116.43 oz

## 2020-01-22 LAB — PROTIME-INR
INR: 1 (ref 0.8–1.2)
Prothrombin Time: 12.6 seconds (ref 11.4–15.2)

## 2020-01-22 LAB — TROPONIN I (HIGH SENSITIVITY)
Troponin I (High Sensitivity): 11 ng/L (ref <18)
Troponin I (High Sensitivity): 9 ng/L (ref <18)

## 2020-01-22 LAB — ETHANOL: Alcohol, Ethyl (B): <10 mg/dL (ref <10)

## 2020-01-22 IMAGING — CT CT HEAD CODE STROKE
3 series · 15 of 47 positions shown, 18 images · non-contrast
Comparison: CT head [DATE]

CLINICAL DATA: Code stroke. Acute neuro deficit with blurred vision
and dizziness

EXAM:
CT HEAD WITHOUT CONTRAST
TECHNIQUE: Contiguous axial images were obtained from the base of the skull
through the vertex without intravenous contrast.

[Series 3: head 5.0 st · axial · 0.43mm/px · z∈[-218,-88]mm · 9 of 32 slices shown, 12 images]
[im 3/32  brain]
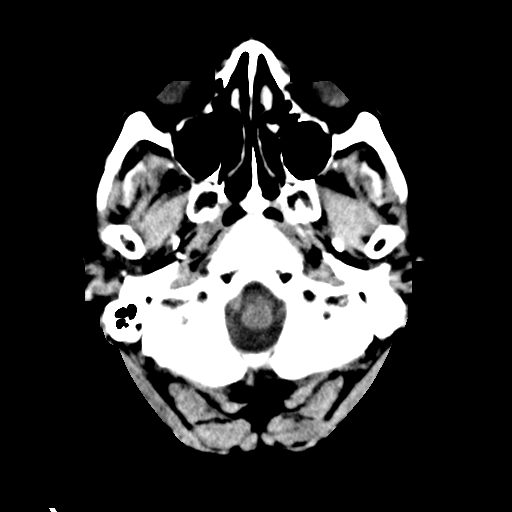
[im 3/32  bone]
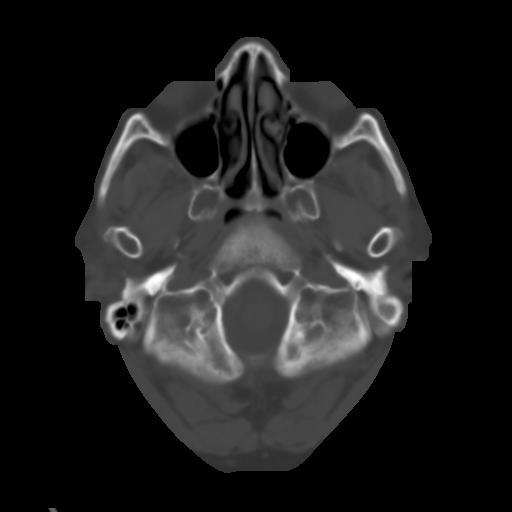
[im 6/32  brain]
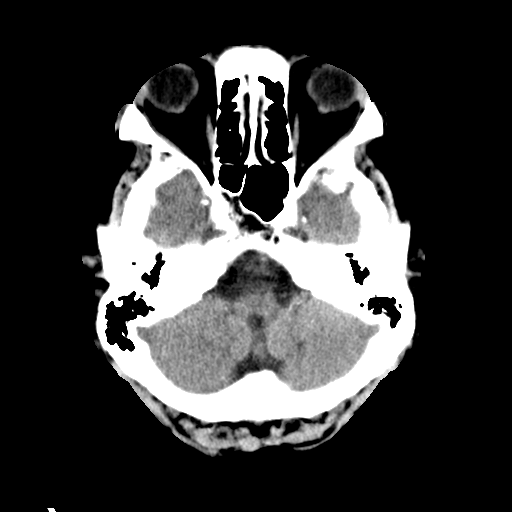
[im 9/32  brain]
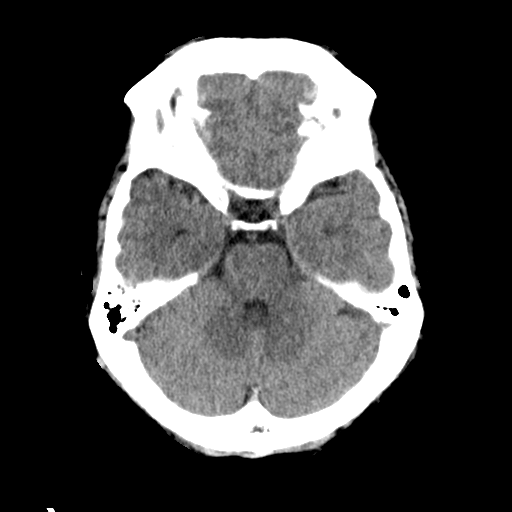
[im 12/32  brain]
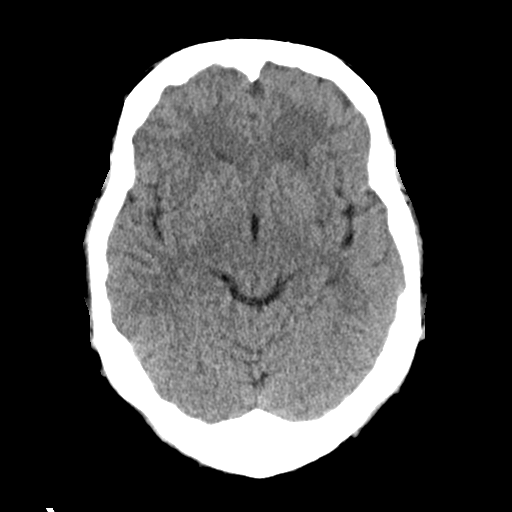
[im 17/32  brain]
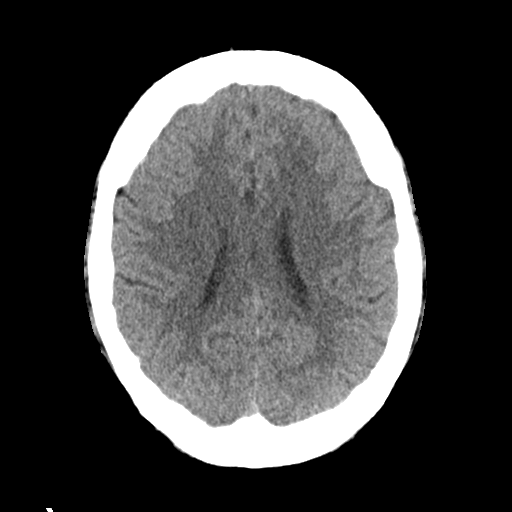
[im 17/32  bone]
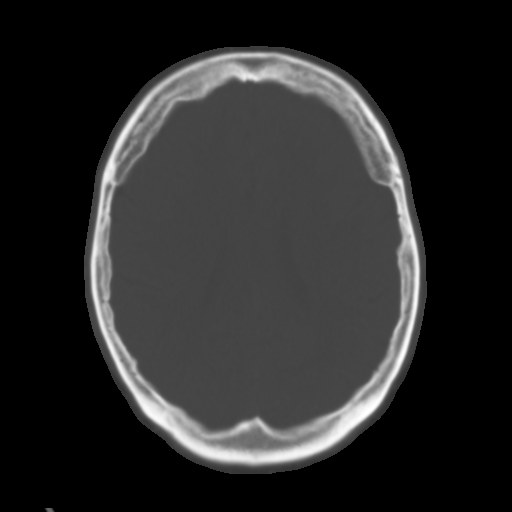
[im 20/32  brain]
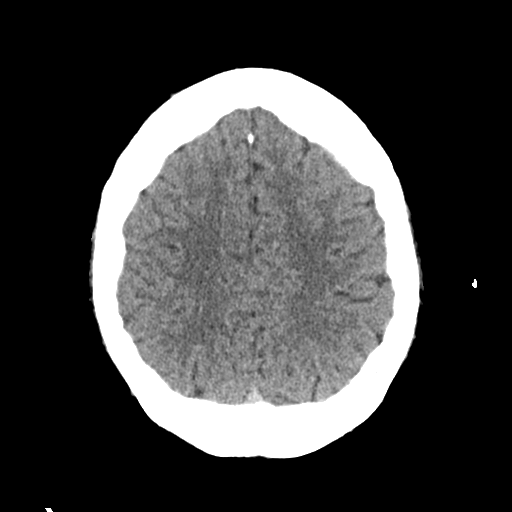
[im 23/32  brain]
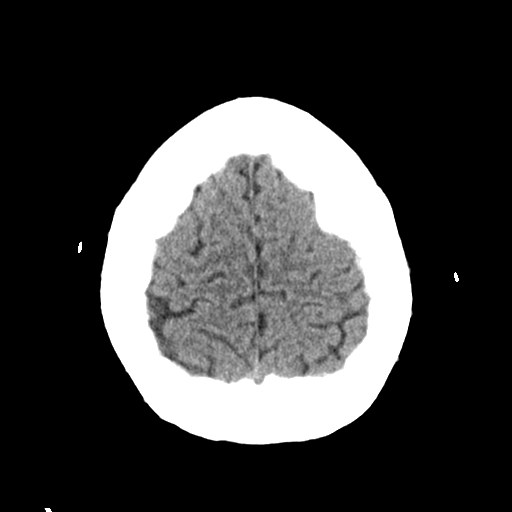
[im 26/32  brain]
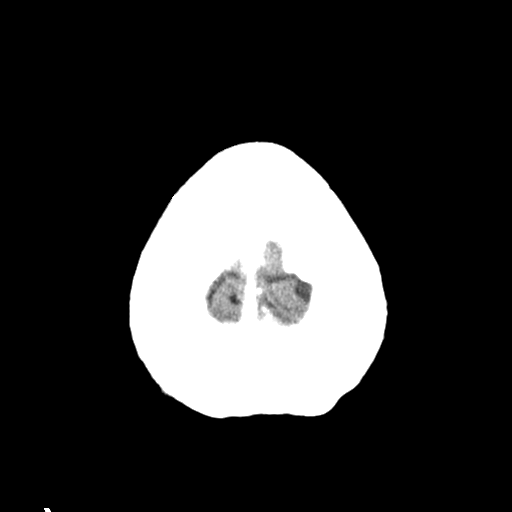
[im 29/32  brain]
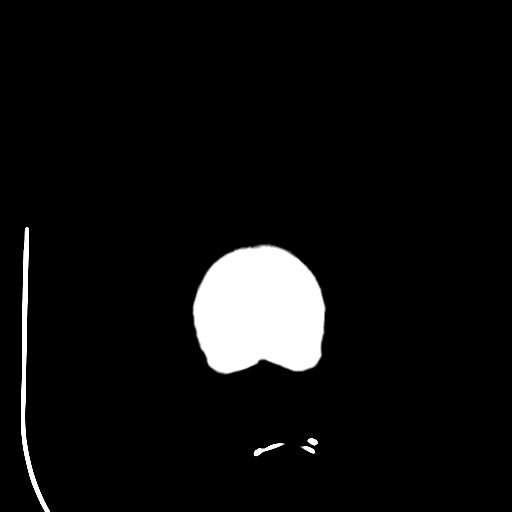
[im 29/32  bone]
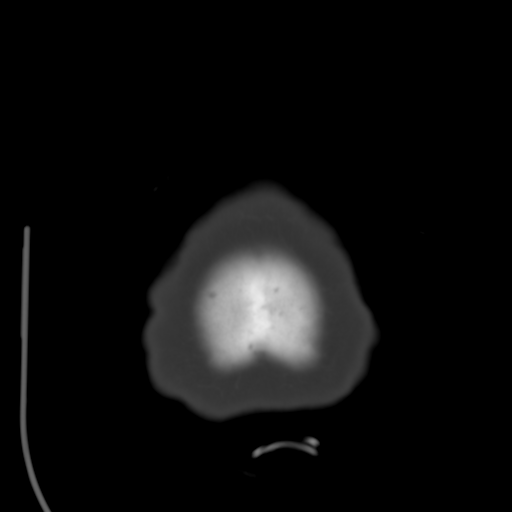

[Series 5: head 3.0 cor st · coronal · 0.30mm/px · 3 of 67 slices shown]
[im 23/67  brain]
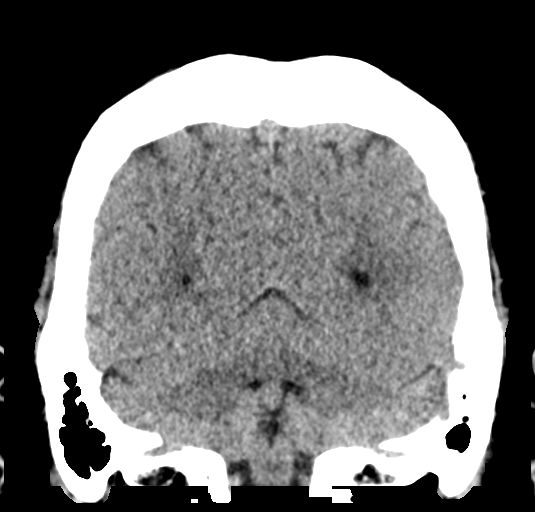
[im 30/67  brain]
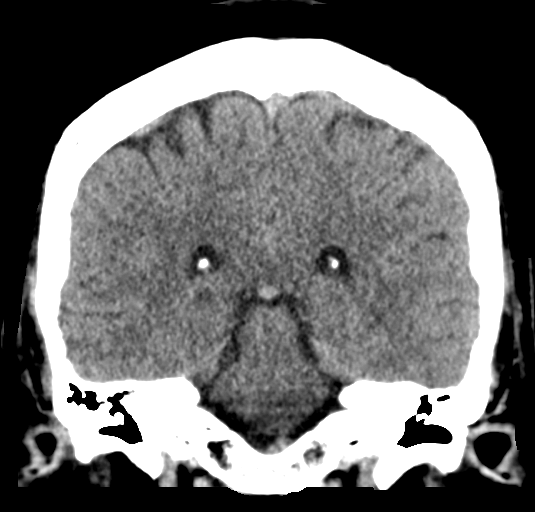
[im 37/67  brain]
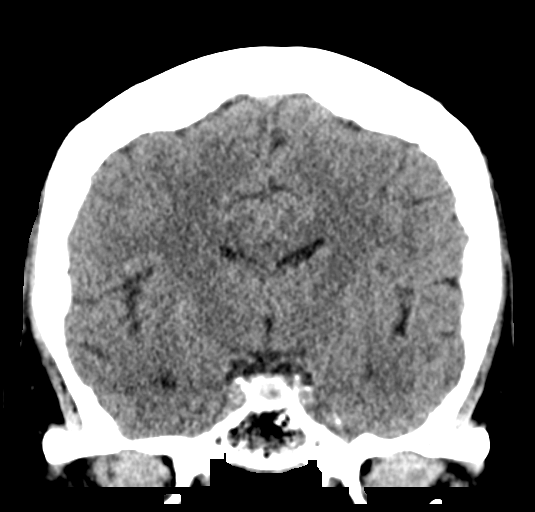

[Series 6: head 3.0 sag st · sagittal · 0.30mm/px · 3 of 55 slices shown]
[im 19/55  brain]
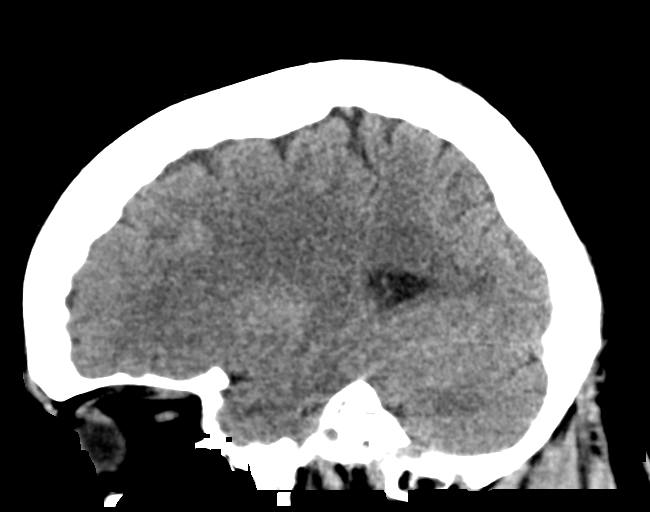
[im 28/55  brain]
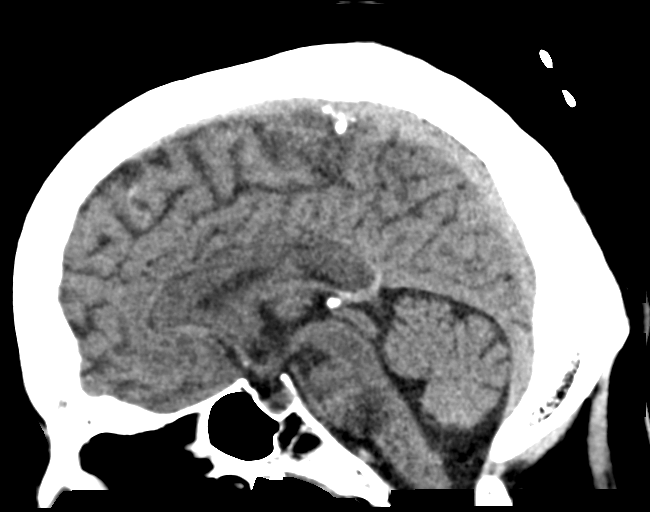
[im 37/55  brain]
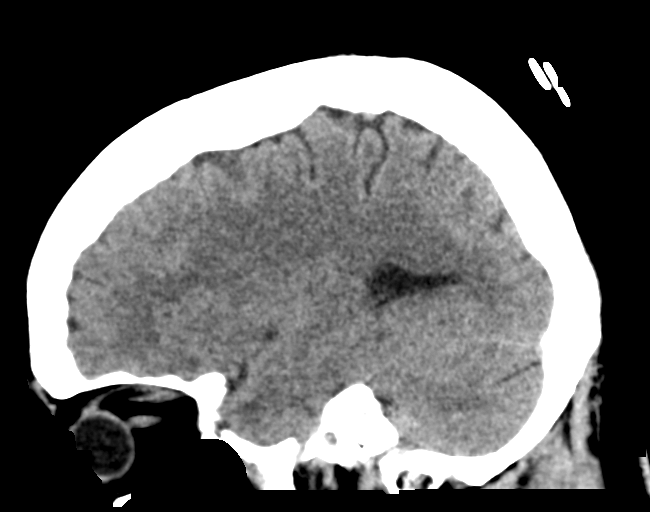

[15 of 47 positions shown; findings below may reference images not displayed]

FINDINGS: Brain: No evidence of acute infarction, hemorrhage, hydrocephalus,
extra-axial collection or mass lesion/mass effect.

Vascular: Negative for hyperdense vessel

Skull: Negative

Sinuses/Orbits: Paranasal sinuses clear.  Negative orbit

Other: None

ASPECTS ([place] Stroke Program Early CT Score)

- Ganglionic level infarction (caudate, lentiform nuclei, internal
capsule, insula, M1-M3 cortex): 7

- Supraganglionic infarction (M4-M6 cortex): 3

Total score (0-10 with 10 being normal): 10
IMPRESSION: 1. Negative CT head
2. ASPECTS is 10
3. Code stroke imaging results were communicated on [DATE] at
[DATE] to provider MAGDIEL via text page

## 2020-01-22 MED ORDER — METHOCARBAMOL 500 MG PO TABS
500.0000 mg | ORAL_TABLET | Freq: Every day | ORAL | Status: DC
  Administered 2020-01-22: 500 mg via ORAL
  Filled 2020-01-22: qty 1

## 2020-01-22 MED ORDER — ATORVASTATIN CALCIUM 10 MG PO TABS: 20.0000 mg | ORAL_TABLET | Freq: Every day | ORAL | Status: DC

## 2020-01-22 MED ORDER — STROKE: EARLY STAGES OF RECOVERY BOOK: Freq: Once | Status: DC

## 2020-01-22 MED ORDER — ALTEPLASE (STROKE) FULL DOSE INFUSION
90.0000 mg | Freq: Once | INTRAVENOUS | Status: AC
  Administered 2020-01-22: 90 mg via INTRAVENOUS
  Filled 2020-01-22: qty 100

## 2020-01-22 MED ORDER — SODIUM CHLORIDE 0.9 % IV SOLN
50.0000 mL | Freq: Once | INTRAVENOUS | Status: AC
  Administered 2020-01-22: 50 mL via INTRAVENOUS

## 2020-01-22 MED ORDER — GABAPENTIN 300 MG PO CAPS
300.0000 mg | ORAL_CAPSULE | Freq: Three times a day (TID) | ORAL | Status: DC
  Administered 2020-01-22: 300 mg via ORAL
  Filled 2020-01-22 (×2): qty 1

## 2020-01-22 MED ORDER — INSULIN ASPART 100 UNIT/ML ~~LOC~~ SOLN
0.0000 [IU] | Freq: Three times a day (TID) | SUBCUTANEOUS | Status: DC
  Administered 2020-01-23: 3 [IU] via SUBCUTANEOUS

## 2020-01-22 MED ORDER — SENNOSIDES-DOCUSATE SODIUM 8.6-50 MG PO TABS: 1.0000 | ORAL_TABLET | Freq: Every evening | ORAL | Status: DC | PRN

## 2020-01-22 MED ORDER — LABETALOL HCL 5 MG/ML IV SOLN: 5.0000 mg | INTRAVENOUS | Status: DC | PRN

## 2020-01-22 MED ORDER — INSULIN ASPART 100 UNIT/ML ~~LOC~~ SOLN: 0.0000 [IU] | Freq: Every day | SUBCUTANEOUS | Status: DC

## 2020-01-22 MED ORDER — ACETAMINOPHEN 325 MG PO TABS
650.0000 mg | ORAL_TABLET | ORAL | Status: DC | PRN
  Filled 2020-01-22: qty 2

## 2020-01-22 MED ORDER — LACTATED RINGERS IV BOLUS
1000.0000 mL | Freq: Once | INTRAVENOUS | Status: AC
  Administered 2020-01-22: 1000 mL via INTRAVENOUS

## 2020-01-22 MED ORDER — ACETAMINOPHEN 650 MG RE SUPP: 650.0000 mg | RECTAL | Status: DC | PRN

## 2020-01-22 MED ORDER — SODIUM CHLORIDE 0.9 % IV BOLUS
1000.0000 mL | Freq: Once | INTRAVENOUS | Status: AC
  Administered 2020-01-22: 1000 mL via INTRAVENOUS

## 2020-01-22 MED ORDER — ACETAMINOPHEN 160 MG/5ML PO SOLN: 650.0000 mg | ORAL | Status: DC | PRN

## 2020-01-22 MED ORDER — PANTOPRAZOLE SODIUM 40 MG IV SOLR
40.0000 mg | Freq: Every day | INTRAVENOUS | Status: DC
  Administered 2020-01-22: 40 mg via INTRAVENOUS
  Filled 2020-01-22: qty 40

## 2020-01-22 NOTE — ED Provider Notes (Signed)
[facility] EMERGENCY DEPARTMENT Provider Note   CSN: [id number] Arrival date & time: 01/22/20  1032  An emergency department physician performed an initial assessment on this suspected stroke patient at 1100.  History Chief Complaint  Patient presents with  . Code Stroke    Megan Lane is a 64 y.o. female.  HPI 64 year old female presents with acute dizziness.  Was fine when she woke up and while at work at around 9:15 AM she developed acute dizziness.  Feels like things are moving but also feels like she is going to pass out.  States this started while she was trying to put ice in the trash can because of a broken ice machine at work.  She was not specifically bending over per se.  She has not had any vomiting but her vision is a little blurry.  No headache.  She does feel little short of breath which worsened when she was walking to her car.  Symptoms are worse with standing and walking but present while sitting. She is a diabetic, and her glucose was ~200 this morning.  Past Medical History:  Diagnosis Date  . Asthma    as a child  . Diabetes mellitus   . Hypercholesterolemia   . Hypertension   . Ventral hernia   . Wears glasses     Patient Active Problem List   Diagnosis Date Noted  . 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 Acute bronchitis 07/09/2016  . CAP (community acquired pneumonia) 07/08/2016  . Chest pain 07/08/2016  . Headache 07/08/2016  . Weakness 07/08/2016  . HLD (hyperlipidemia) 07/08/2016  . Type 2 diabetes mellitus ([place]) 10/16/2013  . Back muscle spasm 04/30/2013  . Nasal congestion 04/30/2013  . Essential hypertension, benign 04/30/2013  . Pap smear for cervical cancer screening 03/20/2013    Past Surgical History:  Procedure Laterality Date  . ABDOMINAL HYSTERECTOMY    . KNEE SURGERY    . LEG SURGERY     left lower extremity  . TUBAL LIGATION    . VENTRAL HERNIA  REPAIR N/A 05/29/2019   Procedure: REPAIR OF VENTRAL HERNIA WITH MESH;  Surgeon: Erroll Luna, MD;  Location: [facility];  Service: General;  Laterality: N/A;     OB History   No obstetric history on file.     Family History  Problem Relation Age of Onset  . Diabetes Mother   . Hypertension Mother   . Stroke Mother   . Heart disease Brother   . Breast cancer Sister 64    Social History   Tobacco Use  . Smoking status: Former Smoker    Quit date: 08/11/2005    Years since quitting: 14.4  . Smokeless tobacco: Never Used  Vaping Use  . Vaping Use: Never used  Substance Use Topics  . Alcohol use: No  . Drug use: No    Home Medications Prior to Admission medications   Medication Sig Start Date End Date Taking? Authorizing Provider  acetaminophen (TYLENOL) 500 MG tablet Take 2 tablets (1,000 mg total) by mouth every 8 (eight) hours as needed for moderate pain. 02/12/17   Alfonse Spruce, FNP  atorvastatin (LIPITOR) 20 MG tablet Take 1 tablet (20 mg total) by mouth daily. 08/18/19   [name], MD  dapagliflozin propanediol (FARXIGA) 10 MG TABS tablet Take 1 tablet (10 mg total) by mouth daily before breakfast. 08/18/19   [name], MD  diclofenac sodium (VOLTAREN) 1 %  GEL Apply 2 g topically 4 (four) times daily. Patient not taking: No sig reported 11/18/18   Ladell Pier, MD  gabapentin (NEURONTIN) 300 MG capsule Take 2 capsules (600 mg total) by mouth 3 (three) times daily. 08/18/19   [name], MD  glipiZIDE (GLUCOTROL) 10 MG tablet Take 1 tablet (10 mg total) by mouth 2 (two) times daily before a meal. 08/18/19   [name], MD  glucose blood test strip Use as instructed 02/12/17   Alfonse Spruce, FNP  hydrochlorothiazide (HYDRODIURIL) 25 MG tablet Take 1 tablet (25 mg total) by mouth daily. 08/18/19   [name], MD  HYDROcodone-acetaminophen (NORCO/VICODIN) 5-325 MG tablet Take 1 tablet by mouth every 6 (six) hours as needed for moderate  pain. Patient not taking: No sig reported 05/29/19   Erroll Luna, MD  ibuprofen (ADVIL) 600 MG tablet Take 1 tablet (600 mg total) by mouth 3 (three) times daily. With food X 7 days then prn pain 11/19/19   [name], MD  Lancets (ACCU-CHEK SOFT TOUCH) lancets Use as instructed 02/12/17   Alfonse Spruce, FNP  metFORMIN (GLUCOPHAGE) 1000 MG tablet Take 1 tablet (1,000 mg total) by mouth 2 (two) times daily with a meal. 08/18/19   [name], MD  methocarbamol (ROBAXIN) 500 MG tablet Take 2 tablets (1,000 mg total) by mouth 3 (three) times daily. X 7 days then prn muscle spasm Patient taking differently: Take 1,000 mg by mouth at bedtime. 03/20/19   Argentina Donovan, PA-C  Multiple Vitamins-Minerals (MULTIVITAMIN WITH MINERALS) tablet Take 1 tablet by mouth daily. Chewables    [provider]    Allergies    Penicillins  Review of Systems   Review of Systems  HENT: Negative for ear pain.   Eyes: Positive for visual disturbance.  Respiratory: Positive for shortness of breath.   Cardiovascular: Negative for chest pain.  Gastrointestinal: Negative for vomiting.  Musculoskeletal: Positive for gait problem.  Neurological: Positive for dizziness and light-headedness. Negative for headaches.  All other systems reviewed and are negative.   Physical Exam Updated Vital Signs BP 128/68   Pulse 71   Temp 98.9 F (37.2 C)   Resp 15   Ht 6\' 1"  (1.854 m)   Wt 116.7 kg   SpO2 96%   BMI 33.94 kg/m   Physical Exam Vitals and nursing note reviewed.  Constitutional:      Appearance: She is well-developed and well-nourished.  HENT:     Head: Normocephalic and atraumatic.     Right Ear: External ear normal.     Left Ear: External ear normal.     Nose: Nose normal.  Eyes:     General:        Right eye: No discharge.        Left eye: No discharge.     Extraocular Movements: Extraocular movements intact.     Right eye: No nystagmus.     Left eye: No nystagmus.      Pupils: Pupils are equal, round, and reactive to light.  Cardiovascular:     Rate and Rhythm: Normal rate and regular rhythm.     Heart sounds: Normal heart sounds.  Pulmonary:     Effort: Pulmonary effort is normal.     Breath sounds: Normal breath sounds.  Abdominal:     Palpations: Abdomen is soft.     Tenderness: There is no abdominal tenderness.  Musculoskeletal:     Cervical back: Normal range of motion. No rigidity.  Skin:    General: Skin is warm and dry.  Neurological:     Mental Status: She is alert.     Comments: CN 3-12 grossly intact. 5/5 strength in all 4 extremities. Grossly normal sensation. Normal finger to nose. However, patient is quite off-balance while walking and has to be supported after only a few steps  Psychiatric:        Mood and Affect: Mood is not anxious.     ED Results / Procedures / Treatments   Labs (all labs ordered are listed, but only abnormal results are displayed) Labs Reviewed  COMPREHENSIVE METABOLIC PANEL - Abnormal; Notable for the following components:      Result Value   Chloride 97 (*)    Glucose, Bld 233 (*)    BUN 25 (*)    Creatinine, Ser 2.12 (*)    GFR, Estimated 26 (*)    Anion gap 18 (*)    All other components within normal limits  I-STAT CHEM 8, ED - Abnormal; Notable for the following components:   BUN 27 (*)    Creatinine, Ser 2.00 (*)    Glucose, Bld 228 (*)    All other components within normal limits  CBG MONITORING, ED - Abnormal; Notable for the following components:   Glucose-Capillary 227 (*)    All other components within normal limits  RESP PANEL BY RT-PCR (FLU A&B, COVID) ARPGX2  ETHANOL  PROTIME-INR  APTT  CBC  DIFFERENTIAL  RAPID URINE DRUG SCREEN, HOSP PERFORMED  URINALYSIS, ROUTINE W REFLEX MICROSCOPIC  TROPONIN I (HIGH SENSITIVITY)  TROPONIN I (HIGH SENSITIVITY)    EKG EKG Interpretation  Date/Time:  Thursday January 22 2020 10:31:30 EST Ventricular Rate:  97 PR Interval:  140 QRS  Duration: 92 QT Interval:  354 QTC Calculation: 449 R Axis:   -11 Text Interpretation: Normal sinus rhythm rate is a little faster, otherwise is similar to Apr 2021 Confirmed by Sherwood Gambler [phone]) on 01/22/2020 12:04:19 PM   Radiology CT HEAD CODE STROKE WO CONTRAST  Result Date: 01/22/2020 CLINICAL DATA:  Code stroke. Acute neuro deficit with blurred vision and dizziness EXAM: CT HEAD WITHOUT CONTRAST TECHNIQUE: Contiguous axial images were obtained from the base of the skull through the vertex without intravenous contrast. COMPARISON:  CT head 07/08/2016 FINDINGS: Brain: No evidence of acute infarction, hemorrhage, hydrocephalus, extra-axial collection or mass lesion/mass effect. Vascular: Negative for hyperdense vessel Skull: Negative Sinuses/Orbits: Paranasal sinuses clear.  Negative orbit Other: None ASPECTS ([place] Stroke Program Early CT Score) - Ganglionic level infarction (caudate, lentiform nuclei, internal capsule, insula, M1-M3 cortex): 7 - Supraganglionic infarction (M4-M6 cortex): 3 Total score (0-10 with 10 being normal): 10 IMPRESSION: 1. Negative CT head 2. ASPECTS is 10 3. Code stroke imaging results were communicated on 01/22/2020 at 11:16 am to provider Bhagat via text page Electronically Signed   By: Franchot Gallo M.D.   On: 01/22/2020 11:17     Procedures .Critical Care Performed by: Sherwood Gambler, MD Authorized by: Sherwood Gambler, MD   Critical care provider statement:    Critical care time (minutes):  40   Critical care was time spent personally by me on the following activities:  Discussions with consultants, evaluation of patient's response to treatment, examination of patient, ordering and performing treatments and interventions, ordering and review of laboratory studies, ordering and review of radiographic studies, pulse oximetry, re-evaluation of patient's condition, obtaining history from patient or surrogate and review of old charts   (including  critical care time)  Medications Ordered in ED Medications   stroke: mapping our early stages of recovery book (has no administration in time range)  acetaminophen (TYLENOL) tablet 650 mg (has no administration in time range)    Or  acetaminophen (TYLENOL) 160 MG/5ML solution 650 mg (has no administration in time range)    Or  acetaminophen (TYLENOL) suppository 650 mg (has no administration in time range)  senna-docusate (Senokot-S) tablet 1 tablet (has no administration in time range)  pantoprazole (PROTONIX) injection 40 mg (has no administration in time range)  labetalol (NORMODYNE) injection 5 mg (has no administration in time range)  sodium chloride 0.9 % bolus 1,000 mL (1,000 mLs Intravenous New Bag/Given 01/22/20 1143)  lactated ringers bolus 1,000 mL (1,000 mLs Intravenous New Bag/Given 01/22/20 1206)  alteplase (ACTIVASE) 1 mg/mL infusion 90 mg (0 mg Intravenous Stopped 01/22/20 1234)    Followed by  0.9 %  sodium chloride infusion (0 mLs Intravenous Stopped 01/22/20 1340)    ED Course  I have reviewed the triage vital signs and the nursing notes.  Pertinent labs & imaging results that were available during my care of the patient were reviewed by me and considered in my medical decision making (see chart for details).  Clinical Course as of 01/22/20 1353  Thu Jan 22, 2020  1054 Given patient's inability to walk without assistance and seeming ataxic, will call with acute code stroke. [SG]    Clinical Course User Index [SG] Sherwood Gambler, MD   MDM Rules/Calculators/A&P                          CT head has been personally reviewed.  Labs show acute kidney injury compared to a couple months ago.  Otherwise they are unremarkable.  However given her symptoms, inability to walk well, and neuro findings on hints exam by a neuro, they will administer TPA for acute Posterior circulation stroke.  Admit to neurology. Final Clinical Impression(s) / ED Diagnoses Final diagnoses:   Acute ischemic stroke ([place])  Acute kidney injury [facility])    Rx / DC Orders ED Discharge Orders    None       Sherwood Gambler, MD 01/22/20 1359

## 2020-01-22 NOTE — Progress Notes (Signed)
PHARMACIST CODE STROKE RESPONSE  Notified to mix tPA at 1135 by Dr. Curly Shores Delivered tPA to RN at 1137  tPA dose = 9 mg bolus over 1 minute followed by 81mg  for a total dose of 90mg  over 1 hour  Issues/delays encountered (if applicable): n/a  Megan Lane 01/22/20 12:10 PM

## 2020-01-22 NOTE — Code Documentation (Signed)
Stroke Response Nurse Documentation Code Documentation  Megan Lane is a 64 y.o. female arriving to [facility] ED via wheelchair from Urgent Care on 12/16 with past medical hx of Diabetes, asthma, hypercholesterolemia, HTN. Code stroke was activated by ED. Patient from Urgent Care after she arrived from work. 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 SHe said she suddenly felt dizziness and trouble walking. SHe was able to make it to the car and drive herself to Urgent Care. Urgent Care evaluated her and brought her to the emergency department.   Upon arrival to the ED, she was still having some dizziness and reported some trouble walking. NIHSS completed 0. MD Regenia Skeeter evaluated and activated a Code Stroke. Stroke team arrived to CT on patient arrival. Labs drawn and patient cleared for CT by Dr. Regenia Skeeter. Patient to CT with team. NIHSS 0, see documentation for details and code stroke times.  The following imaging was completed:  CT. Patient re-evaluated by MD Bhagat and noted to have double vision. Decision made to give tPA.   Pharmacy made aware. tPA mixed at the bedside and started. Care Handoff given to [place], [place].    Kathrin Greathouse  Stroke Response RN

## 2020-01-22 NOTE — H&P (Signed)
Neurology Consultation Reason for Consult: Dizziness  Requesting Physician: Sherwood Gambler  CC: Dizziness   History is obtained from: Patient and chart review   HPI: Megan Lane is a 64 y.o. female with a past medical history significant for hypertension, hyperlipidemia, diabetes, childhood asthma (not requiring medications at this time).  She reports that she was in her usual state of health this morning, working at [employer] with a neutral head position when she suddenly had onset of dizziness and felt like the world was spinning around her.  This was associated with some mild nausea, and intermittent headache, and she has had some mild chest pain that is worse with a deep breath (which is not a new problem for her).  There has been a waxing and waning quality to her symptoms.  On my evaluation, her neurological examination was overall reassuring but then I noted she had a head tilt to the left with mild chin tuck.  When I help to put her head into a neutral position she reported binocular double vision that was vertical and slightly skewed.  This improved again with head tilt.   Discussed with the patient and family that she may have a microvascular ischemic insult to her trochlear nerve versus a brainstem stroke (presented in layman's terms).  Discussed risks and benefits of tPA and patient elected to proceed with this after confirming she met all criteria for tPA administration.  On review of systems the patient denied frequent headaches, prior double vision, hearing loss, speech or swallow difficulties, focal numbness or tingling (other than some mild right lateral hip burning pain that is chronic), fevers, chills, cough, burning when she pees, blood in her stool or urine, bloody emesis, any recent medical procedures  LKW: 9:30 AM tPA given?:  Yes, started at 11:37 AM  Premorbid modified rankin scale:      0 - No symptoms.  ROS: A 14 point ROS was performed  and is negative except as noted in the HPI.   Past Medical History:  Diagnosis Date  . Asthma    as a child  . Diabetes mellitus   . Hypercholesterolemia   . Hypertension   . Ventral hernia   . Wears glasses    Past Surgical History:  Procedure Laterality Date  . ABDOMINAL HYSTERECTOMY    . KNEE SURGERY    . LEG SURGERY     left lower extremity  . TUBAL LIGATION    . VENTRAL HERNIA REPAIR N/A 05/29/2019   Procedure: REPAIR OF VENTRAL HERNIA WITH MESH;  Surgeon: Erroll Luna, MD;  Location: [facility];  Service: General;  Laterality: N/A;   Current Outpatient Medications  Medication Instructions  . acetaminophen (TYLENOL) 1,000 mg, Oral, Every 8 hours PRN  . atorvastatin (LIPITOR) 20 mg, Oral, Daily  . dapagliflozin propanediol (FARXIGA) 10 mg, Oral, Daily before breakfast  . diclofenac sodium (VOLTAREN) 2 g, Topical, 4 times daily  . gabapentin (NEURONTIN) 600 mg, Oral, 3 times daily  . glipiZIDE (GLUCOTROL) 10 mg, Oral, 2 times daily before meals  . glucose blood test strip Use as instructed  . hydrochlorothiazide (HYDRODIURIL) 25 mg, Oral, Daily  . HYDROcodone-acetaminophen (NORCO/VICODIN) 5-325 MG tablet 1 tablet, Oral, Every 6 hours PRN  . ibuprofen (ADVIL) 600 mg, Oral, 3 times daily, With food X 7 days then prn pain  . Lancets (ACCU-CHEK SOFT TOUCH) lancets Use as instructed  . metFORMIN (GLUCOPHAGE) 1,000 mg, Oral, 2 times daily with meals  .  methocarbamol (ROBAXIN) 1,000 mg, Oral, 3 times daily, X 7 days then prn muscle spasm  . Multiple Vitamins-Minerals (MULTIVITAMIN WITH MINERALS) tablet 1 tablet, Oral, Daily, Chewables      Family History  Problem Relation Age of Onset  . Diabetes Mother   . Hypertension Mother   . Stroke Mother   . Heart disease Brother   . Breast cancer Sister 82  Ovarian cancer   Social History:  reports that she quit smoking about 14 years ago. She has never used smokeless tobacco. She reports that she does not drink alcohol and does  not use drugs.  Exam: Current vital signs: BP 133/84   Pulse 88   Temp 98.9 F (37.2 C) (Oral)   Resp (!) 22   Ht _0  (1.854 m)   Wt 116.7 kg   SpO2 99%   BMI 33.94 kg/m  Vital signs in last 24 hours: Temp:  [98.8 F (37.1 C)-98.9 F (37.2 C)] 98.9 F (37.2 C) (12/16 1038) Pulse Rate:  [88-100] 88 (12/16 1145) Resp:  [16-22] 22 (12/16 1145) BP: (120-142)/(65-90) 133/84 (12/16 1145) SpO2:  [95 %-100 %] 99 % (12/16 1145) Weight:  [106.6 kg-116.7 kg] 116.7 kg (12/16 1100)   Physical Exam  Constitutional: Appears well-developed and well-nourished.  Psych: Affect appropriate to situation Eyes: No scleral injection HENT: No OP obstrucion MSK: no joint deformities.  Cardiovascular: Normal rate and regular rhythm.  Respiratory: Effort normal, non-labored breathing GI: Soft.  No distension. There is no tenderness.  Skin: WDI  Neuro: Mental Status: Patient is awake, alert, oriented to person, place, month, year, and situation. Patient is able to give a clear and coherent history. No signs of aphasia or neglect Cranial Nerves: II: Visual Fields are full. Pupils are equal, round, and reactive to light.   III,IV, VI: EOMI without ptosis. Diplopia improved with head tilt to the left  V: Facial sensation is symmetric to temperature VII: Facial movement is symmetric.  VIII: hearing is intact to finger rub  X: Uvula not fully visualized  XI: Shoulder shrug is symmetric. XII: tongue is midline without atrophy or fasciculations.  HINTS:  Head impulse testing: Negative for catch up saccade Test of skew: No clear skew but patient had difficulty maintaining fixation Nystagmus: Negative Motor: Tone is normal. Bulk is normal. 5/5 strength was present in all four extremities.  Sensory: Sensation is symmetric to light touch and temperature in the arms and legs. Deep Tendon Reflexes: 2+ and symmetric in the biceps and patellae.  Plantars: Toes are downgoing bilaterally.   Cerebellar: FNF and HKS are intact bilaterally Gait:  Wide based and cautious casual gait; unsteady   NIHSS total 0  Repeat NIHSS 1 hr post tPA -- 0  I have reviewed labs in epic and the results pertinent to this consultation are:   I have reviewed the images obtained: CT without acute intracranial process   Impression: This is a 63 year old woman with multiple small vessel disease vascular risk factors as above.  Her exam is notable for double vision that is vertical in orientation and improves with head tilt to the left.  This localizes to the trochlear nerve on the right.  Given her risk factors, etiology may be diabetic cranial nerve palsy, however she is within the TPA window and I cannot exclude a stuttering lacunar brainstem stroke of the right midbrain.  Given that this lesion would be expected to be too small to see on MRI, I elected to proceed with tPA after  discussion of risks and benefits with the patient and her son.  Gabapentin accumulation may also be contributing to her dizziness, though it does not explain the diplopia improved with left head tilt.  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, status post tPA - Stroke labs HgbA1c, fasting lipid panel - MRI brain  - MRA of the brain without contrast and MRA neck w/wo  - Frequent neuro checks, candidate for OPTIMIST trial if her NIH stroke scale remains less than 10 - Echocardiogram - 24 h post TPA if she remains stable, would start aspirin 325 mg once followed by 81 mg daily - Consider Plavix 300 mg load with 75 mg daily for 21 - 90 day course pending vessel imaging and MRI - Risk factor modification - Telemetry monitoring; consider 30 day event monitor on discharge if no arrythmias captured based on ECHO results - Blood pressure goal   - Post tPA for 24  hours < 180/105; labetalol 10 mg  IV as needed ordered though thus far patient has been normotensive - Stat head CT and page neurology for any acute change in neuro status - PT consult, OT consult, Speech consult - Admit to stroke team   # HLD - Home atorva 20, increase to 40 mg nightly   # Diabetes - Hold home oral agents - Insulin moderate dose TID AC + QHS  # HTN - Resume home HCTZ 25 mg daily starting 12/17 assuming clinical stability  # Chronic pain - half home dose of Robaxin to allow for good neuro checks (500 instead of 1000 mg) - home gabapentin will also reduce from 600 TID to 300 mg TID for now given renal function  # AKI - Baseline Cr 1.0, currently 2.0 - s/p 1 L NS, continue to monitor  Lesleigh Noe MD-PhD [facility] [phone]  50 minutes were spent in the critical care of this patient

## 2020-01-22 NOTE — Research (Signed)
Patient qualifies for OPTIMISTmain trial and research study. Spoke to patient about utilizing patient data, questions at discharge, and 90 day questions. Pt verbalized understanding and agreed to move forward with trial protocol. Given opportunity to opt-out and declined.   Patient Information Sheet left at the bedside with patient. Protocol ordered per MD Bhagat. [place] made aware and patient switched to low intensity monitoring at 1337.   Mohammed Kindle, RN BSN SCRN

## 2020-01-22 NOTE — ED Notes (Signed)
Patient is being discharged from the [facility] and sent to the Emergency Department via wheelchair by staff. Per Dr. Meda Coffee, patient is stable but in need of higher level of care due to dizziness, lightheadedness. Patient is aware and verbalizes understanding of plan of care.  Vitals:   01/22/20 1013  BP: 124/65  Pulse: 100  Resp: 18  Temp: 98.8 F (37.1 C)  SpO2: 100%

## 2020-01-22 NOTE — Progress Notes (Signed)
  Echocardiogram 2D Echocardiogram has been performed.  Jennette Dubin 01/22/2020, 3:40 PM

## 2020-01-22 NOTE — ED Triage Notes (Addendum)
Patient complains of dizziness that started this am at work 0930. Alert and oriented, no neuro deficits. States that the dizziness is with sitting or standing. Clear speech, equal grips. No drift. Patient immediately to treatment room for MD assessment and possible stroke activation

## 2020-01-23 ENCOUNTER — Inpatient Hospital Stay (HOSPITAL_COMMUNITY): Payer: BC Managed Care – PPO

## 2020-01-23 DIAGNOSIS — E1165 Type 2 diabetes mellitus with hyperglycemia: Secondary | ICD-10-CM

## 2020-01-23 DIAGNOSIS — H4911 Fourth [trochlear] nerve palsy, right eye: Secondary | ICD-10-CM

## 2020-01-23 DIAGNOSIS — I10 Essential (primary) hypertension: Secondary | ICD-10-CM

## 2020-01-23 DIAGNOSIS — G459 Transient cerebral ischemic attack, unspecified: Principal | ICD-10-CM

## 2020-01-23 LAB — BASIC METABOLIC PANEL
Anion gap: 12 (ref 5–15)
BUN: 20 mg/dL (ref 8–23)
CO2: 29 mmol/L (ref 22–32)
Calcium: 9.3 mg/dL (ref 8.9–10.3)
Chloride: 100 mmol/L (ref 98–111)
Creatinine, Ser: 1.15 mg/dL — ABNORMAL HIGH (ref 0.44–1.00)
GFR, Estimated: 54 mL/min — ABNORMAL LOW (ref >60)
Glucose, Bld: 187 mg/dL — ABNORMAL HIGH (ref 70–99)
Potassium: 3.5 mmol/L (ref 3.5–5.1)
Sodium: 141 mmol/L (ref 135–145)

## 2020-01-23 LAB — URINALYSIS, ROUTINE W REFLEX MICROSCOPIC
Bacteria, UA: NONE SEEN
Bilirubin Urine: NEGATIVE
Glucose, UA: >=500 mg/dL — AB
Hgb urine dipstick: NEGATIVE
Ketones, ur: NEGATIVE mg/dL
Leukocytes,Ua: NEGATIVE
Nitrite: NEGATIVE
Protein, ur: NEGATIVE mg/dL
Specific Gravity, Urine: 1.012 (ref 1.005–1.030)
pH: 5 (ref 5.0–8.0)

## 2020-01-23 LAB — RAPID URINE DRUG SCREEN, HOSP PERFORMED
Amphetamines: NOT DETECTED
Barbiturates: NOT DETECTED
Benzodiazepines: NOT DETECTED
Cocaine: NOT DETECTED
Opiates: NOT DETECTED
Tetrahydrocannabinol: NOT DETECTED

## 2020-01-23 LAB — LIPID PANEL
Cholesterol: 168 mg/dL (ref 0–200)
HDL: 37 mg/dL — ABNORMAL LOW (ref >40)
LDL Cholesterol: 70 mg/dL (ref 0–99)
Total CHOL/HDL Ratio: 4.5 RATIO
Triglycerides: 303 mg/dL — ABNORMAL HIGH (ref <150)
VLDL: 61 mg/dL — ABNORMAL HIGH (ref 0–40)

## 2020-01-23 LAB — HIV ANTIBODY (ROUTINE TESTING W REFLEX): HIV Screen 4th Generation wRfx: NONREACTIVE

## 2020-01-23 LAB — HEMOGLOBIN A1C
Hgb A1c MFr Bld: 8.8 % — ABNORMAL HIGH (ref 4.8–5.6)
Mean Plasma Glucose: 205.86 mg/dL

## 2020-01-23 LAB — CBC
HCT: 39.3 % (ref 36.0–46.0)
Hemoglobin: 11.9 g/dL — ABNORMAL LOW (ref 12.0–15.0)
MCH: 25.5 pg — ABNORMAL LOW (ref 26.0–34.0)
MCHC: 30.3 g/dL (ref 30.0–36.0)
MCV: 84.2 fL (ref 80.0–100.0)
Platelets: 316 10*3/uL (ref 150–400)
RBC: 4.67 MIL/uL (ref 3.87–5.11)
RDW: 14.8 % (ref 11.5–15.5)
WBC: 4.9 10*3/uL (ref 4.0–10.5)
nRBC: 0 % (ref 0.0–0.2)

## 2020-01-23 LAB — CBG MONITORING, ED
Glucose-Capillary: 174 mg/dL — ABNORMAL HIGH (ref 70–99)
Glucose-Capillary: 153 mg/dL — ABNORMAL HIGH (ref 70–99)

## 2020-01-23 IMAGING — MR MR MRA HEAD W/O CM
13 of 17 series · 35 of 48 positions shown · non-contrast
Comparison: None.

CLINICAL DATA: Stroke

EXAM:
MRA HEAD WITHOUT CONTRAST
TECHNIQUE: Angiographic images of the Circle of Willis were obtained using MRA
technique without intravenous contrast.

[Series 5: DWI · axial · 3.0mm · 0.92mm/px · z∈[-129,+23]mm · 4 of 104 slices shown (1 of 4)]
[im 1/104]
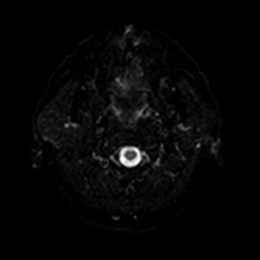
[im 35/104]
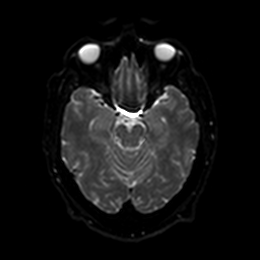
[im 69/104]
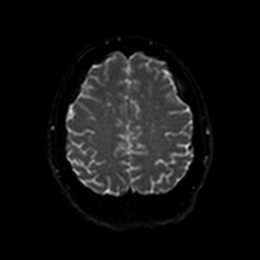
[im 104/104]
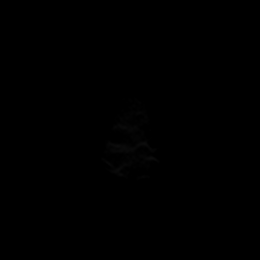

[Series 6: DWI · axial · 3.0mm · 0.92mm/px · z∈[-129,+23]mm · 2 of 52 slices shown (2 of 4)]
[im 1/52]
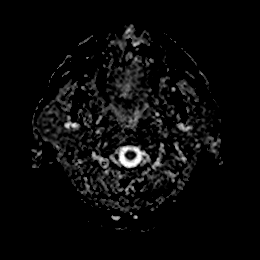
[im 52/52]
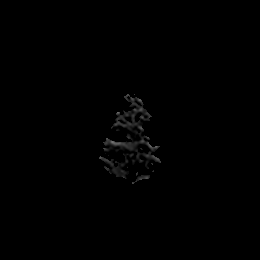

[Series 7: DWI · coronal · 4.0mm · 0.88mm/px · 3 of 64 slices shown (3 of 4)]
[im 1/64]
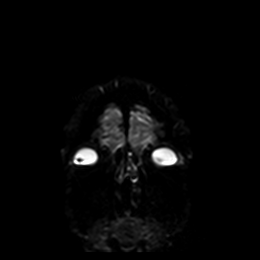
[im 32/64]
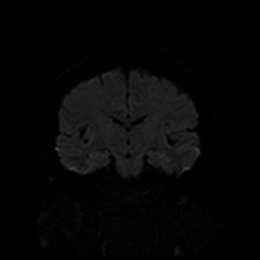
[im 64/64]
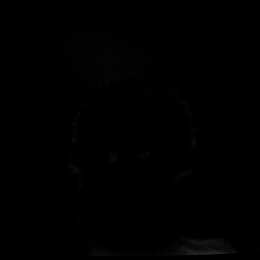

[Series 8: DWI · coronal · 4.0mm · 0.88mm/px · 1 of 32 slices shown (4 of 4)]
[im 1/32]
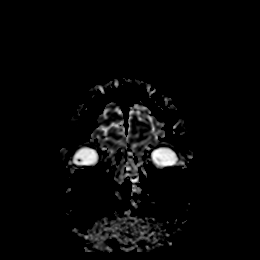

[Series 17: tof_fl3d_tra_iso · axial · 0.6mm · 0.52mm/px · z∈[-247,-119]mm · 10 of 214 slices shown]
[im 1/214]
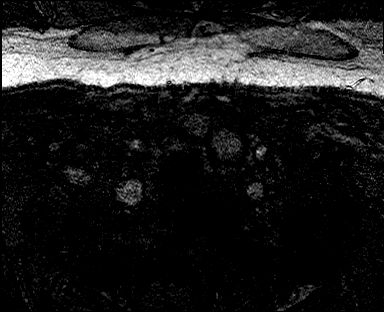
[im 24/214]
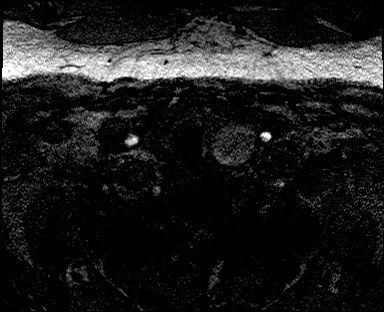
[im 48/214]
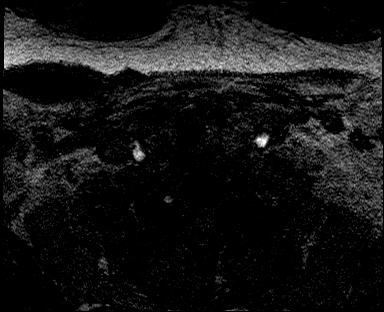
[im 72/214]
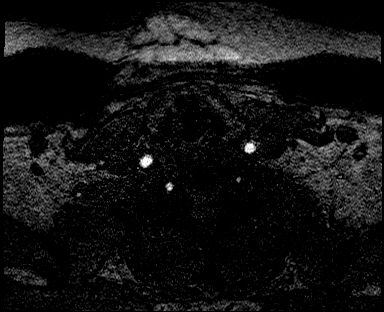
[im 95/214]
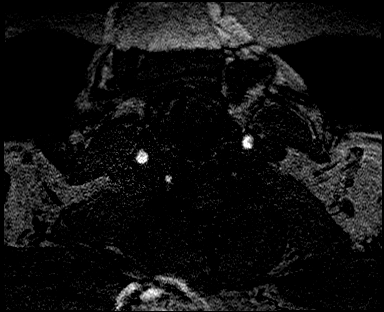
[im 119/214]
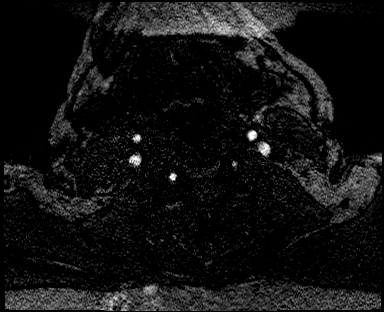
[im 143/214]
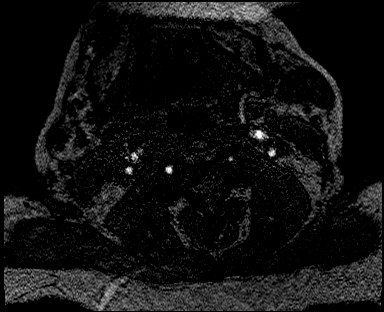
[im 166/214]
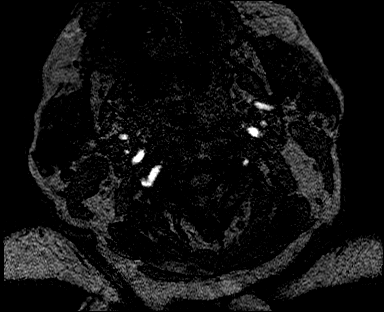
[im 190/214]
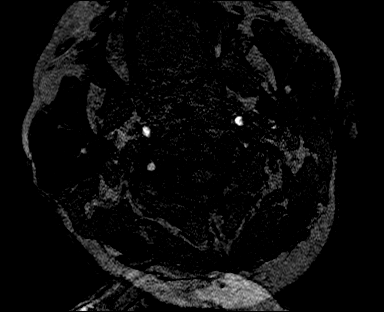
[im 214/214]
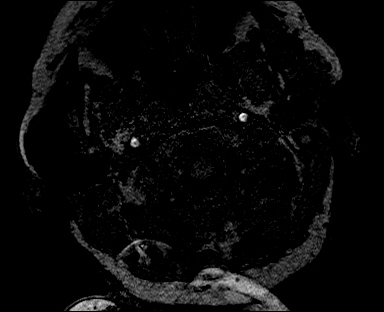

[Series 20: T1 · sagittal · 5.0mm · 0.75mm/px · 1 of 23 slices shown]
[im 1/23]
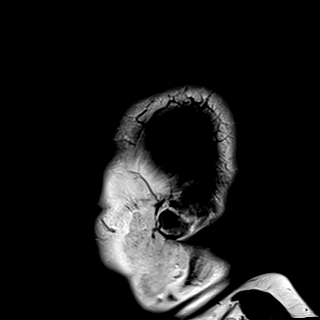

[Series 21: T2 · axial · 5.0mm · 0.72mm/px · 1 of 25 slices shown (1 of 2)]
[im 1/25]
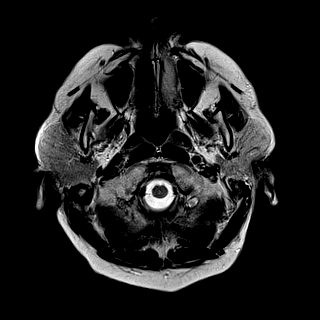

[Series 22: FLAIR · axial · 5.0mm · 0.45mm/px · 1 of 25 slices shown]
[im 1/25]
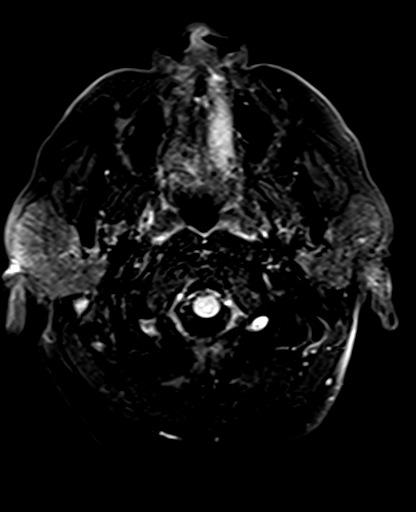

[Series 23: mag_images · axial · 3.0mm · 0.90mm/px · z∈[-141,+35]mm · 3 of 60 slices shown]
[im 1/60]
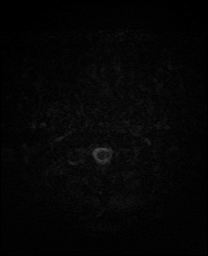
[im 30/60]
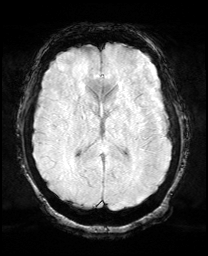
[im 60/60]
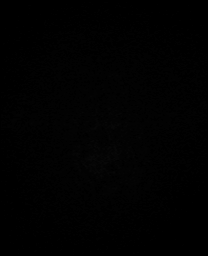

[Series 24: pha_images · axial · 3.0mm · 0.90mm/px · z∈[-138,+35]mm · 3 of 58 slices shown]
[im 1/58]
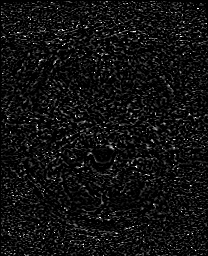
[im 29/58]
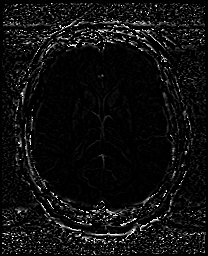
[im 58/58]
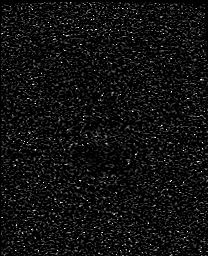

[Series 25: swi_images · axial · 3.0mm · 0.90mm/px · z∈[-141,+35]mm · 3 of 60 slices shown]
[im 1/60]
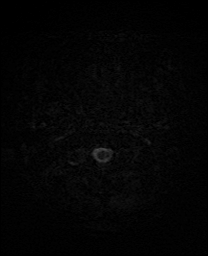
[im 30/60]
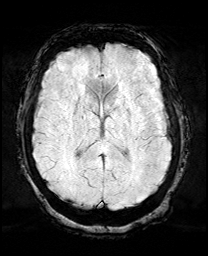
[im 60/60]
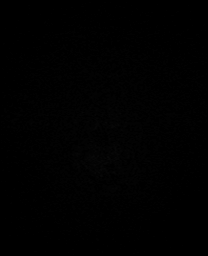

[Series 26: mip_images(sw) · axial · 24.0mm · 0.90mm/px · z∈[-131,+25]mm · 2 of 53 slices shown]
[im 1/53]
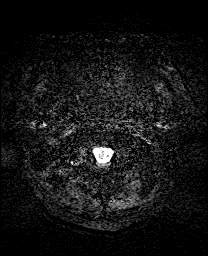
[im 53/53]
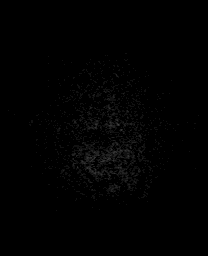

[Series 28: T2 · coronal · 5.0mm · 0.34mm/px · 1 of 29 slices shown (2 of 2)]
[im 1/29]
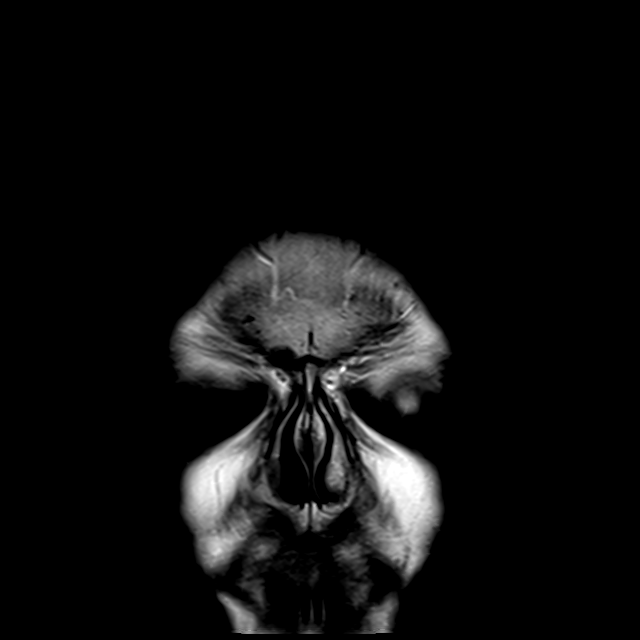

[35 of 48 positions shown; findings below may reference images not displayed]

FINDINGS: Right vertebral artery is dominant and supplies the basilar. Right
PICA not visualized but may not be included in the study. Left
vertebral artery ends in PICA. Basilar widely patent. AICA, superior
cerebellar, and posterior cerebral arteries are patent bilaterally
without stenosis or large vessel occlusion.

Internal carotid artery widely patent bilaterally without stenosis.
Anterior and middle cerebral arteries normal bilaterally.
IMPRESSION: Negative MRA head.

## 2020-01-23 IMAGING — MR MR MRA NECK W/O CM
13 of 17 series · 35 of 48 positions shown · non-contrast
Comparison: None.

CLINICAL DATA: Stroke

EXAM:
MRA NECK WITHOUT  CONTRAST
TECHNIQUE: Multiplanar and multiecho pulse sequences of the neck were obtained
without intravenous contrast. Angiographic images of the neck were
obtained using MRA technique without and with intravenous contrast.

[Series 5: DWI · axial · 3.0mm · 0.92mm/px · z∈[-129,+23]mm · 4 of 104 slices shown (1 of 4)]
[im 1/104]
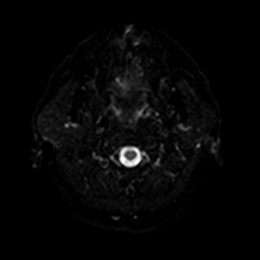
[im 35/104]
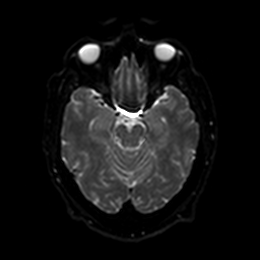
[im 69/104]
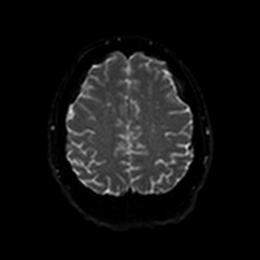
[im 104/104]
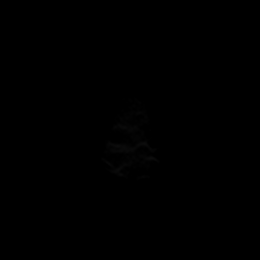

[Series 6: DWI · axial · 3.0mm · 0.92mm/px · z∈[-129,+23]mm · 2 of 52 slices shown (2 of 4)]
[im 1/52]
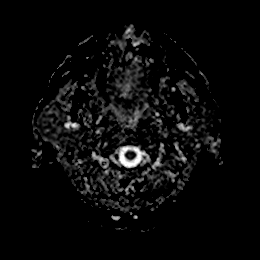
[im 52/52]
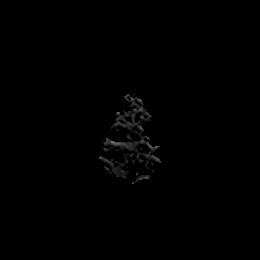

[Series 7: DWI · coronal · 4.0mm · 0.88mm/px · 3 of 64 slices shown (3 of 4)]
[im 1/64]
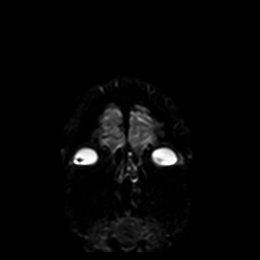
[im 32/64]
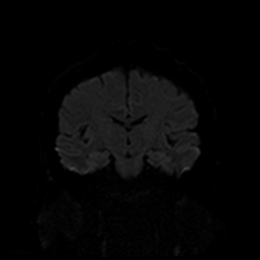
[im 64/64]
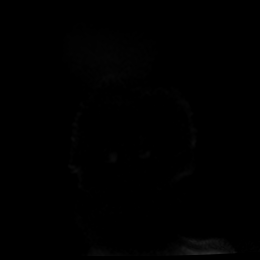

[Series 8: DWI · coronal · 4.0mm · 0.88mm/px · 1 of 32 slices shown (4 of 4)]
[im 1/32]
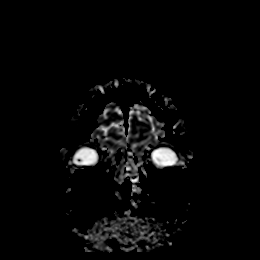

[Series 17: tof_fl3d_tra_iso · axial · 0.6mm · 0.52mm/px · z∈[-247,-119]mm · 10 of 214 slices shown]
[im 1/214]
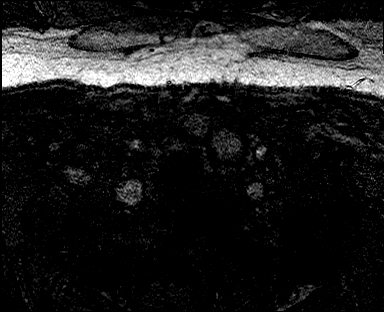
[im 24/214]
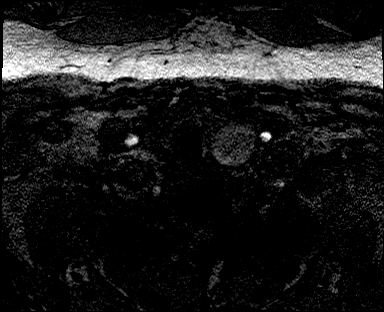
[im 48/214]
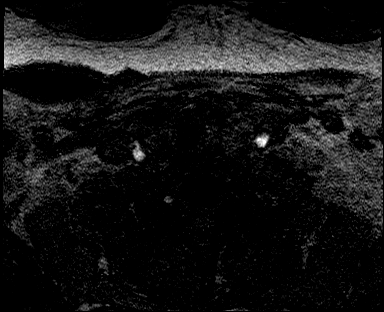
[im 72/214]
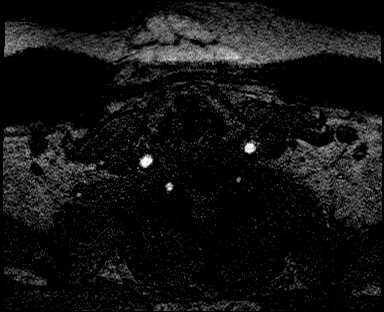
[im 95/214]
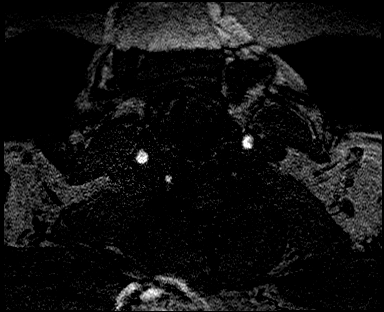
[im 119/214]
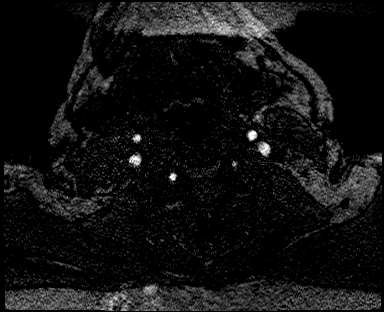
[im 143/214]
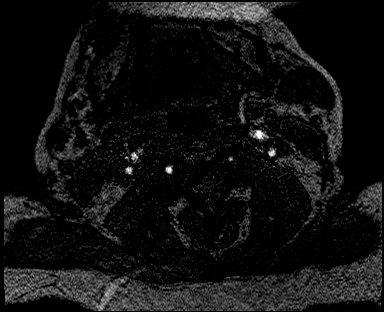
[im 166/214]
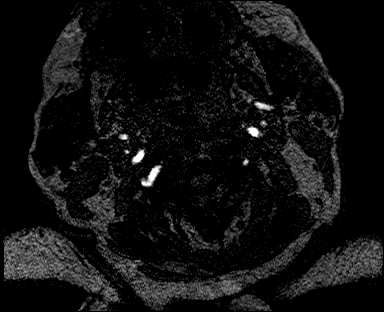
[im 190/214]
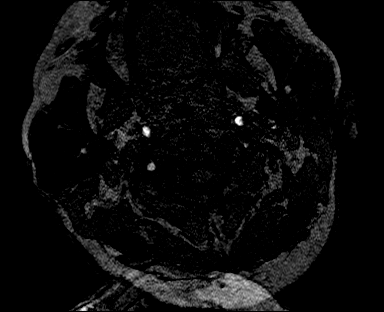
[im 214/214]
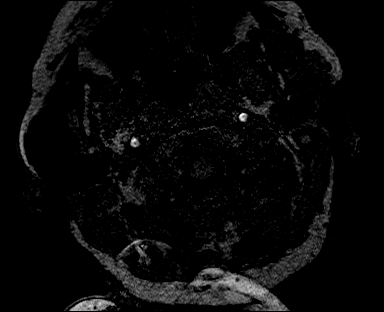

[Series 20: T1 · sagittal · 5.0mm · 0.75mm/px · 1 of 23 slices shown]
[im 1/23]
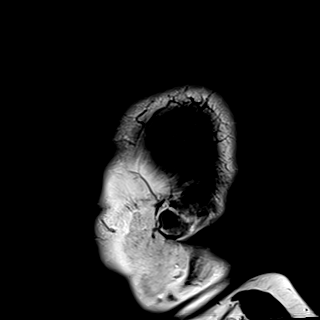

[Series 21: T2 · axial · 5.0mm · 0.72mm/px · 1 of 25 slices shown (1 of 2)]
[im 1/25]
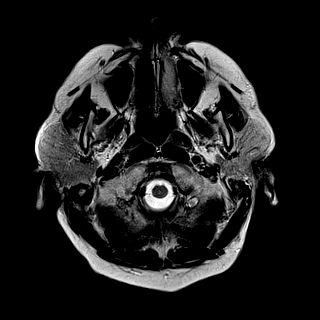

[Series 22: FLAIR · axial · 5.0mm · 0.45mm/px · 1 of 25 slices shown]
[im 1/25]
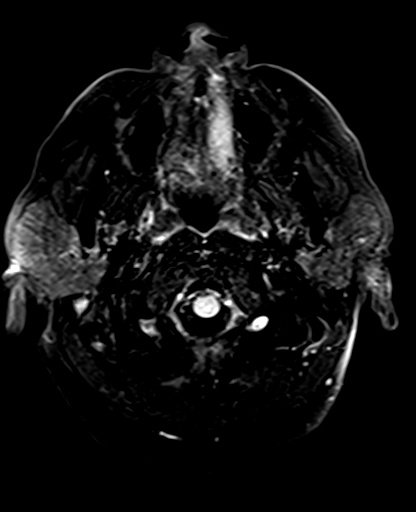

[Series 23: mag_images · axial · 3.0mm · 0.90mm/px · z∈[-141,+35]mm · 3 of 60 slices shown]
[im 1/60]
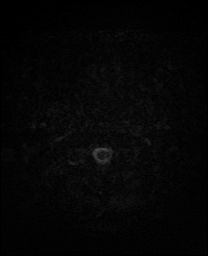
[im 30/60]
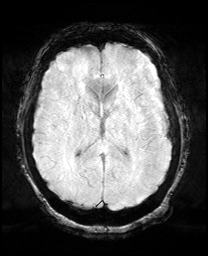
[im 60/60]
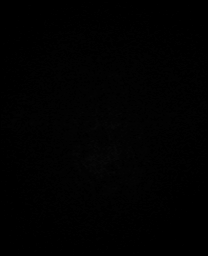

[Series 24: pha_images · axial · 3.0mm · 0.90mm/px · z∈[-138,+35]mm · 3 of 58 slices shown]
[im 1/58]
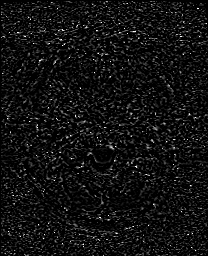
[im 29/58]
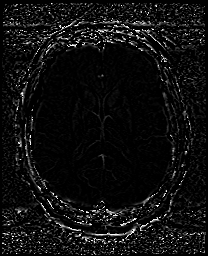
[im 58/58]
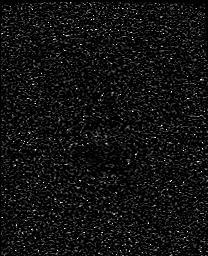

[Series 25: swi_images · axial · 3.0mm · 0.90mm/px · z∈[-141,+35]mm · 3 of 60 slices shown]
[im 1/60]
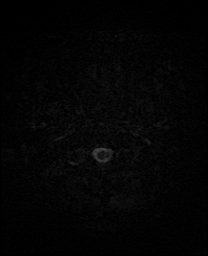
[im 30/60]
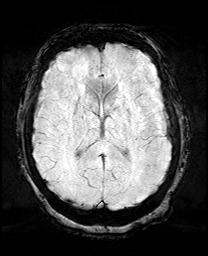
[im 60/60]
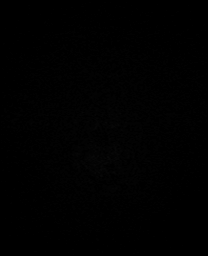

[Series 26: mip_images(sw) · axial · 24.0mm · 0.90mm/px · z∈[-131,+25]mm · 2 of 53 slices shown]
[im 1/53]
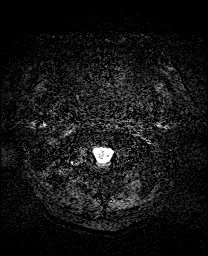
[im 53/53]
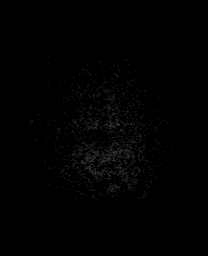

[Series 28: T2 · coronal · 5.0mm · 0.34mm/px · 1 of 29 slices shown (2 of 2)]
[im 1/29]
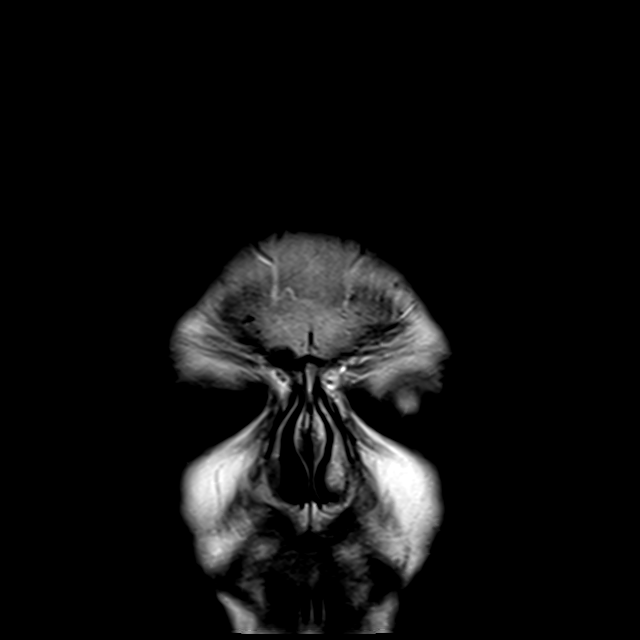

[35 of 48 positions shown; findings below may reference images not displayed]

FINDINGS: Antegrade flow in carotid and vertebral arteries bilaterally.

Carotid bifurcation widely patent bilaterally.

Right vertebral artery widely patent. Decreased single proximal left
vertebral artery likely due to artifact and small vessel. On the
axial source images, this vessel appears patent.
IMPRESSION: Negative MRA neck without contrast.

## 2020-01-23 IMAGING — MR MR HEAD W/O CM
13 of 17 series · 35 of 48 positions shown · non-contrast
Comparison: CT head [DATE]

CLINICAL DATA: Stroke.  Post tPA

EXAM:
MRI HEAD WITHOUT CONTRAST
TECHNIQUE: Multiplanar, multiecho pulse sequences of the brain and surrounding
structures were obtained without intravenous contrast.

[Series 5: DWI · axial · 3.0mm · 0.92mm/px · z∈[-129,+23]mm · 4 of 104 slices shown (1 of 4)]
[im 1/104]
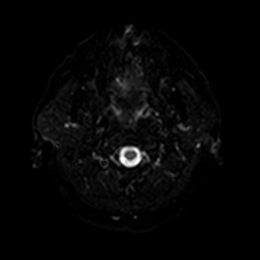
[im 35/104]
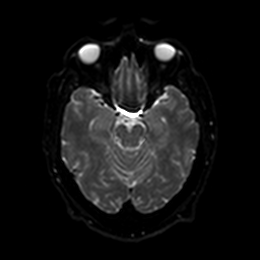
[im 69/104]
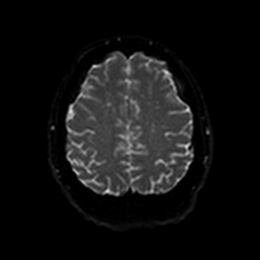
[im 104/104]
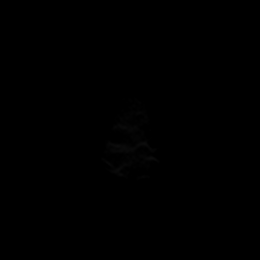

[Series 6: DWI · axial · 3.0mm · 0.92mm/px · z∈[-129,+23]mm · 2 of 52 slices shown (2 of 4)]
[im 1/52]
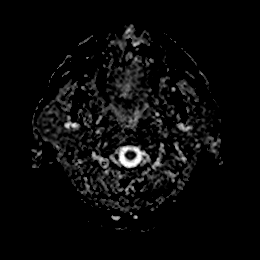
[im 52/52]
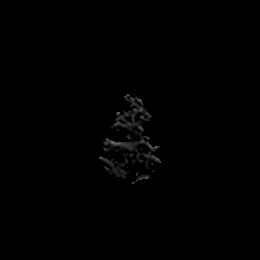

[Series 7: DWI · coronal · 4.0mm · 0.88mm/px · 3 of 64 slices shown (3 of 4)]
[im 1/64]
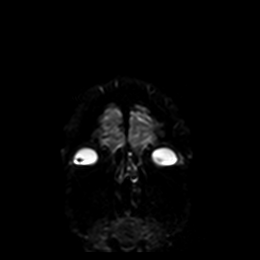
[im 32/64]
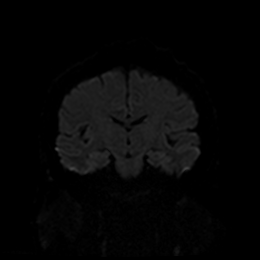
[im 64/64]
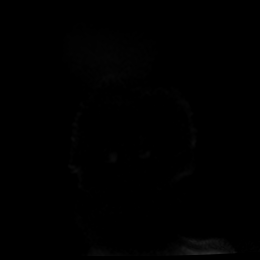

[Series 8: DWI · coronal · 4.0mm · 0.88mm/px · 1 of 32 slices shown (4 of 4)]
[im 1/32]
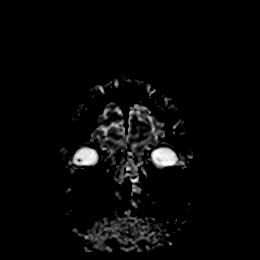

[Series 17: tof_fl3d_tra_iso · axial · 0.6mm · 0.52mm/px · z∈[-247,-119]mm · 10 of 214 slices shown]
[im 1/214]
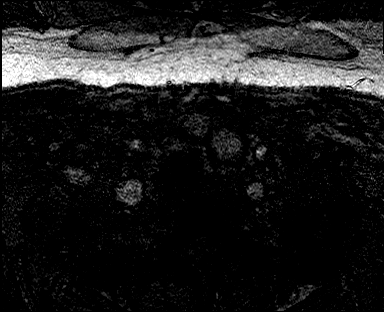
[im 24/214]
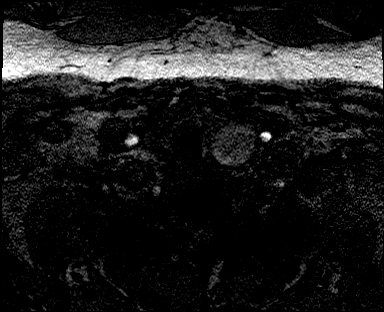
[im 48/214]
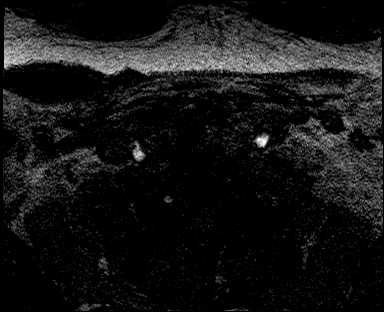
[im 72/214]
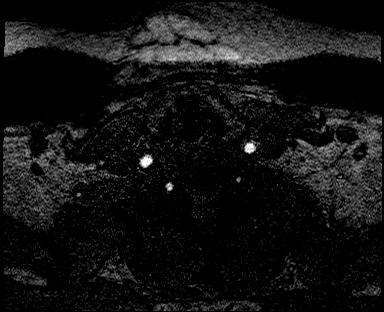
[im 95/214]
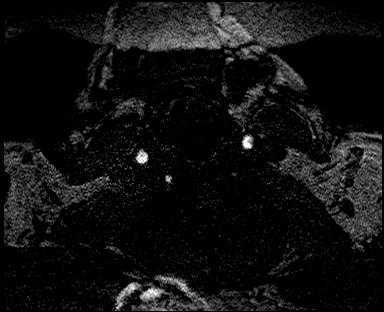
[im 119/214]
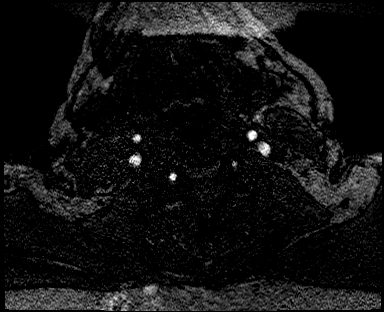
[im 143/214]
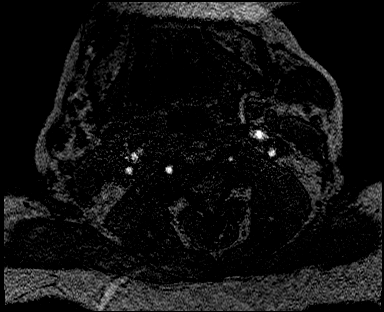
[im 166/214]
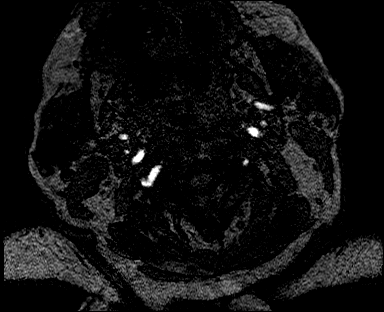
[im 190/214]
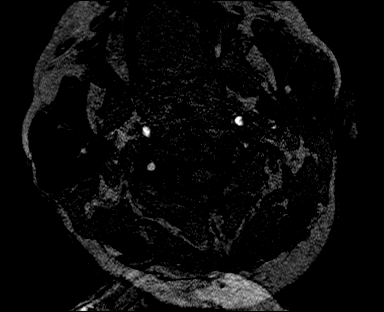
[im 214/214]
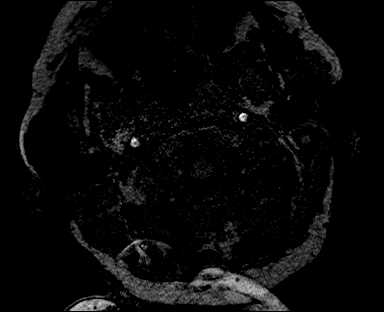

[Series 20: T1 · sagittal · 5.0mm · 0.75mm/px · 1 of 23 slices shown]
[im 1/23]
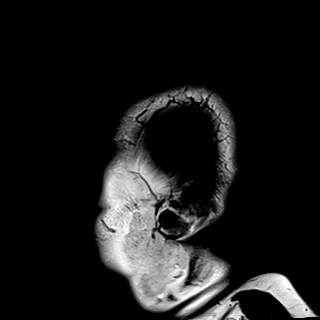

[Series 21: T2 · axial · 5.0mm · 0.72mm/px · 1 of 25 slices shown (1 of 2)]
[im 1/25]
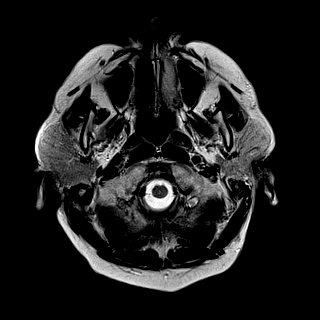

[Series 22: FLAIR · axial · 5.0mm · 0.45mm/px · 1 of 25 slices shown]
[im 1/25]
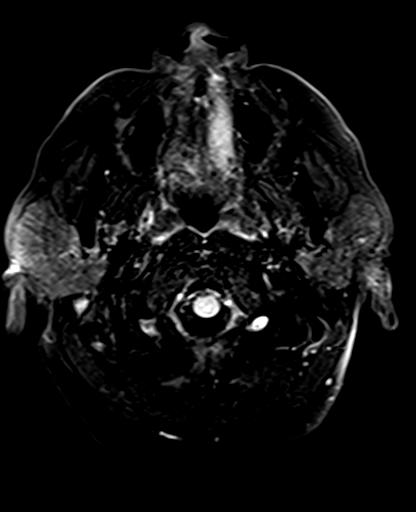

[Series 23: mag_images · axial · 3.0mm · 0.90mm/px · z∈[-141,+35]mm · 3 of 60 slices shown]
[im 1/60]
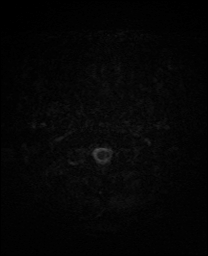
[im 30/60]
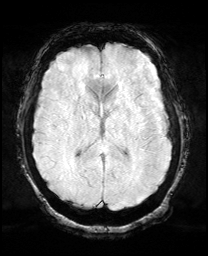
[im 60/60]
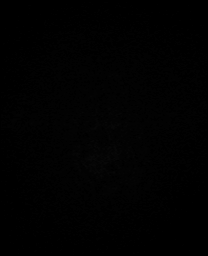

[Series 24: pha_images · axial · 3.0mm · 0.90mm/px · z∈[-138,+35]mm · 3 of 58 slices shown]
[im 1/58]
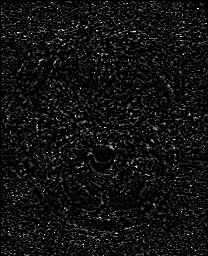
[im 29/58]
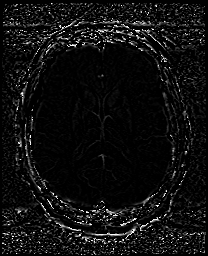
[im 58/58]
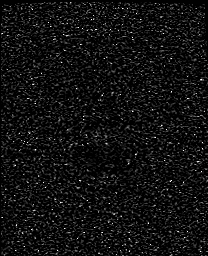

[Series 25: swi_images · axial · 3.0mm · 0.90mm/px · z∈[-141,+35]mm · 3 of 60 slices shown]
[im 1/60]
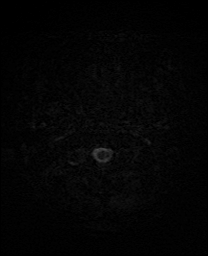
[im 30/60]
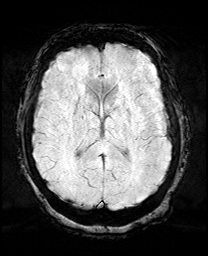
[im 60/60]
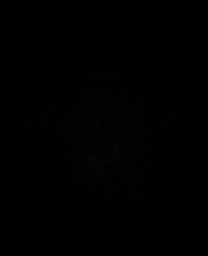

[Series 26: mip_images(sw) · axial · 24.0mm · 0.90mm/px · z∈[-131,+25]mm · 2 of 53 slices shown]
[im 1/53]
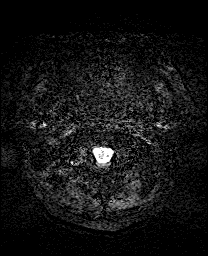
[im 53/53]
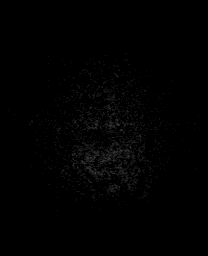

[Series 28: T2 · coronal · 5.0mm · 0.34mm/px · 1 of 29 slices shown (2 of 2)]
[im 1/29]
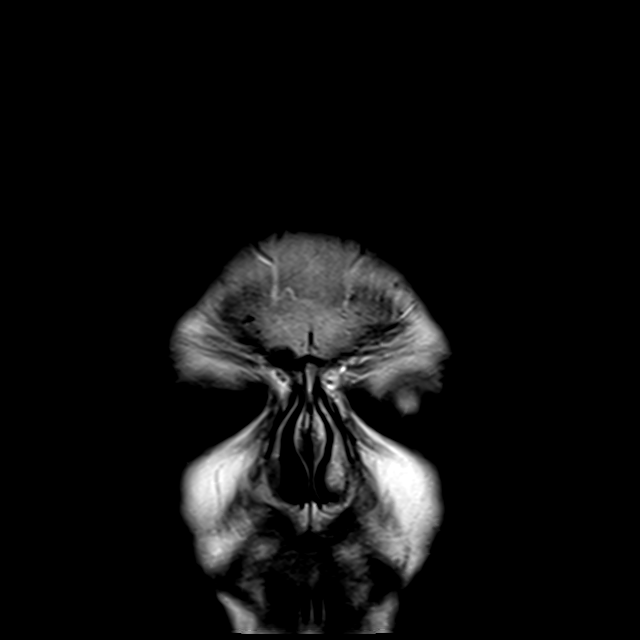

[35 of 48 positions shown; findings below may reference images not displayed]

FINDINGS: Brain: No areas of restricted diffusion. Negative for acute infarct.
Scattered small white matter hyperintensities, mild in degree.

Ventricle size and cerebral volume normal. No intracranial
hemorrhage mass or edema.

Vascular: Normal arterial flow voids

Skull and upper cervical spine: No focal skeletal abnormality.

Sinuses/Orbits: Paranasal sinuses clear.  Negative orbit

Other: None
IMPRESSION: Negative for acute infarct. Mild chronic microvascular ischemic
change in the white matter. Negative for hemorrhage.

## 2020-01-23 MED ORDER — CLOPIDOGREL BISULFATE 75 MG PO TABS: 75.0000 mg | ORAL_TABLET | Freq: Every day | ORAL | 0 refills | Status: DC

## 2020-01-23 MED ORDER — ASPIRIN EC 81 MG PO TBEC: 81.0000 mg | DELAYED_RELEASE_TABLET | Freq: Every day | ORAL | 11 refills | Status: DC

## 2020-01-23 NOTE — Progress Notes (Signed)
Occupational Therapy Evaluation  PTA pt lives with her son, is independent and works at [employer]. Pt appears at her baseline without visual deficits. Pt complaining of R hip pain and R great toe swelling/numbness to be addressed by outpt PT. No further OT needed. OT signing off.     01/23/20 1500  OT Visit Information  Last OT Received On 01/23/20  Assistance Needed +1  PT/OT/SLP Co-Evaluation/Treatment Yes  Reason for Co-Treatment For patient/therapist safety;To address functional/ADL transfers  OT goals addressed during session ADL's and self-care  History of Present Illness Pt is a 64 y/o female admitted secondary to sudden onset dizziness and double vision. 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 Pt is s/p tPA. PMH includes HTN, and DM.  Precautions  Precautions None  Home Living  Family/patient expects to be discharged to: Private residence  Living Arrangements Children  Available Help at Discharge Family;Available PRN/intermittently  Type of Home House  Home Access Stairs to enter  Entrance Stairs-Number of Steps 2  Entrance Stairs-Rails Left;Right  Home Layout One level  Bathroom Shower/Tub Tub/shower unit;Curtain  Corporate treasurer Yes  Home Equipment None  Prior Function  Level of Independence Independent  Comments Working at [employer]  Communication  Communication No difficulties  Pain Assessment  Pain Assessment 0-10  Pain Score 10  Pain Location R hip  Pain Descriptors / Indicators Aching  Pain Intervention(s) Limited activity within patient's tolerance  Cognition  Arousal/Alertness Awake/alert  Behavior During Therapy WFL for tasks assessed/performed  Overall Cognitive Status Within Functional Limits for tasks assessed  Upper Extremity Assessment  Upper Extremity Assessment Overall WFL for tasks assessed  Lower Extremity Assessment  Lower Extremity  Assessment Defer to PT evaluation  RLE Deficits / Details Reports R sciatic pain throughout RLE. Also reporting swelling in R toe  Cervical / Trunk Assessment  Cervical / Trunk Assessment Normal  ADL  Overall ADL's  At baseline  Vision- History  Baseline Vision/History No visual deficits  Patient Visual Report Diplopia (verticle)  Vision- Assessment  Vision Assessment? Yes  Eye Alignment WFL  Ocular Range of Motion [facility]  Alignment/Gaze Preference WDL  Tracking/Visual Pursuits Able to track stimulus in all quads without difficulty  Saccades WFL  Convergence WFL  Visual Fields No apparent deficits  Diplopia Assessment Other (comment) (resolved)  Depth Perception  [facility])  Perception  Perception Tested? Yes  Comments WFL  Praxis  Praxis tested? WFL  Bed Mobility  Overal bed mobility Modified Independent  Transfers  Overall transfer level Modified independent  Balance  Overall balance assessment Mild deficits observed, not formally tested (due to R hip pain and L Great toe pain)  General Comments  General comments (skin integrity, edema, etc.) son present; education regarding [employer]  OT - End of Session  Equipment Utilized During Treatment Gait belt  Activity Tolerance Patient tolerated treatment well  Patient left in bed;with call bell/phone within reach;with family/visitor present  Nurse Communication Mobility status  OT Assessment  OT Recommendation/Assessment Patient does not need any further OT services  OT Visit Diagnosis Other abnormalities of gait and mobility (R26.89);Low vision, both eyes (H54.2)  OT Problem List Obesity  AM-PAC OT "6 Clicks" Daily Activity Outcome Measure (Version 2)  Help from another person eating meals? 4  Help from another person taking care of personal grooming? 4  Help from another person toileting, which includes using toliet, bedpan, or urinal? 4  Help from  another person bathing (including washing, rinsing, drying)? 4  Help from another  person to put on and taking off regular upper body clothing? 4  Help from another person to put on and taking off regular lower body clothing? 4  6 Click Score 24  OT Recommendation  Follow Up Recommendations No OT follow up  OT Equipment None recommended by OT  Acute Rehab OT Goals  Patient Stated Goal to go home  OT Goal Formulation All assessment and education complete, DC therapy  OT Time Calculation  OT Start Time (ACUTE ONLY) 1240  OT Stop Time (ACUTE ONLY) 1259  OT Time Calculation (min) 19 min  OT General Charges  $OT Visit 1 Visit  OT Evaluation  $OT Eval Low Complexity 1 Low  Written Expression  Dominant Hand Right  Maurie Boettcher, OT/L   Acute OT Clinical Specialist Acute Rehabilitation Services Pager [phone] Office [phone]

## 2020-01-23 NOTE — Evaluation (Addendum)
Physical Therapy Evaluation Patient Details Name: Megan Lane MRN: [id number] DOB: 01/17/1956 Today's Date: 01/23/2020   History of Present Illness  Pt is a 64 y/o female admitted secondary to sudden onset dizziness and double vision. 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 Pt is s/p tPA. PMH includes HTN, and DM.  Clinical Impression  Pt admitted secondary to problem above with deficits below. Pt reports dizziness and double vision have resolved. Reports R sciatic nerve pain at baseline that radiates throughout RLE. Tolerated mobility fairly well, however, reporting increased pain. Required min guard to supervision for safety. Recommending outpatient PT at d/c to address sciatic pain. Will continue to follow acutely.     Follow Up Recommendations Outpatient PT    Equipment Recommendations  None recommended by PT    Recommendations for Other Services       Precautions / Restrictions Precautions Precautions: None Restrictions Weight Bearing Restrictions: No      Mobility  Bed Mobility Overal bed mobility: Needs Assistance Bed Mobility: Supine to Sit;Sit to Supine     Supine to sit: Supervision Sit to supine: Supervision   General bed mobility comments: Supervision for safety and line management.    Transfers Overall transfer level: Needs assistance Equipment used: None Transfers: Sit to/from Stand Sit to Stand: Supervision         General transfer comment: Supervision for safety.  Ambulation/Gait Ambulation/Gait assistance: Supervision;Min guard Gait Distance (Feet): 150 Feet Assistive device: None Gait Pattern/deviations: Decreased weight shift to right;Antalgic Gait velocity: Decreased   General Gait Details: Antalgic gait secondary to RLE pain. Pt with L toe drag, likely from favoring RLE. Was able to perform higher step height on L with cues. No LOB noted and pt reports she did  not want to use RW.  Stairs            Wheelchair Mobility    Modified Rankin (Stroke Patients Only) Modified Rankin (Stroke Patients Only) Pre-Morbid Rankin Score: No significant disability Modified Rankin: No significant disability     Balance Overall balance assessment: Mild deficits observed, not formally tested                                           Pertinent Vitals/Pain Pain Assessment: 0-10 Pain Score: 10-Worst pain ever Pain Location: R side Pain Descriptors / Indicators: Aching Pain Intervention(s): Limited activity within patient's tolerance;Monitored during session;Repositioned    Home Living Family/patient expects to be discharged to:: Private residence Living Arrangements: Children Available Help at Discharge: Family;Available PRN/intermittently Type of Home: House Home Access: Stairs to enter Entrance Stairs-Rails: Chemical engineer of Steps: 2 Home Layout: One level Home Equipment: None      Prior Function Level of Independence: Independent         Comments: Working at [employer]     [employer]        Extremity/Trunk Assessment   Upper Extremity Assessment Upper Extremity Assessment: Defer to OT evaluation    Lower Extremity Assessment Lower Extremity Assessment: RLE deficits/detail RLE Deficits / Details: Reports R sciatic pain throughout RLE. Also reporting swelling in R toe    Cervical / Trunk Assessment Cervical / Trunk Assessment: Normal  Communication   Communication: No difficulties  Cognition Arousal/Alertness: Awake/alert Behavior During Therapy: WFL for tasks assessed/performed Overall Cognitive Status: Within Functional Limits for tasks assessed  General Comments General comments (skin integrity, edema, etc.): Pt's son present during session    Exercises Other Exercises Other Exercises: Educated about nerve gliding  exercises in RLE to help with pain management.   Assessment/Plan    PT Assessment Patient needs continued PT services  PT Problem List Decreased strength;Decreased activity tolerance;Decreased mobility;Pain       PT Treatment Interventions Gait training;Stair training;Functional mobility training;Therapeutic activities;Balance training;Therapeutic exercise;Patient/family education    PT Goals (Current goals can be found in the Care Plan section)  Acute Rehab PT Goals Patient Stated Goal: to go home PT Goal Formulation: With patient Time For Goal Achievement: 02/06/20 Potential to Achieve Goals: Good    Frequency Min 3X/week   Barriers to discharge        Co-evaluation PT/OT/SLP Co-Evaluation/Treatment: Yes Reason for Co-Treatment: For patient/therapist safety PT goals addressed during session: Balance;Mobility/safety with mobility         AM-PAC PT "6 Clicks" Mobility  Outcome Measure Help needed turning from your back to your side while in a flat bed without using bedrails?: None Help needed moving from lying on your back to sitting on the side of a flat bed without using bedrails?: None Help needed moving to and from a bed to a chair (including a wheelchair)?: A Little Help needed standing up from a chair using your arms (e.g., wheelchair or bedside chair)?: None Help needed to walk in hospital room?: A Little Help needed climbing 3-5 steps with a railing? : A Little 6 Click Score: 21    End of Session Equipment Utilized During Treatment: Gait belt Activity Tolerance: Patient tolerated treatment well Patient left: in bed;with call bell/phone within reach (on stretcher in ED) Nurse Communication: Mobility status PT Visit Diagnosis: Other abnormalities of gait and mobility (R26.89);Pain Pain - Right/Left: Right Pain - part of body: Leg    Time: 1241-1257 PT Time Calculation (min) (ACUTE ONLY): 16 min   Charges:   PT Evaluation $PT Eval Low Complexity: 1 Low           Lou Miner, DPT  Acute Rehabilitation Services  Pager: [phone] Office: [phone]   Rudean Hitt 01/23/2020, 2:00 PM

## 2020-01-23 NOTE — ED Notes (Signed)
Patient continues to take monitor off despite education.

## 2020-01-23 NOTE — Discharge Summary (Addendum)
Patient ID: Megan Lane   MRN: [id number]      DOB: 06/28/1955  Date of Admission: 01/22/2020 Date of Discharge: 01/23/2020  Attending Physician:  No att. providers found, Stroke MD Consultant(s):   none Patient's PCP:  Megan Rakes, MD  DISCHARGE DIAGNOSIS:  Active Problems: Trochlear nerve disease, right TIA  Secondary diagnosis:  Poorly controlled diabetes  AKI  Obesity  HTN  HLD  Past Medical History:  Diagnosis Date  . Asthma    as a child  . Diabetes mellitus   . Hypercholesterolemia   . Hypertension   . Ventral hernia   . Wears glasses    Past Surgical History:  Procedure Laterality Date  . ABDOMINAL HYSTERECTOMY    . KNEE SURGERY    . LEG SURGERY     left lower extremity  . TUBAL LIGATION    . VENTRAL HERNIA REPAIR N/A 05/29/2019   Procedure: REPAIR OF VENTRAL HERNIA WITH MESH;  Surgeon: Megan Luna, MD;  Location: [facility];  Service: General;  Laterality: N/A;    Family History Family History  Problem Relation Age of Onset  . Diabetes Mother   . Hypertension Mother   . Stroke Mother   . Heart disease Brother   . Breast cancer Sister 21    Social History  reports that she quit smoking about 14 years ago. She has never used smokeless tobacco. She reports that she does not drink alcohol and does not use drugs.  Allergies as of 01/23/2020      Reactions   Penicillins Rash   Did it involve swelling of the face/tongue/throat, SOB, or low BP? {Y/N/UNK:3040802 Did it involve sudden or severe rash/hives, skin peeling, or any reaction on the inside of your mouth or nose? Y Did you need to seek medical attention at a hospital or doctor's office? Y When did it last happen?Several years ago If all above answers are "NO", may proceed with cephalosporin use.      Medication List    STOP taking these medications   acetaminophen 500 MG tablet Commonly known as: TYLENOL   diclofenac sodium 1 % Gel Commonly known as: VOLTAREN      TAKE these medications   accu-chek soft touch lancets Use as instructed   aspirin EC 81 MG tablet Take 1 tablet (81 mg total) by mouth daily. Swallow whole.   atorvastatin 20 MG tablet Commonly known as: LIPITOR Take 1 tablet (20 mg total) by mouth daily.   clopidogrel 75 MG tablet Commonly known as: PLAVIX Take 1 tablet (75 mg total) by mouth daily.   dapagliflozin propanediol 10 MG Tabs tablet Commonly known as: Farxiga Take 1 tablet (10 mg total) by mouth daily before breakfast.   gabapentin 300 MG capsule Commonly known as: NEURONTIN Take 2 capsules (600 mg total) by mouth 3 (three) times daily.   glipiZIDE 10 MG tablet Commonly known as: GLUCOTROL Take 1 tablet (10 mg total) by mouth 2 (two) times daily before a meal.   glucose blood test strip Use as instructed   hydrochlorothiazide 25 MG tablet Commonly known as: HYDRODIURIL Take 1 tablet (25 mg total) by mouth daily.   HYDROcodone-acetaminophen 5-325 MG tablet Commonly known as: NORCO/VICODIN Take 1 tablet by mouth every 6 (six) hours as needed for moderate pain.   ibuprofen 600 MG tablet Commonly known as: ADVIL Take 1 tablet (600 mg total) by mouth 3 (three) times daily. With food X 7 days then prn pain What  changed:   when to take this  reasons to take this   metFORMIN 1000 MG tablet Commonly known as: GLUCOPHAGE Take 1 tablet (1,000 mg total) by mouth 2 (two) times daily with a meal.   methocarbamol 500 MG tablet Commonly known as: Robaxin Take 2 tablets (1,000 mg total) by mouth 3 (three) times daily. X 7 days then prn muscle spasm What changed:   when to take this  additional instructions       Megan Lane (Not in a hospital admission)    HOSPITAL MEDICATIONS .  stroke: mapping our early stages of recovery book   Does not apply Once  . atorvastatin  20 mg Oral QHS  . gabapentin  300 mg Oral TID  . insulin aspart  0-15 Units Subcutaneous TID WC  . insulin  aspart  0-5 Units Subcutaneous QHS  . methocarbamol  500 mg Oral QHS    LABORATORY STUDIES CBC    Component Value Date/Time   WBC 4.9 01/23/2020 0746   RBC 4.67 01/23/2020 0746   HGB 11.9 (L) 01/23/2020 0746   HCT 39.3 01/23/2020 0746   PLT 316 01/23/2020 0746   MCV 84.2 01/23/2020 0746   MCH 25.5 (L) 01/23/2020 0746   MCHC 30.3 01/23/2020 0746   RDW 14.8 01/23/2020 0746   LYMPHSABS 3.2 01/22/2020 1057   MONOABS 0.7 01/22/2020 1057   EOSABS 0.2 01/22/2020 1057   BASOSABS 0.1 01/22/2020 1057   CMP    Component Value Date/Time   NA 141 01/23/2020 0746   NA 140 08/18/2019 1600   K 3.5 01/23/2020 0746   CL 100 01/23/2020 0746   CO2 29 01/23/2020 0746   GLUCOSE 187 (H) 01/23/2020 0746   BUN 20 01/23/2020 0746   BUN 13 08/18/2019 1600   CREATININE 1.15 (H) 01/23/2020 0746   CREATININE 1.00 02/09/2015 1527   CALCIUM 9.3 01/23/2020 0746   PROT 7.5 01/22/2020 1057   PROT 7.3 09/03/2018 1359   ALBUMIN 3.7 01/22/2020 1057   ALBUMIN 4.4 09/03/2018 1359   AST 29 01/22/2020 1057   ALT 32 01/22/2020 1057   ALKPHOS 98 01/22/2020 1057   BILITOT 0.6 01/22/2020 1057   BILITOT <0.2 09/03/2018 1359   GFRNONAA 54 (L) 01/23/2020 0746   GFRNONAA 82 08/12/2014 1155   GFRAA 64 08/18/2019 1600   GFRAA >89 08/12/2014 1155   COAGS Lab Results  Component Value Date   INR 1.0 01/22/2020   Lipid Panel    Component Value Date/Time   CHOL 168 01/23/2020 0212   CHOL 186 09/03/2018 1359   TRIG 303 (H) 01/23/2020 0212   HDL 37 (L) 01/23/2020 0212   HDL 48 09/03/2018 1359   CHOLHDL 4.5 01/23/2020 0212   VLDL 61 (H) 01/23/2020 0212   LDLCALC 70 01/23/2020 0212   LDLCALC 94 09/03/2018 1359   HgbA1C  Lab Results  Component Value Date   HGBA1C 8.8 (H) 01/23/2020   Urinalysis    Component Value Date/Time   COLORURINE STRAW (A) 01/23/2020 0644   APPEARANCEUR CLEAR 01/23/2020 0644   LABSPEC 1.012 01/23/2020 0644   PHURINE 5.0 01/23/2020 0644   GLUCOSEU >=500 (A) 01/23/2020 0644    HGBUR NEGATIVE 01/23/2020 0644   BILIRUBINUR NEGATIVE 01/23/2020 0644   BILIRUBINUR small 08/20/2017 [place] 01/23/2020 0644   PROTEINUR NEGATIVE 01/23/2020 0644   UROBILINOGEN 1.0 08/20/2017 1528   UROBILINOGEN 0.2 08/31/2012 1150   NITRITE NEGATIVE 01/23/2020 [place] 01/23/2020 3903  Urine Drug Screen     Component Value Date/Time   LABOPIA NONE DETECTED 01/23/2020 0644   COCAINSCRNUR NONE DETECTED 01/23/2020 0644   LABBENZ NONE DETECTED 01/23/2020 0644   AMPHETMU NONE DETECTED 01/23/2020 0644   THCU NONE DETECTED 01/23/2020 0644   LABBARB NONE DETECTED 01/23/2020 0644    Alcohol Level    Component Value Date/Time   ETH <10 10-Feb-2020 1057     SIGNIFICANT DIAGNOSTIC STUDIES  ECHOCARDIOGRAM COMPLETE 02/10/20 IMPRESSIONS   1. Left ventricular ejection fraction, by estimation, is 55 to 60%. The left ventricle has normal function. The left ventricle has no regional wall motion abnormalities. Left ventricular diastolic parameters are consistent with Grade I diastolic dysfunction (impaired relaxation).   2. Right ventricular systolic function is normal. The right ventricular size is normal. There is normal pulmonary artery systolic pressure.   3. The mitral valve is normal in structure. Trivial mitral valve regurgitation. No evidence of mitral stenosis.   4. The aortic valve is tricuspid. Aortic valve regurgitation is not visualized. Mild aortic valve sclerosis is present, with no evidence of aortic valve stenosis.   CT HEAD CODE STROKE WO CONTRAST Feb 10, 2020 IMPRESSION:  1. Negative CT head  2. ASPECTS is 10   MRI Brain WO Contrast 01/23/20  IMPRESSION: Negative for acute infarct. Mild chronic microvascular ischemic change in the white matter. Negative for hemorrhage.  MRA head  01/23/20 IMPRESSION: Negative MRA head.  MRA neck  01/23/20 IMPRESSION: Negative MRA neck without contrast.  ECG - SR rate 97 BPM. (See  cardiology reading for complete details)  HISTORY OF PRESENT ILLNESS (From Dr Lyn Records H&P) Megan Lane is a 64 y.o. female with a past medical history significant for hypertension, hyperlipidemia, diabetes, childhood asthma (not requiring medications at this time). She reports that she was in her usual state of health this morning, working at [employer] with a neutral head position when she suddenly had onset of dizziness and felt like the world was spinning around her.  This was associated with some mild nausea, and intermittent headache, and she has had some mild chest pain that is worse with a deep breath (which is not a new problem for her).  There has been a waxing and waning quality to her symptoms. On my evaluation, her neurological examination was overall reassuring but then I noted she had a head tilt to the left with mild chin tuck.  When I help to put her head into a neutral position she reported binocular double vision that was vertical and slightly skewed.  This improved again with head tilt.  Discussed with the patient and family that she may have a microvascular ischemic insult to her trochlear nerve versus a brainstem stroke (presented in layman's terms).  Discussed risks and benefits of tPA and patient elected to proceed with this after confirming she met all criteria for tPA administration. On review of systems the patient denied frequent headaches, prior double vision, hearing loss, speech or swallow difficulties, focal numbness or tingling (other than some mild right lateral hip burning pain that is chronic), fevers, chills, cough, burning when she pees, blood in her stool or urine, bloody emesis, any recent medical procedures LKW: 9:30 AM tPA given?:  Yes, started at 11:37 AM  Premorbid modified rankin scale:      0 - No symptoms.  HOSPITAL COURSE [name] is a 64 y.o. female with history of hypertension, hyperlipidemia, diabetes, childhood  asthma (not requiring medications at  this time) who presented with  sudden onset of dizziness associated with waxing and waning mild nausea, intermittent headache, and mild chest pain that was worse with a deep breath (which is not a new problem for her). IN ED pt was noted to have head tilt to the left with mild chin tuck with binocular double vision that was vertical. The patient received IV t-PA Thursday 01/22/20 at 1145.  TIA vs. CN IV diabetic mononeuropathy (usually not recover this fast) Suspect stroke treated with tPA  Resultant resolution of deficits.  CT Head -  Negative CT head. ASPECTS is 10   MRI head - Negative for acute infarct. Mild chronic microvascular ischemic change in the white matter. Negative for hemorrhage.  MRA head - negative  MRA neck - negative  2D Echo - EF 55 - 60%. No cardiac source of emboli identified.   Sars Corona Virus 2 - negative  LDL - 70  HgbA1c - 8.8  UDS - negative  VTE prophylaxis - SCD  Diet - carb modified  No antithrombotic prior to admission, now on ASA 81 and plavix for 3 weeks and then ASA alone.  Patient counseled to be compliant with her antithrombotic medications  Ongoing aggressive stroke risk factor management  Therapy recommendations:  Outpt PT  Disposition:  discharge to home  Hypertension  Home BP meds: HCTZ  Stable  Long-term BP goal normotensive  Hyperlipidemia  Home Lipid lowering medication: Lipitor 20 mg daily   LDL 70, goal < 70  Current lipid lowering medication: Lipitor 20 mg daily   Continue statin at discharge  Diabetes  Home diabetic meds: Glucotrol ; metformin ; Wilder Glade  Current diabetic meds: SSI   HgbA1c 8.8, goal < 7.0  CBGs  PCP close follow up for better DM control  Other Stroke Risk Factors  Advanced age  Former cigarette smoker - quit  Obesity, Body mass index is 33.94 kg/m., recommend weight loss, diet and exercise as appropriate   Family hx stroke (mother)    Other Active Problems, Findings and Recommendations  Code status - Full Code   AKI Cre  2.12->2.00->1.15   DISCHARGE EXAM Vitals:   01/23/20 1204 01/23/20 1302 01/23/20 1400 01/23/20 1445  BP: 119/85 114/74 129/81   Pulse: 79 83 74   Resp: '19 17 17   ' Temp: 98.4 F (36.9 C) 98.5 F (36.9 C)  98.6 F (37 C)  TempSrc:    Oral  SpO2: 100% 100% 100%   Weight:      Height:        Physical Exam  Constitutional: Appears well-developed and well-nourished.  Psych: Affect appropriate to situation Eyes: No scleral injection HENT: No OP obstrucion MSK: no joint deformities.  Cardiovascular: Normal rate and regular rhythm.  Respiratory: Effort normal, non-labored breathing GI: Soft.  No distension. There is no tenderness.  Skin: WDI  Neuro: Mental Status: Patient is awake, alert, oriented to person, place, month, year, and situation. Patient is able to give a clear and coherent history. No signs of aphasia or neglect Cranial Nerves: II: Visual Fields are full. Pupils are equal, round, and reactive to light.   III,IV, VI: EOMI without ptosis. No diplopia in all directions V: Facial sensation is symmetric to temperature VII: Facial movement is symmetric.  VIII: hearing is intact to finger rub  X: Uvula midline  XI: Shoulder shrug is symmetric. XII: tongue is midline without atrophy or fasciculations.  Motor: Tone is normal. Bulk is normal. 5/5 strength was present in  all four extremities.  Sensory: Sensation is symmetric to light touch and temperature in the arms and legs. Deep Tendon Reflexes: 2+ and symmetric in the biceps and patellae.  Plantars: Toes are downgoing bilaterally.  Cerebellar: FNF and HKS are intact bilaterally Gait:  Wide based and cautious casual gait; unsteady    DISCHARGE DIET   Diet Order            Diet Carb Modified Fluid consistency: Thin; Room service appropriate? Yes  Diet effective now                liquids  DISCHARGE  PLAN  Disposition: Discharge to home  Aspirin 81 mg and Plavix 75 mg for 3 weeks then aspirin 81 mg daily without Plavix.  Ongoing risk factor control by Primary Care Physician at time of discharge  Follow-up Megan Rakes, MD in 2 weeks.  Follow-up in [facility] in 4 weeks, office to schedule an appointment.   35 minutes were spent preparing discharge.  Rosalin Hawking, MD PhD Stroke Neurology 01/24/2020 7:23 AM

## 2020-01-23 NOTE — Discharge Planning (Signed)
RNCM placed referral to Outpatient PT.

## 2020-01-23 NOTE — ED Notes (Signed)
Pt in MRI.

## 2020-01-23 NOTE — ED Notes (Signed)
Breakfast ordered 

## 2020-01-29 ENCOUNTER — Telehealth: Payer: Self-pay | Admitting: Physical Therapy

## 2020-01-29 NOTE — Telephone Encounter (Signed)
Attempted to contact patient regarding PT referral following her recent ED visit. Left VM informing patient that the referral was for her dizziness (trochlear nerve disease) and she could be seen at the OP Neuro clinic for that diagnosis, but the ED PT notes stated she would benefit from an OPPT referral for her chronic sciatic nerve pain. Informed patient if she would like PT for sciatic nerve pain, she would need a new PT referral from her PCP, who she is scheduled to see on 02/18/20. Patient instructed to contact PT office at [phone] with any questions.  Hilda Blades, PT, DPT, LAT, ATC 01/29/20  3:14 PM Phone: [phone] Fax: [phone]

## 2020-02-18 ENCOUNTER — Ambulatory Visit: Payer: BC Managed Care – PPO | Admitting: Family Medicine

## 2020-02-23 ENCOUNTER — Ambulatory Visit: Payer: BC Managed Care – PPO | Attending: Family Medicine | Admitting: Family Medicine

## 2020-02-23 ENCOUNTER — Other Ambulatory Visit: Payer: Self-pay

## 2020-02-24 ENCOUNTER — Telehealth: Payer: Self-pay | Admitting: Family Medicine

## 2020-02-24 NOTE — Telephone Encounter (Signed)
Pt was called today to be added to the schedule and phone continues to go to VM a message was left.

## 2020-02-24 NOTE — Telephone Encounter (Signed)
Copied from [place] [id number]. Topic: General - Other >> Feb 23, 2020  1:37 PM Leward Quan A wrote: Reason for CRM: Patient called stated she was in the restroom and missed the call for her visit please call her at Ph# [phone] >> Feb 24, 2020 10:34 AM Greggory Keen D wrote: Pt called saying she missed her call yesterday but she really needs to talk to a nurse or Dr. Margarita Rana.  CB#   [phone] >> Feb 23, 2020  1:58 PM Leota Sauers wrote: Notified nurse and nurse stated she would call patient back.    Patient called back in, requesting to speak to nurse or provider about yesterdays missed visit. Please follow up.

## 2020-03-16 ENCOUNTER — Ambulatory Visit: Payer: BC Managed Care – PPO | Admitting: Podiatry

## 2020-03-17 ENCOUNTER — Ambulatory Visit (INDEPENDENT_AMBULATORY_CARE_PROVIDER_SITE_OTHER): Payer: BC Managed Care – PPO | Admitting: Podiatry

## 2020-03-17 ENCOUNTER — Encounter: Payer: Self-pay | Admitting: Podiatry

## 2020-03-17 ENCOUNTER — Other Ambulatory Visit: Payer: Self-pay

## 2020-03-17 DIAGNOSIS — B351 Tinea unguium: Secondary | ICD-10-CM

## 2020-03-17 DIAGNOSIS — M79675 Pain in left toe(s): Secondary | ICD-10-CM | POA: Diagnosis not present

## 2020-03-17 DIAGNOSIS — E1149 Type 2 diabetes mellitus with other diabetic neurological complication: Secondary | ICD-10-CM

## 2020-03-17 DIAGNOSIS — L84 Corns and callosities: Secondary | ICD-10-CM | POA: Diagnosis not present

## 2020-03-17 DIAGNOSIS — M79674 Pain in right toe(s): Secondary | ICD-10-CM

## 2020-03-21 NOTE — Progress Notes (Signed)
Subjective: Megan Lane presents with diabetes, diabetic neuropathy for routine foot care with painful mycotic toenails and chronic callosities which interfere with activities of daily living. Pain is aggravated when wearing enclosed shoe gear. Pain is relieved with periodic professional debridement.  [name], MD is her PCP.  Last visit was 11/18/2019.  Patient states her blood glucose was between 117-120 mg/dl daily.   Sadly, she states she lost her sister recently.    Current Outpatient Medications:  .  aspirin EC 81 MG tablet, Take 1 tablet (81 mg total) by mouth daily. Swallow whole., Disp: 30 tablet, Rfl: 11 .  atorvastatin (LIPITOR) 20 MG tablet, Take 1 tablet (20 mg total) by mouth daily., Disp: 30 tablet, Rfl: 6 .  clopidogrel (PLAVIX) 75 MG tablet, Take 1 tablet (75 mg total) by mouth daily., Disp: 21 tablet, Rfl: 0 .  dapagliflozin propanediol (FARXIGA) 10 MG TABS tablet, Take 1 tablet (10 mg total) by mouth daily before breakfast. (Patient not taking: Reported on 01/22/2020), Disp: 30 tablet, Rfl: 6 .  gabapentin (NEURONTIN) 300 MG capsule, Take 2 capsules (600 mg total) by mouth 3 (three) times daily., Disp: 180 capsule, Rfl: 6 .  glipiZIDE (GLUCOTROL) 10 MG tablet, Take 1 tablet (10 mg total) by mouth 2 (two) times daily before a meal., Disp: 60 tablet, Rfl: 6 .  glucose blood test strip, Use as instructed, Disp: 100 each, Rfl: 12 .  hydrochlorothiazide (HYDRODIURIL) 25 MG tablet, Take 1 tablet (25 mg total) by mouth daily., Disp: 30 tablet, Rfl: 6 .  HYDROcodone-acetaminophen (NORCO/VICODIN) 5-325 MG tablet, Take 1 tablet by mouth every 6 (six) hours as needed for moderate pain. (Patient not taking: No sig reported), Disp: 15 tablet, Rfl: 0 .  ibuprofen (ADVIL) 600 MG tablet, Take 1 tablet (600 mg total) by mouth 3 (three) times daily. With food X 7 days then prn pain (Patient taking differently: Take 600 mg by mouth every 8 (eight) hours as needed for moderate pain. With  food X 7 days then prn pain), Disp: 60 tablet, Rfl: 2 .  Lancets (ACCU-CHEK SOFT TOUCH) lancets, Use as instructed, Disp: 100 each, Rfl: 12 .  metFORMIN (GLUCOPHAGE) 1000 MG tablet, Take 1 tablet (1,000 mg total) by mouth 2 (two) times daily with a meal., Disp: 60 tablet, Rfl: 6 .  methocarbamol (ROBAXIN) 500 MG tablet, Take 2 tablets (1,000 mg total) by mouth 3 (three) times daily. X 7 days then prn muscle spasm (Patient taking differently: Take 1,000 mg by mouth at bedtime.), Disp: 90 tablet, Rfl: 0  Allergies  Allergen Reactions  . Penicillins Rash    Did it involve swelling of the face/tongue/throat, SOB, or low BP? {Y/N/UNK:3040802 Did it involve sudden or severe rash/hives, skin peeling, or any reaction on the inside of your mouth or nose? Y Did you need to seek medical attention at a hospital or doctor's office? Y When did it last happen?Several years ago If all above answers are "NO", may proceed with cephalosporin use.     Objective: There were no vitals filed for this visit.  Vascular Examination: Capillary refill time immediate x10 digits.  Dorsalis pedis and posterior tibial pulses are palpable bilaterally.  Digital hair is present x4 digits.  Skin temperature gradient within normal limits bilaterally.  Dermatological Examination: Skin with normal turgor, texture and tone b/l.  Toenails 1-5 b/l discolored, thick, dystrophic with subungual debris and pain with palpation to nailbeds due to thickness of nails.  Hyperkeratotic lesion(s) submetatarsal heads 1, 4  right foot and submetatarsal heads 1, 2, 3, 4 left foot.  There is also a small hyperkeratotic lesion on the lateral aspect of the right foot. No erythema, no edema, no drainage, no flocculence noted.   Diffuse scaling noted peripherally and plantarly b/l feet with mild foot odor.  No interdigital macerations.  No blisters, no weeping. No signs of secondary bacterial infection noted.  Musculoskeletal: Muscle  strength 5/5 to all LE muscle groups.  No pain, crepitus or joint limitation with passive/active ROM.  Neurological: Sensation diminished with 10 gram monofilament.  Vibratory sensation diminished  Assessment: 1. Painful onychomycosis toenails 1-5 b/l 2. Calluses submetatarsal heads 1, 4 right foot and submetatarsal heads 1, 2, 3, 4 left foot 3. Tinea pedis bilaterally. 4. NIDDM with Diabetic neuropathy  Plan: 1. Continue diabetic foot care principles. 2. Toenails 1-5 b/l were debrided in length and girth without iatrogenic bleeding. 3. Calluses pared submetatarsal heads 1, 4 right foot and submetatarsal heads 1, 2, 3, 4 left foot utilizing sterile scalpel blade without incident.  4. Patient to continue soft, supportive shoe gear daily. 5. Patient to report any pedal injuries to medical professional immediately.  6. Follow up 3 months.  7. Patient/POA to call should there be a concern in the interim.

## 2020-04-02 ENCOUNTER — Other Ambulatory Visit: Payer: Self-pay | Admitting: Family Medicine

## 2020-04-02 DIAGNOSIS — M5441 Lumbago with sciatica, right side: Secondary | ICD-10-CM

## 2020-04-02 MED ORDER — IBUPROFEN 600 MG PO TABS: 600.0000 mg | ORAL_TABLET | Freq: Three times a day (TID) | ORAL | 2 refills | Status: DC

## 2020-04-02 NOTE — Telephone Encounter (Signed)
Medication:  ibuprofen (ADVIL) 600 MG tablet  Has the pt contacted their pharmacy? No she lost the bottle Preferred pharmacy:  [facility] ([place]), [place] - [address] Please be advised refills may take up to 3 business days.  We ask that you follow up with your pharmacy.

## 2020-04-02 NOTE — Telephone Encounter (Signed)
Requested Prescriptions  Pending Prescriptions Disp Refills  . ibuprofen (ADVIL) 600 MG tablet 60 tablet 2    Sig: Take 1 tablet (600 mg total) by mouth 3 (three) times daily. With food X 7 days then prn pain     Analgesics:  NSAIDS Failed - 04/02/2020  3:18 PM      Failed - Cr in normal range and within 360 days    Creat  Date Value Ref Range Status  02/09/2015 1.00 0.50 - 1.05 mg/dL Final   Creatinine, Ser  Date Value Ref Range Status  01/23/2020 1.15 (H) 0.44 - 1.00 mg/dL Final   Creatinine, POC  Date Value Ref Range Status  12/04/2016 30 mg/dL Final         Failed - HGB in normal range and within 360 days    Hemoglobin  Date Value Ref Range Status  01/23/2020 11.9 (L) 12.0 - 15.0 g/dL Final         Passed - Patient is not pregnant      Passed - Valid encounter within last 12 months    Recent Outpatient Visits          4 months ago Type 2 diabetes mellitus with diabetic neuropathy, without long-term current use of insulin ([place])   [place], [place], MD   7 months ago Type 2 diabetes mellitus with diabetic neuropathy, without long-term current use of insulin ([place])   [place], Charlane Ferretti, MD   1 year ago Acute bilateral low back pain with right-sided sciatica   [place], [place], [place]   1 year ago Umbilical hernia without obstruction and without gangrene   [place], Charlane Ferretti, MD   1 year ago Muscle spasm   [place], [place], MD      Future Appointments            In 1 week Leonie Man, Lucy [place], MD [facility]   In 3 weeks [name], MD [place]

## 2020-04-05 ENCOUNTER — Other Ambulatory Visit: Payer: Self-pay | Admitting: Family Medicine

## 2020-04-05 DIAGNOSIS — E782 Mixed hyperlipidemia: Secondary | ICD-10-CM

## 2020-04-08 ENCOUNTER — Other Ambulatory Visit: Payer: Self-pay | Admitting: Family Medicine

## 2020-04-08 DIAGNOSIS — E782 Mixed hyperlipidemia: Secondary | ICD-10-CM

## 2020-04-08 NOTE — Telephone Encounter (Signed)
Medication Refill - Medication: Atorvastatin   Has the patient contacted their pharmacy? Yes.   Pt states that she contacted the pharmacy and they state they do not have this medication on file for pt. Please advise. (Agent: If no, request that the patient contact the pharmacy for the refill.) (Agent: If yes, when and what did the pharmacy advise?)  Preferred Pharmacy (with phone number or street name):  [facility] ([place]), [place] - [address]  [address] [place] ([place]) [place] [address]  Phone: [phone] Fax: [phone]  Hours: Not open 24 hours     Agent: Please be advised that RX refills may take up to 3 business days. We ask that you follow-up with your pharmacy.

## 2020-04-08 NOTE — Telephone Encounter (Signed)
Call to pharmacy- they do have the Rx on file- not sure why it was not filled- they will prepare it now. Call to patient- left message regarding Rx- pharmacy is filling for pick up- pease keep appointment with PCP for further RF.

## 2020-04-12 ENCOUNTER — Other Ambulatory Visit: Payer: Self-pay | Admitting: Family Medicine

## 2020-04-12 DIAGNOSIS — E1140 Type 2 diabetes mellitus with diabetic neuropathy, unspecified: Secondary | ICD-10-CM

## 2020-04-13 ENCOUNTER — Ambulatory Visit (INDEPENDENT_AMBULATORY_CARE_PROVIDER_SITE_OTHER): Payer: BC Managed Care – PPO | Admitting: Neurology

## 2020-04-13 ENCOUNTER — Encounter: Payer: Self-pay | Admitting: Neurology

## 2020-04-13 ENCOUNTER — Other Ambulatory Visit: Payer: Self-pay

## 2020-04-13 VITALS — BP 144/85 | HR 77 | Ht 72.0 in | Wt 232.8 lb

## 2020-04-13 DIAGNOSIS — G459 Transient cerebral ischemic attack, unspecified: Secondary | ICD-10-CM | POA: Diagnosis not present

## 2020-04-13 DIAGNOSIS — Z8673 Personal history of transient ischemic attack (TIA), and cerebral infarction without residual deficits: Secondary | ICD-10-CM

## 2020-04-13 DIAGNOSIS — R2990 Unspecified symptoms and signs involving the nervous system: Secondary | ICD-10-CM | POA: Diagnosis not present

## 2020-04-13 NOTE — Progress Notes (Signed)
[facility] [address] [place]. [place] [address] [phone]       OFFICE CONSULT NOTE  Megan Lane Date of Birth:  [birth year] Medical Record Number:  [id number]   Referring MD: Lynwood Dawley PA-C  Reason for Referral: Stroke HPI: Megan Lane is a 65 year old African-American lady seen today for initial office consultation visit for stroke.  History is obtained from the patient, review of electronic medical records and I personally reviewed imaging films in PACS.  She has past medical history of hyperlipidemia, diabetes, hypertension and obesity who presented on 01/22/2020 with sudden onset of dizziness which she describes as the world spinning along with mild nausea and some tingling of fingertips bilaterally.  The symptoms are intermittent and triggered by certain head movements.  She on exam by neuro hospitalist was found to have a significant head tilt to the left with mild chin tuck and due to severe disability from her double vision and vertigo was given IV TPA.  Symptoms gradually resolved after that and she was back to baseline in about 10 to 12 hours.  MRI scan of the brain was obtained which showed no acute infarct.  MRA of the brain and neck showed no large vessel extracranial intracranial stenosis.  2D echo showed ejection fraction of 55 to 60% without cardiac source of embolism.  LDL cholesterol was elevated at 70 mg percent and hemoglobin A1c was 8.8.  Urine drug screen was negative patient was started on dual antiplatelet therapy aspirin and Plavix for 3 weeks and after that she stopped the Plavix and is currently on aspirin alone.  She is doing well and she has had no recurrent TIA or stroke symptoms.  She is tolerating aspirin well without bruising or bleeding.  Her blood pressure is usually well controlled at home the today it is slightly elevated in office at 144/89.  She states she is eating healthy and walks 30 minutes every day and has lost 10  pounds.  She quit smoking in 2007 and does not drink alcohol.  There is no prior history of strokes or TIAs or significant neurological problems.  There is no family history of strokes.  ROS:   14 system review of systems is positive for dizziness, diplopia, nausea, tingling numbness, gait ataxia all other systems negative  PMH:  Past Medical History:  Diagnosis Date  . Asthma    as a child  . Diabetes mellitus   . Hypercholesterolemia   . Hypertension   . Stroke ([place])   . Ventral hernia   . Wears glasses     Social History:  Social History   Socioeconomic History  . Marital status: Single    Spouse name: Not on file  . Number of children: Not on file  . Years of education: Not on file  . Highest education level: Not on file  Occupational History  . Occupation: Part time (04/13/20)  Tobacco Use  . Smoking status: Former Smoker    Quit date: 08/11/2005    Years since quitting: 14.6  . Smokeless tobacco: Never Used  Vaping Use  . Vaping Use: Never used  Substance and Sexual Activity  . Alcohol use: No  . Drug use: No  . Sexual activity: Not Currently  Other Topics Concern  . Not on file  Social History Narrative   Lives with son   Right Handed   Drinks rarely caffeine   Social Determinants of Health   Financial Resource Strain: Not on file  Food Insecurity: Not on file  Transportation Needs: Not on file  Physical Activity: Not on file  Stress: Not on file  Social Connections: Not on file  Intimate Partner Violence: Not on file    Medications:   Current Outpatient Medications on File Prior to Visit  Medication Sig Dispense Refill  . aspirin EC 81 MG tablet Take 1 tablet (81 mg total) by mouth daily. Swallow whole. 30 tablet 11  . atorvastatin (LIPITOR) 20 MG tablet Take 1 tablet by mouth once daily 30 tablet 0  . FARXIGA 10 MG TABS tablet TAKE 1 TABLET BY MOUTH ONCE DAILY BEFORE  BREAKFAST 30 tablet 0  . gabapentin (NEURONTIN) 300 MG capsule TAKE 2 CAPSULES BY  MOUTH THREE TIMES DAILY 100 capsule 0  . glipiZIDE (GLUCOTROL) 10 MG tablet Take 1 tablet (10 mg total) by mouth 2 (two) times daily before a meal. 60 tablet 6  . glucose blood test strip Use as instructed 100 each 12  . hydrochlorothiazide (HYDRODIURIL) 25 MG tablet Take 1 tablet (25 mg total) by mouth daily. 30 tablet 6  . HYDROcodone-acetaminophen (NORCO/VICODIN) 5-325 MG tablet Take 1 tablet by mouth every 6 (six) hours as needed for moderate pain. 15 tablet 0  . ibuprofen (ADVIL) 600 MG tablet Take 1 tablet (600 mg total) by mouth 3 (three) times daily. With food X 7 days then prn pain 60 tablet 2  . Lancets (ACCU-CHEK SOFT TOUCH) lancets Use as instructed 100 each 12  . metFORMIN (GLUCOPHAGE) 1000 MG tablet Take 1 tablet (1,000 mg total) by mouth 2 (two) times daily with a meal. 60 tablet 6  . methocarbamol (ROBAXIN) 500 MG tablet Take 2 tablets (1,000 mg total) by mouth 3 (three) times daily. X 7 days then prn muscle spasm (Patient taking differently: Take 1,000 mg by mouth at bedtime.) 90 tablet 0   No current facility-administered medications on file prior to visit.    Allergies:   Allergies  Allergen Reactions  . Penicillins Rash    Did it involve swelling of the face/tongue/throat, SOB, or low BP? {[id number] Did it involve sudden or severe rash/hives, skin peeling, or any reaction on the inside of your mouth or nose? Y Did you need to seek medical attention at a hospital or doctor's office? Y When did it last happen?Several years ago If all above answers are "NO", may proceed with cephalosporin use.     Physical Exam General: Obese middle-aged African-American lady, seated, in no evident distress Head: head normocephalic and atraumatic.   Neck: supple with no carotid or supraclavicular bruits Cardiovascular: regular rate and rhythm, no murmurs Musculoskeletal: no deformity.  Wearing right wrist brace. Skin:  no rash/petichiae Vascular:  Normal pulses all  extremities  Neurologic Exam Mental Status: Awake and fully alert. Oriented to place and time. Recent and remote memory intact. Attention span, concentration and fund of knowledge appropriate. Mood and affect appropriate.  Cranial Nerves: Fundoscopic exam reveals sharp disc margins. Pupils equal, briskly reactive to light. Extraocular movements full without nystagmus. Visual fields full to confrontation. Hearing intact. Facial sensation intact. Face, tongue, palate moves normally and symmetrically.  Motor: Normal bulk and tone. Normal strength in all tested extremity muscles. Sensory.: intact to touch , pinprick , position and vibratory sensation.  Coordination: Rapid alternating movements normal in all extremities. Finger-to-nose and heel-to-shin performed accurately bilaterally. Gait and Station: Arises from chair without difficulty. Stance is normal. Gait demonstrates normal stride length and balance . Able to heel, toe and tandem  walk with moderate difficulty.  Reflexes: 1+ and symmetric. Toes downgoing.   NIHSS  0 Modified Rankin  0   ASSESSMENT: 65 year old African-American lady with transient episode of vertigo, diplopia and hand paresthesias in December 2021 treated with IV TPA likely small MRI negative brainstem infarct who is doing well.  Vascular risk factors of diabetes, hypertension, hyperlipidemia and obesity     PLAN: I had a long d/w patient about her recent MRI negative small brainstem stroke, risk for recurrent stroke/TIAs, personally independently reviewed imaging studies and stroke evaluation results and answered questions.Continue aspirin 81 mg daily  for secondary stroke prevention and maintain strict control of hypertension with blood pressure goal below 130/90, diabetes with hemoglobin A1c goal below 6.5% and lipids with LDL cholesterol goal below 70 mg/dL. I also advised the patient to eat a healthy diet with plenty of whole grains, cereals, fruits and vegetables,  exercise regularly and maintain ideal body weight Followup in the future with my nurse practitioner Janett Billow in 6 months or call earlier if necessary.  Greater than 50% time during this 45-minute visit was spent on counseling and coordination of care about her TIA and stroke and discussion about prevention and treatment and answering questions. Antony Contras, MD Note: This document was prepared with digital dictation and possible smart phrase technology. Any transcriptional errors that result from this process are unintentional.

## 2020-04-13 NOTE — Patient Instructions (Signed)
I had a long d/w patient about her recent MRI negative small brainstem stroke, risk for recurrent stroke/TIAs, personally independently reviewed imaging studies and stroke evaluation results and answered questions.Continue aspirin 81 mg daily  for secondary stroke prevention and maintain strict control of hypertension with blood pressure goal below 130/90, diabetes with hemoglobin A1c goal below 6.5% and lipids with LDL cholesterol goal below 70 mg/dL. I also advised the patient to eat a healthy diet with plenty of whole grains, cereals, fruits and vegetables, exercise regularly and maintain ideal body weight Followup in the future with my nurse practitioner Janett Billow in 6 months or call earlier if necessary.  Stroke Prevention Some medical conditions and behaviors are associated with a higher chance of having a stroke. You can help prevent a stroke by making nutrition, lifestyle, and other changes, including managing any medical conditions you may have. What nutrition changes can be made?  Eat healthy foods. You can do this by: ? Choosing foods high in fiber, such as fresh fruits and vegetables and whole grains. ? Eating at least 5 or more servings of fruits and vegetables a day. Try to fill half of your plate at each meal with fruits and vegetables. ? Choosing lean protein foods, such as lean cuts of meat, poultry without skin, fish, tofu, beans, and nuts. ? Eating low-fat dairy products. ? Avoiding foods that are high in salt (sodium). This can help lower blood pressure. ? Avoiding foods that have saturated fat, trans fat, and cholesterol. This can help prevent high cholesterol. ? Avoiding processed and premade foods.  Follow your health care provider's specific guidelines for losing weight, controlling high blood pressure (hypertension), lowering high cholesterol, and managing diabetes. These may include: ? Reducing your daily calorie intake. ? Limiting your daily sodium intake to 1,500 milligrams  (mg). ? Using only healthy fats for cooking, such as olive oil, canola oil, or sunflower oil. ? Counting your daily carbohydrate intake.   What lifestyle changes can be made?  Maintain a healthy weight. Talk to your health care provider about your ideal weight.  Get at least 30 minutes of moderate physical activity at least 5 days a week. Moderate activity includes brisk walking, biking, and swimming.  Do not use any products that contain nicotine or tobacco, such as cigarettes and e-cigarettes. If you need help quitting, ask your health care provider. It may also be helpful to avoid exposure to secondhand smoke.  Limit alcohol intake to no more than 1 drink a day for nonpregnant women and 2 drinks a day for men. One drink equals 12 oz of beer, 5 oz of wine, or 1 oz of hard liquor.  Stop any illegal drug use.  Avoid taking birth control pills. Talk to your health care provider about the risks of taking birth control pills if: ? You are over 78 years old. ? You smoke. ? You get migraines. ? You have ever had a blood clot. What other changes can be made?  Manage your cholesterol levels. ? Eating a healthy diet is important for preventing high cholesterol. If cholesterol cannot be managed through diet alone, you may also need to take medicines. ? Take any prescribed medicines to control your cholesterol as told by your health care provider.  Manage your diabetes. ? Eating a healthy diet and exercising regularly are important parts of managing your blood sugar. If your blood sugar cannot be managed through diet and exercise, you may need to take medicines. ? Take any prescribed medicines  to control your diabetes as told by your health care provider.  Control your hypertension. ? To reduce your risk of stroke, try to keep your blood pressure below 130/80. ? Eating a healthy diet and exercising regularly are an important part of controlling your blood pressure. If your blood pressure  cannot be managed through diet and exercise, you may need to take medicines. ? Take any prescribed medicines to control hypertension as told by your health care provider. ? Ask your health care provider if you should monitor your blood pressure at home. ? Have your blood pressure checked every year, even if your blood pressure is normal. Blood pressure increases with age and some medical conditions.  Get evaluated for sleep disorders (sleep apnea). Talk to your health care provider about getting a sleep evaluation if you snore a lot or have excessive sleepiness.  Take over-the-counter and prescription medicines only as told by your health care provider. Aspirin or blood thinners (antiplatelets or anticoagulants) may be recommended to reduce your risk of forming blood clots that can lead to stroke.  Make sure that any other medical conditions you have, such as atrial fibrillation or atherosclerosis, are managed. What are the warning signs of a stroke? The warning signs of a stroke can be easily remembered as BEFAST.  B is for balance. Signs include: ? Dizziness. ? Loss of balance or coordination. ? Sudden trouble walking.  E is for eyes. Signs include: ? A sudden change in vision. ? Trouble seeing.  F is for face. Signs include: ? Sudden weakness or numbness of the face. ? The face or eyelid drooping to one side.  A is for arms. Signs include: ? Sudden weakness or numbness of the arm, usually on one side of the body.  S is for speech. Signs include: ? Trouble speaking (aphasia). ? Trouble understanding.  T is for time. ? These symptoms may represent a serious problem that is an emergency. Do not wait to see if the symptoms will go away. Get medical help right away. Call your local emergency services (911 in the [place] Do not drive yourself to the hospital.  Other signs of stroke may include: ? A sudden, severe headache with no known cause. ? Nausea or vomiting. ? Seizure. Where  to find more information For more information, visit:  American Stroke Association: www.strokeassociation.org  National Stroke Association: www.stroke.org Summary  You can prevent a stroke by eating healthy, exercising, not smoking, limiting alcohol intake, and managing any medical conditions you may have.  Do not use any products that contain nicotine or tobacco, such as cigarettes and e-cigarettes. If you need help quitting, ask your health care provider. It may also be helpful to avoid exposure to secondhand smoke.  Remember BEFAST for warning signs of stroke. Get help right away if you or a loved one has any of these signs. This information is not intended to replace advice given to you by your health care provider. Make sure you discuss any questions you have with your health care provider. Document Revised: 01/05/2017 Document Reviewed: 02/29/2016 Elsevier Patient Education  2021 [employer].

## 2020-04-20 ENCOUNTER — Ambulatory Visit: Payer: Self-pay | Admitting: *Deleted

## 2020-04-20 NOTE — Telephone Encounter (Signed)
C/o vaginal itching and pain with urination. Symptoms started last Friday. Patient used monistat OTC for itching and reported some relief. Painful urinating continues. Denies fever, bleeding , incontinence or discharge or odor. Earliest appt 04/28/20. Patient requesting any medication she can take prior to appt. Care advise given. Patient verbalized understanding of care advise and to call back or go to [facility] or ED if symptoms worsen. Please advise .  Reason for Disposition . [1] Vaginal itching AND [2] not improved > 3 days following CARE ADVICE  Answer Assessment - Initial Assessment Questions 1. SYMPTOM: "What's the main symptom you're concerned about?" (e.g., pain, itching, dryness)     Itching and burning during urinating 2. LOCATION: "Where is the  pain located?" (e.g., inside/outside, left/right)     Outside at urethra  3. ONSET: "When did the  Itching and burning during urination  start?"     Last Friday  4. PAIN: "Is there any pain?" If Yes, ask: "How bad is it?" (Scale: 1-10; mild, moderate, severe)     Yes bad burning during urination 5. ITCHING: "Is there any itching?" If Yes, ask: "How bad is it?" (Scale: 1-10; mild, moderate, severe)     Yes was severe itching and patient used monistat with some relief. 6. CAUSE: "What do you think is causing the discharge?" "Have you had the same problem before? What happened then?"     na 7. OTHER SYMPTOMS: "Do you have any other symptoms?" (e.g., fever, itching, vaginal bleeding, pain with urination, injury to genital area, vaginal foreign body)     Vaginal itching and pain with urination 8. PREGNANCY: "Is there any chance you are pregnant?" "When was your last menstrual period?"     na  Protocols used: VAGINAL [facility]

## 2020-04-21 ENCOUNTER — Other Ambulatory Visit: Payer: Self-pay

## 2020-04-21 NOTE — Patient Outreach (Signed)
[facility]) Care Management  04/21/2020  MARLIN BRYS [birth year] [id number]  Dr. Leonie Man obtained mRS score on 04/13/20 office visit. MRS= 0  Thank you, [place] Care Management Assistant

## 2020-04-21 NOTE — Telephone Encounter (Signed)
Pt having vaginal itching and painful urination.  Can pt come in for nurse visit for vaginal swab and UA?

## 2020-04-21 NOTE — Telephone Encounter (Signed)
Yes she can.

## 2020-04-21 NOTE — Telephone Encounter (Signed)
Pt was called and a VM was left informing pt to set a nurse visit appointment for symptoms.

## 2020-04-21 NOTE — Telephone Encounter (Signed)
Will forward to nurse 

## 2020-04-23 ENCOUNTER — Telehealth: Payer: Self-pay

## 2020-04-23 ENCOUNTER — Other Ambulatory Visit: Payer: Self-pay

## 2020-04-23 ENCOUNTER — Ambulatory Visit: Payer: BC Managed Care – PPO | Attending: Family Medicine

## 2020-04-23 ENCOUNTER — Other Ambulatory Visit (HOSPITAL_COMMUNITY)
Admission: RE | Admit: 2020-04-23 | Discharge: 2020-04-23 | Disposition: A | Discharge status: Home / Self Care | Payer: BC Managed Care – PPO | Source: Ambulatory Visit | Attending: Family Medicine | Admitting: Family Medicine

## 2020-04-23 DIAGNOSIS — N760 Acute vaginitis: Secondary | ICD-10-CM | POA: Diagnosis not present

## 2020-04-23 DIAGNOSIS — Z113 Encounter for screening for infections with a predominantly sexual mode of transmission: Secondary | ICD-10-CM | POA: Insufficient documentation

## 2020-04-23 DIAGNOSIS — N898 Other specified noninflammatory disorders of vagina: Secondary | ICD-10-CM | POA: Insufficient documentation

## 2020-04-23 DIAGNOSIS — B9689 Other specified bacterial agents as the cause of diseases classified elsewhere: Secondary | ICD-10-CM | POA: Insufficient documentation

## 2020-04-23 DIAGNOSIS — B373 Candidiasis of vulva and vagina: Secondary | ICD-10-CM | POA: Diagnosis not present

## 2020-04-23 LAB — POCT URINALYSIS DIP (CLINITEK)
Bilirubin, UA: NEGATIVE
Blood, UA: NEGATIVE
Glucose, UA: >=1000 mg/dL — AB
Ketones, POC UA: NEGATIVE mg/dL
Leukocytes, UA: NEGATIVE
Nitrite, UA: NEGATIVE
POC PROTEIN,UA: NEGATIVE
Spec Grav, UA: 1.025 (ref 1.010–1.025)
Urobilinogen, UA: 0.2 E.U./dL
pH, UA: 5.5 (ref 5.0–8.0)

## 2020-04-23 NOTE — Progress Notes (Signed)
Pt arrived at clinic to give urine sample and self swab.  Sample was collected and PCP has been notified of UA results.  Pt was informed that she will be contacted with results and if medication is needed.

## 2020-04-23 NOTE — Telephone Encounter (Signed)
Pt came in to drop off urine sample.  UA is in chart and cervical swab is pending.

## 2020-04-23 NOTE — Telephone Encounter (Signed)
Urinalysis is negative for UTI.

## 2020-04-26 ENCOUNTER — Encounter: Payer: Self-pay | Admitting: *Deleted

## 2020-04-26 LAB — CERVICOVAGINAL ANCILLARY ONLY
Bacterial Vaginitis (gardnerella): POSITIVE — AB
Candida Glabrata: POSITIVE — AB
Candida Vaginitis: NEGATIVE
Chlamydia: NEGATIVE
Comment: NEGATIVE
Comment: NEGATIVE
Comment: NEGATIVE
Comment: NEGATIVE
Comment: NEGATIVE
Comment: NORMAL
Neisseria Gonorrhea: NEGATIVE
Trichomonas: NEGATIVE

## 2020-04-26 NOTE — Progress Notes (Signed)
[facility] - 04/26/20 1000      Assessment    Assessment type Phone to patient    Is patient still in hospital? No    Date of hospital discharge after thrombolysis? 01/23/20      Final 90-Day Modified Rankin Score   Final 90-Day Modified Rankin Score: ([facility]) 0-No symptoms from stroke remain, but able to carry out all usual activities      [id number]   Mobility 1- no problems in walking about    Self-care 1- no problems with Self-care    Usual activities 1- no problems with performing usual activities (e.g. work, study, housework, family or leisure activities)    Pain/discomfort 1- no pain or discomfort    Anxiety/Depression 1- not anxious or depressed    What number between 0-100 best describes the patient's health state today (100 means the best health; 0 means the worst health)? [facility] Admission   In the Past 3 months (since your initial hospitalisation for stroke), have you been admitted to hospital (including day-only procedures) for any reason? No      Doctor consultations   In the past 3 months (since your initial hospitalisation for stroke), have you seen any doctors or other health professional (for example physiotherapy, outpatient nurse, general practitioner) for any reason? Yes      a. Doctor consultations   a. Type of service Podiatry    a. Condition or purpose Nail problem    a. Date of appointment 03/10/20      b. Doctor consultations   b. Type of service Neurology    b. Condition or purpose Brainstem infarct    b. Date of appointment 04/13/20

## 2020-04-27 ENCOUNTER — Other Ambulatory Visit: Payer: Self-pay | Admitting: Family Medicine

## 2020-04-27 MED ORDER — METRONIDAZOLE 500 MG PO TABS: 500.0000 mg | ORAL_TABLET | Freq: Two times a day (BID) | ORAL | 0 refills | Status: DC

## 2020-04-27 MED ORDER — FLUCONAZOLE 150 MG PO TABS: 150.0000 mg | ORAL_TABLET | Freq: Once | ORAL | 0 refills | Status: AC

## 2020-04-28 ENCOUNTER — Ambulatory Visit: Payer: BC Managed Care – PPO | Attending: Family Medicine | Admitting: Family Medicine

## 2020-04-28 ENCOUNTER — Other Ambulatory Visit: Payer: Self-pay

## 2020-04-28 ENCOUNTER — Encounter: Payer: Self-pay | Admitting: Family Medicine

## 2020-04-28 ENCOUNTER — Telehealth: Payer: Self-pay

## 2020-04-28 VITALS — BP 137/74 | HR 75 | Resp 18 | Ht 72.0 in | Wt 236.2 lb

## 2020-04-28 DIAGNOSIS — E785 Hyperlipidemia, unspecified: Secondary | ICD-10-CM

## 2020-04-28 DIAGNOSIS — M5431 Sciatica, right side: Secondary | ICD-10-CM

## 2020-04-28 DIAGNOSIS — I10 Essential (primary) hypertension: Secondary | ICD-10-CM

## 2020-04-28 DIAGNOSIS — E1169 Type 2 diabetes mellitus with other specified complication: Secondary | ICD-10-CM

## 2020-04-28 DIAGNOSIS — E1140 Type 2 diabetes mellitus with diabetic neuropathy, unspecified: Secondary | ICD-10-CM

## 2020-04-28 LAB — POCT GLYCOSYLATED HEMOGLOBIN (HGB A1C): HbA1c, POC (controlled diabetic range): 7.6 % — AB (ref 0.0–7.0)

## 2020-04-28 LAB — GLUCOSE, POCT (MANUAL RESULT ENTRY): POC Glucose: 107 mg/dl — AB (ref 70–99)

## 2020-04-28 MED ORDER — METFORMIN HCL 1000 MG PO TABS: 1000.0000 mg | ORAL_TABLET | Freq: Two times a day (BID) | ORAL | 6 refills | Status: DC

## 2020-04-28 MED ORDER — HYDROCHLOROTHIAZIDE 25 MG PO TABS: 25.0000 mg | ORAL_TABLET | Freq: Every day | ORAL | 6 refills | Status: DC

## 2020-04-28 MED ORDER — DAPAGLIFLOZIN PROPANEDIOL 10 MG PO TABS: 10.0000 mg | ORAL_TABLET | Freq: Every day | ORAL | 6 refills | Status: DC

## 2020-04-28 MED ORDER — ATORVASTATIN CALCIUM 20 MG PO TABS: 20.0000 mg | ORAL_TABLET | Freq: Every day | ORAL | 6 refills | Status: DC

## 2020-04-28 MED ORDER — GABAPENTIN 300 MG PO CAPS: 600.0000 mg | ORAL_CAPSULE | Freq: Three times a day (TID) | ORAL | 6 refills | Status: DC

## 2020-04-28 MED ORDER — GLIPIZIDE 10 MG PO TABS: 10.0000 mg | ORAL_TABLET | Freq: Two times a day (BID) | ORAL | 6 refills | Status: DC

## 2020-04-28 NOTE — Telephone Encounter (Signed)
-----   Message from [name], MD sent at 04/27/2020  9:03 AM EDT ----- Please inform her that she tested positive for bacterial vaginosis and she has a yeast infection.  I have sent to medications for treatment for this to her pharmacy.

## 2020-04-28 NOTE — Telephone Encounter (Signed)
Pt was informed of lab results at [facility] today.

## 2020-04-28 NOTE — Progress Notes (Signed)
Subjective:  Patient ID: Megan Lane, female    DOB: 01/10/1956  Age: 65 y.o. MRN: [id number]  CC: Diabetes   HPI Megan Lane is a 65year old female with Type 2 Diabetes Mellitus (A1c7.6), Diabetic neuropathy, Hyperlipidemia, Hypertension here for a follow-up visit. When she bends to pick something up her back hurts and pain does not radiate. Sometimes when she is walking it takes her down and she almost falls.  She does have right-sided sciatica for which she is on gabapentin.  Does not use an ambulatory assistive device.  She has no loss of sphincteric function. At the moment pain is described as mild to moderate.  With regards to her diabetes mellitus she has been exercising and eating right.  She has also been compliant with her medications including her statin and antihypertensives.  Her neuropathy is controlled on gabapentin and she denies visual concerns and has had no hypoglycemia. Denies presence of chest pain, dyspnea, pedal edema. She lost her sister not long ago but states she is doing well. Past Medical History:  Diagnosis Date  . Asthma    as a child  . Diabetes mellitus   . Hypercholesterolemia   . Hypertension   . Stroke ([place])   . Ventral hernia   . Wears glasses     Past Surgical History:  Procedure Laterality Date  . ABDOMINAL HYSTERECTOMY    . KNEE SURGERY    . LEG SURGERY     left lower extremity  . TUBAL LIGATION    . VENTRAL HERNIA REPAIR N/A 05/29/2019   Procedure: REPAIR OF VENTRAL HERNIA WITH MESH;  Surgeon: Erroll Luna, MD;  Location: [facility];  Service: General;  Laterality: N/A;    Family History  Problem Relation Age of Onset  . Diabetes Mother   . Hypertension Mother   . Stroke Mother   . Heart disease Brother   . Breast cancer Sister 52    Allergies  Allergen Reactions  . Penicillins Rash    Did it involve swelling of the face/tongue/throat, SOB, or low BP? {[id number]:[id number] Did it involve sudden or severe rash/hives, skin  peeling, or any reaction on the inside of your mouth or nose? Y Did you need to seek medical attention at a hospital or doctor's office? Y When did it last happen?Several years ago If all above answers are "NO", may proceed with cephalosporin use.     Outpatient Medications Prior to Visit  Medication Sig Dispense Refill  . aspirin EC 81 MG tablet Take 1 tablet (81 mg total) by mouth daily. Swallow whole. 30 tablet 11  . glucose blood test strip Use as instructed 100 each 12  . ibuprofen (ADVIL) 600 MG tablet Take 1 tablet (600 mg total) by mouth 3 (three) times daily. With food X 7 days then prn pain 60 tablet 2  . Lancets (ACCU-CHEK SOFT TOUCH) lancets Use as instructed 100 each 12  . methocarbamol (ROBAXIN) 500 MG tablet Take 2 tablets (1,000 mg total) by mouth 3 (three) times daily. X 7 days then prn muscle spasm (Patient taking differently: Take 1,000 mg by mouth at bedtime.) 90 tablet 0  . metroNIDAZOLE (FLAGYL) 500 MG tablet Take 1 tablet (500 mg total) by mouth 2 (two) times daily. 14 tablet 0  . atorvastatin (LIPITOR) 20 MG tablet Take 1 tablet by mouth once daily 30 tablet 0  . FARXIGA 10 MG TABS tablet TAKE 1 TABLET BY MOUTH ONCE DAILY BEFORE  BREAKFAST 30 tablet 0  .  gabapentin (NEURONTIN) 300 MG capsule TAKE 2 CAPSULES BY MOUTH THREE TIMES DAILY 100 capsule 0  . glipiZIDE (GLUCOTROL) 10 MG tablet Take 1 tablet (10 mg total) by mouth 2 (two) times daily before a meal. 60 tablet 6  . hydrochlorothiazide (HYDRODIURIL) 25 MG tablet Take 1 tablet (25 mg total) by mouth daily. 30 tablet 6  . metFORMIN (GLUCOPHAGE) 1000 MG tablet Take 1 tablet (1,000 mg total) by mouth 2 (two) times daily with a meal. 60 tablet 6  . HYDROcodone-acetaminophen (NORCO/VICODIN) 5-325 MG tablet Take 1 tablet by mouth every 6 (six) hours as needed for moderate pain. (Patient not taking: Reported on 04/28/2020) 15 tablet 0   No facility-administered medications prior to visit.     ROS Review of Systems   Constitutional: Negative for activity change, appetite change and fatigue.  HENT: Negative for congestion, sinus pressure and sore throat.   Eyes: Negative for visual disturbance.  Respiratory: Negative for cough, chest tightness, shortness of breath and wheezing.   Cardiovascular: Negative for chest pain and palpitations.  Gastrointestinal: Negative for abdominal distention, abdominal pain and constipation.  Endocrine: Negative for polydipsia.  Genitourinary: Negative for dysuria and frequency.  Musculoskeletal: Negative for arthralgias and back pain.  Skin: Negative for rash.  Neurological: Negative for tremors, light-headedness and numbness.  Hematological: Does not bruise/bleed easily.  Psychiatric/Behavioral: Negative for agitation and behavioral problems.    Objective:  BP 137/74   Pulse 75   Resp 18   Ht 6' (1.829 m)   Wt 236 lb 3.2 oz (107.1 kg)   SpO2 96%   BMI 32.03 kg/m   BP/Weight 04/28/2020 04/13/2020 90/24/0973  Systolic BP 532 992 426  Diastolic BP 74 85 81  Wt. (Lbs) 236.2 232.8 -  BMI 32.03 31.57 -      Physical Exam Constitutional:      Appearance: She is well-developed.  Neck:     Vascular: No JVD.  Cardiovascular:     Rate and Rhythm: Normal rate.     Heart sounds: Normal heart sounds. No murmur heard.   Pulmonary:     Effort: Pulmonary effort is normal.     Breath sounds: Normal breath sounds. No wheezing or rales.  Chest:     Chest wall: No tenderness.  Abdominal:     General: Bowel sounds are normal. There is no distension.     Palpations: Abdomen is soft. There is no mass.     Tenderness: There is no abdominal tenderness.  Musculoskeletal:        General: Normal range of motion.     Right lower leg: No edema.     Left lower leg: No edema.  Neurological:     Mental Status: She is alert and oriented to person, place, and time.  Psychiatric:        Mood and Affect: Mood normal.     CMP Latest Ref Rng & Units 01/23/2020 01/22/2020  01/22/2020  Glucose 70 - 99 mg/dL 187(H) 228(H) 233(H)  BUN 8 - 23 mg/dL 20 27(H) 25(H)  Creatinine 0.44 - 1.00 mg/dL 1.15(H) 2.00(H) 2.12(H)  Sodium 135 - 145 mmol/L 141 139 138  Potassium 3.5 - 5.1 mmol/L 3.5 3.5 3.5  Chloride 98 - 111 mmol/L 100 100 97(L)  CO2 22 - 32 mmol/L 29 - 23  Calcium 8.9 - 10.3 mg/dL 9.3 - 9.4  Total Protein 6.5 - 8.1 g/dL - - 7.5  Total Bilirubin 0.3 - 1.2 mg/dL - - 0.6  Alkaline Phos 38 -  126 U/L - - 98  AST 15 - 41 U/L - - 29  ALT 0 - 44 U/L - - 32    Lipid Panel     Component Value Date/Time   CHOL 168 01/23/2020 0212   CHOL 186 09/03/2018 1359   TRIG 303 (H) 01/23/2020 0212   HDL 37 (L) 01/23/2020 0212   HDL 48 09/03/2018 1359   CHOLHDL 4.5 01/23/2020 0212   VLDL 61 (H) 01/23/2020 0212   LDLCALC 70 01/23/2020 0212   LDLCALC 94 09/03/2018 1359    CBC    Component Value Date/Time   WBC 4.9 01/23/2020 0746   RBC 4.67 01/23/2020 0746   HGB 11.9 (L) 01/23/2020 0746   HCT 39.3 01/23/2020 0746   PLT 316 01/23/2020 0746   MCV 84.2 01/23/2020 0746   MCH 25.5 (L) 01/23/2020 0746   MCHC 30.3 01/23/2020 0746   RDW 14.8 01/23/2020 0746   LYMPHSABS 3.2 01/22/2020 1057   MONOABS 0.7 01/22/2020 1057   EOSABS 0.2 01/22/2020 1057   BASOSABS 0.1 01/22/2020 1057    Lab Results  Component Value Date   HGBA1C 7.6 (A) 04/28/2020    Assessment & Plan:  1. Type 2 diabetes mellitus with other specified complication, without long-term current use of insulin (HCC) Slightly above goal with A1c of 7.6; goal is less than 7.0 She declines initiation of injectable but wants to work on her lifestyle modifications Counseled on Diabetic diet, my plate method, 025 minutes of moderate intensity exercise/week Blood sugar logs with fasting goals of 80-120 mg/dl, random of less than 180 and in the event of sugars less than 60 mg/dl or greater than 400 mg/dl encouraged to notify the clinic. Advised on the need for annual eye exams, annual foot exams, Pneumonia  vaccine. - POCT glycosylated hemoglobin (Hb A1C) - POCT glucose (manual entry)  2. Type 2 diabetes mellitus with diabetic neuropathy, without long-term current use of insulin (HCC) Neuropathy is controlled on gabapentin - metFORMIN (GLUCOPHAGE) 1000 MG tablet; Take 1 tablet (1,000 mg total) by mouth 2 (two) times daily with a meal.  Dispense: 60 tablet; Refill: 6 - dapagliflozin propanediol (FARXIGA) 10 MG TABS tablet; Take 1 tablet (10 mg total) by mouth daily before breakfast.  Dispense: 30 tablet; Refill: 6 - gabapentin (NEURONTIN) 300 MG capsule; Take 2 capsules (600 mg total) by mouth 3 (three) times daily.  Dispense: 180 capsule; Refill: 6 - glipiZIDE (GLUCOTROL) 10 MG tablet; Take 1 tablet (10 mg total) by mouth 2 (two) times daily before a meal.  Dispense: 60 tablet; Refill: 6  3. Hyperlipidemia associated with type 2 diabetes mellitus (HCC) Controlled Low-cholesterol diet - atorvastatin (LIPITOR) 20 MG tablet; Take 1 tablet (20 mg total) by mouth daily.  Dispense: 30 tablet; Refill: 6  4. Essential hypertension, benign Controlled Counseled on blood pressure goal of less than 130/80, low-sodium, DASH diet, medication compliance, 150 minutes of moderate intensity exercise per week. Discussed medication compliance, adverse effects. - hydrochlorothiazide (HYDRODIURIL) 25 MG tablet; Take 1 tablet (25 mg total) by mouth daily.  Dispense: 30 tablet; Refill: 6  5. Sciatica of right side Uncontrolled She is currently on gabapentin We have discussed referral to PT but she would like to think about this Advised to use warm compress    Meds ordered this encounter  Medications  . metFORMIN (GLUCOPHAGE) 1000 MG tablet    Sig: Take 1 tablet (1,000 mg total) by mouth 2 (two) times daily with a meal.    Dispense:  60 tablet    Refill:  6  . atorvastatin (LIPITOR) 20 MG tablet    Sig: Take 1 tablet (20 mg total) by mouth daily.    Dispense:  30 tablet    Refill:  6  . dapagliflozin  propanediol (FARXIGA) 10 MG TABS tablet    Sig: Take 1 tablet (10 mg total) by mouth daily before breakfast.    Dispense:  30 tablet    Refill:  6  . gabapentin (NEURONTIN) 300 MG capsule    Sig: Take 2 capsules (600 mg total) by mouth 3 (three) times daily.    Dispense:  180 capsule    Refill:  6    Enough until appt  . glipiZIDE (GLUCOTROL) 10 MG tablet    Sig: Take 1 tablet (10 mg total) by mouth 2 (two) times daily before a meal.    Dispense:  60 tablet    Refill:  6    Dose change  . hydrochlorothiazide (HYDRODIURIL) 25 MG tablet    Sig: Take 1 tablet (25 mg total) by mouth daily.    Dispense:  30 tablet    Refill:  6    Follow-up: Return in about 3 months (around 07/29/2020) for chronic disease management.       [name], MD, FAAFP. [facility] and [facility], [place]   04/29/2020, 2:41 PM

## 2020-04-28 NOTE — Patient Instructions (Signed)

## 2020-04-30 ENCOUNTER — Ambulatory Visit: Payer: Self-pay

## 2020-04-30 NOTE — Telephone Encounter (Signed)
Two attempts to reach pt.  Please see encounter

## 2020-04-30 NOTE — Telephone Encounter (Signed)
Patient called, left VM to return the call to the office to speak to a TN about the pain in her right side.     Pt requesting call back from nurse regarding pain on her right side.

## 2020-05-04 ENCOUNTER — Other Ambulatory Visit: Payer: Self-pay | Admitting: Family Medicine

## 2020-05-04 DIAGNOSIS — E1169 Type 2 diabetes mellitus with other specified complication: Secondary | ICD-10-CM

## 2020-05-04 DIAGNOSIS — E785 Hyperlipidemia, unspecified: Secondary | ICD-10-CM

## 2020-05-04 DIAGNOSIS — E1140 Type 2 diabetes mellitus with diabetic neuropathy, unspecified: Secondary | ICD-10-CM

## 2020-05-18 ENCOUNTER — Other Ambulatory Visit: Payer: Self-pay | Admitting: Family Medicine

## 2020-05-18 DIAGNOSIS — E1140 Type 2 diabetes mellitus with diabetic neuropathy, unspecified: Secondary | ICD-10-CM

## 2020-05-18 DIAGNOSIS — I10 Essential (primary) hypertension: Secondary | ICD-10-CM

## 2020-05-18 NOTE — Telephone Encounter (Signed)
Requested Prescriptions  Pending Prescriptions Disp Refills  . hydrochlorothiazide (HYDRODIURIL) 25 MG tablet [Pharmacy Med Name: hydroCHLOROthiazide 25 MG Oral Tablet] 90 tablet 1    Sig: Take 1 tablet by mouth once daily     Cardiovascular: Diuretics - Thiazide Failed - 05/18/2020  7:24 PM      Failed - Cr in normal range and within 360 days    Creat  Date Value Ref Range Status  02/09/2015 1.00 0.50 - 1.05 mg/dL Final   Creatinine, Ser  Date Value Ref Range Status  01/23/2020 1.15 (H) 0.44 - 1.00 mg/dL Final   Creatinine, POC  Date Value Ref Range Status  12/04/2016 30 mg/dL Final         Passed - Ca in normal range and within 360 days    Calcium  Date Value Ref Range Status  01/23/2020 9.3 8.9 - 10.3 mg/dL Final   Calcium, Ion  Date Value Ref Range Status  01/22/2020 1.15 1.15 - 1.40 mmol/L Final         Passed - K in normal range and within 360 days    Potassium  Date Value Ref Range Status  01/23/2020 3.5 3.5 - 5.1 mmol/L Final         Passed - Na in normal range and within 360 days    Sodium  Date Value Ref Range Status  01/23/2020 141 135 - 145 mmol/L Final  08/18/2019 140 134 - 144 mmol/L Final         Passed - Last BP in normal range    BP Readings from Last 1 Encounters:  04/28/20 137/74         Passed - Valid encounter within last 6 months    Recent Outpatient Visits          2 weeks ago Type 2 diabetes mellitus with other specified complication, without long-term current use of insulin (HCC)   [place], [place], MD   6 months ago Type 2 diabetes mellitus with diabetic neuropathy, without long-term current use of insulin ([place])   [place], [place], MD   9 months ago Type 2 diabetes mellitus with diabetic neuropathy, without long-term current use of insulin ([place])   [place], Charlane Ferretti, MD   1 year ago Acute bilateral low back pain  with right-sided sciatica   [place], [place]   1 year ago Umbilical hernia without obstruction and without gangrene   [place], [place], MD      Future Appointments            In 2 months [name], MD [place]           . FARXIGA 10 MG TABS tablet [Pharmacy Med Name: Farxiga 10 MG Oral Tablet] 90 tablet 1    Sig: TAKE 1 TABLET BY MOUTH ONCE DAILY BEFORE BREAKFAST     Endocrinology:  Diabetes - SGLT2 Inhibitors Failed - 05/18/2020  7:24 PM      Failed - Cr in normal range and within 360 days    Creat  Date Value Ref Range Status  02/09/2015 1.00 0.50 - 1.05 mg/dL Final   Creatinine, Ser  Date Value Ref Range Status  01/23/2020 1.15 (H) 0.44 - 1.00 mg/dL Final   Creatinine, POC  Date Value Ref Range Status  12/04/2016 30 mg/dL Final  Passed - LDL in normal range and within 360 days    LDL Calculated  Date Value Ref Range Status  09/03/2018 94 0 - 99 mg/dL Final   LDL Cholesterol  Date Value Ref Range Status  01/23/2020 70 0 - 99 mg/dL Final    Comment:           Total Cholesterol/HDL:CHD Risk Coronary Heart Disease Risk Table                     Men   Women  1/2 Average Risk   3.4   3.3  Average Risk       5.0   4.4  2 X Average Risk   9.6   7.1  3 X Average Risk  23.4   11.0        Use the calculated Patient Ratio above and the CHD Risk Table to determine the patient's CHD Risk.        ATP III CLASSIFICATION (LDL):  <100     mg/dL   Optimal  100-129  mg/dL   Near or Above                    Optimal  130-159  mg/dL   Borderline  160-189  mg/dL   High  >190     mg/dL   Very High Performed at [facility] [address], [place], [place] [address]          Passed - HBA1C is between 0 and 7.9 and within 180 days    HbA1c, POC (controlled diabetic range)  Date Value Ref Range Status  04/28/2020 7.6 (A) 0.0 - 7.0 %  Final         Passed - AA eGFR in normal range and within 360 days    GFR, Est African American  Date Value Ref Range Status  08/12/2014 >89 mL/min Final   GFR calc Af Amer  Date Value Ref Range Status  08/18/2019 64 >59 mL/min/1.73 Final    Comment:    **[facility] currently reports eGFR in compliance with the current**   recommendations of the [employer]. [facility] will   update reporting as new guidelines are published from the NKF-ASN   Task force.    GFR, Est Non African American  Date Value Ref Range Status  08/12/2014 82 mL/min Final    Comment:      The estimated GFR is a calculation valid for adults (>=58 years old) that uses the CKD-EPI algorithm to adjust for age and sex. It is   not to be used for children, pregnant women, hospitalized patients,    patients on dialysis, or with rapidly changing kidney function. According to the NKDEP, eGFR >89 is normal, 60-89 shows mild impairment, 30-59 shows moderate impairment, 15-29 shows severe impairment and <15 is ESRD.      GFR, Estimated  Date Value Ref Range Status  01/23/2020 54 (L) >60 mL/min Final    Comment:    (NOTE) Calculated using the CKD-EPI Creatinine Equation (2021)          Passed - Valid encounter within last 6 months    Recent Outpatient Visits          2 weeks ago Type 2 diabetes mellitus with other specified complication, without long-term current use of insulin ([place])   [place], Charlane Ferretti, MD   6 months ago Type 2 diabetes mellitus with diabetic neuropathy,  without long-term current use of insulin ([place])   [place], [place], MD   9 months ago Type 2 diabetes mellitus with diabetic neuropathy, without long-term current use of insulin ([place])   [place], Charlane Ferretti, MD   1 year ago Acute bilateral low back pain with right-sided sciatica   [place], [place]   1 year ago Umbilical hernia without obstruction and without gangrene   [place], [place], MD      Future Appointments            In 2 months [name], MD [place]

## 2020-06-19 ENCOUNTER — Other Ambulatory Visit: Payer: Self-pay | Admitting: Family Medicine

## 2020-06-19 DIAGNOSIS — E1140 Type 2 diabetes mellitus with diabetic neuropathy, unspecified: Secondary | ICD-10-CM

## 2020-06-23 ENCOUNTER — Encounter (HOSPITAL_COMMUNITY): Payer: Self-pay | Admitting: Emergency Medicine

## 2020-06-23 ENCOUNTER — Emergency Department (HOSPITAL_COMMUNITY): Payer: BC Managed Care – PPO

## 2020-06-23 ENCOUNTER — Emergency Department (HOSPITAL_COMMUNITY)
Admission: EM | Admit: 2020-06-23 | Discharge: 2020-06-23 | Disposition: A | Discharge status: Home / Self Care | Payer: BC Managed Care – PPO | Attending: Emergency Medicine | Admitting: Emergency Medicine

## 2020-06-23 DIAGNOSIS — E119 Type 2 diabetes mellitus without complications: Secondary | ICD-10-CM | POA: Diagnosis not present

## 2020-06-23 DIAGNOSIS — I10 Essential (primary) hypertension: Secondary | ICD-10-CM | POA: Diagnosis not present

## 2020-06-23 DIAGNOSIS — R079 Chest pain, unspecified: Secondary | ICD-10-CM

## 2020-06-23 DIAGNOSIS — J45909 Unspecified asthma, uncomplicated: Secondary | ICD-10-CM | POA: Insufficient documentation

## 2020-06-23 DIAGNOSIS — Z7982 Long term (current) use of aspirin: Secondary | ICD-10-CM | POA: Insufficient documentation

## 2020-06-23 DIAGNOSIS — R0789 Other chest pain: Secondary | ICD-10-CM

## 2020-06-23 DIAGNOSIS — Z79899 Other long term (current) drug therapy: Secondary | ICD-10-CM | POA: Insufficient documentation

## 2020-06-23 DIAGNOSIS — Z87891 Personal history of nicotine dependence: Secondary | ICD-10-CM | POA: Insufficient documentation

## 2020-06-23 DIAGNOSIS — Z7984 Long term (current) use of oral hypoglycemic drugs: Secondary | ICD-10-CM | POA: Diagnosis not present

## 2020-06-23 DIAGNOSIS — I700 Atherosclerosis of aorta: Secondary | ICD-10-CM | POA: Diagnosis not present

## 2020-06-23 LAB — CBC
HCT: 40.3 % (ref 36.0–46.0)
Hemoglobin: 12.2 g/dL (ref 12.0–15.0)
MCH: 25.6 pg — ABNORMAL LOW (ref 26.0–34.0)
MCHC: 30.3 g/dL (ref 30.0–36.0)
MCV: 84.5 fL (ref 80.0–100.0)
Platelets: 370 10*3/uL (ref 150–400)
RBC: 4.77 MIL/uL (ref 3.87–5.11)
RDW: 14.7 % (ref 11.5–15.5)
WBC: 6.1 10*3/uL (ref 4.0–10.5)
nRBC: 0 % (ref 0.0–0.2)

## 2020-06-23 LAB — BASIC METABOLIC PANEL
Anion gap: 10 (ref 5–15)
BUN: 11 mg/dL (ref 8–23)
CO2: 26 mmol/L (ref 22–32)
Calcium: 9.4 mg/dL (ref 8.9–10.3)
Chloride: 102 mmol/L (ref 98–111)
Creatinine, Ser: 1.04 mg/dL — ABNORMAL HIGH (ref 0.44–1.00)
GFR, Estimated: >60 mL/min (ref >60)
Glucose, Bld: 163 mg/dL — ABNORMAL HIGH (ref 70–99)
Potassium: 4 mmol/L (ref 3.5–5.1)
Sodium: 138 mmol/L (ref 135–145)

## 2020-06-23 LAB — TROPONIN I (HIGH SENSITIVITY)
Troponin I (High Sensitivity): 6 ng/L (ref <18)
Troponin I (High Sensitivity): 5 ng/L (ref <18)

## 2020-06-23 IMAGING — CR DG CHEST 1V
1 series · 1 of 1 positions shown · non-contrast
Comparison: [DATE]

CLINICAL DATA: Chest pain x1 hour.

EXAM:
CHEST  1 VIEW

[chest pa]
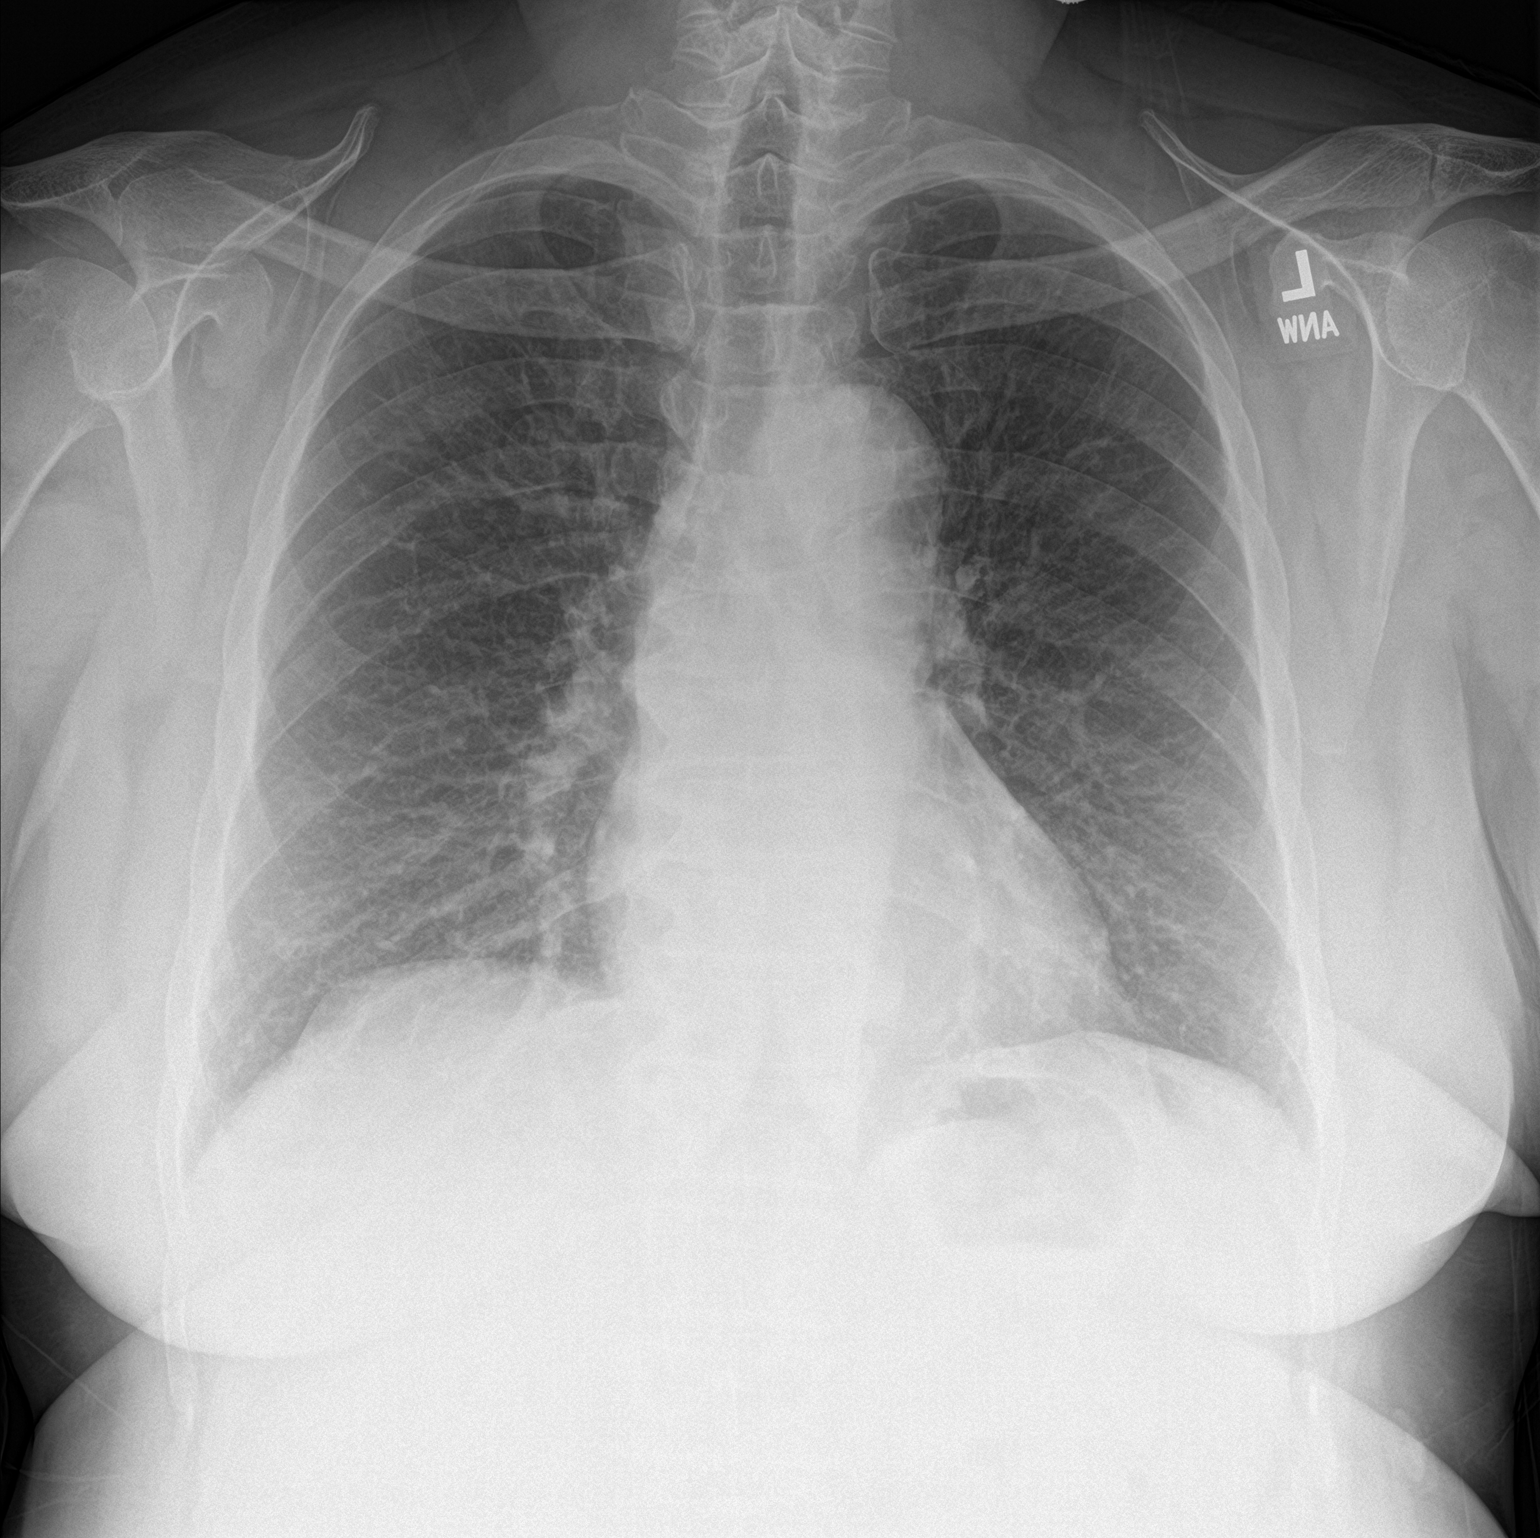

[1 of 1 positions shown; findings below may reference images not displayed]

FINDINGS: Mild, chronic appearing increased interstitial lung markings are
seen. There is no evidence of focal consolidation, pleural effusion
or pneumothorax. The heart size and mediastinal contours are within
normal limits. Mild calcification of the aortic arch is seen.
Degenerative changes are noted throughout the thoracic spine.
IMPRESSION: No active cardiopulmonary disease.

## 2020-06-23 NOTE — ED Provider Notes (Addendum)
Emergency Medicine Provider Triage Evaluation Note  Megan Lane , a 65 y.o. female  was evaluated in triage.  Pt complains of CP, onset 12:30, mid sternal, dosen't radiate, no associated symptoms, no cardiac history. 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 Dull in nature, tight. Worse with deep breathing. Pain is not worse with exertion.  Similar pain previously, seen in the ER and told related to stress, not feeling stressed today.  Hx DM, CBG 130 today.  Former smoker (65yo-64yo) History of hyperlipidemia, no history of HTN. Brother with bypass around 72yo.  Took 325mg  ASA this morning.  Review of Systems  Positive: CP Negative: SHOB, nausea, vomiting.   Physical Exam  There were no vitals taken for this visit. Gen:   Awake, no distress, appears uncomfortable.  Resp:  Normal effort  MSK:   Moves extremities without difficulty  Other:    Medical Decision Making  Medically screening exam initiated at 1:28 PM.  Appropriate orders placed.  Megan Lane was informed that the remainder of the evaluation will be completed by another provider, this initial triage assessment does not replace that evaluation, and the importance of remaining in the ED until their evaluation is complete.     Tacy Learn, PA-C 06/23/20 1333    Tacy Learn, PA-C 06/23/20 1334    Quintella Reichert, MD 06/24/20 [phone]

## 2020-06-23 NOTE — Discharge Instructions (Signed)
You have been evaluated for chest pain.  Fortunately we did not find any concerning finding during this visit.  Call and follow-up closely with your primary care doctor as you will need further evaluation of your chest discomfort, which may include an outpatient cardiac stress test.  Return if you have any concern.

## 2020-06-23 NOTE — ED Provider Notes (Signed)
[facility] EMERGENCY DEPARTMENT Provider Note   CSN: [id number] Arrival date & time: 06/23/20  1326     History No chief complaint on file.   Megan Lane is a 65 y.o. female.  The history is provided by the patient and medical records. No language interpreter was used.     65 year old female significant history of hypertension, hypercholesterolemia, diabetes, prior stroke who presents for evaluation of chest pain.  Patient states she was at work and around 5 hours ago while standing she developed an acute onset of sharp tightness sensation to her mid chest lasting for approximately 1 minute.  She did states she began to sweat but denies any lightheadedness dizziness nausea or shortness of breath.  She decided to come to the ER for evaluation.  She reports she had 1 similar episode a year ago and was seen in the ED and subsequently states it was likely stress related.  She denies feeling stressed out today.  She denies tobacco or alcohol use.  She denies any prior history of cardiac disease.  No recent heavy lifting.  No she did not eat anything prior to the event.  She has been compliant with her medication.  She reports she has been symptom-free since.  Past Medical History:  Diagnosis Date  . Asthma    as a child  . Diabetes mellitus   . Hypercholesterolemia   . Hypertension   . Stroke ([place])   . Ventral hernia   . Wears glasses     Patient Active Problem List   Diagnosis Date Noted  . 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 Acute bronchitis 07/09/2016  . CAP (community acquired pneumonia) 07/08/2016  . Chest pain 07/08/2016  . Headache 07/08/2016  . Weakness 07/08/2016  . HLD (hyperlipidemia) 07/08/2016  . Type 2 diabetes mellitus ([place]) 10/16/2013  . Back muscle spasm 04/30/2013  . Nasal congestion 04/30/2013  . Essential hypertension, benign 04/30/2013  . Pap smear for cervical cancer screening  03/20/2013    Past Surgical History:  Procedure Laterality Date  . ABDOMINAL HYSTERECTOMY    . KNEE SURGERY    . LEG SURGERY     left lower extremity  . TUBAL LIGATION    . VENTRAL HERNIA REPAIR N/A 05/29/2019   Procedure: REPAIR OF VENTRAL HERNIA WITH MESH;  Surgeon: Erroll Luna, MD;  Location: [facility];  Service: General;  Laterality: N/A;     OB History   No obstetric history on file.     Family History  Problem Relation Age of Onset  . Diabetes Mother   . Hypertension Mother   . Stroke Mother   . Heart disease Brother   . Breast cancer Sister 99    Social History   Tobacco Use  . Smoking status: Former Smoker    Quit date: 08/11/2005    Years since quitting: 14.8  . Smokeless tobacco: Never Used  Vaping Use  . Vaping Use: Never used  Substance Use Topics  . Alcohol use: No  . Drug use: No    Home Medications Prior to Admission medications   Medication Sig Start Date End Date Taking? Authorizing Provider  aspirin EC 81 MG tablet Take 1 tablet (81 mg total) by mouth daily. Swallow whole. 01/23/20   Rinehuls, Early Chars, PA-C  atorvastatin (LIPITOR) 20 MG tablet Take 1 tablet (20 mg total) by mouth daily. 04/28/20   [name], MD  FARXIGA 10 MG TABS  tablet TAKE 1 TABLET BY MOUTH ONCE DAILY BEFORE BREAKFAST 05/18/20   [name], MD  gabapentin (NEURONTIN) 300 MG capsule Take 2 capsules (600 mg total) by mouth 3 (three) times daily. 04/28/20   [name], MD  glipiZIDE (GLUCOTROL) 10 MG tablet TAKE 1 TABLET BY MOUTH TWICE DAILY BEFORE  A  MEAL. 06/19/20   [name], MD  glucose blood test strip Use as instructed 02/12/17   Alfonse Spruce, FNP  hydrochlorothiazide (HYDRODIURIL) 25 MG tablet Take 1 tablet by mouth once daily 05/18/20   [name], MD  HYDROcodone-acetaminophen (NORCO/VICODIN) 5-325 MG tablet Take 1 tablet by mouth every 6 (six) hours as needed for moderate pain. Patient not taking: Reported on 04/28/2020 05/29/19   Erroll Luna, MD  ibuprofen (ADVIL) 600 MG tablet Take 1 tablet (600 mg total) by mouth 3 (three) times daily. With food X 7 days then prn pain 04/02/20   [name], MD  Lancets (ACCU-CHEK SOFT TOUCH) lancets Use as instructed 02/12/17   Alfonse Spruce, FNP  metFORMIN (GLUCOPHAGE) 1000 MG tablet Take 1 tablet (1,000 mg total) by mouth 2 (two) times daily with a meal. 04/28/20   [name], MD  methocarbamol (ROBAXIN) 500 MG tablet Take 2 tablets (1,000 mg total) by mouth 3 (three) times daily. X 7 days then prn muscle spasm Patient taking differently: Take 1,000 mg by mouth at bedtime. 03/20/19   Argentina Donovan, PA-C  metroNIDAZOLE (FLAGYL) 500 MG tablet Take 1 tablet (500 mg total) by mouth 2 (two) times daily. 04/27/20   [name], MD    Allergies    Penicillins  Review of Systems   Review of Systems  All other systems reviewed and are negative.   Physical Exam Updated Vital Signs BP 134/71 (BP Location: Left Arm)   Pulse 84   Temp 98.4 F (36.9 C)   Resp 20   SpO2 98%   Physical Exam Vitals and nursing note reviewed.  Constitutional:      General: She is not in acute distress.    Appearance: She is well-developed.  HENT:     Head: Atraumatic.  Eyes:     Conjunctiva/sclera: Conjunctivae normal.  Cardiovascular:     Rate and Rhythm: Normal rate and regular rhythm.     Pulses: Normal pulses.     Heart sounds: Normal heart sounds.  Pulmonary:     Effort: Pulmonary effort is normal.     Breath sounds: Normal breath sounds.  Abdominal:     Palpations: Abdomen is soft.     Tenderness: There is no abdominal tenderness.  Musculoskeletal:     Cervical back: Neck supple.  Skin:    General: Skin is warm.     Findings: No rash.  Neurological:     Mental Status: She is alert and oriented to person, place, and time.     ED Results / Procedures / Treatments   Labs (all labs ordered are listed, but only abnormal results are displayed) Labs Reviewed  BASIC  METABOLIC PANEL - Abnormal; Notable for the following components:      Result Value   Glucose, Bld 163 (*)    Creatinine, Ser 1.04 (*)    All other components within normal limits  CBC - Abnormal; Notable for the following components:   MCH 25.6 (*)    All other components within normal limits  TROPONIN I (HIGH SENSITIVITY)  TROPONIN I (HIGH SENSITIVITY)    EKG None   Date: 06/23/2020  Rate: 87  Rhythm: normal sinus rhythm  QRS Axis: normal  Intervals: normal  ST/T Wave abnormalities: normal  Conduction Disutrbances: none  Narrative Interpretation:   Old EKG Reviewed: No significant changes noted     Radiology DG Chest 1 View  Result Date: 06/23/2020 CLINICAL DATA:  Chest pain x1 hour. EXAM: CHEST  1 VIEW COMPARISON:  July 26, 2017 FINDINGS: Mild, chronic appearing increased interstitial lung markings are seen. There is no evidence of focal consolidation, pleural effusion or pneumothorax. The heart size and mediastinal contours are within normal limits. Mild calcification of the aortic arch is seen. Degenerative changes are noted throughout the thoracic spine. IMPRESSION: No active cardiopulmonary disease. Electronically Signed   By: Virgina Norfolk M.D.   On: 06/23/2020 15:11    Procedures Procedures   Medications Ordered in ED Medications - No data to display  ED Course  I have reviewed the triage vital signs and the nursing notes.  Pertinent labs & imaging results that were available during my care of the patient were reviewed by me and considered in my medical decision making (see chart for details).    MDM Rules/Calculators/A&P                          BP 120/87   Pulse 64   Temp 98.4 F (36.9 C)   Resp 13   SpO2 98%   Final Clinical Impression(s) / ED Diagnoses Final diagnoses:  Atypical chest pain    Rx / DC Orders ED Discharge Orders    None     4:24 PM Patient here with acute onset of midsternal chest pain lasting for approximate 1 minute.   Pain is atypical of ACS.  She is currently chest pain-free.  Her HEAR scores of 4, will awaits delta troponin.  6:18 PM No delta troponin, at this time I encourage patient to follow-up closely with PCP for outpatient work-up of her chest pain.  Return precaution given.  Patient remains chest pain-free and is stable for discharge.   Domenic Moras, PA-C 06/23/20 1818    Luna Fuse, MD 06/25/20 [phone]

## 2020-06-23 NOTE — ED Triage Notes (Signed)
Pt here from home with c/o cp center of chest non radiating , no n/v or sob

## 2020-06-28 ENCOUNTER — Ambulatory Visit: Payer: BC Managed Care – PPO | Admitting: Podiatry

## 2020-06-30 ENCOUNTER — Ambulatory Visit: Payer: BC Managed Care – PPO | Admitting: Podiatry

## 2020-07-19 ENCOUNTER — Other Ambulatory Visit: Payer: Self-pay | Admitting: Family Medicine

## 2020-07-19 DIAGNOSIS — E785 Hyperlipidemia, unspecified: Secondary | ICD-10-CM

## 2020-07-19 DIAGNOSIS — E1169 Type 2 diabetes mellitus with other specified complication: Secondary | ICD-10-CM

## 2020-07-28 ENCOUNTER — Encounter: Payer: Self-pay | Admitting: Family Medicine

## 2020-07-28 ENCOUNTER — Ambulatory Visit: Payer: BC Managed Care – PPO | Attending: Family Medicine | Admitting: Family Medicine

## 2020-07-28 ENCOUNTER — Other Ambulatory Visit: Payer: Self-pay

## 2020-07-28 VITALS — BP 107/66 | HR 92 | Ht 72.0 in | Wt 232.0 lb

## 2020-07-28 DIAGNOSIS — Z1211 Encounter for screening for malignant neoplasm of colon: Secondary | ICD-10-CM | POA: Diagnosis not present

## 2020-07-28 DIAGNOSIS — M5441 Lumbago with sciatica, right side: Secondary | ICD-10-CM

## 2020-07-28 DIAGNOSIS — I152 Hypertension secondary to endocrine disorders: Secondary | ICD-10-CM

## 2020-07-28 DIAGNOSIS — G8929 Other chronic pain: Secondary | ICD-10-CM

## 2020-07-28 DIAGNOSIS — E1159 Type 2 diabetes mellitus with other circulatory complications: Secondary | ICD-10-CM

## 2020-07-28 DIAGNOSIS — E1169 Type 2 diabetes mellitus with other specified complication: Secondary | ICD-10-CM | POA: Diagnosis not present

## 2020-07-28 DIAGNOSIS — E669 Obesity, unspecified: Secondary | ICD-10-CM

## 2020-07-28 LAB — GLUCOSE, POCT (MANUAL RESULT ENTRY): POC Glucose: 181 mg/dl — AB (ref 70–99)

## 2020-07-28 LAB — POCT GLYCOSYLATED HEMOGLOBIN (HGB A1C): HbA1c, POC (controlled diabetic range): 8.6 % — AB (ref 0.0–7.0)

## 2020-07-28 MED ORDER — CONTOUR NEXT MONITOR W/DEVICE KIT
1.0000 | PACK | Freq: Three times a day (TID) | 0 refills | Status: DC | PRN
Start: 2020-07-28 — End: 2021-09-16

## 2020-07-28 MED ORDER — METHOCARBAMOL 500 MG PO TABS: 1000.0000 mg | ORAL_TABLET | Freq: Two times a day (BID) | ORAL | 1 refills | Status: DC | PRN

## 2020-07-28 MED ORDER — CONTOUR NEXT TEST VI STRP
ORAL_STRIP | 12 refills | Status: DC
Start: 2020-07-28 — End: 2021-09-16

## 2020-07-28 MED ORDER — TRULICITY 0.75 MG/0.5ML ~~LOC~~ SOAJ: 0.7500 mg | SUBCUTANEOUS | 6 refills | Status: DC

## 2020-07-28 NOTE — Progress Notes (Signed)
Subjective:  Patient ID: Megan Lane, female    DOB: 12/10/1955  Age: 65 y.o. MRN: [id number]  CC: Diabetes   HPI Megan Lane  is a 65 year old female with Type 2 Diabetes Mellitus (A1c 8.6), Diabetic neuropathy, Hyperlipidemia, Hypertension here for a follow-up visit.  Interval History: Not up to date on eye exams but will be scheduling an appointment soon. Lost 4 lbs since her last visit. Compliant with her medications and a Diabetic diet but her A1c is 8.6 up from 7.6 previously. She has no neuropathy, visual or hypoglycemic symptoms. Compliant with her antihypertensive and Statin. She does have intermittent Sciatica which improves with the use of her Muscle relaxants.  Past Medical History:  Diagnosis Date   Asthma    as a child   Diabetes mellitus    Hypercholesterolemia    Hypertension    Stroke ([place])    Ventral hernia    Wears glasses     Past Surgical History:  Procedure Laterality Date   ABDOMINAL HYSTERECTOMY     KNEE SURGERY     LEG SURGERY     left lower extremity   TUBAL LIGATION     VENTRAL HERNIA REPAIR N/A 05/29/2019   Procedure: REPAIR OF VENTRAL HERNIA WITH MESH;  Surgeon: Erroll Luna, MD;  Location: [facility];  Service: General;  Laterality: N/A;    Family History  Problem Relation Age of Onset   Diabetes Mother    Hypertension Mother    Stroke Mother    Heart disease Brother    Breast cancer Sister 71    Allergies  Allergen Reactions   Penicillins Rash    Did it involve swelling of the face/tongue/throat, SOB, or low BP? {[id number] Did it involve sudden or severe rash/hives, skin peeling, or any reaction on the inside of your mouth or nose? Y Did you need to seek medical attention at a hospital or doctor's office? Y When did it last happen?  Several years ago     If all above answers are "NO", may proceed with cephalosporin use.     Outpatient Medications Prior to Visit  Medication Sig Dispense Refill   aspirin EC 81 MG  tablet Take 1 tablet (81 mg total) by mouth daily. Swallow whole. 30 tablet 11   atorvastatin (LIPITOR) 20 MG tablet Take 1 tablet (20 mg total) by mouth daily. 30 tablet 6   FARXIGA 10 MG TABS tablet TAKE 1 TABLET BY MOUTH ONCE DAILY BEFORE BREAKFAST 90 tablet 1   gabapentin (NEURONTIN) 300 MG capsule Take 2 capsules (600 mg total) by mouth 3 (three) times daily. 180 capsule 6   glipiZIDE (GLUCOTROL) 10 MG tablet TAKE 1 TABLET BY MOUTH TWICE DAILY BEFORE  A  MEAL. 60 tablet 4   hydrochlorothiazide (HYDRODIURIL) 25 MG tablet Take 1 tablet by mouth once daily 90 tablet 1   HYDROcodone-acetaminophen (NORCO/VICODIN) 5-325 MG tablet Take 1 tablet by mouth every 6 (six) hours as needed for moderate pain. 15 tablet 0   ibuprofen (ADVIL) 600 MG tablet Take 1 tablet (600 mg total) by mouth 3 (three) times daily. With food X 7 days then prn pain 60 tablet 2   Lancets (ACCU-CHEK SOFT TOUCH) lancets Use as instructed 100 each 12   metFORMIN (GLUCOPHAGE) 1000 MG tablet Take 1 tablet (1,000 mg total) by mouth 2 (two) times daily with a meal. 60 tablet 6   glucose blood test strip Use as instructed 100 each 12   methocarbamol (  ROBAXIN) 500 MG tablet Take 2 tablets (1,000 mg total) by mouth 3 (three) times daily. X 7 days then prn muscle spasm (Patient taking differently: Take 1,000 mg by mouth at bedtime.) 90 tablet 0   metroNIDAZOLE (FLAGYL) 500 MG tablet Take 1 tablet (500 mg total) by mouth 2 (two) times daily. 14 tablet 0   No facility-administered medications prior to visit.     ROS Review of Systems  Constitutional:  Negative for activity change, appetite change and fatigue.  HENT:  Negative for congestion, sinus pressure and sore throat.   Eyes:  Negative for visual disturbance.  Respiratory:  Negative for cough, chest tightness, shortness of breath and wheezing.   Cardiovascular:  Negative for chest pain and palpitations.  Gastrointestinal:  Negative for abdominal distention, abdominal pain and  constipation.  Endocrine: Negative for polydipsia.  Genitourinary:  Negative for dysuria and frequency.  Musculoskeletal:  Negative for arthralgias and back pain.  Skin:  Negative for rash.  Neurological:  Negative for tremors, light-headedness and numbness.  Hematological:  Does not bruise/bleed easily.  Psychiatric/Behavioral:  Negative for agitation and behavioral problems.    Objective:  BP 107/66   Pulse 92   Ht 6' (1.829 m)   Wt 232 lb (105.2 kg)   SpO2 97%   BMI 31.46 kg/m   BP/Weight 07/28/2020 06/23/2020 1/61/0960  Systolic BP 454 098 119  Diastolic BP 66 83 74  Wt. (Lbs) 232 - 236.2  BMI 31.46 - 32.03      Physical Exam Constitutional:      Appearance: She is well-developed.  Neck:     Vascular: No JVD.  Cardiovascular:     Rate and Rhythm: Normal rate.     Heart sounds: Normal heart sounds. No murmur heard. Pulmonary:     Effort: Pulmonary effort is normal.     Breath sounds: Normal breath sounds. No wheezing or rales.  Chest:     Chest wall: No tenderness.  Abdominal:     General: Bowel sounds are normal. There is no distension.     Palpations: Abdomen is soft. There is no mass.     Tenderness: There is no abdominal tenderness.  Musculoskeletal:        General: Normal range of motion.     Right lower leg: No edema.     Left lower leg: No edema.  Neurological:     Mental Status: She is alert and oriented to person, place, and time.  Psychiatric:        Mood and Affect: Mood normal.    CMP Latest Ref Rng & Units 06/23/2020 01/23/2020 01/22/2020  Glucose 70 - 99 mg/dL 163(H) 187(H) 228(H)  BUN 8 - 23 mg/dL 11 20 27(H)  Creatinine 0.44 - 1.00 mg/dL 1.04(H) 1.15(H) 2.00(H)  Sodium 135 - 145 mmol/L 138 141 139  Potassium 3.5 - 5.1 mmol/L 4.0 3.5 3.5  Chloride 98 - 111 mmol/L 102 100 100  CO2 22 - 32 mmol/L 26 29 -  Calcium 8.9 - 10.3 mg/dL 9.4 9.3 -  Total Protein 6.5 - 8.1 g/dL - - -  Total Bilirubin 0.3 - 1.2 mg/dL - - -  Alkaline Phos 38 - 126  U/L - - -  AST 15 - 41 U/L - - -  ALT 0 - 44 U/L - - -    Lipid Panel     Component Value Date/Time   CHOL 168 01/23/2020 0212   CHOL 186 09/03/2018 1359   TRIG 303 (H) 01/23/2020  6295   HDL 37 (L) 01/23/2020 0212   HDL 48 09/03/2018 1359   CHOLHDL 4.5 01/23/2020 0212   VLDL 61 (H) 01/23/2020 0212   LDLCALC 70 01/23/2020 0212   LDLCALC 94 09/03/2018 1359    CBC    Component Value Date/Time   WBC 6.1 06/23/2020 1334   RBC 4.77 06/23/2020 1334   HGB 12.2 06/23/2020 1334   HCT 40.3 06/23/2020 1334   PLT 370 06/23/2020 1334   MCV 84.5 06/23/2020 1334   MCH 25.6 (L) 06/23/2020 1334   MCHC 30.3 06/23/2020 1334   RDW 14.7 06/23/2020 1334   LYMPHSABS 3.2 01/22/2020 1057   MONOABS 0.7 01/22/2020 1057   EOSABS 0.2 01/22/2020 1057   BASOSABS 0.1 01/22/2020 1057    Lab Results  Component Value Date   HGBA1C 8.6 (A) 07/28/2020    Assessment & Plan:  1. Type 2 diabetes mellitus with other specified complication, without long-term current use of insulin (HCC) Uncontrolled with A1c 8.6; goal is <2.8 Trulicity added Counseled on Diabetic diet, my plate method, 413 minutes of moderate intensity exercise/week Blood sugar logs with fasting goals of 80-120 mg/dl, random of less than 180 and in the event of sugars less than 60 mg/dl or greater than 400 mg/dl encouraged to notify the clinic. Advised on the need for annual eye exams, annual foot exams, Pneumonia vaccine. - POCT glucose (manual entry) - POCT glycosylated hemoglobin (Hb A1C) - Dulaglutide (TRULICITY) 2.44 WN/0.2VO SOPN; Inject 0.75 mg into the skin once a week.  Dispense: 2 mL; Refill: 6 - Blood Glucose Monitoring Suppl (CONTOUR NEXT MONITOR) w/Device KIT; 1 each by Does not apply route 3 (three) times daily as needed.  Dispense: 1 kit; Refill: 0 - glucose blood (CONTOUR NEXT TEST) test strip; Use as instructed  Dispense: 100 each; Refill: 12  2. Chronic bilateral low back pain with right-sided  sciatica Stable Discussed back exercises - methocarbamol (ROBAXIN) 500 MG tablet; Take 2 tablets (1,000 mg total) by mouth 2 (two) times daily as needed for muscle spasms. X 7 days then prn muscle spasm  Dispense: 120 tablet; Refill: 1  3. Screening for colon cancer - Ambulatory referral to Gastroenterology  4. Obesity (BMI 30-39.9) Counseled on reducing portion sizes and avoiding late meals Hopefully addition of Trulicity will be beneficial with regards to weight loss  5. Hypertension associated with diabetes ([place]) Controlled Counseled on blood pressure goal of less than 130/80, low-sodium, DASH diet, medication compliance, 150 minutes of moderate intensity exercise per week. Discussed medication compliance, adverse effects.     Meds ordered this encounter  Medications   Dulaglutide (TRULICITY) 5.36 UY/4.0HK SOPN    Sig: Inject 0.75 mg into the skin once a week.    Dispense:  2 mL    Refill:  6   methocarbamol (ROBAXIN) 500 MG tablet    Sig: Take 2 tablets (1,000 mg total) by mouth 2 (two) times daily as needed for muscle spasms. X 7 days then prn muscle spasm    Dispense:  120 tablet    Refill:  1   Blood Glucose Monitoring Suppl (CONTOUR NEXT MONITOR) w/Device KIT    Sig: 1 each by Does not apply route 3 (three) times daily as needed.    Dispense:  1 kit    Refill:  0   glucose blood (CONTOUR NEXT TEST) test strip    Sig: Use as instructed    Dispense:  100 each    Refill:  12  Follow-up: Return in about 3 months (around 10/28/2020) for Medical conditions.       [name], MD, FAAFP. [facility] [place], [place]   07/29/2020, 1:55 PM

## 2020-07-28 NOTE — Patient Instructions (Signed)
Dulaglutide Injection What is this medication? DULAGLUTIDE (DOO la GLOO tide) controls blood sugar in people with type 2 diabetes. It is used with lifestyle changes like diet and exercise. It may lower the risk of problems that need treatment in the hospital. These problemsinclude heart attack or stroke. This medicine may be used for other purposes; ask your health care provider orpharmacist if you have questions. COMMON BRAND NAME(S): Trulicity What should I tell my care team before I take this medication? They need to know if you have any of these conditions: endocrine tumors (MEN 2) or if someone in your family had these tumors eye disease, vision problems history of pancreatitis kidney disease liver disease stomach or intestine problems thyroid cancer or if someone in your family had thyroid cancer an unusual or allergic reaction to dulaglutide, other medicines, foods, dyes, or preservatives pregnant or trying to get pregnant breast-feeding How should I use this medication? This medicine is injected under the skin. You will be taught how to prepare and give it. Take it as directed on the prescription label on the same day of each week. Do NOT prime the pen. Keep taking it unless your health care providertells you to stop. If you use this medicine with insulin, you should inject this medicine and the insulin separately. Do not mix them together. Do not give the injections rightnext to each other. Change (rotate) injection sites with each injection. This drug comes with INSTRUCTIONS FOR USE. Ask your pharmacist for directions on how to use this medicine. Read the information carefully. Talk to yourpharmacist or health care provider if you have questions. It is important that you put your used needles and syringes in a special sharps container. Do not put them in a trash can. If you do not have a sharpscontainer, call your pharmacist or health care provider to get one. A special MedGuide will  be given to you by the pharmacist with eachprescription and refill. Be sure to read this information carefully each time. Talk to your health care provider about the use of this medicine in children.Special care may be needed. Overdosage: If you think you have taken too much of this medicine contact apoison control center or emergency room at once. NOTE: This medicine is only for you. Do not share this medicine with others. What if I miss a dose? If you miss a dose, take it as soon as you can unless it is more than 3 days late. If it is more than 3 days late, skip the missed dose. Take the next doseat the normal time. What may interact with this medication? other medicines for diabetes Many medications may cause changes in blood sugar, these include: alcohol containing beverages antiviral medicines for HIV or AIDS aspirin and aspirin-like drugs certain medicines for blood pressure, heart disease, irregular heart beat chromium diuretics female hormones, such as estrogens or progestins, birth control pills fenofibrate gemfibrozil isoniazid lanreotide female hormones or anabolic steroids MAOIs like Carbex, Eldepryl, Marplan, Nardil, and Parnate medicines for allergies, asthma, cold, or cough medicines for depression, anxiety, or psychotic disturbances medicines for weight loss niacin nicotine NSAIDs, medicines for pain and inflammation, like ibuprofen or naproxen octreotide pasireotide pentamidine phenytoin probenecid quinolone antibiotics such as ciprofloxacin, levofloxacin, ofloxacin some herbal dietary supplements steroid medicines such as prednisone or cortisone sulfamethoxazole; trimethoprim thyroid hormones Some medications can hide the warning symptoms of low blood sugar (hypoglycemia). You may need to monitor your blood sugar more closely if youare taking one of these medications. These  include: beta-blockers, often used for high blood pressure or heart problems (examples  include atenolol, metoprolol, propranolol) clonidine guanethidine reserpine This list may not describe all possible interactions. Give your health care provider a list of all the medicines, herbs, non-prescription drugs, or dietary supplements you use. Also tell them if you smoke, drink alcohol, or use illegaldrugs. Some items may interact with your medicine. What should I watch for while using this medication? Visit your health care provider for regular checks on your progress. Check with your health care provider if you have severe diarrhea, nausea, and vomiting, or if you sweat a lot. The loss of too much body fluid may make itdangerous for you to take this medicine. A test called the HbA1C (A1C) will be monitored. This is a simple blood test. It measures your blood sugar control over the last 2 to 3 months. You willreceive this test every 3 to 6 months. Learn how to check your blood sugar. Learn the symptoms of low and high bloodsugar and how to manage them. Always carry a quick-source of sugar with you in case you have symptoms of low blood sugar. Examples include hard sugar candy or glucose tablets. Make sure others know that you can choke if you eat or drink when you develop serious symptoms of low blood sugar, such as seizures or unconsciousness. Get medicalhelp at once. Tell your health care provider if you have high blood sugar. You might need to change the dose of your medicine. If you are sick or exercising more thanusual, you may need to change the dose of your medicine. Do not skip meals. Ask your health care provider if you should avoid alcohol. Many nonprescription cough and cold products contain sugar or alcohol. Thesecan affect blood sugar. Pens should never be shared. Even if the needle is changed, sharing may resultin passing of viruses like hepatitis or HIV. Wear a medical ID bracelet or chain. Carry a card that describes yourcondition. List the medicines and doses you take on the  card. What side effects may I notice from receiving this medication? Side effects that you should report to your doctor or health care professionalas soon as possible: allergic reactions (skin rash, itching or hives; swelling of the face, lips, or tongue) changes in vision diarrhea that continues or is severe infection (fever, chills, cough, sore throat, pain or trouble passing urine) kidney injury (trouble passing urine or change in the amount of urine) low blood sugar (feeling anxious; confusion; dizziness; increased hunger; unusually weak or tired; increased sweating; shakiness; cold, clammy skin; irritable; headache; blurred vision; fast heartbeat; loss of consciousness) lump or swelling on the neck trouble breathing trouble swallowing unusual stomach upset or pain vomiting Side effects that usually do not require medical attention (report to yourdoctor or health care professional if they continue or are bothersome): lack or loss of appetite nausea pain, redness, or irritation at site where injected This list may not describe all possible side effects. Call your doctor for medical advice about side effects. You may report side effects to FDA at1-800-FDA-1088. Where should I keep my medication? Keep out of the reach of children and pets. Refrigeration (preferred): Store unopened pens in a refrigerator between 2 and 8 degrees C (36 and 46 degrees F). Keep it in the original carton until you are ready to take it. Do not freeze or use if the medicine has been frozen. Protect from light. Get rid of any unused medicine after the expiration date on thelabel. Room Temperature: The  pen may be stored at room temperature below 30 degrees C (86 degrees F) for up to a total of 14 days if needed. Protect from light. Avoid exposure to extreme heat. If it is stored at room temperature, throw awayany unused medicine after 14 days or after it expires, whichever is first. To get rid of medicines that are no  longer needed or have expired: Take the medicine to a medicine take-back program. Check with your pharmacy or law enforcement to find a location. If you cannot return the medicine, ask your pharmacist or health care provider how to get rid of this medicine safely. NOTE: This sheet is a summary. It may not cover all possible information. If you have questions about this medicine, talk to your doctor, pharmacist, orhealth care provider.  2022 Elsevier/Gold Standard (2019-11-24 07:35:51)

## 2020-09-22 ENCOUNTER — Other Ambulatory Visit: Payer: Self-pay | Admitting: Family Medicine

## 2020-09-22 DIAGNOSIS — E1140 Type 2 diabetes mellitus with diabetic neuropathy, unspecified: Secondary | ICD-10-CM

## 2020-09-27 ENCOUNTER — Telehealth: Payer: Self-pay | Admitting: *Deleted

## 2020-09-27 NOTE — Telephone Encounter (Signed)
Copied from [place] [phone]. Topic: Quick Communication - Rx Refill/Question >> Sep 24, 2020  2:05 PM Pawlus, Brayton Layman A wrote: Pt called in stating her Rx Dulaglutide (TRULICITY) [id number] SOPN will cost over $700 which the pt cannot afford, please advise if there is a different option or discount available.

## 2020-09-28 NOTE — Telephone Encounter (Signed)
Can you please check to see what other options are available?  Thank you

## 2020-09-28 NOTE — Telephone Encounter (Signed)
Routing to PCP for review.

## 2020-09-29 NOTE — Telephone Encounter (Signed)
Patient called in to inform Dr Margarita Rana that she still does not have $700+ to get her medication at the pharmacy. She will call her insurance and find out what similar medication they will cover. Ph# [phone]

## 2020-10-01 ENCOUNTER — Telehealth: Payer: Self-pay

## 2020-10-01 ENCOUNTER — Other Ambulatory Visit: Payer: Self-pay

## 2020-10-01 NOTE — Telephone Encounter (Signed)
Patient's insurance has a preferred pharmacy, but the numbers available to me for assistance were not able to provide me with that information.  I was advised to have the patient call the 800# on the back of her card, they can tell her who her preferred pharmacy is.  If the prescription is transferred to the preferred pharmacy her copay may reduce to an affordable amount.  I did run a test claim through for Victoza and this claim went through for $0 at this time.  This may be because it is the first fill, subsequent fills may have to be filled at preferred pharmacy as well.

## 2020-10-14 ENCOUNTER — Telehealth: Payer: Self-pay | Admitting: Family Medicine

## 2020-10-14 NOTE — Telephone Encounter (Signed)
Copied from [place] [id number]. Topic: General - Other >> Oct 13, 2020  3:53 PM Yvette Rack wrote: Reason for CRM: Pt stated her insurance will not cover the Dulaglutide (TRULICITY) [id number] SOPN and she can not afford to pay $800. Pt would like to know if there are any other medications that can be prescribed. Pt stated her insurance did not provide a list of alternative medications that are covered

## 2020-10-14 NOTE — Telephone Encounter (Signed)
We do not have access to the patient's preferred pharmacy. She will need to call her insurance to find out what pharmacy is preferred for lower copays.

## 2020-10-26 ENCOUNTER — Ambulatory Visit: Payer: BC Managed Care – PPO | Admitting: Adult Health

## 2020-11-01 ENCOUNTER — Ambulatory Visit: Payer: BC Managed Care – PPO | Attending: Family Medicine | Admitting: Family Medicine

## 2020-11-01 ENCOUNTER — Encounter: Payer: Self-pay | Admitting: Family Medicine

## 2020-11-01 ENCOUNTER — Other Ambulatory Visit: Payer: Self-pay

## 2020-11-01 VITALS — BP 146/73 | HR 86 | Ht 72.0 in | Wt 234.0 lb

## 2020-11-01 DIAGNOSIS — I10 Essential (primary) hypertension: Secondary | ICD-10-CM | POA: Diagnosis not present

## 2020-11-01 DIAGNOSIS — E1169 Type 2 diabetes mellitus with other specified complication: Secondary | ICD-10-CM

## 2020-11-01 DIAGNOSIS — Z1211 Encounter for screening for malignant neoplasm of colon: Secondary | ICD-10-CM

## 2020-11-01 DIAGNOSIS — G8929 Other chronic pain: Secondary | ICD-10-CM

## 2020-11-01 DIAGNOSIS — M5441 Lumbago with sciatica, right side: Secondary | ICD-10-CM | POA: Diagnosis not present

## 2020-11-01 DIAGNOSIS — E1140 Type 2 diabetes mellitus with diabetic neuropathy, unspecified: Secondary | ICD-10-CM

## 2020-11-01 DIAGNOSIS — Z23 Encounter for immunization: Secondary | ICD-10-CM

## 2020-11-01 DIAGNOSIS — E785 Hyperlipidemia, unspecified: Secondary | ICD-10-CM

## 2020-11-01 LAB — GLUCOSE, POCT (MANUAL RESULT ENTRY): POC Glucose: 83 mg/dl (ref 70–99)

## 2020-11-01 LAB — POCT GLYCOSYLATED HEMOGLOBIN (HGB A1C): HbA1c, POC (controlled diabetic range): 7.8 % — AB (ref 0.0–7.0)

## 2020-11-01 MED ORDER — HYDROCHLOROTHIAZIDE 25 MG PO TABS: 25.0000 mg | ORAL_TABLET | Freq: Every day | ORAL | 1 refills | Status: DC

## 2020-11-01 MED ORDER — GLIPIZIDE 10 MG PO TABS
10.0000 mg | ORAL_TABLET | Freq: Two times a day (BID) | ORAL | 4 refills | Status: DC
Start: 2020-11-01 — End: 2021-03-29

## 2020-11-01 MED ORDER — ASPIRIN EC 81 MG PO TBEC: 81.0000 mg | DELAYED_RELEASE_TABLET | Freq: Every day | ORAL | 11 refills | Status: AC

## 2020-11-01 MED ORDER — DULOXETINE HCL 60 MG PO CPEP: 60.0000 mg | ORAL_CAPSULE | Freq: Every day | ORAL | 3 refills | Status: DC

## 2020-11-01 MED ORDER — GABAPENTIN 300 MG PO CAPS: 600.0000 mg | ORAL_CAPSULE | Freq: Three times a day (TID) | ORAL | 6 refills | Status: DC

## 2020-11-01 MED ORDER — ATORVASTATIN CALCIUM 20 MG PO TABS: 20.0000 mg | ORAL_TABLET | Freq: Every day | ORAL | 6 refills | Status: DC

## 2020-11-01 MED ORDER — METHOCARBAMOL 500 MG PO TABS: 1000.0000 mg | ORAL_TABLET | Freq: Two times a day (BID) | ORAL | 1 refills | Status: DC | PRN

## 2020-11-01 NOTE — Progress Notes (Signed)
Subjective:  Patient ID: LILLIEANN PAVLICH, female    DOB: 01/07/1956  Age: 65 y.o. MRN: [id number]  CC: Diabetes   HPI PROMISE WELDIN is a 65 y.o. year old female with a history of Type 2 Diabetes Mellitus (A1c 8.6), Diabetic neuropathy, Hyperlipidemia, Hypertension, Sciatica here for a follow-up visit  Interval History: A1c 7.8 down from 8.6 previously. She stopped Metformin when she started Trulicity but she has not been able to obtain the coupon for Trulicity hence she had to revert back to metformin due to the cost of Trulicity.  Since restarting metformin she has noticed diarrhea. She continues to take her [place] Glade and glipizide.  The tips of her fingers feel numb and she is already on gabapentin 600 mg 3 times daily for this.  When she increases her dose to 3 gabapentin tablets as opposed to 2 she does find relief. Compliant with her statin and antihypertensive. She does have intermittent flares of her sciatica.  The superior aspect of her back has been extremely sensitive for the last couple of days but she denies presence of a rash there. Past Medical History:  Diagnosis Date   Asthma    as a child   Diabetes mellitus    Hypercholesterolemia    Hypertension    Stroke ([place])    Ventral hernia    Wears glasses     Past Surgical History:  Procedure Laterality Date   ABDOMINAL HYSTERECTOMY     KNEE SURGERY     LEG SURGERY     left lower extremity   TUBAL LIGATION     VENTRAL HERNIA REPAIR N/A 05/29/2019   Procedure: REPAIR OF VENTRAL HERNIA WITH MESH;  Surgeon: Erroll Luna, MD;  Location: [facility];  Service: General;  Laterality: N/A;    Family History  Problem Relation Age of Onset   Diabetes Mother    Hypertension Mother    Stroke Mother    Heart disease Brother    Breast cancer Sister 60    Allergies  Allergen Reactions   Penicillins Rash    Did it involve swelling of the face/tongue/throat, SOB, or low BP? {[id number] Did it involve sudden or severe  rash/hives, skin peeling, or any reaction on the inside of your mouth or nose? Y Did you need to seek medical attention at a hospital or doctor's office? Y When did it last happen?  Several years ago     If all above answers are "NO", may proceed with cephalosporin use.     Outpatient Medications Prior to Visit  Medication Sig Dispense Refill   Blood Glucose Monitoring Suppl (CONTOUR NEXT MONITOR) w/Device KIT 1 each by Does not apply route 3 (three) times daily as needed. 1 kit 0   FARXIGA 10 MG TABS tablet TAKE 1 TABLET BY MOUTH ONCE DAILY BEFORE BREAKFAST 90 tablet 1   glucose blood (CONTOUR NEXT TEST) test strip Use as instructed 100 each 12   ibuprofen (ADVIL) 600 MG tablet Take 1 tablet (600 mg total) by mouth 3 (three) times daily. With food X 7 days then prn pain 60 tablet 2   Lancets (ACCU-CHEK SOFT TOUCH) lancets Use as instructed 100 each 12   metFORMIN (GLUCOPHAGE) 1000 MG tablet Take 1 tablet (1,000 mg total) by mouth 2 (two) times daily with a meal. 60 tablet 6   aspirin EC 81 MG tablet Take 1 tablet (81 mg total) by mouth daily. Swallow whole. 30 tablet 11   atorvastatin (LIPITOR) 20 MG tablet  Take 1 tablet (20 mg total) by mouth daily. 30 tablet 6   gabapentin (NEURONTIN) 300 MG capsule Take 2 capsules (600 mg total) by mouth 3 (three) times daily. 180 capsule 6   glipiZIDE (GLUCOTROL) 10 MG tablet TAKE 1 TABLET BY MOUTH TWICE DAILY BEFORE  A  MEAL. 60 tablet 4   hydrochlorothiazide (HYDRODIURIL) 25 MG tablet Take 1 tablet by mouth once daily 90 tablet 1   methocarbamol (ROBAXIN) 500 MG tablet Take 2 tablets (1,000 mg total) by mouth 2 (two) times daily as needed for muscle spasms. X 7 days then prn muscle spasm 120 tablet 1   Dulaglutide (TRULICITY) 9.37 JI/9.6VE SOPN Inject 0.75 mg into the skin once a week. (Patient not taking: Reported on 11/01/2020) 2 mL 6   HYDROcodone-acetaminophen (NORCO/VICODIN) 5-325 MG tablet Take 1 tablet by mouth every 6 (six) hours as needed for  moderate pain. (Patient not taking: Reported on 11/01/2020) 15 tablet 0   No facility-administered medications prior to visit.     ROS Review of Systems  Constitutional:  Negative for activity change, appetite change and fatigue.  HENT:  Negative for congestion, sinus pressure and sore throat.   Eyes:  Negative for visual disturbance.  Respiratory:  Negative for cough, chest tightness, shortness of breath and wheezing.   Cardiovascular:  Negative for chest pain and palpitations.  Gastrointestinal:  Negative for abdominal distention, abdominal pain and constipation.  Endocrine: Negative for polydipsia.  Genitourinary:  Negative for dysuria and frequency.  Musculoskeletal:  Positive for back pain. Negative for arthralgias.  Skin:  Negative for rash.  Neurological:  Positive for numbness. Negative for tremors and light-headedness.  Hematological:  Does not bruise/bleed easily.  Psychiatric/Behavioral:  Negative for agitation and behavioral problems.    Objective:  BP (!) 146/73   Pulse 86   Ht 6' (1.829 m)   Wt 234 lb (106.1 kg)   SpO2 98%   BMI 31.74 kg/m   BP/Weight 11/01/2020 07/28/2020 9/38/1017  Systolic BP 510 258 527  Diastolic BP 73 66 83  Wt. (Lbs) 234 232 -  BMI 31.74 31.46 -      Physical Exam Constitutional:      Appearance: She is well-developed.  Cardiovascular:     Rate and Rhythm: Normal rate.     Heart sounds: Normal heart sounds. No murmur heard. Pulmonary:     Effort: Pulmonary effort is normal.     Breath sounds: Normal breath sounds. No wheezing or rales.  Chest:     Chest wall: No tenderness.  Abdominal:     General: Bowel sounds are normal. There is no distension.     Palpations: Abdomen is soft. There is no mass.     Tenderness: There is no abdominal tenderness.  Musculoskeletal:        General: Normal range of motion.     Right lower leg: No edema.     Left lower leg: No edema.  Neurological:     Mental Status: She is alert and oriented  to person, place, and time.     Comments: Hypersensitivity of nape of neck.  Absence of rash.  Psychiatric:        Mood and Affect: Mood normal.    CMP Latest Ref Rng & Units 06/23/2020 01/23/2020 01/22/2020  Glucose 70 - 99 mg/dL 163(H) 187(H) 228(H)  BUN 8 - 23 mg/dL 11 20 27(H)  Creatinine 0.44 - 1.00 mg/dL 1.04(H) 1.15(H) 2.00(H)  Sodium 135 - 145 mmol/L 138 141 139  Potassium 3.5 - 5.1 mmol/L 4.0 3.5 3.5  Chloride 98 - 111 mmol/L 102 100 100  CO2 22 - 32 mmol/L 26 29 -  Calcium 8.9 - 10.3 mg/dL 9.4 9.3 -  Total Protein 6.5 - 8.1 g/dL - - -  Total Bilirubin 0.3 - 1.2 mg/dL - - -  Alkaline Phos 38 - 126 U/L - - -  AST 15 - 41 U/L - - -  ALT 0 - 44 U/L - - -    Lipid Panel     Component Value Date/Time   CHOL 168 01/23/2020 0212   CHOL 186 09/03/2018 1359   TRIG 303 (H) 01/23/2020 0212   HDL 37 (L) 01/23/2020 0212   HDL 48 09/03/2018 1359   CHOLHDL 4.5 01/23/2020 0212   VLDL 61 (H) 01/23/2020 0212   LDLCALC 70 01/23/2020 0212   LDLCALC 94 09/03/2018 1359    CBC    Component Value Date/Time   WBC 6.1 06/23/2020 1334   RBC 4.77 06/23/2020 1334   HGB 12.2 06/23/2020 1334   HCT 40.3 06/23/2020 1334   PLT 370 06/23/2020 1334   MCV 84.5 06/23/2020 1334   MCH 25.6 (L) 06/23/2020 1334   MCHC 30.3 06/23/2020 1334   RDW 14.7 06/23/2020 1334   LYMPHSABS 3.2 01/22/2020 1057   MONOABS 0.7 01/22/2020 1057   EOSABS 0.2 01/22/2020 1057   BASOSABS 0.1 01/22/2020 1057    Lab Results  Component Value Date   HGBA1C 7.8 (A) 11/01/2020    Assessment & Plan:  1. Type 2 diabetes mellitus with other specified complication, without long-term current use of insulin (HCC) Slightly above goal with A1c of 7.8; goal is less than 7.0 Clinical pharmacist called in to see patient to assist with coupon card for Trulicity She will continue with metformin for now until she obtains Trulicity and if she is unable to obtain Trulicity we will need to explore other options Counseled on  Diabetic diet, my plate method, 734 minutes of moderate intensity exercise/week Blood sugar logs with fasting goals of 80-120 mg/dl, random of less than 180 and in the event of sugars less than 60 mg/dl or greater than 400 mg/dl encouraged to notify the clinic. Advised on the need for annual eye exams, annual foot exams, Pneumonia vaccine. - POCT glucose (manual entry) - POCT glycosylated hemoglobin (Hb A1C) - aspirin EC 81 MG tablet; Take 1 tablet (81 mg total) by mouth daily. Swallow whole.  Dispense: 30 tablet; Refill: 11 - Microalbumin / creatinine urine ratio  2. Type 2 diabetes mellitus with diabetic neuropathy, without long-term current use of insulin (HCC) Neuropathy is uncontrolled Cymbalta added to regimen She does have extreme sensitivity at the nape of her neck with low suspicion for shingles Advised to apply Lidoderm patch to nape of neck/upper back Consider switching to Lyrica if neuropathy is still uncontrolled - DULoxetine (CYMBALTA) 60 MG capsule; Take 1 capsule (60 mg total) by mouth daily.  Dispense: 30 capsule; Refill: 3 - glipiZIDE (GLUCOTROL) 10 MG tablet; Take 1 tablet (10 mg total) by mouth 2 (two) times daily before a meal.  Dispense: 60 tablet; Refill: 4 - gabapentin (NEURONTIN) 300 MG capsule; Take 2 capsules (600 mg total) by mouth 3 (three) times daily.  Dispense: 180 capsule; Refill: 6  3. Screening for colon cancer - Cologuard  4. Hyperlipidemia associated with type 2 diabetes mellitus (HCC) Stable Low-cholesterol diet - atorvastatin (LIPITOR) 20 MG tablet; Take 1 tablet (20 mg total) by mouth daily.  Dispense:  30 tablet; Refill: 6  5. Essential hypertension, benign Slightly above goal Consider switching HCTZ to lisinopril/HCTZ if still above goal Counseled on blood pressure goal of less than 130/80, low-sodium, DASH diet, medication compliance, 150 minutes of moderate intensity exercise per week. Discussed medication compliance, adverse effects.  -  hydrochlorothiazide (HYDRODIURIL) 25 MG tablet; Take 1 tablet (25 mg total) by mouth daily.  Dispense: 90 tablet; Refill: 1  6. Chronic bilateral low back pain with right-sided sciatica Stable with intermittent flares Cymbalta will be beneficial Advised to apply heat or ice whichever is tolerated to painful areas. Counseled on evidence of improvement in pain control with regards to yoga, water aerobics, massage, home physical therapy, exercise as tolerated. - methocarbamol (ROBAXIN) 500 MG tablet; Take 2 tablets (1,000 mg total) by mouth 2 (two) times daily as needed for muscle spasms.  Dispense: 120 tablet; Refill: 1  7. Need for immunization against influenza - Flu Vaccine QUAD 81moIM (Fluarix, Fluzone & Alfiuria Quad PF)   Meds ordered this encounter  Medications   DULoxetine (CYMBALTA) 60 MG capsule    Sig: Take 1 capsule (60 mg total) by mouth daily.    Dispense:  30 capsule    Refill:  3    For back pain and neuropathy   glipiZIDE (GLUCOTROL) 10 MG tablet    Sig: Take 1 tablet (10 mg total) by mouth 2 (two) times daily before a meal.    Dispense:  60 tablet    Refill:  4   aspirin EC 81 MG tablet    Sig: Take 1 tablet (81 mg total) by mouth daily. Swallow whole.    Dispense:  30 tablet    Refill:  11   gabapentin (NEURONTIN) 300 MG capsule    Sig: Take 2 capsules (600 mg total) by mouth 3 (three) times daily.    Dispense:  180 capsule    Refill:  6    Enough until appt   atorvastatin (LIPITOR) 20 MG tablet    Sig: Take 1 tablet (20 mg total) by mouth daily.    Dispense:  30 tablet    Refill:  6   hydrochlorothiazide (HYDRODIURIL) 25 MG tablet    Sig: Take 1 tablet (25 mg total) by mouth daily.    Dispense:  90 tablet    Refill:  1   methocarbamol (ROBAXIN) 500 MG tablet    Sig: Take 2 tablets (1,000 mg total) by mouth 2 (two) times daily as needed for muscle spasms.    Dispense:  120 tablet    Refill:  1    Follow-up: Return in about 3 months (around 01/31/2021)  for Medical conditions.       ECharlott Rakes MD, FAAFP. CCats[facility] Centerand W[facility]  11/01/2020, 6:03 PM

## 2020-11-01 NOTE — Patient Instructions (Signed)
Diabetic Neuropathy Diabetic neuropathy refers to nerve damage that is caused by diabetes. Over time, people with diabetes can develop nerve damage throughout the body. There are several types of diabetic neuropathy: Peripheral neuropathy. This is the most common type of diabetic neuropathy. It damages the nerves that carry signals between the spinal cord and other parts of the body (peripheral nerves). This usually affects nerves in the feet, legs, hands, and arms. Autonomic neuropathy. This type causes damage to nerves that control involuntary functions (autonomic nerves). Involuntary functions are functions of the body that you do not control. They include heartbeat, body temperature, blood pressure, urination, digestion, sweating, sexual function, or response to changes in blood glucose. Focal neuropathy. This type of nerve damage affects one area of the body, such as an arm, a leg, or the face. The injury may involve one nerve or a small group of nerves. Focal neuropathy can be painful and unpredictable. It occurs most often in older adults with diabetes. This often develops suddenly, but usually improves over time and does not cause long-term problems. Proximal neuropathy. This type of nerve damage affects the nerves of the thighs, hips, buttocks, or legs. It causes severe pain, weakness, and muscle death (atrophy), usually in the thigh muscles. It is more common among older men and people who have type 2 diabetes. The length of recovery time may vary. What are the causes? Peripheral, autonomic, and focal neuropathies are caused by diabetes that is not well controlled with treatment. The cause of proximal neuropathy is not known, but it may be caused by inflammation related to uncontrolled blood glucose levels. What are the signs or symptoms? Peripheral neuropathy Peripheral neuropathy develops slowly over time. When the nerves of the feet and legs no longer work, you may experience: Burning,  stabbing, or aching pain in the legs or feet. Pain or cramping in the legs or feet. Loss of feeling (numbness) and inability to feel pressure or pain in the feet. This can lead to: Thick calluses or sores on areas of constant pressure. Ulcers. Reduced ability to feel temperature changes. Foot deformities. Muscle weakness. Loss of balance or coordination. Autonomic neuropathy The symptoms of autonomic neuropathy vary depending on which nerves are affected. Symptoms may include: Problems with digestion, such as: Nausea or vomiting. Poor appetite. Bloating. Diarrhea or constipation. Trouble swallowing. Losing weight without trying to. Problems with the heart, blood, and lungs, such as: Dizziness, especially when standing up. Fainting. Shortness of breath. Irregular heartbeat. Bladder problems, such as: Trouble starting or stopping urination. Leaking urine. Trouble emptying the bladder. Urinary tract infections (UTIs). Problems with other body functions, such as: Sweat. You may sweat too much or too little. Temperature. You might get hot easily. Or, you might feel cold more than usual. Sexual function. Men may not be able to get or maintain an erection. Women may have vaginal dryness and difficulty with arousal. Focal neuropathy Symptoms affect only one area of the body. Common symptoms include: Numbness. Tingling. Burning pain. Prickling feeling. Very sensitive skin. Weakness. Inability to move (paralysis). Muscle twitching. Muscles getting smaller (wasting). Poor coordination. Double or blurred vision. Proximal neuropathy Sudden, severe pain in the hip, thigh, or buttocks. Pain may spread from the back into the legs (sciatica). Pain and numbness in the arms and legs. Tingling. Loss of bladder control or bowel control. Weakness and wasting of thigh muscles. Difficulty getting up from a seated position. Abdominal swelling. Unexplained weight loss. How is this  diagnosed? Diagnosis varies depending on the type   of neuropathy your health care provider suspects. Peripheral neuropathy Your health care provider will do a neurologic exam. This exam checks your reflexes, how you move, and what you can feel. You may have other tests, such as: Blood tests. Tests of the fluid that surrounds the spinal cord (lumbar puncture). CT scan. MRI. Checking the nerves that control muscles (electromyogram, or EMG). Checking how quickly signals pass through your nerves (nerve conduction study). Checking a small piece of a nerve using a microscope (biopsy). Autonomic neuropathy You may have tests, such as: Tests to measure your blood pressure and heart rate. You may be secured to an exam table that moves you from a lying position to an upright position (table tilt test). Breathing tests to check your lungs. Tests to check how food moves through the digestive system (gastric emptying tests). Blood, sweat, or urine tests. Ultrasound of your bladder. Spinal fluid tests. Focal neuropathy This condition may be diagnosed with: A neurologic exam. CT scan. MRI. EMG. Nerve conduction study. Proximal neuropathy There is no test to diagnose this type of neuropathy. You may have tests to rule out other possible causes of this type of neuropathy. Tests may include: X-rays of your spine and lumbar region. Lumbar puncture. MRI. How is this treated? The goal of treatment is to keep nerve damage from getting worse. Treatment may include: Following your diabetes management plan. This will help keep your blood glucose level and your A1C level within your target range. This is the most important treatment. Using prescription pain medicine. Follow these instructions at home: Diabetes management Follow your diabetes management plan as told by your health care provider. Check your blood glucose levels. Keep your blood glucose in your target range. Have your A1C level checked at  least two times a year, or as often as told. Take over the counter and prescription medicines only as told by your health care provider. This includes insulin and diabetes medicine.  Lifestyle  Do not use any products that contain nicotine or tobacco, such as cigarettes, e-cigarettes, and chewing tobacco. If you need help quitting, ask your health care provider. Be physically active every day. Include strength training and balance exercises. Follow a healthy meal plan. Work with your health care provider to manage your blood pressure. General instructions Ask your health care provider if the medicine prescribed to you requires you to avoid driving or using machinery. Check your skin and feet every day for cuts, bruises, redness, blisters, or sores. Keep all follow-up visits. This is important. Contact a health care provider if: You have burning, stabbing, or aching pain in your legs or feet. You are unable to feel pressure or pain in your feet. You develop problems with digestion, such as: Nausea. Vomiting. Bloating. Constipation. Diarrhea. Abdominal pain. You have difficulty with urination, such as: Inability to control when you urinate (incontinence). Inability to completely empty the bladder (retention). You feel as if your heart is racing (palpitations). You feel dizzy, weak, or faint when you stand up. Get help right away if: You cannot urinate. You have sudden weakness or loss of coordination. You have trouble speaking. You have pain or pressure in your chest. You have an irregular heartbeat. You have sudden inability to move a part of your body. These symptoms may represent a serious problem that is an emergency. Do not wait to see if the symptoms will go away. Get medical help right away. Call your local emergency services (911 in the [place] Do not drive yourself to   the hospital. Summary Diabetic neuropathy is nerve damage that is caused by diabetes. It can cause numbness  and pain in the arms, legs, digestive tract, heart, and other body systems. This condition is treated by keeping your blood glucose level and your A1C level within your target range. This can help prevent neuropathy from getting worse. Check your skin and feet every day for cuts, bruises, redness, blisters, or sores. Do not use any products that contain nicotine or tobacco, such as cigarettes, e-cigarettes, and chewing tobacco. This information is not intended to replace advice given to you by your health care provider. Make sure you discuss any questions you have with your health care provider. Document Revised: 06/05/2019 Document Reviewed: 06/05/2019 [employer] Patient Education  2022 [employer]  

## 2020-11-01 NOTE — Progress Notes (Signed)
Has pain radiating across lower back.  Medication refills.  Metformin causing diarrhea.

## 2020-11-02 LAB — MICROALBUMIN / CREATININE URINE RATIO
Creatinine, Urine: 94.4 mg/dL
Microalb/Creat Ratio: <3 mg/g creat (ref 0–29)
Microalbumin, Urine: <3 ug/mL

## 2020-11-05 ENCOUNTER — Telehealth: Payer: Self-pay

## 2020-11-05 NOTE — Telephone Encounter (Signed)
-----   Message from [name], MD sent at 11/03/2020  5:11 PM EDT ----- Please inform her that her urine test is normal

## 2020-11-05 NOTE — Telephone Encounter (Signed)
Patient has viewed results via mychart

## 2020-12-10 ENCOUNTER — Other Ambulatory Visit: Payer: Self-pay

## 2020-12-10 ENCOUNTER — Emergency Department (HOSPITAL_COMMUNITY): Payer: BC Managed Care – PPO

## 2020-12-10 ENCOUNTER — Emergency Department (HOSPITAL_COMMUNITY)
Admission: EM | Admit: 2020-12-10 | Discharge: 2020-12-10 | Disposition: A | Discharge status: Home / Self Care | Payer: BC Managed Care – PPO | Attending: Emergency Medicine | Admitting: Emergency Medicine

## 2020-12-10 DIAGNOSIS — R0902 Hypoxemia: Secondary | ICD-10-CM | POA: Diagnosis not present

## 2020-12-10 DIAGNOSIS — Z7984 Long term (current) use of oral hypoglycemic drugs: Secondary | ICD-10-CM | POA: Insufficient documentation

## 2020-12-10 DIAGNOSIS — R410 Disorientation, unspecified: Secondary | ICD-10-CM | POA: Diagnosis not present

## 2020-12-10 DIAGNOSIS — R079 Chest pain, unspecified: Secondary | ICD-10-CM | POA: Insufficient documentation

## 2020-12-10 DIAGNOSIS — J45909 Unspecified asthma, uncomplicated: Secondary | ICD-10-CM | POA: Diagnosis not present

## 2020-12-10 DIAGNOSIS — R0602 Shortness of breath: Secondary | ICD-10-CM | POA: Insufficient documentation

## 2020-12-10 DIAGNOSIS — E119 Type 2 diabetes mellitus without complications: Secondary | ICD-10-CM | POA: Insufficient documentation

## 2020-12-10 DIAGNOSIS — R5381 Other malaise: Secondary | ICD-10-CM | POA: Diagnosis not present

## 2020-12-10 DIAGNOSIS — I10 Essential (primary) hypertension: Secondary | ICD-10-CM | POA: Diagnosis not present

## 2020-12-10 DIAGNOSIS — R0789 Other chest pain: Secondary | ICD-10-CM | POA: Diagnosis not present

## 2020-12-10 DIAGNOSIS — R002 Palpitations: Secondary | ICD-10-CM | POA: Diagnosis not present

## 2020-12-10 DIAGNOSIS — R5383 Other fatigue: Secondary | ICD-10-CM | POA: Insufficient documentation

## 2020-12-10 DIAGNOSIS — Z7982 Long term (current) use of aspirin: Secondary | ICD-10-CM | POA: Insufficient documentation

## 2020-12-10 DIAGNOSIS — R55 Syncope and collapse: Secondary | ICD-10-CM | POA: Insufficient documentation

## 2020-12-10 DIAGNOSIS — R42 Dizziness and giddiness: Secondary | ICD-10-CM | POA: Diagnosis not present

## 2020-12-10 DIAGNOSIS — Z87891 Personal history of nicotine dependence: Secondary | ICD-10-CM | POA: Insufficient documentation

## 2020-12-10 DIAGNOSIS — R110 Nausea: Secondary | ICD-10-CM | POA: Insufficient documentation

## 2020-12-10 DIAGNOSIS — R519 Headache, unspecified: Secondary | ICD-10-CM | POA: Diagnosis not present

## 2020-12-10 LAB — DIFFERENTIAL
Abs Immature Granulocytes: 0.01 10*3/uL (ref 0.00–0.07)
Basophils Absolute: 0.1 10*3/uL (ref 0.0–0.1)
Basophils Relative: 1 %
Eosinophils Absolute: 0.2 10*3/uL (ref 0.0–0.5)
Eosinophils Relative: 5 %
Immature Granulocytes: 0 %
Lymphocytes Relative: 52 %
Lymphs Abs: 2.4 10*3/uL (ref 0.7–4.0)
Monocytes Absolute: 0.4 10*3/uL (ref 0.1–1.0)
Monocytes Relative: 7 %
Neutro Abs: 1.7 10*3/uL (ref 1.7–7.7)
Neutrophils Relative %: 35 %

## 2020-12-10 LAB — CBC
HCT: 40.4 % (ref 36.0–46.0)
Hemoglobin: 12.8 g/dL (ref 12.0–15.0)
MCH: 26.7 pg (ref 26.0–34.0)
MCHC: 31.7 g/dL (ref 30.0–36.0)
MCV: 84.2 fL (ref 80.0–100.0)
Platelets: 360 K/uL (ref 150–400)
RBC: 4.8 MIL/uL (ref 3.87–5.11)
RDW: 14.7 % (ref 11.5–15.5)
WBC: 4.8 K/uL (ref 4.0–10.5)
nRBC: 0 % (ref 0.0–0.2)

## 2020-12-10 LAB — COMPREHENSIVE METABOLIC PANEL WITH GFR
ALT: 26 U/L (ref 0–44)
AST: 26 U/L (ref 15–41)
Albumin: 3.7 g/dL (ref 3.5–5.0)
Alkaline Phosphatase: 114 U/L (ref 38–126)
Anion gap: 7 (ref 5–15)
BUN: 17 mg/dL (ref 8–23)
CO2: 25 mmol/L (ref 22–32)
Calcium: 9 mg/dL (ref 8.9–10.3)
Chloride: 108 mmol/L (ref 98–111)
Creatinine, Ser: 0.96 mg/dL (ref 0.44–1.00)
GFR, Estimated: >60 mL/min (ref >60)
Glucose, Bld: 98 mg/dL (ref 70–99)
Potassium: 4 mmol/L (ref 3.5–5.1)
Sodium: 140 mmol/L (ref 135–145)
Total Bilirubin: 0.2 mg/dL — ABNORMAL LOW (ref 0.3–1.2)
Total Protein: 7.2 g/dL (ref 6.5–8.1)

## 2020-12-10 LAB — APTT: aPTT: 24 s (ref 24–36)

## 2020-12-10 LAB — PROTIME-INR
INR: 1 (ref 0.8–1.2)
Prothrombin Time: 12.8 s (ref 11.4–15.2)

## 2020-12-10 LAB — MAGNESIUM: Magnesium: 2.3 mg/dL (ref 1.7–2.4)

## 2020-12-10 LAB — TROPONIN I (HIGH SENSITIVITY)
Troponin I (High Sensitivity): 6 ng/L (ref <18)
Troponin I (High Sensitivity): 6 ng/L (ref <18)

## 2020-12-10 LAB — BRAIN NATRIURETIC PEPTIDE: B Natriuretic Peptide: 9.2 pg/mL (ref 0.0–100.0)

## 2020-12-10 LAB — CBG MONITORING, ED: Glucose-Capillary: 122 mg/dL — ABNORMAL HIGH (ref 70–99)

## 2020-12-10 IMAGING — CR DG CHEST 2V
2 series · 2 of 2 positions shown · non-contrast
Comparison: [DATE]

CLINICAL DATA: Chest pain, shortness of breath, and syncope.

EXAM:
CHEST - 2 VIEW

[chest lat]
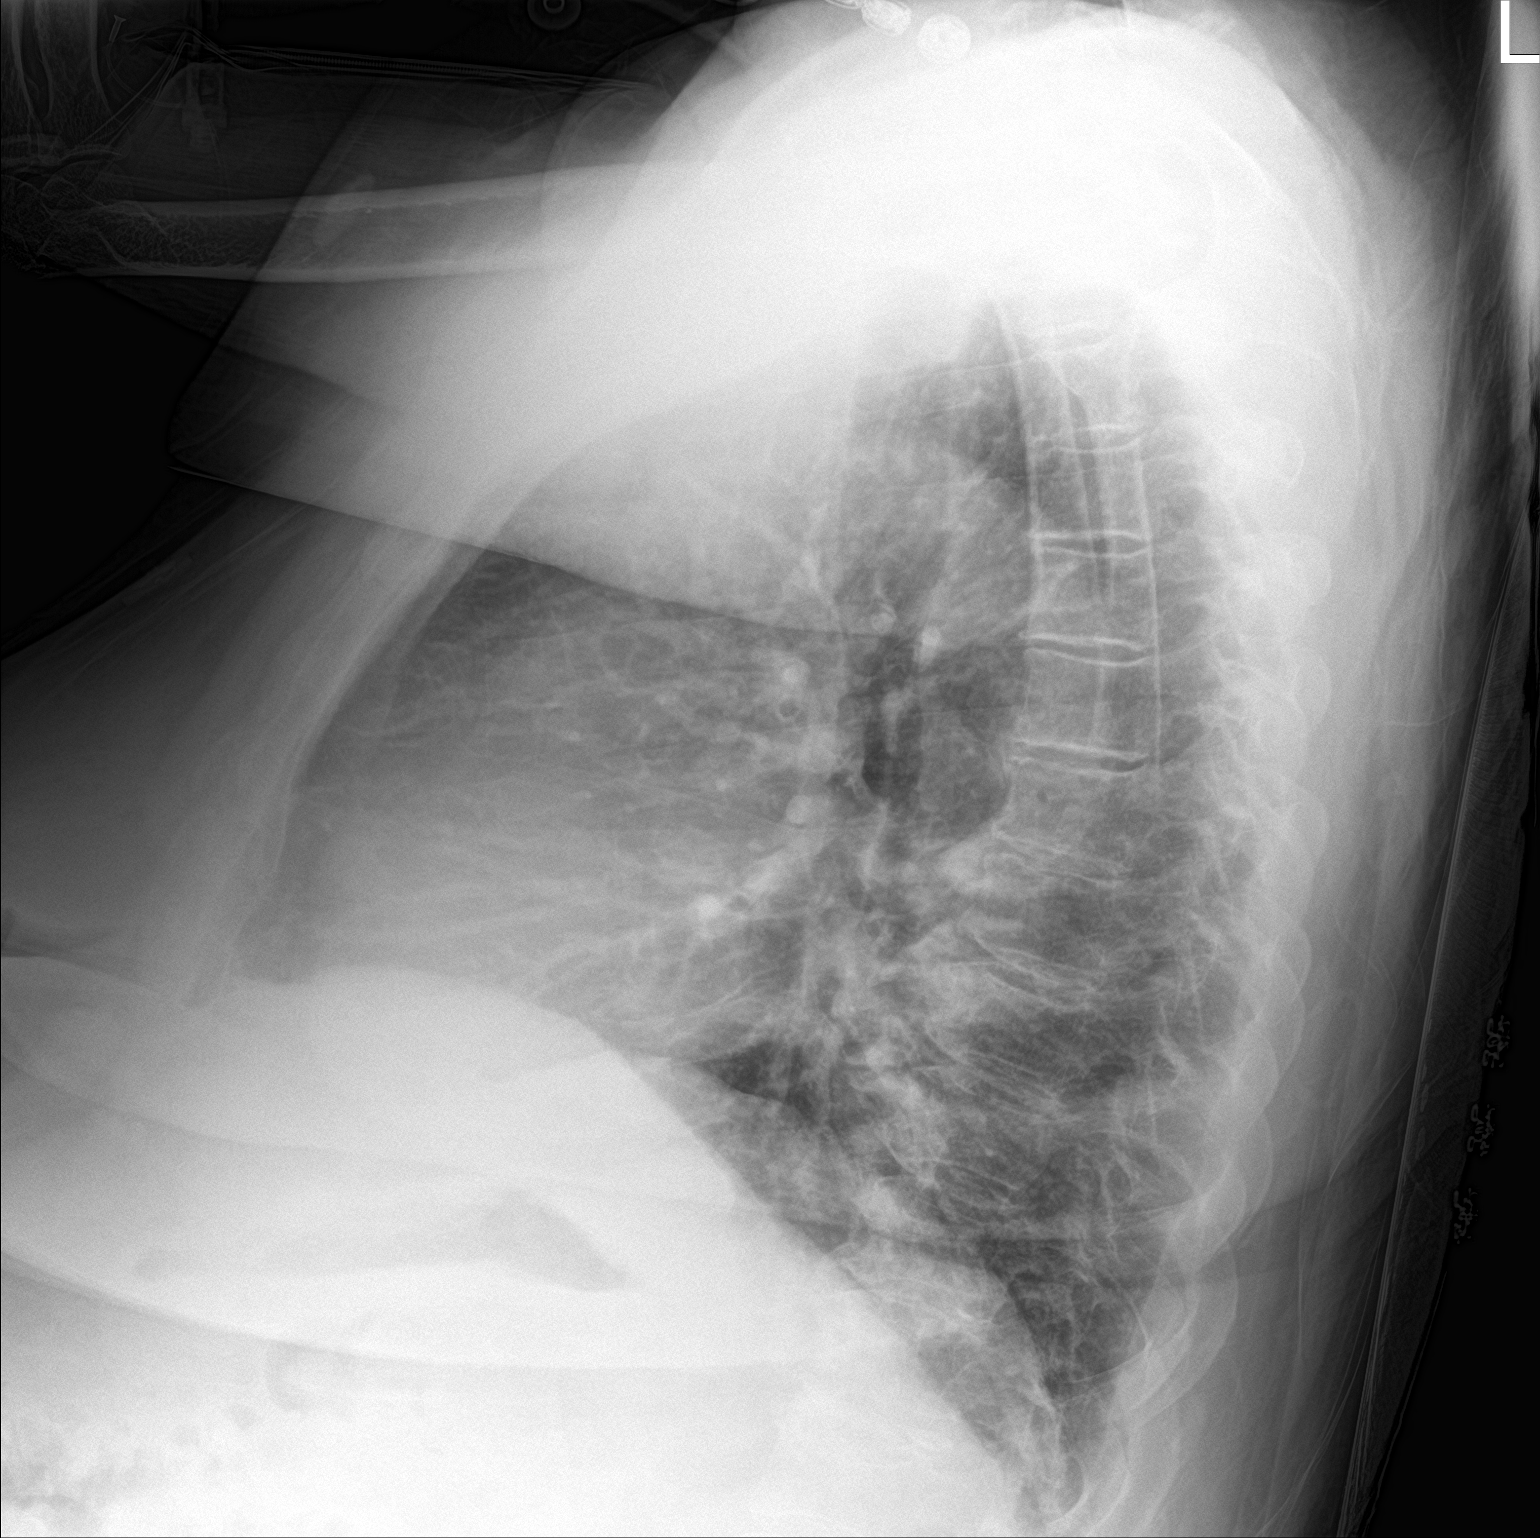

[chest ap]
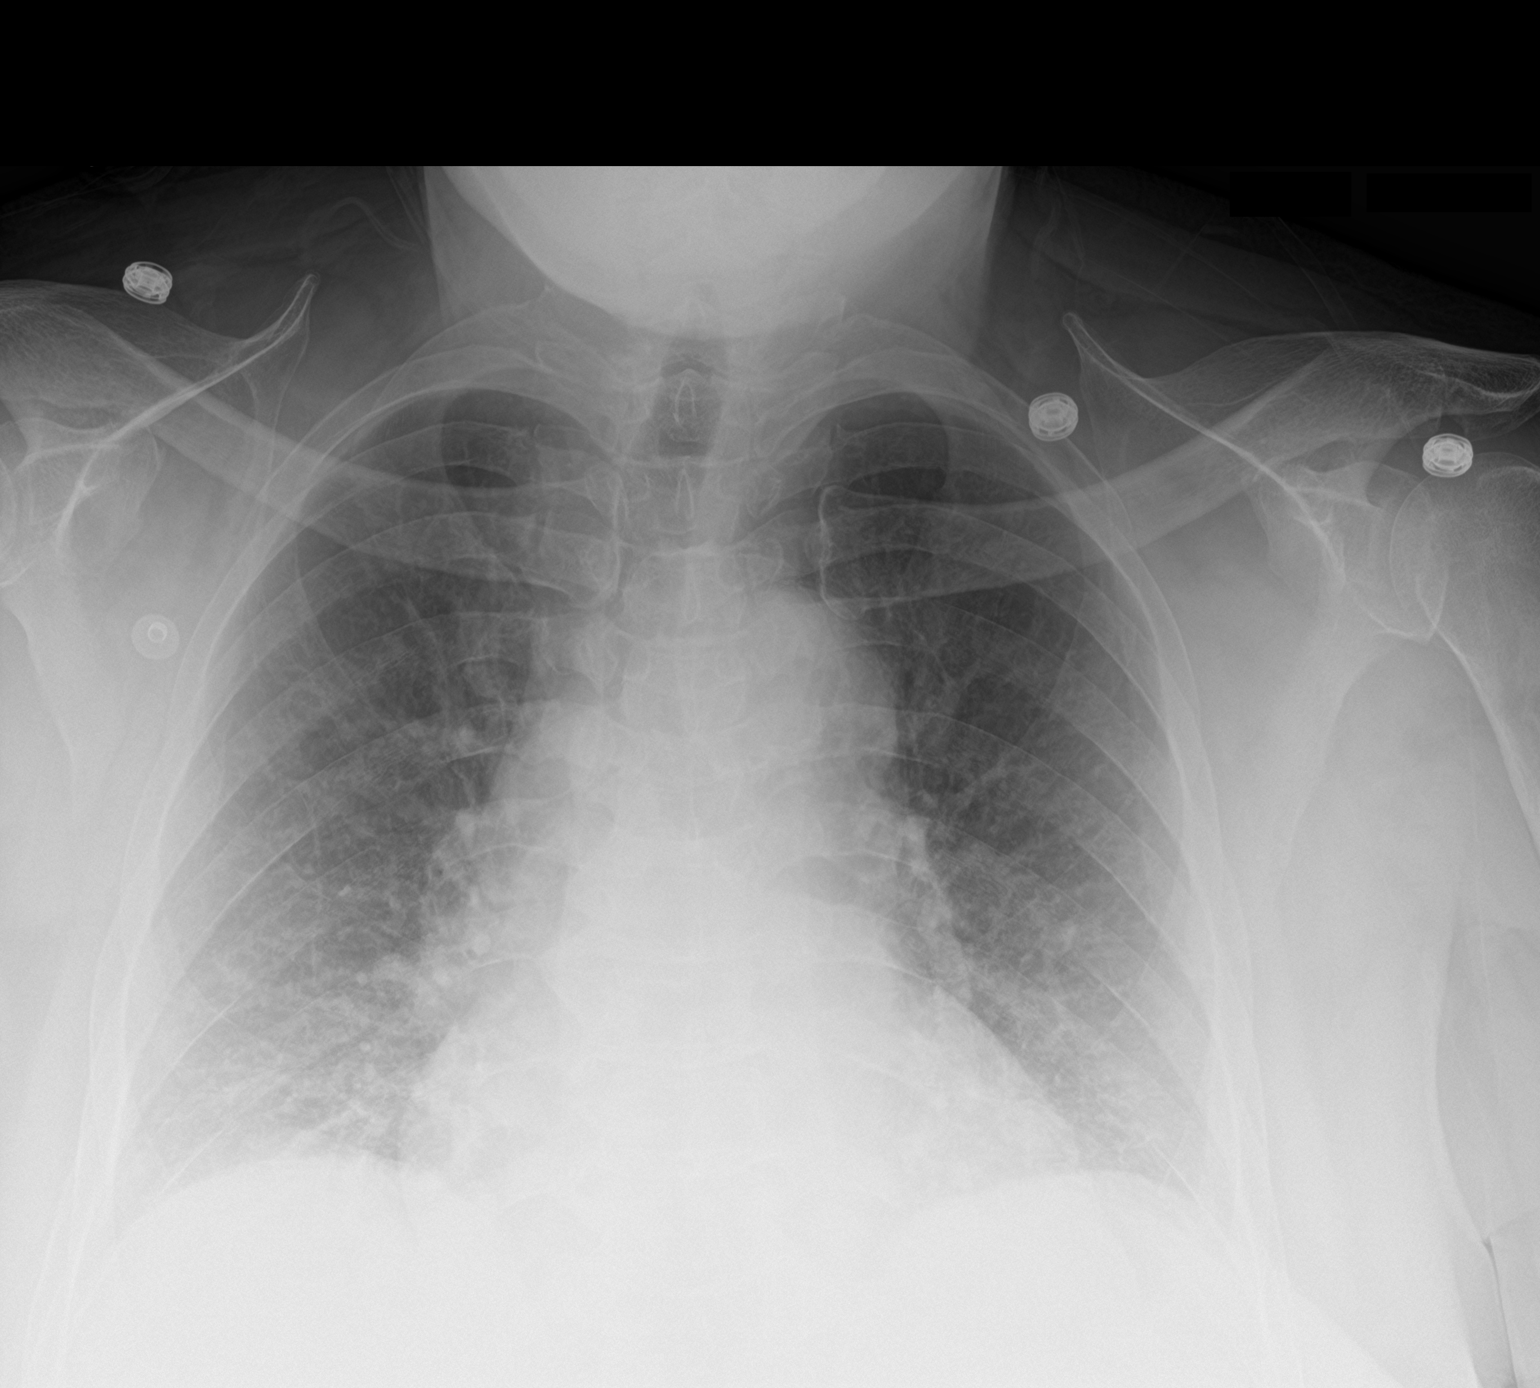

[2 of 2 positions shown; findings below may reference images not displayed]

FINDINGS: Lung volumes are low with accentuation of the cardiac silhouette and
accentuation of the lung markings bilaterally. No overt pulmonary
edema, airspace consolidation, pleural effusion, or pneumothorax is
identified. No acute osseous abnormality is seen.
IMPRESSION: Low lung volumes.

## 2020-12-10 IMAGING — CT CT HEAD W/O CM
4 series · 16 of 47 positions shown, 18 images · non-contrast
Comparison: Brain MRI [DATE].  Head CT [DATE].

CLINICAL DATA: 64-year-old female with dizziness.  Near syncope.

EXAM:
CT HEAD WITHOUT CONTRAST
TECHNIQUE: Contiguous axial images were obtained from the base of the skull
through the vertex without intravenous contrast.

[Series 3: head without · axial · non-contrast · 0.42mm/px · z∈[-8,+107]mm · 7 of 31 slices shown, 9 images]
[im 4/31  brain]
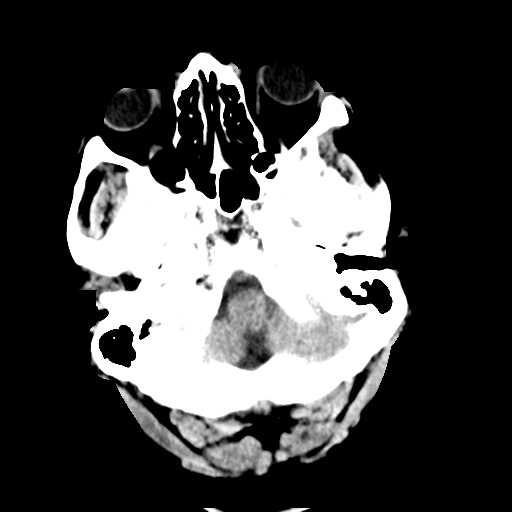
[im 4/31  bone]
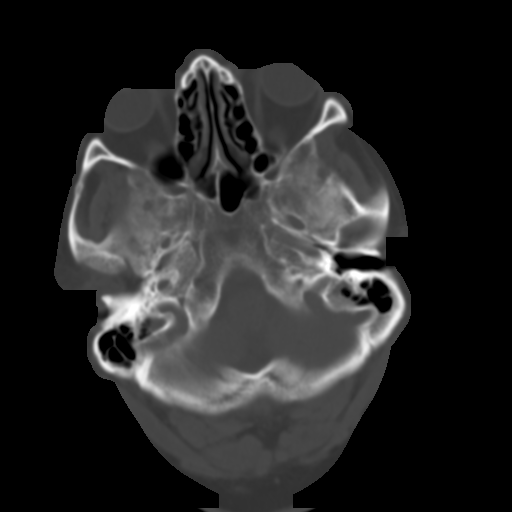
[im 8/31  brain]
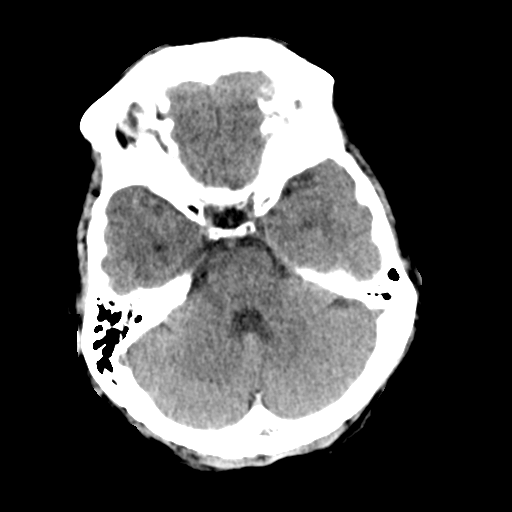
[im 12/31  brain]
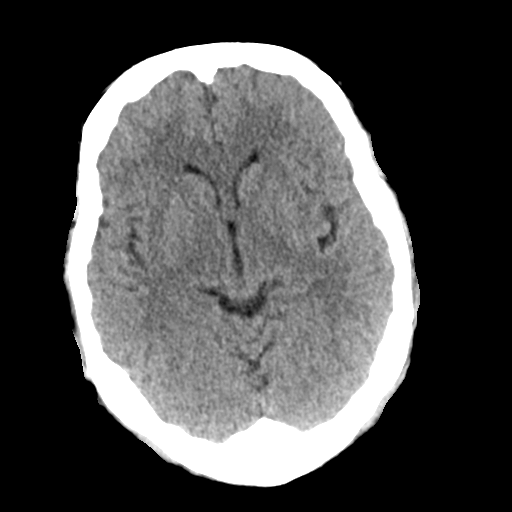
[im 16/31  brain]
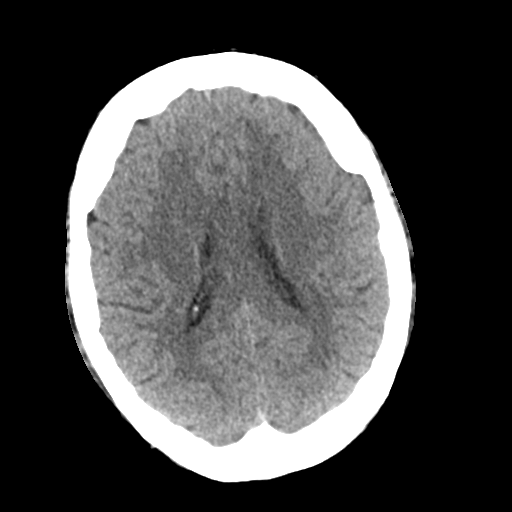
[im 19/31  brain]
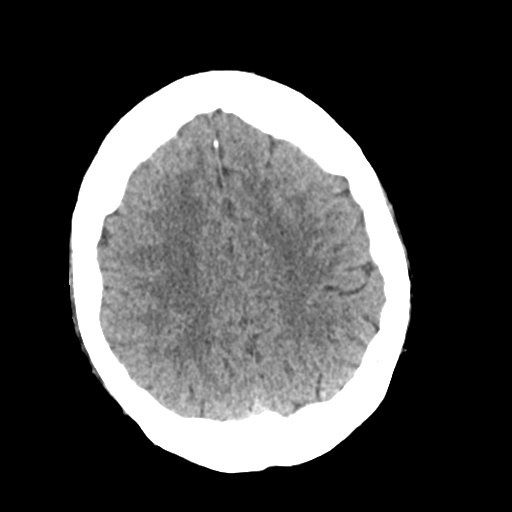
[im 19/31  bone]
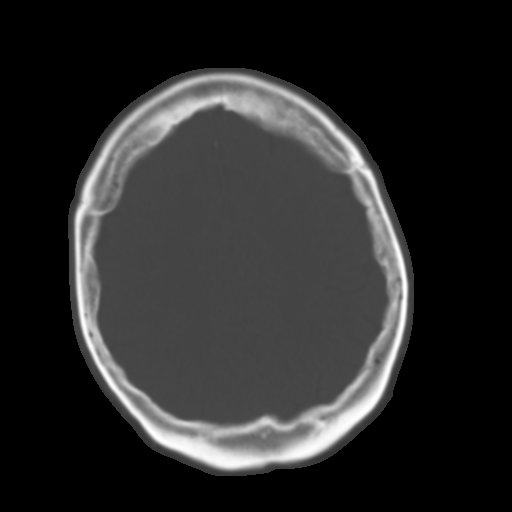
[im 23/31  brain]
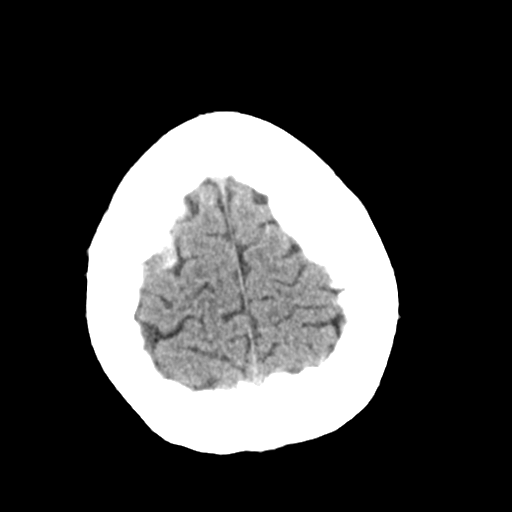
[im 27/31  brain]
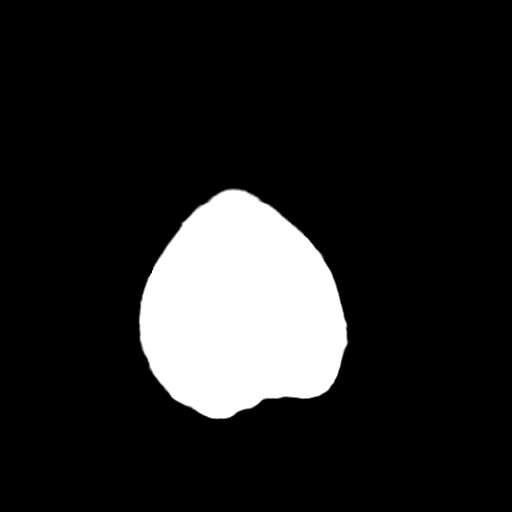

[Series 4: head bone · axial · 0.42mm/px · z∈[-9,+21]mm · 3 of 77 slices shown]
[im 8/77  bone]
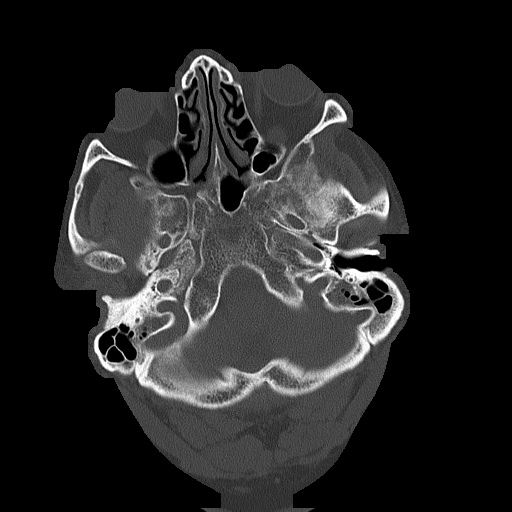
[im 16/77  bone]
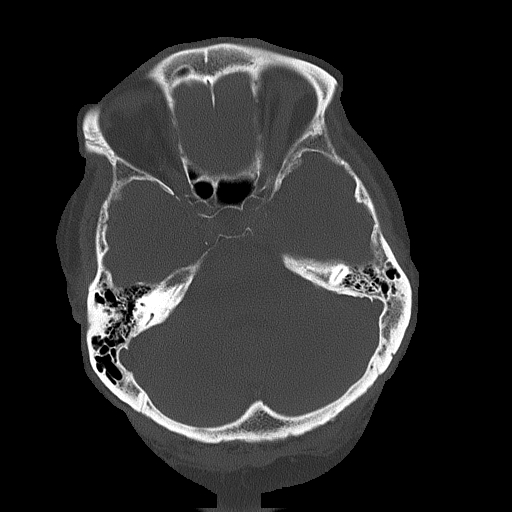
[im 23/77  bone]
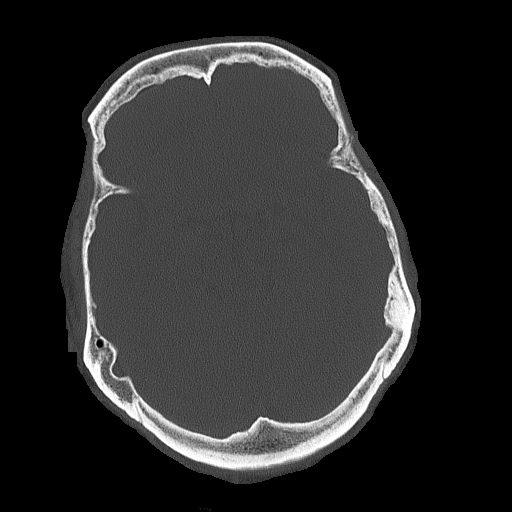

[Series 5: head without cor · coronal · non-contrast · 0.32mm/px · 3 of 67 slices shown]
[im 23/67  brain]
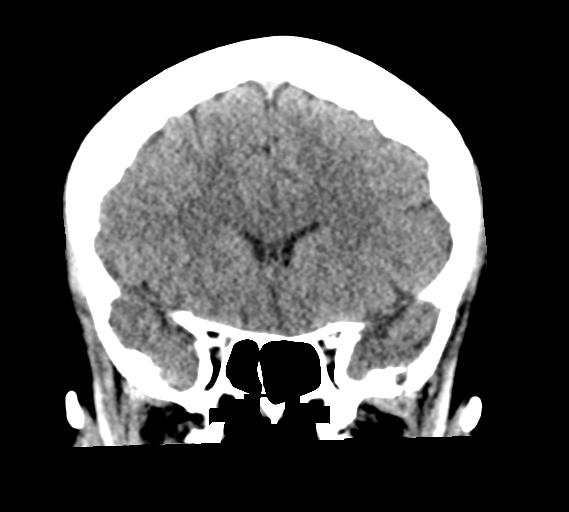
[im 30/67  brain]
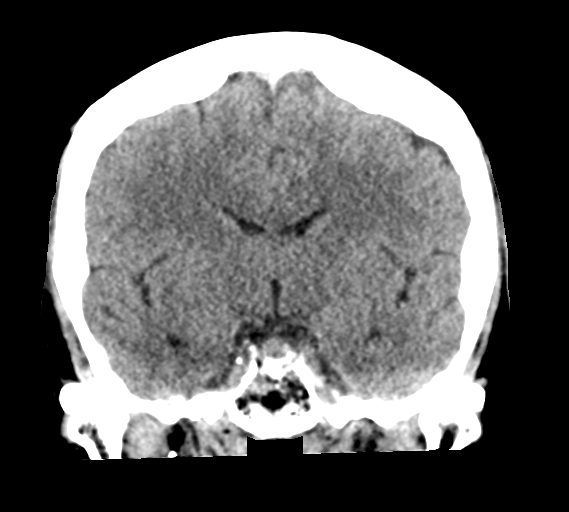
[im 37/67  brain]
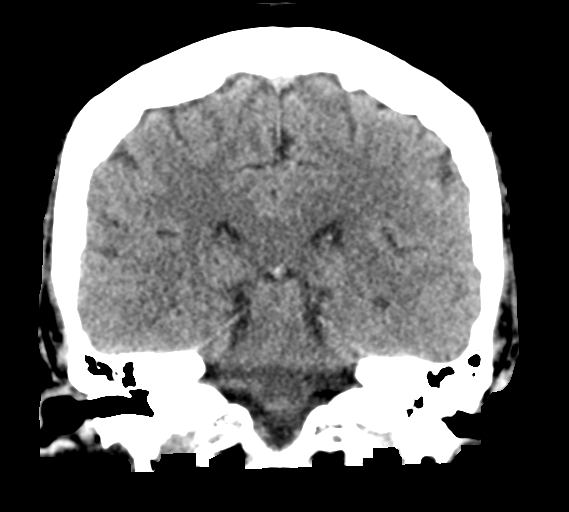

[Series 6: head without sag · sagittal · non-contrast · 0.33mm/px · 3 of 59 slices shown]
[im 20/59  brain]
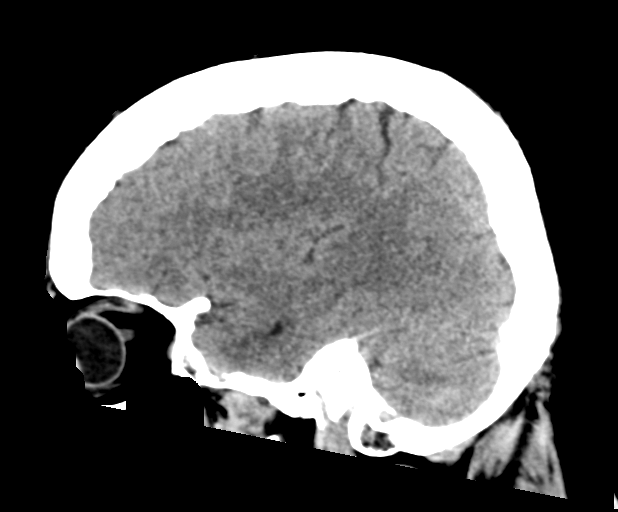
[im 30/59  brain]
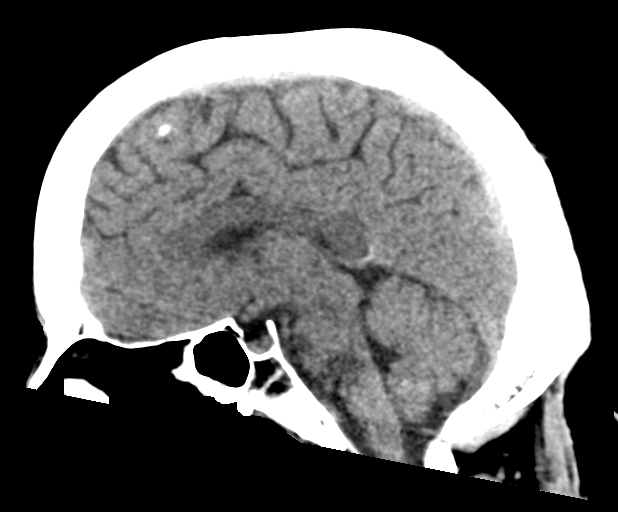
[im 39/59  brain]
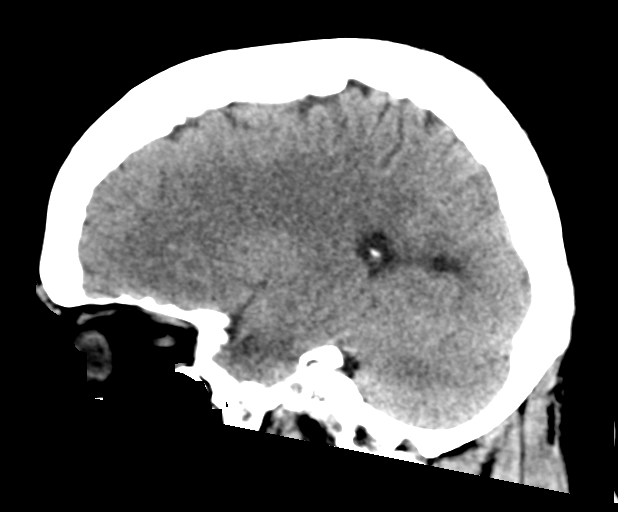

[16 of 47 positions shown; findings below may reference images not displayed]

FINDINGS: Brain: Stable cerebral volume, within normal limits for age. No
midline shift, ventriculomegaly, mass effect, evidence of mass
lesion, intracranial hemorrhage or evidence of cortically based
acute infarction. Gray-white matter differentiation is stable and
within normal limits for age.

Vascular: Mild Calcified atherosclerosis at the skull base. No
suspicious intracranial vascular hyperdensity.

Skull: Hyperostosis, normal variant. No acute osseous abnormality
identified.

Sinuses/Orbits: Visualized paranasal sinuses and mastoids are stable
and well aerated. Tympanic cavities remain clear.

Other: Visualized orbits and scalp soft tissues are within normal
limits.
IMPRESSION: Stable and normal for age non contrast CT appearance of the brain.

## 2020-12-10 MED ORDER — SODIUM CHLORIDE 0.9% FLUSH: 3.0000 mL | Freq: Once | INTRAVENOUS | Status: DC

## 2020-12-10 NOTE — ED Triage Notes (Signed)
EMS stated her sister stated, she has back pain and when its bad it causes her to have panic attacks.

## 2020-12-10 NOTE — ED Provider Notes (Signed)
Pt signed out by Dr. Sherry Ruffing pending 2nd trop.  2nd trop is normal.  Pt is feeling well.  She is to return if worse.  F/u with pcp.   Isla Pence, MD 12/10/20 [phone]

## 2020-12-10 NOTE — ED Triage Notes (Signed)
EMS stated a near syncope episode which is brought on by panick attacks. When we arrived she was having labored breathing.  Given Haldal 5 mg IM , Given 5 because the pt stated she was in her 86 ties and actually  41,

## 2020-12-10 NOTE — ED Notes (Signed)
Patient ambulatory to restroom  ?

## 2020-12-10 NOTE — ED Provider Notes (Signed)
Emergency Medicine Provider Triage Evaluation Note  Megan Lane , a 65 y.o. female  was evaluated in triage.  Pt complains of back pain, lightheadedness, near syncope today at work.  EMS reported it was near syncope, the patient reports that she had some loss of consciousness and does not member some part of the events today.  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  She reports she does not remember what happened after that but was told that she was placed down on the ground.  She has no complaints other than her chronic back pain, and shortness of breath she is experiencing for the past week.  No new medications.  Patient has a history of diabetes.  Denies any chest pain, palpitations, unilateral weakness, blurry vision, or headache.  EMS reported she was disoriented and gave them the wrong age and was unable to tell them the date  Review of Systems  Positive: LOC, syncope, lightheadedness, back pain Negative: chest pain, palpitations, unilateral weakness, blurry vision, or headache  Physical Exam  BP 122/86 (BP Location: Left Arm)   Pulse 82   Temp 99.4 F (37.4 C) (Oral)   Resp 14   SpO2 95%  Gen:   Awake, no distress   Resp:  Normal effort  MSK:   Moves extremities without difficulty, full strength in bilateral upper and lower extremities sensation intact Other:  Cranial nerves II through XII intact.  A&O x4  Medical Decision Making  Medically screening exam initiated at 10:17 AM.  Appropriate orders placed.  Olena Willy Cadogan was informed that the remainder of the evaluation will be completed by another provider, this initial triage assessment does not replace that evaluation, and the importance of remaining in the ED until their evaluation is complete.  Patient is not showing any neuro, vascular, or sensory deficits. Will place stroke order set without code stroke.    Sherrell Puller, PA-C 12/10/20 1021     Daleen Bo, MD 12/11/20 1719

## 2020-12-10 NOTE — ED Provider Notes (Signed)
[facility] EMERGENCY DEPARTMENT Provider Note   CSN: [id number] Arrival date & time: 12/10/20  1607     History No chief complaint on file.   Megan Lane is a 65 y.o. female.  The history is provided by the patient and medical records. No language interpreter was used.  Loss of Consciousness Episode history:  Single Most recent episode:  Today Timing:  Unable to specify Progression:  Unchanged Chronicity:  New Witnessed: yes   Relieved by:  Nothing Worsened by:  Nothing Associated symptoms: chest pain, malaise/fatigue, nausea and shortness of breath   Associated symptoms: no confusion, no diaphoresis, no fever, no focal weakness, no headaches, no palpitations, no recent fall, no seizures, no visual change, no vomiting and no weakness       Past Medical History:  Diagnosis Date   Asthma    as a child   Diabetes mellitus    Hypercholesterolemia    Hypertension    Stroke ([place])    Ventral hernia    Wears glasses     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   Acute bronchitis 07/09/2016   CAP (community acquired pneumonia) 07/08/2016   Chest pain 07/08/2016   Headache 07/08/2016   Weakness 07/08/2016   HLD (hyperlipidemia) 07/08/2016   Type 2 diabetes mellitus ([place]) 10/16/2013   Back muscle spasm 04/30/2013   Nasal congestion 04/30/2013   Essential hypertension, benign 04/30/2013   Pap smear for cervical cancer screening 03/20/2013    Past Surgical History:  Procedure Laterality Date   ABDOMINAL HYSTERECTOMY     KNEE SURGERY     LEG SURGERY     left lower extremity   TUBAL LIGATION     VENTRAL HERNIA REPAIR N/A 05/29/2019   Procedure: REPAIR OF VENTRAL HERNIA WITH MESH;  Surgeon: Erroll Luna, MD;  Location: [facility];  Service: General;  Laterality: N/A;     OB History   No obstetric history on file.     Family History  Problem  Relation Age of Onset   Diabetes Mother    Hypertension Mother    Stroke Mother    Heart disease Brother    Breast cancer Sister 87    Social History   Tobacco Use   Smoking status: Former    Types: Cigarettes    Quit date: 08/11/2005    Years since quitting: 15.3   Smokeless tobacco: Never  Vaping Use   Vaping Use: Never used  Substance Use Topics   Alcohol use: No   Drug use: No    Home Medications Prior to Admission medications   Medication Sig Start Date End Date Taking? Authorizing Provider  aspirin EC 81 MG tablet Take 1 tablet (81 mg total) by mouth daily. Swallow whole. 11/01/20   [name], MD  atorvastatin (LIPITOR) 20 MG tablet Take 1 tablet (20 mg total) by mouth daily. 11/01/20   [name], MD  Blood Glucose Monitoring Suppl (CONTOUR NEXT MONITOR) w/Device KIT 1 each by Does not apply route 3 (three) times daily as needed. 07/28/20   [name], MD  Dulaglutide (TRULICITY) 3.71 GG/2.6RS SOPN Inject 0.75 mg into the skin once a week. Patient not taking: Reported on 11/01/2020 07/28/20   [name], MD  DULoxetine (CYMBALTA) 60 MG capsule Take 1 capsule (60 mg total) by mouth daily. 11/01/20   [name], MD  FARXIGA 10 MG TABS tablet  TAKE 1 TABLET BY MOUTH ONCE DAILY BEFORE BREAKFAST 05/18/20   [name], MD  gabapentin (NEURONTIN) 300 MG capsule Take 2 capsules (600 mg total) by mouth 3 (three) times daily. 11/01/20   [name], MD  glipiZIDE (GLUCOTROL) 10 MG tablet Take 1 tablet (10 mg total) by mouth 2 (two) times daily before a meal. 11/01/20   [name], MD  glucose blood (CONTOUR NEXT TEST) test strip Use as instructed 07/28/20   [name], MD  hydrochlorothiazide (HYDRODIURIL) 25 MG tablet Take 1 tablet (25 mg total) by mouth daily. 11/01/20   [name], MD  HYDROcodone-acetaminophen (NORCO/VICODIN) 5-325 MG tablet Take 1 tablet by mouth every 6 (six) hours as needed for moderate pain. Patient not taking: Reported  on 11/01/2020 05/29/19   Erroll Luna, MD  ibuprofen (ADVIL) 600 MG tablet Take 1 tablet (600 mg total) by mouth 3 (three) times daily. With food X 7 days then prn pain 04/02/20   [name], MD  Lancets (ACCU-CHEK SOFT TOUCH) lancets Use as instructed 02/12/17   Alfonse Spruce, FNP  metFORMIN (GLUCOPHAGE) 1000 MG tablet Take 1 tablet (1,000 mg total) by mouth 2 (two) times daily with a meal. 04/28/20   [name], MD  methocarbamol (ROBAXIN) 500 MG tablet Take 2 tablets (1,000 mg total) by mouth 2 (two) times daily as needed for muscle spasms. 11/01/20   [name], MD    Allergies    Penicillins  Review of Systems   Review of Systems  Constitutional:  Positive for fatigue and malaise/fatigue. Negative for chills, diaphoresis and fever.  HENT:  Negative for congestion.   Respiratory:  Positive for chest tightness and shortness of breath. Negative for cough and wheezing.   Cardiovascular:  Positive for chest pain and syncope. Negative for palpitations and leg swelling.  Gastrointestinal:  Positive for nausea. Negative for abdominal pain, constipation, diarrhea and vomiting.  Genitourinary:  Negative for dysuria and flank pain.  Musculoskeletal:  Negative for back pain.  Skin:  Negative for rash and wound.  Neurological:  Positive for syncope and light-headedness. Negative for focal weakness, seizures, weakness, numbness and headaches.  Psychiatric/Behavioral:  Negative for confusion.   All other systems reviewed and are negative.  Physical Exam Updated Vital Signs BP 122/86 (BP Location: Left Arm)   Pulse 69   Temp 99.4 F (37.4 C) (Oral)   Resp 18   SpO2 98%   Physical Exam Vitals and nursing note reviewed.  Constitutional:      General: She is not in acute distress.    Appearance: She is well-developed. She is not ill-appearing, toxic-appearing or diaphoretic.  HENT:     Head: Normocephalic and atraumatic.     Nose: Nose normal. No congestion or rhinorrhea.      Mouth/Throat:     Mouth: Mucous membranes are moist.     Pharynx: No oropharyngeal exudate or posterior oropharyngeal erythema.  Eyes:     Extraocular Movements: Extraocular movements intact.     Conjunctiva/sclera: Conjunctivae normal.     Pupils: Pupils are equal, round, and reactive to light.  Cardiovascular:     Rate and Rhythm: Normal rate and regular rhythm.     Heart sounds: No murmur heard. Pulmonary:     Effort: Pulmonary effort is normal. No respiratory distress.     Breath sounds: Normal breath sounds. No stridor. No wheezing, rhonchi or rales.  Chest:     Chest wall: No tenderness.  Abdominal:     General: Abdomen is  flat.     Palpations: Abdomen is soft.     Tenderness: There is no abdominal tenderness. There is no right CVA tenderness, left CVA tenderness, guarding or rebound.  Musculoskeletal:        General: No tenderness.     Cervical back: Neck supple. No tenderness.     Right lower leg: No edema.     Left lower leg: No edema.  Skin:    General: Skin is warm and dry.     Capillary Refill: Capillary refill takes less than 2 seconds.     Findings: No erythema or rash.  Neurological:     General: No focal deficit present.     Mental Status: She is alert.     Sensory: No sensory deficit.     Motor: No weakness.  Psychiatric:        Mood and Affect: Mood normal.    ED Results / Procedures / Treatments   Labs (all labs ordered are listed, but only abnormal results are displayed) Labs Reviewed  COMPREHENSIVE METABOLIC PANEL - Abnormal; Notable for the following components:      Result Value   Total Bilirubin 0.2 (*)    All other components within normal limits  CBG MONITORING, ED - Abnormal; Notable for the following components:   Glucose-Capillary 122 (*)    All other components within normal limits  PROTIME-INR  APTT  CBC  DIFFERENTIAL  BRAIN NATRIURETIC PEPTIDE  TSH  MAGNESIUM  I-STAT CHEM 8, ED  CBG MONITORING, ED  TROPONIN I (HIGH  SENSITIVITY)  TROPONIN I (HIGH SENSITIVITY)    EKG EKG Interpretation  Date/Time:  Friday December 10 2020 09:49:25 EDT Ventricular Rate:  87 PR Interval:  142 QRS Duration: 88 QT Interval:  380 QTC Calculation: 457 R Axis:   2 Text Interpretation: Normal sinus rhythm Normal ECG When compared to prior, similar appearance. No STEMI Confirmed by Antony Blackbird [phone]) on 12/10/2020 2:43:34 PM  Radiology CT HEAD WO CONTRAST  Result Date: 12/10/2020 CLINICAL DATA:  65 year old female with dizziness.  Near syncope. EXAM: CT HEAD WITHOUT CONTRAST TECHNIQUE: Contiguous axial images were obtained from the base of the skull through the vertex without intravenous contrast. COMPARISON:  Brain MRI 01/23/2020.  Head CT 01/22/2020. FINDINGS: Brain: Stable cerebral volume, within normal limits for age. No midline shift, ventriculomegaly, mass effect, evidence of mass lesion, intracranial hemorrhage or evidence of cortically based acute infarction. Gray-white matter differentiation is stable and within normal limits for age. Vascular: Mild Calcified atherosclerosis at the skull base. No suspicious intracranial vascular hyperdensity. Skull: Hyperostosis, normal variant. No acute osseous abnormality identified. Sinuses/Orbits: Visualized paranasal sinuses and mastoids are stable and well aerated. Tympanic cavities remain clear. Other: Visualized orbits and scalp soft tissues are within normal limits. IMPRESSION: Stable and normal for age non contrast CT appearance of the brain. Electronically Signed   By: Genevie Ann M.D.   On: 12/10/2020 11:14    Procedures Procedures   Medications Ordered in ED Medications  sodium chloride flush (NS) 0.9 % injection 3 mL (3 mLs Intravenous Not Given 12/10/20 1232)    ED Course  I have reviewed the triage vital signs and the nursing notes.  Pertinent labs & imaging results that were available during my care of the patient were reviewed by me and considered in my medical  decision making (see chart for details).    MDM Rules/Calculators/A&P  Megan Lane is a 65 y.o. female with a past medical history significant for hypertension, hyperlipidemia, asthma, diabetes, and previous stroke who presents with a syncopal episode today.  According to patient, she was at work today when she started getting lightheaded.  She reports started feeling her heart racing, palpitations, chest pressure/tightness, and shortness of breath immediately followed by a syncopal episode.  She is unsure how long she was out for but EMS reportedly found her on the ground.  She reportedly got agitated and had to get Haldol to help settle her down but is now feeling better.  She reports she did not hit her head and is not any headache.  She reports that she has been doing well recently but was concerned about the episode today.  She reportedly said when she had a previous stroke she had double vision but she is not having any vision changes, numbness, tingling, weakness of extremities.  No difficulty with speech and she is denying any current pain.  She denies any recent fevers, chills, cough, vomiting, urinary changes, constipation, or diarrhea.  No recent medication changes.  She does report some mild congestion recently.  On exam, lungs are clear and chest is nontender.  Abdomen is nontender.  Patient had normal sensation and strength in extremities.  Patient well-appearing.  Symmetric smile.  Clear speech.  Pupils symmetric and reactive normal extraocular movements.  No carotid bruit appreciated.  Patient well-appearing with initially reassuring vital signs.  With her temperature of 99 degrees with his congestion, will get a COVID/flu swab.  With her chest pain, palpitations, shortness of breath, and lightheadedness breaching a single episode, I do feel she needs some cardiac work-up including troponins, BNP, magnesium, and TSH.  We will get chest x-ray and keep her on the  monitor.  Patient had a CT head in triage which was reassuring and she was informed of this.  We will be giving her some fluids as her mucous membranes were slightly dry on my exam.  If work-up is reassuring, anticipate shared decision-making conversation for possible discharge home however if she continues to have any recurrent symptoms or was concerning findings are discovered, anticipate touching base with cardiology given the preceding chest symptoms prior to his syncopal episode.  Anticipate reassessment after work-up.  Work-up began to return with initially negative troponin, nonelevated BNP, and otherwise reassuring labs.  CT head and chest x-ray reassuring.  I went through all the findings with the patient.  She is feeling better now and has no complaints.   Anticipate discharge after delta troponin.  Work-up continue to return reassuring.  Care transferred to oncoming team awaiting for delta troponin.  Suspect discharge and patient agrees if delta troponin negative.  Final Clinical Impression(s) / ED Diagnoses Final diagnoses:  Chest pain, unspecified type  Palpitations  Shortness of breath  Syncope, unspecified syncope type     Clinical Impression: 1. Chest pain, unspecified type   2. Palpitations   3. Shortness of breath   4. Syncope, unspecified syncope type     Disposition: Care transferred to oncoming team while waiting for the rest of the work-up and reassessment.  If work-up is reassuring, patient is likely stable for discharge home however if she has recurrent symptoms or any concerning findings discovered, anticipate touching base with cardiology for syncopal episode with preceding palpitations, chest pain, shortness of breath.  This note was prepared with assistance of Systems analyst. Occasional wrong-word or sound-a-like substitutions may have occurred due  to the inherent limitations of voice recognition software.     Devera Englander, Gwenyth Allegra,  MD 12/10/20 [phone]

## 2020-12-10 NOTE — ED Triage Notes (Signed)
IV left AC

## 2020-12-19 DIAGNOSIS — M7711 Lateral epicondylitis, right elbow: Secondary | ICD-10-CM | POA: Diagnosis not present

## 2021-01-10 ENCOUNTER — Other Ambulatory Visit: Payer: Self-pay | Admitting: Family Medicine

## 2021-01-10 DIAGNOSIS — E1140 Type 2 diabetes mellitus with diabetic neuropathy, unspecified: Secondary | ICD-10-CM

## 2021-01-14 ENCOUNTER — Telehealth: Payer: Self-pay | Admitting: Family Medicine

## 2021-01-14 NOTE — Telephone Encounter (Signed)
Can medication be changed due to cost.

## 2021-01-14 NOTE — Telephone Encounter (Signed)
Copied from [place] [id number]. Topic: General - Other >> Jan 13, 2021 12:27 PM Tessa Lerner A wrote: Reason for CRM: The patient would like to speak with a member of clinical staff when possible about the cost of their prescriptions  The patient shares that their dapagliflozin propanediol (FARXIGA) 10 MG TABS tablet [[id number]]  prescription will be more than $400  The patient is concerned with the cost of this medication and would like to address these concerns with a member of clinical staff  Please contact further when available

## 2021-01-17 ENCOUNTER — Telehealth: Payer: Self-pay | Admitting: Adult Health

## 2021-01-17 ENCOUNTER — Encounter: Payer: Self-pay | Admitting: Adult Health

## 2021-01-17 ENCOUNTER — Ambulatory Visit: Payer: BC Managed Care – PPO | Admitting: Adult Health

## 2021-01-17 NOTE — Telephone Encounter (Signed)
Yes ma'am. Called and set patient up with a copay card for the [facility]. You're correct we did something similar for her Trulicity.

## 2021-01-17 NOTE — Progress Notes (Deleted)
[facility] [address] [place]. [place] [address] [phone]       OFFICE CONSULT NOTE  [name] Date of Birth:  1955-05-19 Medical Record Number:  [id number]   Referring MD: Lynwood Dawley PA-C  Reason for Referral: Stroke  No chief complaint on file.   HPI:   Update 01/17/2021 JM: Patient returns for stroke follow-up after prior visit 9 months ago with Dr. Leonie Man.  Stable from stroke standpoint -denies new or reoccurring stroke/TIA symptoms Compliant on aspirin and atorvastatin -denies side effects Blood pressure today *** Recent A1c 8.8          History provided for reference purposes only Consult visit 04/13/2020 Dr. Leonie Man: [name] is a 66 year old African-American lady seen today for initial office consultation visit for stroke.  History is obtained from the patient, review of electronic medical records and I personally reviewed imaging films in PACS.  She has past medical history of hyperlipidemia, diabetes, hypertension and obesity who presented on 01/22/2020 with sudden onset of dizziness which she describes as the world spinning along with mild nausea and some tingling of fingertips bilaterally.  The symptoms are intermittent and triggered by certain head movements.  She on exam by neuro hospitalist was found to have a significant head tilt to the left with mild chin tuck and due to severe disability from her double vision and vertigo was given IV TPA.  Symptoms gradually resolved after that and she was back to baseline in about 10 to 12 hours.  MRI scan of the brain was obtained which showed no acute infarct.  MRA of the brain and neck showed no large vessel extracranial intracranial stenosis.  2D echo showed ejection fraction of 55 to 60% without cardiac source of embolism.  LDL cholesterol was elevated at 70 mg percent and hemoglobin A1c was 8.8.  Urine drug screen was negative patient was started on dual antiplatelet therapy aspirin  and Plavix for 3 weeks and after that she stopped the Plavix and is currently on aspirin alone.  She is doing well and she has had no recurrent TIA or stroke symptoms.  She is tolerating aspirin well without bruising or bleeding.  Her blood pressure is usually well controlled at home the today it is slightly elevated in office at 144/89.  She states she is eating healthy and walks 30 minutes every day and has lost 10 pounds.  She quit smoking in 2007 and does not drink alcohol.  There is no prior history of strokes or TIAs or significant neurological problems.  There is no family history of strokes.  ROS:   14 system review of systems is positive for dizziness, diplopia, nausea, tingling numbness, gait ataxia all other systems negative  PMH:  Past Medical History:  Diagnosis Date   Asthma    as a child   Diabetes mellitus    Hypercholesterolemia    Hypertension    Stroke ([place])    Ventral hernia    Wears glasses     Social History:  Social History   Socioeconomic History   Marital status: Single    Spouse name: Not on file   Number of children: Not on file   Years of education: Not on file   Highest education level: Not on file  Occupational History   Occupation: Part time (04/13/20)  Tobacco Use   Smoking status: Former    Types: Cigarettes    Quit date: 08/11/2005    Years since quitting: 28.4  Smokeless tobacco: Never  Vaping Use   Vaping Use: Never used  Substance and Sexual Activity   Alcohol use: No   Drug use: No   Sexual activity: Not Currently  Other Topics Concern   Not on file  Social History Narrative   Lives with son   Right Handed   Drinks rarely caffeine   Social Determinants of Health   Financial Resource Strain: Not on file  Food Insecurity: Not on file  Transportation Needs: Not on file  Physical Activity: Not on file  Stress: Not on file  Social Connections: Not on file  Intimate Partner Violence: Not on file    Medications:   Current  Outpatient Medications on File Prior to Visit  Medication Sig Dispense Refill   aspirin EC 81 MG tablet Take 1 tablet (81 mg total) by mouth daily. Swallow whole. 30 tablet 11   atorvastatin (LIPITOR) 20 MG tablet Take 1 tablet (20 mg total) by mouth daily. 30 tablet 6   Blood Glucose Monitoring Suppl (CONTOUR NEXT MONITOR) w/Device KIT 1 each by Does not apply route 3 (three) times daily as needed. 1 kit 0   dapagliflozin propanediol (FARXIGA) 10 MG TABS tablet TAKE 1 TABLET BY MOUTH ONCE DAILY BEFORE BREAKFAST 30 tablet 0   Dulaglutide (TRULICITY) 5.17 OH/6.0VP SOPN Inject 0.75 mg into the skin once a week. (Patient not taking: Reported on 11/01/2020) 2 mL 6   DULoxetine (CYMBALTA) 60 MG capsule Take 1 capsule (60 mg total) by mouth daily. 30 capsule 3   gabapentin (NEURONTIN) 300 MG capsule Take 2 capsules (600 mg total) by mouth 3 (three) times daily. 180 capsule 6   glipiZIDE (GLUCOTROL) 10 MG tablet Take 1 tablet (10 mg total) by mouth 2 (two) times daily before a meal. 60 tablet 4   glucose blood (CONTOUR NEXT TEST) test strip Use as instructed 100 each 12   hydrochlorothiazide (HYDRODIURIL) 25 MG tablet Take 1 tablet (25 mg total) by mouth daily. 90 tablet 1   ibuprofen (ADVIL) 600 MG tablet Take 1 tablet (600 mg total) by mouth 3 (three) times daily. With food X 7 days then prn pain 60 tablet 2   Lancets (ACCU-CHEK SOFT TOUCH) lancets Use as instructed 100 each 12   metFORMIN (GLUCOPHAGE) 1000 MG tablet Take 1 tablet (1,000 mg total) by mouth 2 (two) times daily with a meal. 60 tablet 6   methocarbamol (ROBAXIN) 500 MG tablet Take 2 tablets (1,000 mg total) by mouth 2 (two) times daily as needed for muscle spasms. 120 tablet 1   No current facility-administered medications on file prior to visit.    Allergies:   Allergies  Allergen Reactions   Penicillins Rash    Did it involve swelling of the face/tongue/throat, SOB, or low BP? {Y/N/UNK:3040802 Did it involve sudden or severe  rash/hives, skin peeling, or any reaction on the inside of your mouth or nose? Y Did you need to seek medical attention at a hospital or doctor's office? Y When did it last happen?  Several years ago     If all above answers are "NO", may proceed with cephalosporin use.     Physical Exam There were no vitals filed for this visit. There is no height or weight on file to calculate BMI.   General: Obese middle-aged African-American lady, seated, in no evident distress Head: head normocephalic and atraumatic.   Neck: supple with no carotid or supraclavicular bruits Cardiovascular: regular rate and rhythm, no murmurs Musculoskeletal: no deformity.  Wearing right wrist brace. Skin:  no rash/petichiae Vascular:  Normal pulses all extremities  Neurologic Exam Mental Status: Awake and fully alert. Oriented to place and time. Recent and remote memory intact. Attention span, concentration and fund of knowledge appropriate. Mood and affect appropriate.  Cranial Nerves: Pupils equal, briskly reactive to light. Extraocular movements full without nystagmus. Visual fields full to confrontation. Hearing intact. Facial sensation intact. Face, tongue, palate moves normally and symmetrically.  Motor: Normal bulk and tone. Normal strength in all tested extremity muscles. Sensory.: intact to touch , pinprick , position and vibratory sensation.  Coordination: Rapid alternating movements normal in all extremities. Finger-to-nose and heel-to-shin performed accurately bilaterally. Gait and Station: Arises from chair without difficulty. Stance is normal. Gait demonstrates normal stride length and balance . Able to heel, toe and tandem walk with moderate difficulty.  Reflexes: 1+ and symmetric. Toes downgoing.      ASSESSMENT: 65 year old African-American lady with transient episode of vertigo, diplopia and hand paresthesias in December 2021 treated with IV TPA likely small MRI negative brainstem infarct who is  doing well.  Vascular risk factors of diabetes, hypertension, hyperlipidemia and obesity     PLAN: -Continue aspirin 81 mg daily and atorvastatin 10 mg daily for secondary stroke prevention and  -Close PCP follow-up for aggressive stroke risk management - maintain strict control of hypertension with blood pressure goal below 130/90, diabetes with hemoglobin A1c goal below 6.5% and lipids with LDL cholesterol goal below 70 mg/dL.        CC:  [name], MD   I spent *** minutes of face-to-face and non-face-to-face time with patient.  This included previsit chart review, lab review, study review, order entry, electronic health record documentation, patient education  Frann Rider, [facility]  [facility] [address] [place], [place] [address]  Phone [phone] Fax [phone] Note: This document was prepared with digital dictation and possible smart phrase technology. Any transcriptional errors that result from this process are unintentional.

## 2021-01-17 NOTE — Telephone Encounter (Signed)
Megan Lane, Can you please assist with available medication options?  I know we have gone down this route before.  Thank you for your help.

## 2021-01-17 NOTE — Telephone Encounter (Signed)
FYI- Pt called to reschedule her appt today, had a family emergency to occur over in the morning this morning.

## 2021-01-17 NOTE — Telephone Encounter (Signed)
Noted, thank you

## 2021-01-20 ENCOUNTER — Other Ambulatory Visit: Payer: Self-pay

## 2021-01-20 ENCOUNTER — Telehealth: Payer: Self-pay | Admitting: Family Medicine

## 2021-01-20 MED ORDER — EMPAGLIFLOZIN 25 MG PO TABS: 25.0000 mg | ORAL_TABLET | Freq: Every day | ORAL | 1 refills | Status: DC

## 2021-01-20 NOTE — Telephone Encounter (Signed)
Copied from [place] [id number]. Topic: General - Other >> Jan 19, 2021  2:20 PM McGill, Nelva Bush wrote: Reason for CRM: Pt is asking if she can pick up coupon for medication,dapagliflozin propanediol (FARXIGA) 10 MG TABS tablet  Please advise.

## 2021-01-20 NOTE — Telephone Encounter (Signed)
I have not called the patient yet.  I called [facility] with the coupon information for her Wilder Glade and with ins and coupon her copay is still over $300.  I ran a test claim through for Jardiance and it came out to $0

## 2021-01-20 NOTE — Telephone Encounter (Signed)
Switched from [place] to [place].  Please inform the patient of this change due to the cost.  Thank you

## 2021-01-20 NOTE — Telephone Encounter (Signed)
Left voicemail for patient that Wilder Glade was changed to Jardiance due to cost.  I called [facility] and verified that her copay for Jardiance is $0.

## 2021-01-25 ENCOUNTER — Ambulatory Visit: Payer: BC Managed Care – PPO | Admitting: Family Medicine

## 2021-02-18 ENCOUNTER — Ambulatory Visit: Payer: Self-pay | Admitting: *Deleted

## 2021-02-18 DIAGNOSIS — G8929 Other chronic pain: Secondary | ICD-10-CM

## 2021-02-18 DIAGNOSIS — M5441 Lumbago with sciatica, right side: Secondary | ICD-10-CM

## 2021-02-18 NOTE — Telephone Encounter (Signed)
This encounter was created in error - please disregard.

## 2021-02-18 NOTE — Telephone Encounter (Signed)
Second call to patient- left message to call back

## 2021-02-18 NOTE — Telephone Encounter (Signed)
Third attempt to reach patient- advised no one by that name at number- did call patient daughter and verified calling correct number. She will have patient call the office.

## 2021-02-18 NOTE — Telephone Encounter (Signed)
Summary: medication refill   Pt called to get a refill for her muscle spasm medication but doesn't know the name of it and stated she had an issue will her spasms last night / pt also asked to speak with Dr. Benard Rink nurse/ please advise     Attempted to call patient- left message to call office

## 2021-02-18 NOTE — Telephone Encounter (Signed)
°  Chief Complaint: muscle spasms Symptoms: hand spasms, hurting today and numbness in fingertips Frequency: several weeks but episode last night Pertinent Negatives: NA Disposition: [] ED /[] Urgent Care (no appt availability in office) / [] Appointment(In office/virtual)/ []  [facility]/ [] Home Care/ [] Refused Recommended Disposition /[] [facility]/ [x]  Follow-up with PCP Additional Notes: Pt called in wanting refill for Robaxin. Pt also states she went to [facility] and attempted to pick up Jardiance but rx is too expensive. Pt is asking if she can have something to replace it. Pt state she has muscle spasms bad last night in her hands and then today still hurting and has some numbness in fingertips. Offered to schedule an appt but pt wants to see if meds can be refilled and go from there. Advised her I will send to Dr. Margarita Rana and if we need to give her a call back we will. Pt verbalized understanding.   Reason for Disposition  Muscle aches are a chronic symptom (recurrent or ongoing AND present > 4 weeks)  Answer Assessment - Initial Assessment Questions 1. ONSET: "When did the muscle aches or body pains start?"      Last night 2. LOCATION: "What part of your body is hurting?" (e.g., entire body, arms, legs)      hands 3. SEVERITY: "How bad is the pain?" (Scale 1-10; or mild, moderate, severe)   - MILD (1-3): doesn't interfere with normal activities    - MODERATE (4-7): interferes with normal activities or awakens from sleep    - SEVERE (8-10):  excruciating pain, unable to do any normal activities      10 6. OTHER SYMPTOMS: "Do you have any other symptoms?" (e.g., chest pain, weakness, rash, cold or flu symptoms, weight loss)     Hands hurting today and numbness in some fingertips.  Protocols used: Muscle Aches and Body Pain-A-AH

## 2021-02-22 NOTE — Telephone Encounter (Signed)
Pt called in wanting refill for Robaxin. Pt also states she went to [facility] and attempted to pick up Jardiance but rx is too expensive. Pt is asking if she can have something to replace it. Pt state she has muscle spasms bad last night in her hands and then today still hurting and has some numbness in fingertips. Offered to schedule an appt but pt wants to see if meds can be refilled and go from there. Advised her I will send to Dr. Margarita Rana and if we need to give her a call back we will. Pt verbalized understanding.

## 2021-02-23 ENCOUNTER — Telehealth: Payer: Self-pay | Admitting: Pharmacist

## 2021-02-23 ENCOUNTER — Other Ambulatory Visit: Payer: Self-pay | Admitting: Pharmacist

## 2021-02-23 ENCOUNTER — Other Ambulatory Visit: Payer: Self-pay

## 2021-02-23 MED ORDER — METHOCARBAMOL 500 MG PO TABS: 1000.0000 mg | ORAL_TABLET | Freq: Two times a day (BID) | ORAL | 1 refills | Status: DC | PRN

## 2021-02-23 NOTE — Telephone Encounter (Signed)
[address] has a copay card that the patient could use. I attempted to call and provide her with this but she was unavailable. I left a VM with instructions but if she contacts you I have copied and pasted the information she will need below. She can also google "[name]" and select the PDF of the card on her phone and show this to her pharmacy.   RxBIN: [id number] RxPCN: [id number] RxGRP: [id number] Issuer: [phone]) ID: [id number]

## 2021-02-23 NOTE — Addendum Note (Signed)
Addended byCharlott Rakes on: 02/23/2021 09:15 AM   Modules accepted: Orders

## 2021-02-23 NOTE — Telephone Encounter (Signed)
Can you please see if we have an alternative for Jardiance?  I have sent a refill for Robaxin.  Thank you.

## 2021-02-25 NOTE — Telephone Encounter (Signed)
Pt has been sent a Mychart message regarding medication.

## 2021-03-08 ENCOUNTER — Other Ambulatory Visit: Payer: Self-pay | Admitting: Family Medicine

## 2021-03-08 ENCOUNTER — Ambulatory Visit: Payer: BC Managed Care – PPO | Admitting: Adult Health

## 2021-03-08 DIAGNOSIS — E1140 Type 2 diabetes mellitus with diabetic neuropathy, unspecified: Secondary | ICD-10-CM

## 2021-03-08 MED ORDER — DULOXETINE HCL 60 MG PO CPEP: 60.0000 mg | ORAL_CAPSULE | Freq: Every day | ORAL | 0 refills | Status: DC

## 2021-03-08 NOTE — Telephone Encounter (Signed)
Medication Refill - Medication: Duloxetine 60mg   Has the patient contacted their pharmacy? Yes.  Pharmay cask her to call the office (Agent: If no, request that the patient contact the pharmacy for the refill. If patient does not wish to contact the pharmacy document the reason why and proceed with request.) (Agent: If yes, when and what did the pharmacy advise?)  Preferred Pharmacy (with phone number or street name): [place] Has the patient been seen for an appointment in the last year OR does the patient have an upcoming appointment? Yes.    Agent: Please be advised that RX refills may take up to 3 business days. We ask that you follow-up with your pharmacy.

## 2021-03-08 NOTE — Telephone Encounter (Signed)
Requested Prescriptions  Pending Prescriptions Disp Refills   DULoxetine (CYMBALTA) 60 MG capsule 30 capsule 0    Sig: Take 1 capsule (60 mg total) by mouth daily.     Psychiatry: Antidepressants - SNRI - duloxetine Passed - 03/08/2021  3:25 PM      Passed - Cr in normal range and within 360 days    Creat  Date Value Ref Range Status  02/09/2015 1.00 0.50 - 1.05 mg/dL Final   Creatinine, Ser  Date Value Ref Range Status  12/10/2020 0.96 0.44 - 1.00 mg/dL Final   Creatinine, POC  Date Value Ref Range Status  12/04/2016 30 mg/dL Final         Passed - eGFR is 30 or above and within 360 days    GFR, Est African American  Date Value Ref Range Status  08/12/2014 >89 mL/min Final   GFR calc Af Amer  Date Value Ref Range Status  08/18/2019 64 >59 mL/min/1.73 Final    Comment:    **[facility] currently reports eGFR in compliance with the current**   recommendations of the [employer]. [facility] will   update reporting as new guidelines are published from the NKF-ASN   Task force.    GFR, Est Non African American  Date Value Ref Range Status  08/12/2014 82 mL/min Final    Comment:      The estimated GFR is a calculation valid for adults (>=74 years old) that uses the CKD-EPI algorithm to adjust for age and sex. It is   not to be used for children, pregnant women, hospitalized patients,    patients on dialysis, or with rapidly changing kidney function. According to the NKDEP, eGFR >89 is normal, 60-89 shows mild impairment, 30-59 shows moderate impairment, 15-29 shows severe impairment and <15 is ESRD.      GFR, Estimated  Date Value Ref Range Status  12/10/2020 >60 >60 mL/min Final    Comment:    (NOTE) Calculated using the CKD-EPI Creatinine Equation (2021)          Passed - Completed PHQ-2 or PHQ-9 in the last 360 days      Passed - Last BP in normal range    BP Readings from Last 1 Encounters:  12/10/20 130/88         Passed - Valid encounter  within last 6 months    Recent Outpatient Visits          4 months ago Type 2 diabetes mellitus with other specified complication, without long-term current use of insulin ([place])   [place], [employer], MD   7 months ago Type 2 diabetes mellitus with other specified complication, without long-term current use of insulin ([place])   [place], [place], MD   10 months ago Type 2 diabetes mellitus with other specified complication, without long-term current use of insulin ([place])   [place], [place], MD   1 year ago Type 2 diabetes mellitus with diabetic neuropathy, without long-term current use of insulin ([place])   [place], [place], MD   1 year ago Type 2 diabetes mellitus with diabetic neuropathy, without long-term current use of insulin ([place])   [place], [place], MD      Future Appointments            In 3 weeks [name], MD [facility]

## 2021-03-29 ENCOUNTER — Encounter: Payer: Self-pay | Admitting: Family Medicine

## 2021-03-29 ENCOUNTER — Ambulatory Visit: Payer: BC Managed Care – PPO | Attending: Family Medicine | Admitting: Family Medicine

## 2021-03-29 ENCOUNTER — Other Ambulatory Visit: Payer: Self-pay

## 2021-03-29 VITALS — BP 143/88 | HR 99 | Ht 72.0 in | Wt 233.0 lb

## 2021-03-29 DIAGNOSIS — E1169 Type 2 diabetes mellitus with other specified complication: Secondary | ICD-10-CM

## 2021-03-29 DIAGNOSIS — M79672 Pain in left foot: Secondary | ICD-10-CM | POA: Diagnosis not present

## 2021-03-29 DIAGNOSIS — E785 Hyperlipidemia, unspecified: Secondary | ICD-10-CM

## 2021-03-29 DIAGNOSIS — Z1211 Encounter for screening for malignant neoplasm of colon: Secondary | ICD-10-CM

## 2021-03-29 DIAGNOSIS — I10 Essential (primary) hypertension: Secondary | ICD-10-CM

## 2021-03-29 DIAGNOSIS — E1140 Type 2 diabetes mellitus with diabetic neuropathy, unspecified: Secondary | ICD-10-CM

## 2021-03-29 DIAGNOSIS — M5441 Lumbago with sciatica, right side: Secondary | ICD-10-CM

## 2021-03-29 LAB — POCT GLYCOSYLATED HEMOGLOBIN (HGB A1C): HbA1c, POC (controlled diabetic range): 8.7 % — AB (ref 0.0–7.0)

## 2021-03-29 LAB — GLUCOSE, POCT (MANUAL RESULT ENTRY): POC Glucose: 98 mg/dl (ref 70–99)

## 2021-03-29 MED ORDER — ATORVASTATIN CALCIUM 20 MG PO TABS: 20.0000 mg | ORAL_TABLET | Freq: Every day | ORAL | 1 refills | Status: DC

## 2021-03-29 MED ORDER — GLIPIZIDE 10 MG PO TABS: 10.0000 mg | ORAL_TABLET | Freq: Two times a day (BID) | ORAL | 1 refills | Status: DC

## 2021-03-29 MED ORDER — IBUPROFEN 600 MG PO TABS: 600.0000 mg | ORAL_TABLET | Freq: Three times a day (TID) | ORAL | 2 refills | Status: DC

## 2021-03-29 MED ORDER — TRULICITY 0.75 MG/0.5ML ~~LOC~~ SOAJ: 0.7500 mg | SUBCUTANEOUS | 6 refills | Status: DC

## 2021-03-29 MED ORDER — EMPAGLIFLOZIN 25 MG PO TABS: 25.0000 mg | ORAL_TABLET | Freq: Every day | ORAL | 1 refills | Status: DC

## 2021-03-29 MED ORDER — DULOXETINE HCL 60 MG PO CPEP: 60.0000 mg | ORAL_CAPSULE | Freq: Every day | ORAL | 1 refills | Status: DC

## 2021-03-29 MED ORDER — GABAPENTIN 300 MG PO CAPS: 600.0000 mg | ORAL_CAPSULE | Freq: Three times a day (TID) | ORAL | 1 refills | Status: DC

## 2021-03-29 MED ORDER — HYDROCHLOROTHIAZIDE 25 MG PO TABS: 25.0000 mg | ORAL_TABLET | Freq: Every day | ORAL | 1 refills | Status: DC

## 2021-03-29 MED ORDER — METFORMIN HCL 1000 MG PO TABS: 1000.0000 mg | ORAL_TABLET | Freq: Two times a day (BID) | ORAL | 1 refills | Status: DC

## 2021-03-29 NOTE — Patient Instructions (Signed)
Diclofenac Gel What is this medication? DICLOFENAC (dye KLOE fen ak) treats joint pain caused by arthritis. It works by decreasing inflammation. It belongs to a group of medications called NSAIDs. This medicine may be used for other purposes; ask your health care provider or pharmacist if you have questions. COMMON BRAND NAME(S): Aspercreme Arthritis Pain Reliever, DICLOPREP, DSG Pak, Motrin Arthritis Pain, Omeca, Solaravix, Solaraze, ValcoPrep-100, VennGel One, Voltaren Arthritis, Voltaren Gel What should I tell my care team before I take this medication? They need to know if you have any of these conditions: Bleeding disorders Coronary artery bypass graft (CABG) within the past 2 weeks Heart attack Heart disease Heart failure High blood pressure If you often drink alcohol Kidney disease Large area of burned or damaged skin Liver disease Low red blood cell counts Lung or breathing disease (asthma) Receiving steroids like dexamethasone or prednisone Smoke cigarettes Skin conditions or sensitivity Stomach bleeding Stomach or intestine problems Take medications that treat or prevent blood clots An unusual or allergic reaction to diclofenac, other medications, foods, dyes, or preservatives Pregnant or trying to get pregnant Breast-feeding How should I use this medication? This medication is for external use only. Do not take by mouth. Wash your hands before and after use. If you are treating your hands, only wash your hands before use. Do not get it in your eyes. If you do, rinse your eyes with plenty of cool tap water. Use it as directed on the prescription label at the same time every day. Do not use it more often than directed. Keep taking it unless your care team tells you to stop. Apply a thin film of the medication to the affected area. If you are using Voltaren or Venngel One, they come with INSTRUCTIONS FOR USE. Ask your pharmacist for directions on how to use this medication.  Read the information carefully. Talk to your pharmacist or care team if you have questions. A special MedGuide will be given to you by the pharmacist with each prescription and refill. Be sure to read this information carefully each time. Talk to your care team about the use of this medication in children. Special care may be needed. Overdosage: If you think you have taken too much of this medicine contact a poison control center or emergency room at once. NOTE: This medicine is only for you. Do not share this medicine with others. What if I miss a dose? If you are using Voltaren or Venngel One: If you miss a dose, skip it. Use your next dose at the normal time. Do not use extra or 2 doses at the same time to make up for the missed dose. If you are using Solaraze: If you miss a dose, use it as soon as you can. If it is almost time for your next dose, use only that dose. Do not use double or extra doses. What may interact with this medication? Aspirin NSAIDs, medications for pain and inflammation, like ibuprofen or naproxen This list may not describe all possible interactions. Give your health care provider a list of all the medicines, herbs, non-prescription drugs, or dietary supplements you use. Also tell them if you smoke, drink alcohol, or use illegal drugs. Some items may interact with your medicine. What should I watch for while using this medication? Visit your care team for regular checks on your progress. Tell your care team if your symptoms do not start to get better or if they get worse. Do not take other medications that contain  aspirin, ibuprofen, or naproxen with this medication. Side effects such as stomach upset, nausea, or ulcers may be more likely to occur. Many non-prescription medications contain aspirin, ibuprofen, or naproxen. Always read labels carefully. This medication can cause serious ulcers and bleeding in the stomach. It can happen with no warning. Smoking, drinking  alcohol, older age, and poor health can also increase risks. Call your care team right away if you have stomach pain or blood in your vomit or stool. This medication does not prevent a heart attack or stroke. This medication may increase the chance of a heart attack or stroke. The chance may increase the longer you use this medication or if you have heart disease. If you take aspirin to prevent a heart attack or stroke, talk to your care team about using this medication. Alcohol may interfere with the effect of this medication. Avoid alcoholic drinks. This medication may cause serious skin reactions. They can happen weeks to months after starting the medication. Contact your care team right away if you notice fevers or flu-like symptoms with a rash. The rash may be red or purple and then turn into blisters or peeling of the skin. Or, you might notice a red rash with swelling of the face, lips or lymph nodes in your neck or under your arms. Talk to your care team if you are pregnant before taking this medication. Taking this medication between weeks 20 and 30 of pregnancy may harm your unborn baby. Your care team will monitor you closely if you need to take it. After 30 weeks of pregnancy, do not take this medication. You may get drowsy or dizzy. Do not drive, use machinery, or do anything that needs mental alertness until you know how this medication affects you. Do not stand up or sit up quickly, especially if you are an older patient. This reduces the risk of dizzy or fainting spells. Be careful brushing or flossing your teeth or using a toothpick because you may get an infection or bleed more easily. If you have any dental work done, tell your dentist you are receiving this medication. This medication may make it more difficult to get pregnant. Talk to your care team if you are concerned about your fertility. What side effects may I notice from receiving this medication? Side effects that you should  report to your care team as soon as possible: Allergic reactions--skin rash, itching, hives, swelling of the face, lips, tongue, or throat Bleeding--bloody or black, tar-like stools, vomiting blood or brown material that looks like coffee grounds, red or dark brown urine, small red or purple spots on skin, unusual bruising or bleeding Heart attack--pain or tightness in the chest, shoulders, arms, or jaw, nausea, shortness of breath, cold or clammy skin, feeling faint or lightheaded Heart failure--shortness of breath, swelling of ankles, feet, or hands, sudden weight gain, unusual weakness or fatigue Increase in blood pressure Kidney injury--decrease in the amount of urine, swelling of the ankles, hands, or feet Liver injury--right upper belly pain, loss of appetite, nausea, light-colored stool, dark yellow or brown urine, yellowing skin or eyes, unusual weakness or fatigue Rash, fever, and swollen lymph nodes Redness, blistering, peeling, or loosening of the skin, including inside the mouth Stroke--sudden numbness or weakness of the face, arm, or leg, trouble speaking, confusion, trouble walking, loss of balance or coordination, dizziness, severe headache, change in vision Side effects that usually do not require medical attention (report to your care team if they continue or are bothersome): Headache  Irritation at application site Loss of appetite Nausea Upset stomach This list may not describe all possible side effects. Call your doctor for medical advice about side effects. You may report side effects to FDA at 1-800-FDA-1088. Where should I keep my medication? Keep out of the reach of children and pets. Store at room temperature between 15 and 30 degrees C (59 and 86 degrees F). Do not freeze. Protect from heat. Get rid of any unused medication after the expiration date. To get rid of medications that are no longer needed or have expired: Take the medication to a medication take-back  program. Check with your pharmacy or law enforcement to find a location. If you cannot return the medication, check the label or package insert to see if the medication should be thrown out in the garbage or flushed down the toilet. If you are not sure, ask your care team. If it is safe to put it in the trash, empty the medication out of the container. Mix the medication with cat litter, dirt, coffee grounds, or other unwanted substance. Seal the mixture in a bag or container. Put it in the trash. NOTE: This sheet is a summary. It may not cover all possible information. If you have questions about this medicine, talk to your doctor, pharmacist, or health care provider.  2022 [employer]/Gold Standard (2020-10-12 00:00:00)

## 2021-03-29 NOTE — Progress Notes (Signed)
Subjective:  Patient ID: Megan Lane, female    DOB: 06/14/1955  Age: 66 y.o. MRN: [id number]  CC: Diabetes   HPI Megan Lane is a 66 y.o. year old female with a history of Type 2 Diabetes Mellitus (A1c 8.7), Diabetic neuropathy, Hyperlipidemia, Hypertension, Sciatica here for a follow-up visit.  Interval History: She was unable to obtain Trulicity as she does not have a computer at home to print out the coupon she needs due to the high cost of Trulicity.  Endorses compliance with Jardiance, metformin and glipizide. A1c is 8.7 up from 7.8.  She states sometimes thinking about the fact that she has diabetes makes her sad and she just wants to go to her room and not talk to anyone as she never wanted to have diabetes.  She does have a family history of diabetes.  She has been eating a lot of salads for the last month She is on gabapentin and Cymbalta for neuropathy.  Yesterday she had a sharp right foot pain on her dorsum which affected her walking.  She had to use ibuprofen for relief and today she is able to bear weight but she does have pain to her left dorsal.  Denies history of trauma. Her Sciatica acts up from time to time but this controlled at this time. She has been compliant with her statin and her antihypertensive. Past Medical History:  Diagnosis Date   Asthma    as a child   Diabetes mellitus    Hypercholesterolemia    Hypertension    Stroke ([place])    Ventral hernia    Wears glasses     Past Surgical History:  Procedure Laterality Date   ABDOMINAL HYSTERECTOMY     KNEE SURGERY     LEG SURGERY     left lower extremity   TUBAL LIGATION     VENTRAL HERNIA REPAIR N/A 05/29/2019   Procedure: REPAIR OF VENTRAL HERNIA WITH MESH;  Surgeon: Erroll Luna, MD;  Location: [facility];  Service: General;  Laterality: N/A;    Family History  Problem Relation Age of Onset   Diabetes Mother    Hypertension Mother    Stroke Mother    Heart disease Brother    Breast  cancer Sister 14    Allergies  Allergen Reactions   Penicillins Rash    Did it involve swelling of the face/tongue/throat, SOB, or low BP? {[id number] Did it involve sudden or severe rash/hives, skin peeling, or any reaction on the inside of your mouth or nose? Y Did you need to seek medical attention at a hospital or doctor's office? Y When did it last happen?  Several years ago     If all above answers are NO, may proceed with cephalosporin use.     Outpatient Medications Prior to Visit  Medication Sig Dispense Refill   aspirin EC 81 MG tablet Take 1 tablet (81 mg total) by mouth daily. Swallow whole. 30 tablet 11   atorvastatin (LIPITOR) 20 MG tablet Take 1 tablet (20 mg total) by mouth daily. 30 tablet 6   Blood Glucose Monitoring Suppl (CONTOUR NEXT MONITOR) w/Device KIT 1 each by Does not apply route 3 (three) times daily as needed. 1 kit 0   Dulaglutide (TRULICITY) 7.16 RC/7.8LF SOPN Inject 0.75 mg into the skin once a week. 2 mL 6   DULoxetine (CYMBALTA) 60 MG capsule Take 1 capsule (60 mg total) by mouth daily. 30 capsule 0   empagliflozin (JARDIANCE) 25 MG  TABS tablet Take 1 tablet (25 mg total) by mouth daily before breakfast. 90 tablet 1   gabapentin (NEURONTIN) 300 MG capsule Take 2 capsules (600 mg total) by mouth 3 (three) times daily. 180 capsule 6   glipiZIDE (GLUCOTROL) 10 MG tablet Take 1 tablet (10 mg total) by mouth 2 (two) times daily before a meal. 60 tablet 4   glucose blood (CONTOUR NEXT TEST) test strip Use as instructed 100 each 12   hydrochlorothiazide (HYDRODIURIL) 25 MG tablet Take 1 tablet (25 mg total) by mouth daily. 90 tablet 1   ibuprofen (ADVIL) 600 MG tablet Take 1 tablet (600 mg total) by mouth 3 (three) times daily. With food X 7 days then prn pain 60 tablet 2   Lancets (ACCU-CHEK SOFT TOUCH) lancets Use as instructed 100 each 12   metFORMIN (GLUCOPHAGE) 1000 MG tablet Take 1 tablet (1,000 mg total) by mouth 2 (two) times daily with a meal. 60  tablet 6   methocarbamol (ROBAXIN) 500 MG tablet Take 2 tablets (1,000 mg total) by mouth 2 (two) times daily as needed for muscle spasms. 120 tablet 1   No facility-administered medications prior to visit.     ROS Review of Systems  Constitutional:  Negative for activity change, appetite change and fatigue.  HENT:  Negative for congestion, sinus pressure and sore throat.   Eyes:  Negative for visual disturbance.  Respiratory:  Negative for cough, chest tightness, shortness of breath and wheezing.   Cardiovascular:  Negative for chest pain and palpitations.  Gastrointestinal:  Negative for abdominal distention, abdominal pain and constipation.  Endocrine: Negative for polydipsia.  Genitourinary:  Negative for dysuria and frequency.  Musculoskeletal:        See HPI  Skin:  Negative for rash.  Neurological:  Negative for tremors, light-headedness and numbness.  Hematological:  Does not bruise/bleed easily.  Psychiatric/Behavioral:  Negative for agitation and behavioral problems.    Objective:  BP (!) 153/95    Pulse 99    Ht 6' (1.829 m)    Wt 233 lb (105.7 kg)    SpO2 98%    BMI 31.60 kg/m   BP/Weight 03/29/2021 12/10/2020 9/67/5916  Systolic BP 384 665 993  Diastolic BP 95 88 73  Wt. (Lbs) 233 - 234  BMI 31.6 - 31.74      Physical Exam Constitutional:      Appearance: She is well-developed.  Cardiovascular:     Rate and Rhythm: Normal rate.     Heart sounds: Normal heart sounds. No murmur heard. Pulmonary:     Effort: Pulmonary effort is normal.     Breath sounds: Normal breath sounds. No wheezing or rales.  Chest:     Chest wall: No tenderness.  Abdominal:     General: Bowel sounds are normal. There is no distension.     Palpations: Abdomen is soft. There is no mass.     Tenderness: There is no abdominal tenderness.  Musculoskeletal:     Right lower leg: No edema.     Left lower leg: No edema.     Comments: Slight edema and tenderness on lateral aspect of dorsum  of right foot  Neurological:     Mental Status: She is alert and oriented to person, place, and time.  Psychiatric:        Mood and Affect: Mood normal.   Diabetic Foot Exam - Simple   Simple Foot Form Diabetic Foot exam was performed with the following findings: Yes 03/29/2021  3:46 PM  Visual Inspection See comments: Yes Sensation Testing See comments: Yes Pulse Check Posterior Tibialis and Dorsalis pulse intact bilaterally: Yes Comments Callus on medial aspect of left great toe.  No ulceration or skin breakdown      CMP Latest Ref Rng & Units 12/10/2020 06/23/2020 01/23/2020  Glucose 70 - 99 mg/dL 98 163(H) 187(H)  BUN 8 - 23 mg/dL '17 11 20  ' Creatinine 0.44 - 1.00 mg/dL 0.96 1.04(H) 1.15(H)  Sodium 135 - 145 mmol/L 140 138 141  Potassium 3.5 - 5.1 mmol/L 4.0 4.0 3.5  Chloride 98 - 111 mmol/L 108 102 100  CO2 22 - 32 mmol/L '25 26 29  ' Calcium 8.9 - 10.3 mg/dL 9.0 9.4 9.3  Total Protein 6.5 - 8.1 g/dL 7.2 - -  Total Bilirubin 0.3 - 1.2 mg/dL 0.2(L) - -  Alkaline Phos 38 - 126 U/L 114 - -  AST 15 - 41 U/L 26 - -  ALT 0 - 44 U/L 26 - -    Lipid Panel     Component Value Date/Time   CHOL 168 01/23/2020 0212   CHOL 186 09/03/2018 1359   TRIG 303 (H) 01/23/2020 0212   HDL 37 (L) 01/23/2020 0212   HDL 48 09/03/2018 1359   CHOLHDL 4.5 01/23/2020 0212   VLDL 61 (H) 01/23/2020 0212   LDLCALC 70 01/23/2020 0212   LDLCALC 94 09/03/2018 1359    CBC    Component Value Date/Time   WBC 4.8 12/10/2020 1023   RBC 4.80 12/10/2020 1023   HGB 12.8 12/10/2020 1023   HCT 40.4 12/10/2020 1023   PLT 360 12/10/2020 1023   MCV 84.2 12/10/2020 1023   MCH 26.7 12/10/2020 1023   MCHC 31.7 12/10/2020 1023   RDW 14.7 12/10/2020 1023   LYMPHSABS 2.4 12/10/2020 1023   MONOABS 0.4 12/10/2020 1023   EOSABS 0.2 12/10/2020 1023   BASOSABS 0.1 12/10/2020 1023    Lab Results  Component Value Date   HGBA1C 8.7 (A) 03/29/2021    Assessment & Plan:  1. Type 2 diabetes mellitus with  other specified complication, without long-term current use of insulin ([place]) Uncontrolled with A1c of 8.7; goal is less than 7.0 She has been out of Trulicity which could explain suboptimal control I have spoken with the clinical pharmacist who has tried to obtain a coupon for Trulicity for the patient and sent to her email Anticipate an improved A1c at her next visit in 3 months Advised to keep appointment with podiatry for treatment of calluses. - POCT glucose (manual entry) - POCT glycosylated hemoglobin (Hb A1C) - empagliflozin (JARDIANCE) 25 MG TABS tablet; Take 1 tablet (25 mg total) by mouth daily before breakfast.  Dispense: 90 tablet; Refill: 1 - Dulaglutide (TRULICITY) 4.65 KP/5.4SF SOPN; Inject 0.75 mg into the skin once a week.  Dispense: 2 mL; Refill: 6 - LP+Non-HDL Cholesterol; Future - CMP14+EGFR; Future  2. Hyperlipidemia associated with type 2 diabetes mellitus ([place]) She is due for lipid panel and this has been ordered Continue statin, low-cholesterol diet - atorvastatin (LIPITOR) 20 MG tablet; Take 1 tablet (20 mg total) by mouth daily.  Dispense: 90 tablet; Refill: 1  3. Type 2 diabetes mellitus with diabetic neuropathy, without long-term current use of insulin ([facility]) Stable - DULoxetine (CYMBALTA) 60 MG capsule; Take 1 capsule (60 mg total) by mouth daily.  Dispense: 90 capsule; Refill: 1 - gabapentin (NEURONTIN) 300 MG capsule; Take 2 capsules (600 mg total) by mouth 3 (three) times daily.  Dispense: 540 capsule; Refill: 1 - glipiZIDE (GLUCOTROL) 10 MG tablet; Take 1 tablet (10 mg total) by mouth 2 (two) times daily before a meal.  Dispense: 180 tablet; Refill: 1 - metFORMIN (GLUCOPHAGE) 1000 MG tablet; Take 1 tablet (1,000 mg total) by mouth 2 (two) times daily with a meal.  Dispense: 180 tablet; Refill: 1  4. Essential hypertension, benign Slightly above goal No regimen change today Consider switching to HCTZ/lisinopril at next visit if blood pressure is still above  goal Counseled on blood pressure goal of less than 130/80, low-sodium, DASH diet, medication compliance, 150 minutes of moderate intensity exercise per week. Discussed medication compliance, adverse effects. - hydrochlorothiazide (HYDRODIURIL) 25 MG tablet; Take 1 tablet (25 mg total) by mouth daily.  Dispense: 90 tablet; Refill: 1  5. Acute bilateral low back pain with right-sided sciatica Stable with intermittent flares Encouraged to perform back exercises - ibuprofen (ADVIL) 600 MG tablet; Take 1 tablet (600 mg total) by mouth 3 (three) times daily.  Dispense: 60 tablet; Refill: 2  6. Screening for colon cancer Referred last year but GI was unable to reach her to schedule an appointment - Ambulatory referral to Gastroenterology  7. Left foot pain No underlying history of trauma Acute symptoms x1 day Advised to apply ice, advised to apply topical Voltaren gel  No orders of the defined types were placed in this encounter.   Follow-up: Return in about 6 months (around 09/26/2021) for Chronic medical conditions.       [name], MD, FAAFP. [facility] [place], [place]   03/29/2021, 3:19 PM

## 2021-03-29 NOTE — Progress Notes (Signed)
Has not been taking her trulicity due to cost.  Handicap form.

## 2021-04-04 ENCOUNTER — Other Ambulatory Visit: Payer: BC Managed Care – PPO

## 2021-04-06 DIAGNOSIS — I639 Cerebral infarction, unspecified: Secondary | ICD-10-CM

## 2021-04-06 HISTORY — DX: Cerebral infarction, unspecified: I63.9

## 2021-04-28 ENCOUNTER — Ambulatory Visit: Payer: Self-pay | Admitting: *Deleted

## 2021-04-28 NOTE — Telephone Encounter (Signed)
?  Summary: not urinating frequent enough  ? Pt called in with the concern that she isn't urinating. Pt is requesting to speak with the nurse further. Pt says that she only urinated once yesterday and twice today. Pt says that's not normal for her.  ? ? ?Pt would like to discuss /be advised fu  ?  ? ? ? ?Chief Complaint: unable to urinate ?Symptoms: urinating drops / "squirts" of urine. Urinated x 1 yesterday and x 2 today. Left flank pain comes and goes  ?Frequency: yesterday  ?Pertinent Negatives: Patient denies pain urinating, no fever, no blood in urine  ?Disposition: '[x]'$ ED /'[]'$ Urgent Care (no appt availability in office) / '[]'$ Appointment(In office/virtual)/ '[]'$  [facility]/ '[]'$ Home Care/ '[]'$ Refused Recommended Disposition /'[]'$ [facility]/ '[]'$  Follow-up with PCP ?Additional Notes: na  ? ? ? ? ? ? ?Reason for Disposition ? [1] Unable to urinate (or only a few drops) > 4 hours AND [2] bladder feels very full (e.g., palpable bladder or strong urge to urinate) ? ?Answer Assessment - Initial Assessment Questions ?1. SEVERITY: "How bad is the pain?"  (e.g., Scale 1-10; mild, moderate, or severe) ?  - MILD (1-3): complains slightly about urination hurting ?  - MODERATE (4-7): interferes with normal activities   ?  - SEVERE (8-10): excruciating, unwilling or unable to urinate because of the pain  ?    No pain  ?2. FREQUENCY: "How many times have you had painful urination today?"  ?    no ?3. PATTERN: "Is pain present every time you urinate or just sometimes?"  ?    no ?4. ONSET: "When did the painful urination start?"  ?    No  ?5. FEVER: "Do you have a fever?" If Yes, ask: "What is your temperature, how was it measured, and when did it start?" ?    No but has had chills  ?6. PAST UTI: "Have you had a urine infection before?" If Yes, ask: "When was the last time?" and "What happened that time?"  ?    Na  ?7. CAUSE: "What do you think is causing the painful urination?"  (e.g., UTI, scratch, Herpes sore) ?     No pain only urinating drops or "squirts" of urine  ?8. OTHER SYMPTOMS: "Do you have any other symptoms?" (e.g., flank pain, vaginal discharge, genital sores, urgency, blood in urine) ?    Left flank pain , comes and goes.  ?9. PREGNANCY: "Is there any chance you are pregnant?" "When was your last menstrual period?" ?    na ? ?Protocols used: Urination Pain - Female-A-AH ? ?

## 2021-05-09 ENCOUNTER — Other Ambulatory Visit: Payer: Self-pay

## 2021-06-01 ENCOUNTER — Other Ambulatory Visit: Payer: Self-pay | Admitting: Family Medicine

## 2021-06-01 DIAGNOSIS — Z1231 Encounter for screening mammogram for malignant neoplasm of breast: Secondary | ICD-10-CM

## 2021-06-09 ENCOUNTER — Ambulatory Visit: Payer: BC Managed Care – PPO

## 2021-06-15 ENCOUNTER — Ambulatory Visit: Payer: BC Managed Care – PPO

## 2021-06-15 ENCOUNTER — Ambulatory Visit
Admission: RE | Admit: 2021-06-15 | Discharge: 2021-06-15 | Disposition: A | Discharge status: Home / Self Care | Payer: BC Managed Care – PPO | Source: Ambulatory Visit | Attending: Family Medicine | Admitting: Family Medicine

## 2021-06-15 DIAGNOSIS — Z1231 Encounter for screening mammogram for malignant neoplasm of breast: Secondary | ICD-10-CM | POA: Diagnosis not present

## 2021-06-15 IMAGING — MG MM DIGITAL SCREENING BILAT W/ TOMO AND CAD
8 series · 8 of 24 positions shown · non-contrast
Comparison: Previous exam(s).

CLINICAL DATA: Screening.

EXAM:
DIGITAL SCREENING BILATERAL MAMMOGRAM WITH TOMOSYNTHESIS AND CAD
TECHNIQUE: Bilateral screening digital craniocaudal and mediolateral oblique
mammograms were obtained. Bilateral screening digital breast
tomosynthesis was performed. The images were evaluated with
computer-aided detection.

[L CC synth-2D]
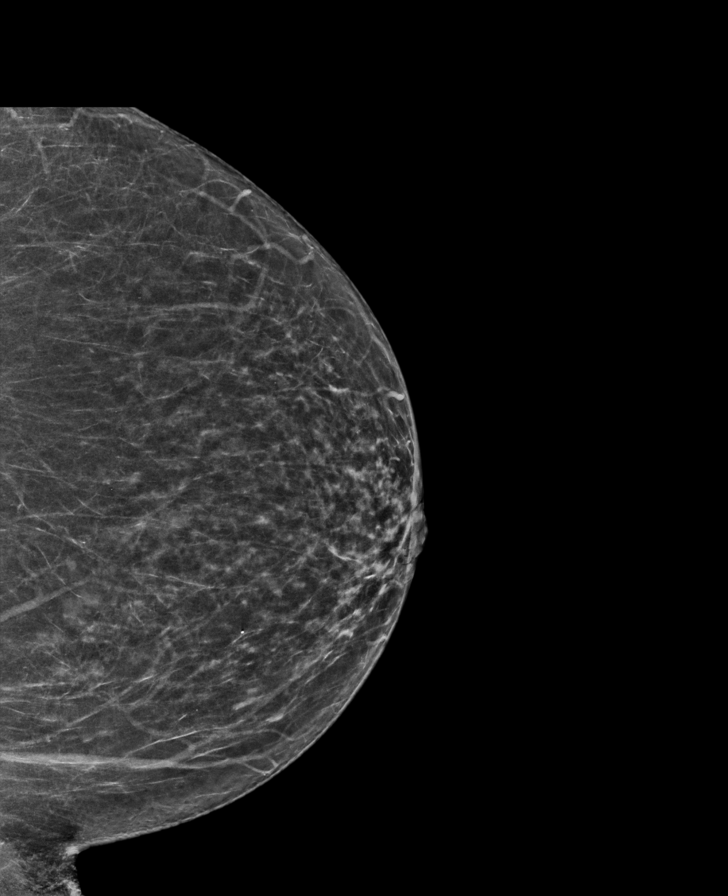

[R MLO synth-2D]
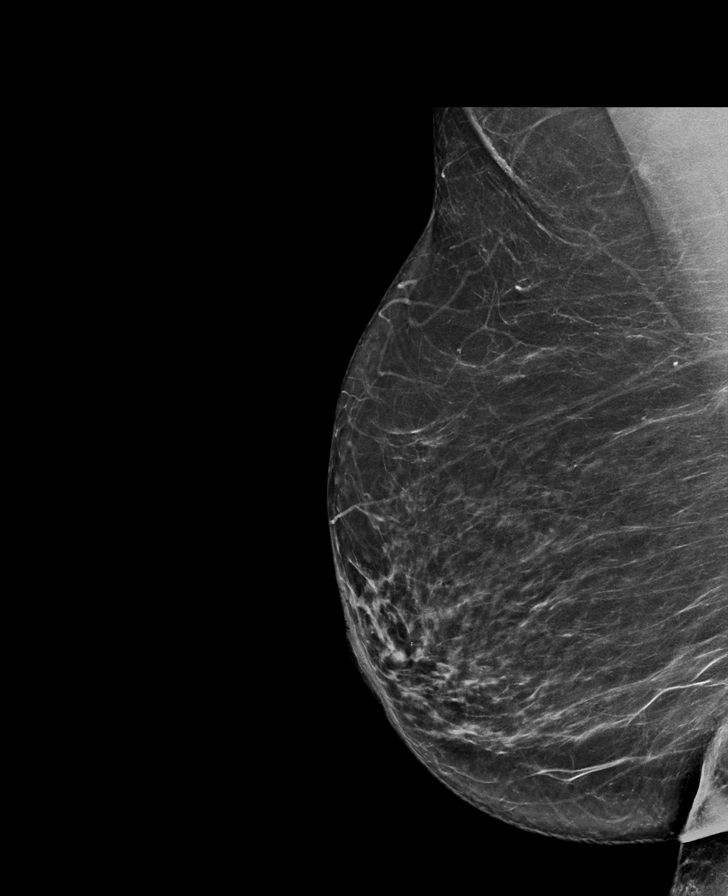

[R CC synth-2D]
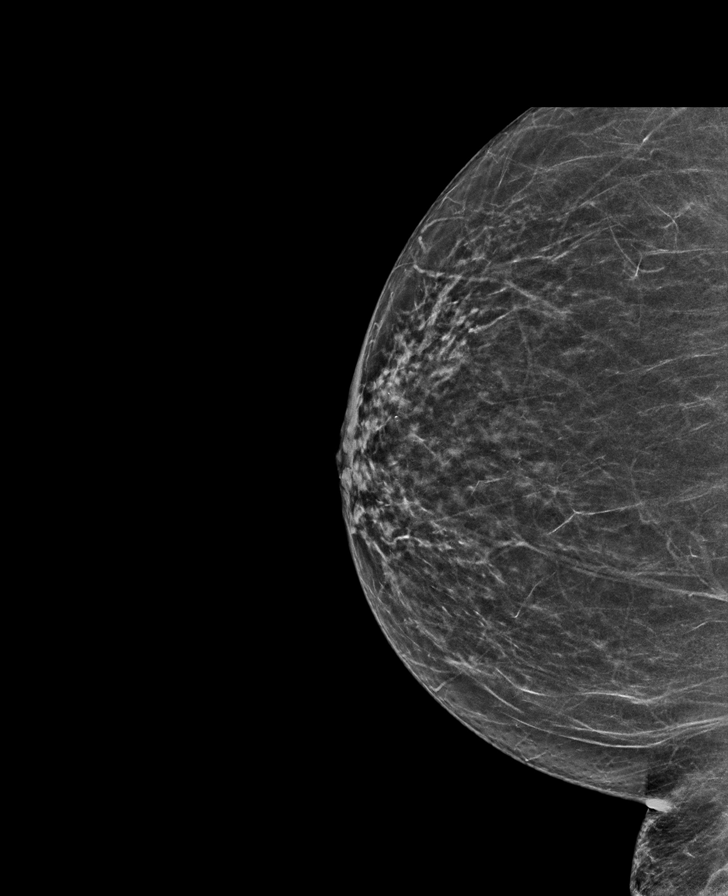

[L MLO synth-2D]
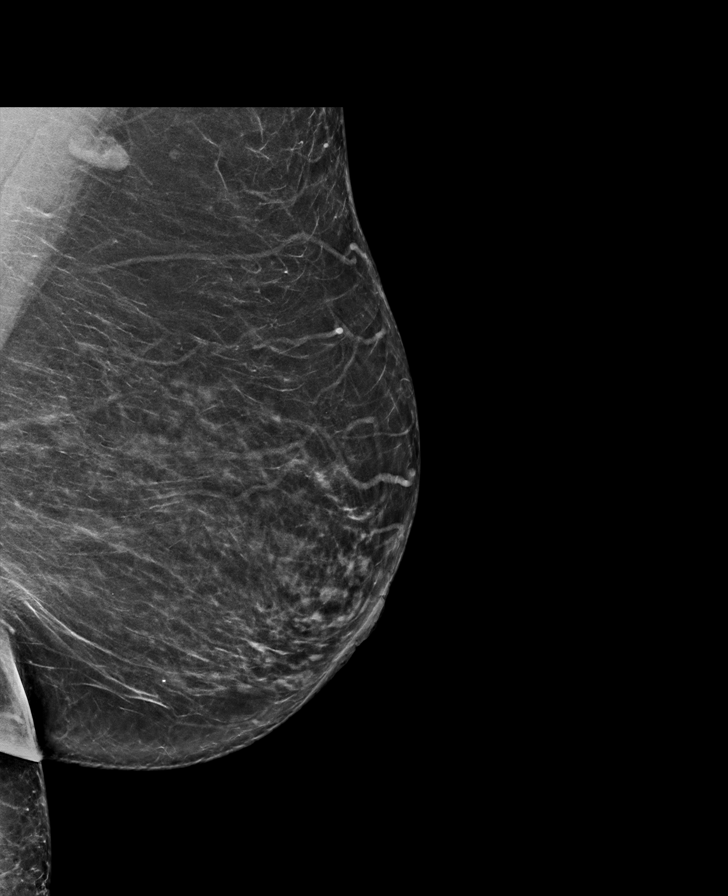

[L MLO tomo · tomo slice 37/72.0]
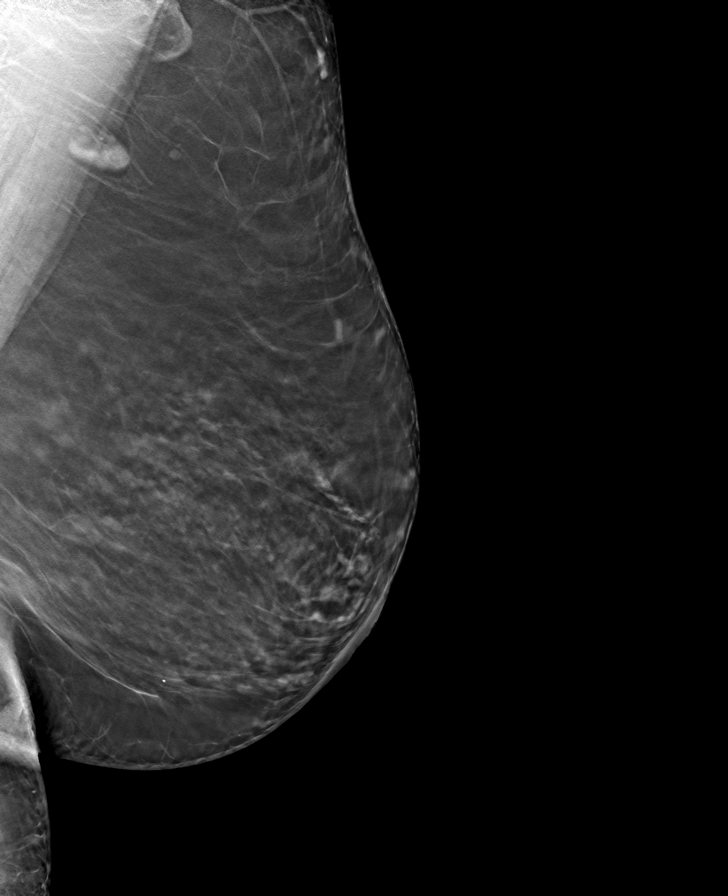

[L CC tomo · tomo slice 29/56.0]
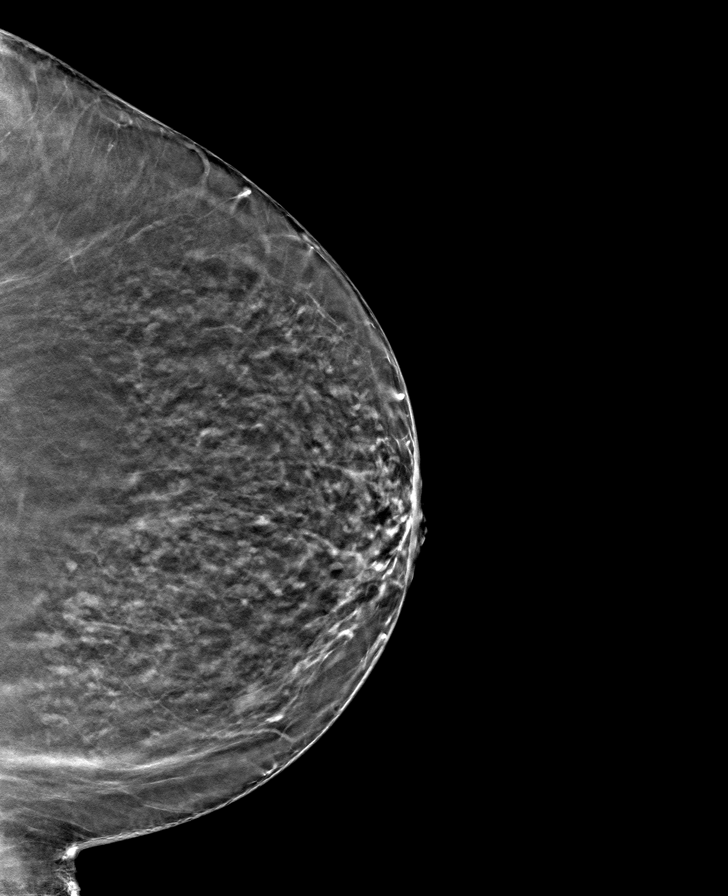

[R CC tomo · tomo slice 30/59.0]
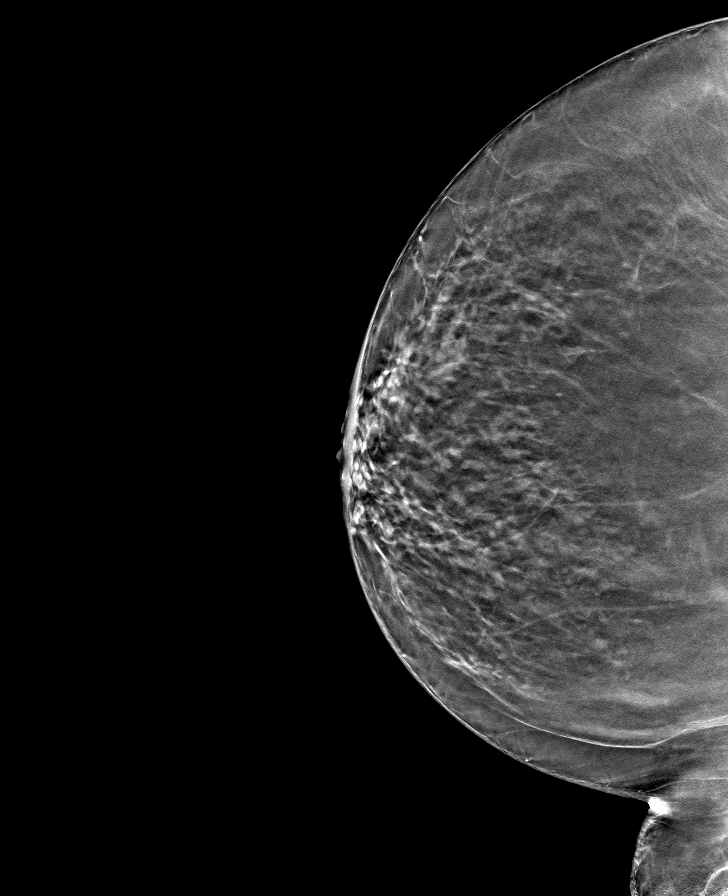

[R MLO tomo · tomo slice 37/74.0]
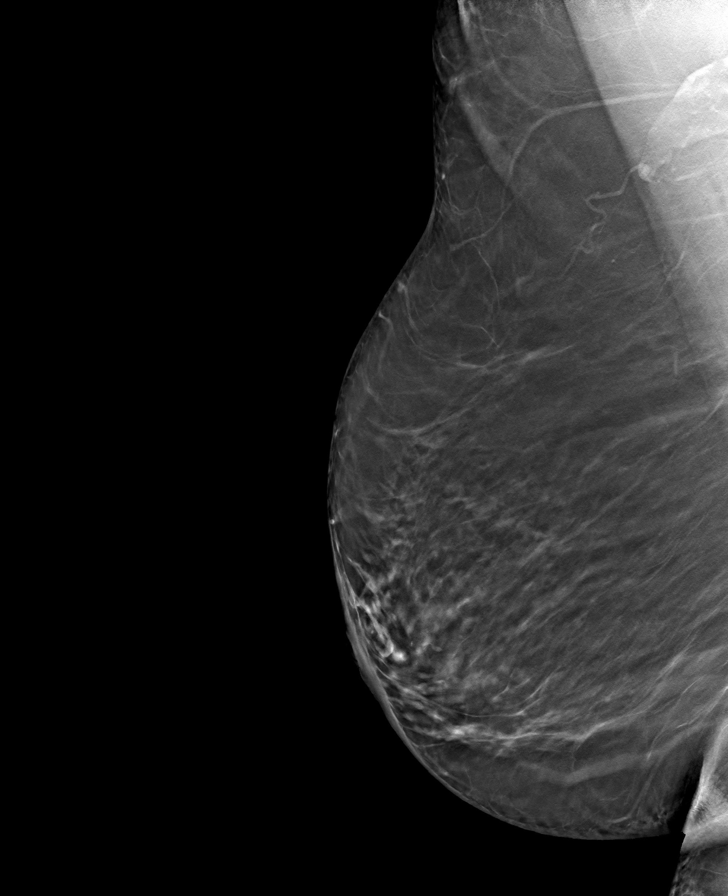

[8 of 24 positions shown; findings below may reference images not displayed]

ACR Breast Density Category b: There are scattered areas of
fibroglandular density.
FINDINGS: There are no findings suspicious for malignancy.
IMPRESSION: No mammographic evidence of malignancy. A result letter of this
screening mammogram will be mailed directly to the patient.

RECOMMENDATION:
Screening mammogram in one year. (Code:[BY])

BI-RADS CATEGORY  1: Negative.

## 2021-06-16 ENCOUNTER — Ambulatory Visit: Payer: BC Managed Care – PPO

## 2021-07-05 ENCOUNTER — Ambulatory Visit: Payer: Self-pay | Admitting: *Deleted

## 2021-07-05 NOTE — Telephone Encounter (Signed)
Patient has been called and left a VM.

## 2021-07-05 NOTE — Telephone Encounter (Signed)
Call placed to patient and VM was left informing patient that she can come in fasting any day of the week and we will get her added to the lab schedule.

## 2021-07-05 NOTE — Telephone Encounter (Signed)
Pt is requesting to speak to a nurse about lab work, has questions   Best contact: [phone]

## 2021-07-05 NOTE — Telephone Encounter (Signed)
Second attempt to reach pt, left VM to call back. 

## 2021-07-05 NOTE — Telephone Encounter (Signed)
3rd attempt to return pt's call.   Per policy this is being forwarded to [facility].  Left message to call back.

## 2021-07-05 NOTE — Telephone Encounter (Signed)
Patient called to request if she can come in for missed labs ordered in 03/29/21. Patient reports she missed appt due to transportation issues. Please advise if patient can be rescheduled for 03/29/21 labs. Patient would like a call back to schedule.    Reason for Disposition  [1] Caller requesting NON-URGENT health information AND [2] PCP's office is the best resource  Answer Assessment - Initial Assessment Questions 1. REASON FOR CALL or QUESTION: "What is your reason for calling today?" or "How can I best help you?" or "What question do you have that I can help answer?"     Patient missed lab draws scheduled on 03/29/21 due to transportation issues. Patient would like to know if she can reschedule labs and come in prior to next OV in August?  Protocols used: Information Only Call - No Triage-A-AH

## 2021-08-19 ENCOUNTER — Encounter (HOSPITAL_COMMUNITY): Payer: Self-pay | Admitting: Emergency Medicine

## 2021-08-19 ENCOUNTER — Other Ambulatory Visit: Payer: Self-pay

## 2021-08-19 ENCOUNTER — Ambulatory Visit (HOSPITAL_COMMUNITY)
Admission: EM | Admit: 2021-08-19 | Discharge: 2021-08-19 | Disposition: A | Discharge status: Home / Self Care | Payer: BC Managed Care – PPO

## 2021-08-19 ENCOUNTER — Emergency Department (HOSPITAL_COMMUNITY)
Admission: EM | Admit: 2021-08-19 | Discharge: 2021-08-20 | Disposition: A | Discharge status: Home / Self Care | Payer: BC Managed Care – PPO | Attending: Emergency Medicine | Admitting: Emergency Medicine

## 2021-08-19 ENCOUNTER — Emergency Department (HOSPITAL_COMMUNITY): Payer: BC Managed Care – PPO

## 2021-08-19 DIAGNOSIS — H11422 Conjunctival edema, left eye: Secondary | ICD-10-CM

## 2021-08-19 DIAGNOSIS — L03213 Periorbital cellulitis: Secondary | ICD-10-CM | POA: Diagnosis not present

## 2021-08-19 DIAGNOSIS — H5712 Ocular pain, left eye: Secondary | ICD-10-CM | POA: Diagnosis not present

## 2021-08-19 DIAGNOSIS — Z7982 Long term (current) use of aspirin: Secondary | ICD-10-CM | POA: Insufficient documentation

## 2021-08-19 DIAGNOSIS — H1032 Unspecified acute conjunctivitis, left eye: Secondary | ICD-10-CM | POA: Diagnosis not present

## 2021-08-19 DIAGNOSIS — H02846 Edema of left eye, unspecified eyelid: Secondary | ICD-10-CM

## 2021-08-19 LAB — CBC WITH DIFFERENTIAL/PLATELET
Abs Immature Granulocytes: 0.02 10*3/uL (ref 0.00–0.07)
Basophils Absolute: 0.1 10*3/uL (ref 0.0–0.1)
Basophils Relative: 1 %
Eosinophils Absolute: 0.2 10*3/uL (ref 0.0–0.5)
Eosinophils Relative: 2 %
HCT: 41.3 % (ref 36.0–46.0)
Hemoglobin: 13 g/dL (ref 12.0–15.0)
Immature Granulocytes: 0 %
Lymphocytes Relative: 36 %
Lymphs Abs: 3 10*3/uL (ref 0.7–4.0)
MCH: 26 pg (ref 26.0–34.0)
MCHC: 31.5 g/dL (ref 30.0–36.0)
MCV: 82.6 fL (ref 80.0–100.0)
Monocytes Absolute: 0.7 10*3/uL (ref 0.1–1.0)
Monocytes Relative: 8 %
Neutro Abs: 4.3 10*3/uL (ref 1.7–7.7)
Neutrophils Relative %: 53 %
Platelets: 335 10*3/uL (ref 150–400)
RBC: 5 MIL/uL (ref 3.87–5.11)
RDW: 14.6 % (ref 11.5–15.5)
WBC: 8.2 10*3/uL (ref 4.0–10.5)
nRBC: 0 % (ref 0.0–0.2)

## 2021-08-19 LAB — BASIC METABOLIC PANEL
Anion gap: 14 (ref 5–15)
BUN: 9 mg/dL (ref 8–23)
CO2: 23 mmol/L (ref 22–32)
Calcium: 9.3 mg/dL (ref 8.9–10.3)
Chloride: 104 mmol/L (ref 98–111)
Creatinine, Ser: 0.77 mg/dL (ref 0.44–1.00)
GFR, Estimated: >60 mL/min (ref >60)
Glucose, Bld: 231 mg/dL — ABNORMAL HIGH (ref 70–99)
Potassium: 3.7 mmol/L (ref 3.5–5.1)
Sodium: 141 mmol/L (ref 135–145)

## 2021-08-19 MED ORDER — LEVOFLOXACIN 500 MG PO TABS: 500.0000 mg | ORAL_TABLET | Freq: Every day | ORAL | 0 refills | Status: DC

## 2021-08-19 MED ORDER — IOHEXOL 300 MG/ML  SOLN
75.0000 mL | Freq: Once | INTRAMUSCULAR | Status: AC | PRN
  Administered 2021-08-19: 75 mL via INTRAVENOUS

## 2021-08-19 MED ORDER — LEVOFLOXACIN 500 MG PO TABS
500.0000 mg | ORAL_TABLET | Freq: Once | ORAL | Status: AC
  Administered 2021-08-19: 500 mg via ORAL
  Filled 2021-08-19: qty 1

## 2021-08-19 MED ORDER — ERYTHROMYCIN 5 MG/GM OP OINT
1.0000 | TOPICAL_OINTMENT | Freq: Once | OPHTHALMIC | Status: AC
  Administered 2021-08-19: 1 via OPHTHALMIC
  Filled 2021-08-19: qty 3.5

## 2021-08-19 NOTE — ED Provider Notes (Signed)
Patient presents with 3-day history of left eye swelling and pain.  Reports mucousy drainage from the eye.  Originally thought it was a stye and tried over-the-counter stye drops.  On exam patient's left eyelid is notably swollen and she cannot open her eye without pain.  Light palpation of the upper and lower lid reveals severe pain.  She has chemosis of the full left eye.  Cornea is injected.  At this time I recommend patient be seen in the emergency department for further evaluation and treatment.  Concern for orbital cellulitis and most likely needs CT scan.  Patient understands urgent care does not have the resources available to evaluate her at this time.  Her son is available to transport her to the emergency department via personal vehicle.  She is discharged in stable condition given the capabilities of this urgent care.   Kyra Leyland 08/19/21 1934

## 2021-08-19 NOTE — ED Notes (Signed)
Patient is being discharged from the Urgent Care and sent to the Emergency Department via POV . Per [employer], PA, patient is in need of higher level of care due to eye swelling and pain. Patient is aware and verbalizes understanding of plan of care.  Vitals:   08/19/21 1853  BP: (!) 168/93  Pulse: 92  Resp: 20  Temp: 98.2 F (36.8 C)  SpO2: 99%

## 2021-08-19 NOTE — Discharge Instructions (Signed)
Please fill and start taking the antibiotic tomorrow.  If you have worsening pain, worsening swelling around your eye, vision changes, persistent fevers above 101 F at home --please return to the emergency department for recheck.  If you are checking your blood sugars and they are persistently greater than 400, please return to the emergency department.  Otherwise, please follow-up with the ophthalmologist as listed.  Call their office Monday morning for an appointment.  Erythromycin  - antibiotic eye ointment  Use this medication as follows: Apply 1/4" of the antibiotic ointment to affected eye up to 6 times a day while awake for 7 days

## 2021-08-19 NOTE — ED Triage Notes (Signed)
Patient reports worsening left eye redness/swelling and drainage onset 2 days ago , denies eye injury / no vision loss.

## 2021-08-19 NOTE — ED Provider Triage Note (Signed)
Emergency Medicine Provider Triage Evaluation Note  Megan Lane , a 66 y.o. female  was evaluated in triage.  Pt complains of pain in the left eye.   She denies vision changes.  She is having pain and swelling of the left eye.   She reports that at first she thought this was a stye.  She has been using stye drops and ointments.  She wears glasses.    No fevers at home, here is 100.3 She denies any new exposures.    Physical Exam  BP (!) 185/101   Pulse 88   Temp 100.3 F (37.9 C)   Resp 16   SpO2 99%  Gen:   Awake, no distress   Resp:  Normal effort  MSK:   Moves extremities without difficulty  Other:  Normal speech, left eye is swollen.   Medical Decision Making  Medically screening exam initiated at 8:52 PM.  Appropriate orders placed.  Megan Lane was informed that the remainder of the evaluation will be completed by another provider, this initial triage assessment does not replace that evaluation, and the importance of remaining in the ED until their evaluation is complete.  With borderline temp will check basic labs.     Lorin Glass, [place] 08/19/21 2056

## 2021-08-19 NOTE — Discharge Instructions (Addendum)
D/C to ED via POV

## 2021-08-19 NOTE — ED Triage Notes (Signed)
Pt c/o left eye swelling and drainage for couple days. Thought was related to a sty so bought OTC medications and warm compress without relief.

## 2021-08-19 NOTE — ED Provider Notes (Signed)
[name]   CSN: [id number] Arrival date & time: 08/19/21  1933     History  Chief Complaint  Patient presents with   Conjunctivitis    Megan Lane is a 66 y.o. female.  Patient with history of diabetes presents to the emergency department today for evaluation of left eye pain and swelling.  She developed a couple small lumps in the upper eyelid about 2 to 3 days ago.  She thinks that these broke open.  She has had increasing swelling and drainage from the eye.  She was at urgent care and referred to the emergency department.  She has a temperature of 100.3 F on arrival.  She denies chills, vomiting, or severe headache.  No vision changes.  She denies pain unless there is movement of the eyelids or pressure placed over the eye.  Then it is very sore.  She denies history of eye surgeries.  She wears glasses.  Denies any foreign bodies or exposures.       Home Medications Prior to Admission medications   Medication Sig Start Date End Date Taking? Authorizing Provider  aspirin EC 81 MG tablet Take 1 tablet (81 mg total) by mouth daily. Swallow whole. 11/01/20   [name], MD  atorvastatin (LIPITOR) 20 MG tablet Take 1 tablet (20 mg total) by mouth daily. 03/29/21   [name], MD  Blood Glucose Monitoring Suppl (CONTOUR NEXT MONITOR) w/Device KIT 1 each by Does not apply route 3 (three) times daily as needed. 07/28/20   [name], MD  Dulaglutide (TRULICITY) 7.85 YI/5.0YD SOPN Inject 0.75 mg into the skin once a week. 03/29/21   [name], MD  DULoxetine (CYMBALTA) 60 MG capsule Take 1 capsule (60 mg total) by mouth daily. 03/29/21   [name], MD  empagliflozin (JARDIANCE) 25 MG TABS tablet Take 1 tablet (25 mg total) by mouth daily before breakfast. 03/29/21   [name], MD  gabapentin (NEURONTIN) 300 MG capsule Take 2 capsules (600 mg total) by mouth 3 (three) times daily. 03/29/21   [name], MD  glipiZIDE (GLUCOTROL) 10 MG tablet Take 1 tablet (10 mg total) by mouth 2 (two) times daily before a meal. 03/29/21   [name], MD  glucose blood (CONTOUR NEXT TEST) test strip Use as instructed 07/28/20   [name], MD  hydrochlorothiazide (HYDRODIURIL) 25 MG tablet Take 1 tablet (25 mg total) by mouth daily. 03/29/21   [name], MD  ibuprofen (ADVIL) 600 MG tablet Take 1 tablet (600 mg total) by mouth 3 (three) times daily. 03/29/21   [name], MD  Lancets (ACCU-CHEK SOFT TOUCH) lancets Use as instructed 02/12/17   Alfonse Spruce, FNP  metFORMIN (GLUCOPHAGE) 1000 MG tablet Take 1 tablet (1,000 mg total) by mouth 2 (two) times daily with a meal. 03/29/21   [name], MD  methocarbamol (ROBAXIN) 500 MG tablet Take 2 tablets (1,000 mg total) by mouth 2 (two) times daily as needed for muscle spasms. 02/23/21   [name], MD      Allergies    Penicillins    Review of Systems   Review of Systems  Physical Exam Updated Vital Signs BP (!) 185/101   Pulse 88   Temp 100.3 F (37.9 C)   Resp 16   SpO2 99%  Physical Exam Vitals and nursing note reviewed.  Constitutional:      General: She is not in acute distress.    Appearance: She is  well-developed.  HENT:     Head: Normocephalic and atraumatic.     Right Ear: External ear normal.     Left Ear: External ear normal.     Nose: Nose normal.  Eyes:     General: No visual field deficit.       Left eye: Discharge present.    Extraocular Movements:     Right eye: Normal extraocular motion.     Left eye: Normal extraocular motion.     Conjunctiva/sclera:     Right eye: Right conjunctiva is not injected. No chemosis, exudate or hemorrhage.    Left eye: Left conjunctiva is injected. Chemosis and exudate present. No hemorrhage.    Pupils: Pupils are equal, round, and reactive to light.     Right eye: Pupil is round, reactive and not sluggish.     Left eye: Pupil is round, reactive and not  sluggish.     Slit lamp exam:    Left eye: No photophobia.     Comments: Patient winces in pain when I manually open her upper and lower eyelids.  She has a white/yellow exudate.  There is also periorbital erythema.  No signs of proptosis or pupillary abnormality.  Cardiovascular:     Rate and Rhythm: Normal rate and regular rhythm.     Heart sounds: No murmur heard. Pulmonary:     Effort: No respiratory distress.     Breath sounds: No wheezing, rhonchi or rales.  Abdominal:     Palpations: Abdomen is soft.     Tenderness: There is no abdominal tenderness. There is no guarding or rebound.  Musculoskeletal:     Cervical back: Normal range of motion and neck supple.     Right lower leg: No edema.     Left lower leg: No edema.  Skin:    General: Skin is warm and dry.     Findings: No rash.  Neurological:     General: No focal deficit present.     Mental Status: She is alert. Mental status is at baseline.     Motor: No weakness.  Psychiatric:        Mood and Affect: Mood normal.     ED Results / Procedures / Treatments   Labs (all labs ordered are listed, but only abnormal results are displayed) Labs Reviewed  BASIC METABOLIC PANEL - Abnormal; Notable for the following components:      Result Value   Glucose, Bld 231 (*)    All other components within normal limits  CBC WITH DIFFERENTIAL/PLATELET    EKG None  Radiology CT Orbits W Contrast  Result Date: 08/19/2021 CLINICAL DATA:  Initial evaluation for periorbital cellulitis. EXAM: CT ORBITS WITH CONTRAST TECHNIQUE: Multidetector CT images was performed according to the standard protocol following intravenous contrast administration. RADIATION DOSE REDUCTION: This exam was performed according to the departmental dose-optimization program which includes automated exposure control, adjustment of the mA and/or kV according to patient size and/or use of iterative reconstruction technique. CONTRAST:  27m OMNIPAQUE IOHEXOL 300  MG/ML  SOLN COMPARISON:  None Available. FINDINGS: Orbits: Soft tissue swelling and stranding seen involving the left preseptal periorbital soft tissues, suspicious for acute preseptal cellulitis. No evidence for intraorbital or postseptal extension. No discrete abscess or drainable fluid collection. Globes themselves are symmetric in size with normal morphology. Optic nerves within normal limits. Extra-ocular muscles symmetric and normal. Lacrimal glands normal. Visible paranasal sinuses: Mild mucoperiosteal thickening present about the ethmoidal air cells. Visualized paranasal sinuses are otherwise  clear. Visualized mastoids and middle ear cavities are well pneumatized and free of fluid. Soft tissues: Left periorbital soft tissue swelling as above. No other acute abnormality about the visualized facial soft tissues. Osseous: No acute osseous finding. Limited intracranial: Unremarkable. IMPRESSION: Soft tissue swelling and stranding involving the left preseptal periorbital soft tissues, suspicious for acute preseptal cellulitis. No evidence for intraorbital or postseptal extension. No discrete abscess or drainable fluid collection. Electronically Signed   By: Jeannine Boga M.D.   On: 08/19/2021 23:46    Procedures Procedures    Medications Ordered in ED Medications - No data to display  ED Course/ Medical Decision Making/ A&P    Patient seen and examined. History obtained directly from patient.   Labs/EKG: Ordered CBC, BMP.  Imaging: Ordered CT of the orbits with contrast  Medications/Fluids: None ordered.  Will consider administration of IV antibiotics depending on results of CT.  Most recent vital signs reviewed and are as follows: BP (!) 185/101   Pulse 88   Temp 100.3 F (37.9 C)   Resp 16   SpO2 99%   Initial impression: Suspect that the patient had a hordeolum and now with extension of infection causing a preseptal cellulitis.  She does have a temperature of 100.3 here.   Will rule out complicated infection or orbital cellulitis with CT.  She will likely need antibiotic drops and oral antibiotics.  11:12 PM   Labs personally reviewed and interpreted including: CBC with normal WBC count, otherwise unremarkable; BMP with hyperglycemia likely reactive with normal anion gap normal kidney function.   Imaging pending.  Ordered PO levofloxacin and erythromycin ointment while awaiting imaging.   11:57 PM Reassessment performed. Patient appears stable.  Recheck temperature 99.5 F.  Imaging personally visualized and interpreted including: CT orbits with contrast demonstrates signs of preseptal cellulitis, no signs of orbital cellulitis or abscess.  Reviewed pertinent lab work and imaging with patient at bedside. Questions answered.   Most current vital signs reviewed and are as follows: BP (!) 151/88   Pulse 90   Temp 99.5 F (37.5 C) (Oral)   Resp 16   SpO2 98%   Plan: Discharge to home.  She is comfortable with trial of outpatient antibiotics and ophthalmology follow-up.  We discussed that she is higher risk because of her diabetes.  She is able to check her sugars at home.  We discussed strict return instructions including fevers at home persistently greater than 101 F, blood sugars greater than 400, worsening swelling or pain around the eye, vision changes or vision loss.  Prescriptions written for: Levofloxacin, erythromycin ointment given for home  Other home care instructions discussed: Warm compresses  ED return instructions discussed: As above  Follow-up instructions discussed: Patient encouraged to follow-up with ophthalmology/PCP in 3 days.                          Medical Decision Making Amount and/or Complexity of Data Reviewed Radiology: ordered.   Patient presents with swelling about the left eye.  She is a diabetic.  Temperature 100.3 degrees here, improved to 99.5 without treatment.  Normal white blood cell count.  EOMI.  She has  conjunctivitis with chemosis but globes are otherwise normal with normal pupillary response.  CT demonstrates preseptal cellulitis, no evidence of orbital cellulitis or abscess.  Patient's blood sugars are in the 200s.  She seems reliable to check her blood sugars at home.  She has a penicillin allergy and  was prescribed levofloxacin, first dose given in the emergency department.  Also given erythromycin ointment for her eye.  Plan and strict instruct return instructions as above.  She is moderate risk for return given comorbidities -- but is comfortable with trial of therapy at home and states she understands and will return with worsening.   The patient's vital signs, pertinent lab work and imaging were reviewed and interpreted as discussed in the ED course. Hospitalization was considered for further testing, treatments, or serial exams/observation. However as patient is well-appearing, has a stable exam, and reassuring studies today, I do not feel that they warrant admission at this time. This plan was discussed with the patient who verbalizes agreement and comfort with this plan and seems reliable and able to return to the Emergency Department with worsening or changing symptoms.          Final Clinical Impression(s) / ED Diagnoses Final diagnoses:  Preseptal cellulitis of left eye  Acute conjunctivitis of left eye, unspecified acute conjunctivitis type    Rx / DC Orders ED Discharge Orders          Ordered    levofloxacin (LEVAQUIN) 500 MG tablet  Daily        08/19/21 2355              Carlisle Cater, PA-C 08/20/21 0001    Lajean Saver, MD 08/20/21 [phone]

## 2021-08-26 ENCOUNTER — Ambulatory Visit: Payer: BC Managed Care – PPO | Attending: Family Medicine

## 2021-08-26 DIAGNOSIS — E1169 Type 2 diabetes mellitus with other specified complication: Secondary | ICD-10-CM

## 2021-08-27 LAB — CMP14+EGFR
ALT: 18 IU/L (ref 0–32)
AST: 16 IU/L (ref 0–40)
Albumin/Globulin Ratio: 1.4 (ref 1.2–2.2)
Albumin: 4.2 g/dL (ref 3.9–4.9)
Alkaline Phosphatase: 148 IU/L — ABNORMAL HIGH (ref 44–121)
BUN/Creatinine Ratio: 10 — ABNORMAL LOW (ref 12–28)
BUN: 10 mg/dL (ref 8–27)
Bilirubin Total: <0.2 mg/dL (ref 0.0–1.2)
CO2: 20 mmol/L (ref 20–29)
Calcium: 9.5 mg/dL (ref 8.7–10.3)
Chloride: 102 mmol/L (ref 96–106)
Creatinine, Ser: 0.99 mg/dL (ref 0.57–1.00)
Globulin, Total: 2.9 g/dL (ref 1.5–4.5)
Glucose: 275 mg/dL — ABNORMAL HIGH (ref 70–99)
Potassium: 3.8 mmol/L (ref 3.5–5.2)
Sodium: 142 mmol/L (ref 134–144)
Total Protein: 7.1 g/dL (ref 6.0–8.5)
eGFR: 64 mL/min/{1.73_m2} (ref >59)

## 2021-08-27 LAB — LP+NON-HDL CHOLESTEROL
Cholesterol, Total: 252 mg/dL — ABNORMAL HIGH (ref 100–199)
HDL: 49 mg/dL (ref >39)
LDL Chol Calc (NIH): 135 mg/dL — ABNORMAL HIGH (ref 0–99)
Total Non-HDL-Chol (LDL+VLDL): 203 mg/dL — ABNORMAL HIGH (ref 0–129)
Triglycerides: 374 mg/dL — ABNORMAL HIGH (ref 0–149)
VLDL Cholesterol Cal: 68 mg/dL — ABNORMAL HIGH (ref 5–40)

## 2021-09-02 ENCOUNTER — Ambulatory Visit: Payer: Self-pay

## 2021-09-02 NOTE — Telephone Encounter (Signed)
Patient called, left VM to return the call to the office to discuss symptoms with a nurse.

## 2021-09-02 NOTE — Telephone Encounter (Signed)
Patient called, left VM to return the call to the office to discuss symptoms with a nurse.  Summary: Muscle spasms   Muscle spasms, no new or worsening symptoms.  Best contact: [phone]

## 2021-09-05 NOTE — Telephone Encounter (Signed)
Called patient and she is aware of Doctor's note

## 2021-09-05 NOTE — Telephone Encounter (Signed)
Yes she can take Robaxin.

## 2021-09-05 NOTE — Telephone Encounter (Signed)
Pt wanting refill on robaxin, which she has a refill at the pharmacy. But wanting to now because of her last lab results she asking if she can still take it ?

## 2021-09-09 ENCOUNTER — Telehealth: Payer: Self-pay | Admitting: Emergency Medicine

## 2021-09-09 DIAGNOSIS — G8929 Other chronic pain: Secondary | ICD-10-CM

## 2021-09-09 MED ORDER — METHOCARBAMOL 500 MG PO TABS: 1000.0000 mg | ORAL_TABLET | Freq: Two times a day (BID) | ORAL | 0 refills | Status: DC | PRN

## 2021-09-09 NOTE — Addendum Note (Signed)
Addended by: Daisy Blossom, Annie Main L on: 09/09/2021 04:52 PM   Modules accepted: Orders

## 2021-09-09 NOTE — Telephone Encounter (Signed)
Rx refill sent. Pt called and informed.

## 2021-09-09 NOTE — Telephone Encounter (Signed)
Copied from [place] [id number]. Topic: General - Other >> Sep 09, 2021  1:14 PM [name] wrote: Reason for CRM: The patient has called to follow up on their previous request for a refill of methocarbamol (ROBAXIN) 500 MG tablet [[id number]]   Please contact the patient further when possible

## 2021-09-12 ENCOUNTER — Other Ambulatory Visit: Payer: Self-pay | Admitting: Family Medicine

## 2021-09-12 DIAGNOSIS — G8929 Other chronic pain: Secondary | ICD-10-CM

## 2021-09-12 MED ORDER — METHOCARBAMOL 500 MG PO TABS: 1000.0000 mg | ORAL_TABLET | Freq: Two times a day (BID) | ORAL | 0 refills | Status: DC | PRN

## 2021-09-12 NOTE — Telephone Encounter (Signed)
Requested medication (s) are due for refill today:Yes  Requested medication (s) are on the active medication list: Yes  Last refill:  09/09/21 to the wrong pharmacy  Future visit scheduled: Yes  Notes to clinic:  Unable to refill per protocol, cannot delegate. Please resend to correct pharmacy and call patient, see notes.     Requested Prescriptions  Pending Prescriptions Disp Refills   methocarbamol (ROBAXIN) 500 MG tablet 120 tablet 0    Sig: Take 2 tablets (1,000 mg total) by mouth 2 (two) times daily as needed for muscle spasms.     Not Delegated - Analgesics:  Muscle Relaxants Failed - 09/12/2021 11:11 AM      Failed - This refill cannot be delegated      Passed - Valid encounter within last 6 months    Recent Outpatient Visits           5 months ago Type 2 diabetes mellitus with other specified complication, without long-term current use of insulin ([place])   [place], [place], MD   10 months ago Type 2 diabetes mellitus with other specified complication, without long-term current use of insulin ([place])   [place], [place], MD   1 year ago Type 2 diabetes mellitus with other specified complication, without long-term current use of insulin ([place])   [place], [place], MD   1 year ago Type 2 diabetes mellitus with other specified complication, without long-term current use of insulin ([place])   [place], [place], MD   1 year ago Type 2 diabetes mellitus with diabetic neuropathy, without long-term current use of insulin ([place])   [place], MD       Future Appointments             In 2 weeks [name], MD [place]

## 2021-09-12 NOTE — Telephone Encounter (Signed)
Pts refill for methocarbamol (ROBAXIN) 500 MG tablet  was sent to [facility] but it should have been sent to [facility] / please resend today to [place] ([place] - [address]/ please advise pt when this has been sent

## 2021-09-13 ENCOUNTER — Telehealth: Payer: Self-pay | Admitting: Emergency Medicine

## 2021-09-13 NOTE — Telephone Encounter (Signed)
Copied from [place] [id number]. Topic: General - Call Back - No Documentation >> Sep 09, 2021  4:57 PM Ja-Kwan M wrote: Reason for CRM: Pt stated she received a call from the nurse but she needs to speak back to that nurse. Cb# [phone]

## 2021-09-13 NOTE — Telephone Encounter (Signed)
Call placed to patient and VM was left informing patient to return phone call. 

## 2021-09-16 ENCOUNTER — Other Ambulatory Visit: Payer: Self-pay | Admitting: Family Medicine

## 2021-09-16 DIAGNOSIS — E785 Hyperlipidemia, unspecified: Secondary | ICD-10-CM

## 2021-09-16 DIAGNOSIS — I10 Essential (primary) hypertension: Secondary | ICD-10-CM

## 2021-09-16 DIAGNOSIS — E1169 Type 2 diabetes mellitus with other specified complication: Secondary | ICD-10-CM

## 2021-09-16 DIAGNOSIS — E1140 Type 2 diabetes mellitus with diabetic neuropathy, unspecified: Secondary | ICD-10-CM

## 2021-09-16 DIAGNOSIS — M5441 Lumbago with sciatica, right side: Secondary | ICD-10-CM

## 2021-09-16 MED ORDER — GABAPENTIN 300 MG PO CAPS: 600.0000 mg | ORAL_CAPSULE | Freq: Three times a day (TID) | ORAL | 0 refills | Status: DC

## 2021-09-16 MED ORDER — CONTOUR NEXT TEST VI STRP: ORAL_STRIP | 2 refills | Status: AC

## 2021-09-16 MED ORDER — CONTOUR NEXT MONITOR W/DEVICE KIT: 1.0000 | PACK | Freq: Three times a day (TID) | 0 refills | Status: DC | PRN

## 2021-09-16 MED ORDER — IBUPROFEN 600 MG PO TABS: 600.0000 mg | ORAL_TABLET | Freq: Three times a day (TID) | ORAL | 2 refills | Status: DC

## 2021-09-16 MED ORDER — HYDROCHLOROTHIAZIDE 25 MG PO TABS: 25.0000 mg | ORAL_TABLET | Freq: Every day | ORAL | 0 refills | Status: DC

## 2021-09-16 MED ORDER — DULOXETINE HCL 60 MG PO CPEP: 60.0000 mg | ORAL_CAPSULE | Freq: Every day | ORAL | 0 refills | Status: DC

## 2021-09-16 MED ORDER — ATORVASTATIN CALCIUM 20 MG PO TABS: 20.0000 mg | ORAL_TABLET | Freq: Every day | ORAL | 1 refills | Status: DC

## 2021-09-16 MED ORDER — TRULICITY 0.75 MG/0.5ML ~~LOC~~ SOAJ: 0.7500 mg | SUBCUTANEOUS | 2 refills | Status: DC

## 2021-09-16 MED ORDER — METFORMIN HCL 1000 MG PO TABS: 1000.0000 mg | ORAL_TABLET | Freq: Two times a day (BID) | ORAL | 0 refills | Status: DC

## 2021-09-16 MED ORDER — GLIPIZIDE 10 MG PO TABS: 10.0000 mg | ORAL_TABLET | Freq: Two times a day (BID) | ORAL | 0 refills | Status: DC

## 2021-09-16 MED ORDER — EMPAGLIFLOZIN 25 MG PO TABS: 25.0000 mg | ORAL_TABLET | Freq: Every day | ORAL | 0 refills | Status: DC

## 2021-09-16 NOTE — Telephone Encounter (Signed)
Requested Prescriptions  Pending Prescriptions Disp Refills  . metFORMIN (GLUCOPHAGE) 1000 MG tablet 180 tablet 0    Sig: Take 1 tablet (1,000 mg total) by mouth 2 (two) times daily with a meal.     Endocrinology:  Diabetes - Biguanides Failed - 09/16/2021  4:51 PM      Failed - HBA1C is between 0 and 7.9 and within 180 days    HbA1c, POC (controlled diabetic range)  Date Value Ref Range Status  03/29/2021 8.7 (A) 0.0 - 7.0 % Final         Failed - B12 Level in normal range and within 720 days    No results found for: "VITAMINB12"       Passed - Cr in normal range and within 360 days    Creat  Date Value Ref Range Status  02/09/2015 1.00 0.50 - 1.05 mg/dL Final   Creatinine, Ser  Date Value Ref Range Status  08/26/2021 0.99 0.57 - 1.00 mg/dL Final   Creatinine, POC  Date Value Ref Range Status  12/04/2016 30 mg/dL Final         Passed - eGFR in normal range and within 360 days    GFR, Est African American  Date Value Ref Range Status  08/12/2014 >89 mL/min Final   GFR calc Af Amer  Date Value Ref Range Status  08/18/2019 64 >59 mL/min/1.73 Final    Comment:    **[facility] currently reports eGFR in compliance with the current**   recommendations of the [employer]. [facility] will   update reporting as new guidelines are published from the NKF-ASN   Task force.    GFR, Est Non African American  Date Value Ref Range Status  08/12/2014 82 mL/min Final    Comment:      The estimated GFR is a calculation valid for adults (>=26 years old) that uses the CKD-EPI algorithm to adjust for age and sex. It is   not to be used for children, pregnant women, hospitalized patients,    patients on dialysis, or with rapidly changing kidney function. According to the NKDEP, eGFR >89 is normal, 60-89 shows mild impairment, 30-59 shows moderate impairment, 15-29 shows severe impairment and <15 is ESRD.      GFR, Estimated  Date Value Ref Range Status  08/19/2021 >60  >60 mL/min Final    Comment:    (NOTE) Calculated using the CKD-EPI Creatinine Equation (2021)    eGFR  Date Value Ref Range Status  08/26/2021 64 >59 mL/min/1.73 Final         Passed - Valid encounter within last 6 months    Recent Outpatient Visits          5 months ago Type 2 diabetes mellitus with other specified complication, without long-term current use of insulin ([place])   Barnum, [place], MD   10 months ago Type 2 diabetes mellitus with other specified complication, without long-term current use of insulin ([place])   McCamey, Hurtsboro, MD   1 year ago Type 2 diabetes mellitus with other specified complication, without long-term current use of insulin ([place])   Westboro, Kosciusko, MD   1 year ago Type 2 diabetes mellitus with other specified complication, without long-term current use of insulin ([place])   [place], Charlane Ferretti, MD   1 year ago Type 2 diabetes mellitus with diabetic neuropathy, without long-term  current use of insulin ([place])   [place], Woodland, MD      Future Appointments            In 1 week [name], MD [place]   In 2 months [place], [place], MD [place] [facility] [place] within normal limits and completed in the last 12 months    WBC  Date Value Ref Range Status  08/19/2021 8.2 4.0 - 10.5 K/uL Final   RBC  Date Value Ref Range Status  08/19/2021 5.00 3.87 - 5.11 MIL/uL Final   Hemoglobin  Date Value Ref Range Status  08/19/2021 13.0 12.0 - 15.0 g/dL Final   HCT  Date Value Ref Range Status  08/19/2021 41.3 36.0 - 46.0 % Final   MCHC  Date Value Ref Range Status  08/19/2021 31.5 30.0 - 36.0 g/dL Final   [facility]  Date Value Ref Range Status  08/19/2021 26.0 26.0 - 34.0 pg Final    MCV  Date Value Ref Range Status  08/19/2021 82.6 80.0 - 100.0 fL Final   No results found for: "PLTCOUNTKUC", "LABPLAT", "POCPLA" RDW  Date Value Ref Range Status  08/19/2021 14.6 11.5 - 15.5 % Final         . ibuprofen (ADVIL) 600 MG tablet 60 tablet 2    Sig: Take 1 tablet (600 mg total) by mouth 3 (three) times daily.     Analgesics:  NSAIDS Failed - 09/16/2021  4:51 PM      Failed - Manual Review: Labs are only required if the patient has taken medication for more than 8 weeks.      Passed - Cr in normal range and within 360 days    Creat  Date Value Ref Range Status  02/09/2015 1.00 0.50 - 1.05 mg/dL Final   Creatinine, Ser  Date Value Ref Range Status  08/26/2021 0.99 0.57 - 1.00 mg/dL Final   Creatinine, POC  Date Value Ref Range Status  12/04/2016 30 mg/dL Final         Passed - HGB in normal range and within 360 days    Hemoglobin  Date Value Ref Range Status  08/19/2021 13.0 12.0 - 15.0 g/dL Final         Passed - PLT in normal range and within 360 days    Platelets  Date Value Ref Range Status  08/19/2021 335 150 - 400 K/uL Final         Passed - HCT in normal range and within 360 days    HCT  Date Value Ref Range Status  08/19/2021 41.3 36.0 - 46.0 % Final         Passed - eGFR is 30 or above and within 360 days    GFR, Est African American  Date Value Ref Range Status  08/12/2014 >89 mL/min Final   GFR calc Af Amer  Date Value Ref Range Status  08/18/2019 64 >59 mL/min/1.73 Final    Comment:    **[facility] currently reports eGFR in compliance with the current**   recommendations of the [employer]. [facility] will   update reporting as new guidelines are published from the NKF-ASN   Task force.    GFR, Est Non African American  Date Value Ref Range Status  08/12/2014 82 mL/min Final    Comment:      The estimated GFR  is a calculation valid for adults (>=58 years old) that uses the CKD-EPI algorithm to adjust for age  and sex. It is   not to be used for children, pregnant women, hospitalized patients,    patients on dialysis, or with rapidly changing kidney function. According to the NKDEP, eGFR >89 is normal, 60-89 shows mild impairment, 30-59 shows moderate impairment, 15-29 shows severe impairment and <15 is ESRD.      GFR, Estimated  Date Value Ref Range Status  08/19/2021 >60 >60 mL/min Final    Comment:    (NOTE) Calculated using the CKD-EPI Creatinine Equation (2021)    eGFR  Date Value Ref Range Status  08/26/2021 64 >59 mL/min/1.73 Final         Passed - Patient is not pregnant      Passed - Valid encounter within last 12 months    Recent Outpatient Visits          5 months ago Type 2 diabetes mellitus with other specified complication, without long-term current use of insulin ([place])   [place]undee, Breckinridge [place], MD   10 months ago Type 2 diabetes mellitus with other specified complication, without long-term current use of insulin ([place])   [place]ahiawa, Freeman Sp[place], MD   1 year ago Type 2 diabetes mellitus with other specified complication, without long-term current use of insulin ([place])   [place], Hobart, MD   1 year ago Type 2 diabetes mellitus with other specified complication, without long-term current use of insulin ([place])   Osyka, Dublin, MD   1 year ago Type 2 diabetes mellitus with diabetic neuropathy, without long-term current use of insulin ([place])   [place], Charlane Ferretti, MD      Future Appointments            In 1 week [name], MD [place]   In 2 months [place], Ines Bloomer, MD [place] [facility] [place]           . hydrochlorothiazide (HYDRODIURIL) 25 MG tablet 90 tablet 0    Sig: Take 1 tablet (25 mg total) by mouth daily.      Cardiovascular: Diuretics - Thiazide Failed - 09/16/2021  4:51 PM      Failed - Last BP in normal range    BP Readings from Last 1 Encounters:  08/19/21 (!) 151/88         Passed - Cr in normal range and within 180 days    Creat  Date Value Ref Range Status  02/09/2015 1.00 0.50 - 1.05 mg/dL Final   Creatinine, Ser  Date Value Ref Range Status  08/26/2021 0.99 0.57 - 1.00 mg/dL Final   Creatinine, POC  Date Value Ref Range Status  12/04/2016 30 mg/dL Final         Passed - K in normal range and within 180 days    Potassium  Date Value Ref Range Status  08/26/2021 3.8 3.5 - 5.2 mmol/L Final         Passed - Na in normal range and within 180 days    Sodium  Date Value Ref Range Status  08/26/2021 142 134 - 144 mmol/L Final         Passed - Valid encounter within last 6 months    Recent Outpatient Visits  5 months ago Type 2 diabetes mellitus with other specified complication, without long-term current use of insulin ([place])   Cooke, Charlevoix, MD   10 months ago Type 2 diabetes mellitus with other specified complication, without long-term current use of insulin ([place])   Charleroi, Oreland, MD   1 year ago Type 2 diabetes mellitus with other specified complication, without long-term current use of insulin ([place])   Hondo, Charlane Ferretti, MD   1 year ago Type 2 diabetes mellitus with other specified complication, without long-term current use of insulin ([place])   Richland, Charlane Ferretti, MD   1 year ago Type 2 diabetes mellitus with diabetic neuropathy, without long-term current use of insulin ([place])   [place], Enobong, MD      Future Appointments            In 1 week [name], MD [place]   In 2 months Omar, Ines Bloomer, MD [place] at [employer]           . glucose blood (CONTOUR NEXT TEST) test strip 100 each 2    Sig: Use as instructed     Endocrinology: Diabetes - Testing Supplies Passed - 09/16/2021  4:51 PM      Passed - Valid encounter within last 12 months    Recent Outpatient Visits          5 months ago Type 2 diabetes mellitus with other specified complication, without long-term current use of insulin ([place])   Salvis[place], [place]port, MD   10 months ago Type 2 diabetes mellitus with other specified complication, without long-term current use of insulin ([place])   Cylinder, Clark [place]ork, MD   1 year ago Type 2 diabetes mellitus with other specified complication, without long-term current use of insulin ([place])   Hartwell, Salem, MD   1 year ago Type 2 diabetes mellitus with other specified complication, without long-term current use of insulin ([place])   Heil, [place], MD   1 year ago Type 2 diabetes mellitus with diabetic neuropathy, without long-term current use of insulin ([place])   [place], Enobong, MD      Future Appointments            In 1 week [name], MD [place]   In 2 months [place], Ines Bloomer, MD [place] at [place]           . glipiZIDE (GLUCOTROL) 10 MG tablet 180 tablet 0    Sig: Take 1 tablet (10 mg total) by mouth 2 (two) times daily before a meal.     Endocrinology:  Diabetes - Sulfonylureas Failed - 09/16/2021  4:51 PM      Failed - HBA1C is between 0 and 7.9 and within 180 days    HbA1c, POC (controlled diabetic range)  Date Value Ref Range Status  03/29/2021 8.7 (A) 0.0 - 7.0 % Final         Passed - Cr in normal range and within 360 days    Creat  Date Value Ref Range Status  02/09/2015 1.00 0.50 - 1.05 mg/dL Final    Creatinine, Ser  Date  Value Ref Range Status  08/26/2021 0.99 0.57 - 1.00 mg/dL Final   Creatinine, POC  Date Value Ref Range Status  12/04/2016 30 mg/dL Final         Passed - Valid encounter within last 6 months    Recent Outpatient Visits          5 months ago Type 2 diabetes mellitus with other specified complication, without long-term current use of insulin ([place])   Hurdland, Leavenworth, MD   10 months ago Type 2 diabetes mellitus with other specified complication, without long-term current use of insulin ([place])   Plato, Mertens, MD   1 year ago Type 2 diabetes mellitus with other specified complication, without long-term current use of insulin ([place])   Krupp, Algood, MD   1 year ago Type 2 diabetes mellitus with other specified complication, without long-term current use of insulin ([place])   [place], [place] Tim[place], MD   1 year ago Type 2 diabetes mellitus with diabetic neuropathy, without long-term current use of insulin ([place])   [place]oledo, MD      Future Appointments            In 1 week [name], MD [place]   In 2 months Crestone, Ines Bloomer, MD [place] at [place]           . gabapentin (NEURONTIN) 300 MG capsule 540 capsule 0    Sig: Take 2 capsules (600 mg total) by mouth 3 (three) times daily.     Neurology: Anticonvulsants - gabapentin Passed - 09/16/2021  4:51 PM      Passed - Cr in normal range and within 360 days    Creat  Date Value Ref Range Status  02/09/2015 1.00 0.50 - 1.05 mg/dL Final   Creatinine, Ser  Date Value Ref Range Status  08/26/2021 0.99 0.57 - 1.00 mg/dL Final   Creatinine, POC  Date Value Ref Range Status  12/04/2016 30 mg/dL Final         Passed - Completed PHQ-2 or PHQ-9 in  the last 360 days      Passed - Valid encounter within last 12 months    Recent Outpatient Visits          5 months ago Type 2 diabetes mellitus with other specified complication, without long-term current use of insulin ([place])   Y[place] Valley, Ophir, MD   10 months ago Type 2 diabetes mellitus with other specified complication, without long-term current use of insulin ([place])   [place], Riverdale, MD   1 year ago Type 2 diabetes mellitus with other specified complication, without long-term current use of insulin ([place])   Austin, Six Mile, MD   1 year ago Type 2 diabetes mellitus with other specified complication, without long-term current use of insulin ([place])   [place] Delta, Redkey, MD   1 year ago Type 2 diabetes mellitus with diabetic neuropathy, without long-term current use of insulin ([place])   [place], Enobong, MD      Future Appointments            In 1 week [name], MD [place]  In 2 months Hoberg, Ines Bloomer, MD [facility] at [facility]           . empagliflozin (JARDIANCE) 25 MG TABS tablet 90 tablet 0    Sig: Take 1 tablet (25 mg total) by mouth daily before breakfast.     Endocrinology:  Diabetes - SGLT2 Inhibitors Failed - 09/16/2021  4:51 PM      Failed - HBA1C is between 0 and 7.9 and within 180 days    HbA1c, POC (controlled diabetic range)  Date Value Ref Range Status  03/29/2021 8.7 (A) 0.0 - 7.0 % Final         Passed - Cr in normal range and within 360 days    Creat  Date Value Ref Range Status  02/09/2015 1.00 0.50 - 1.05 mg/dL Final   Creatinine, Ser  Date Value Ref Range Status  08/26/2021 0.99 0.57 - 1.00 mg/dL Final   Creatinine, POC  Date Value Ref Range Status  12/04/2016 30 mg/dL Final         Passed  - eGFR in normal range and within 360 days    GFR, Est African American  Date Value Ref Range Status  08/12/2014 >89 mL/min Final   GFR calc Af Amer  Date Value Ref Range Status  08/18/2019 64 >59 mL/min/1.73 Final    Comment:    **[facility] currently reports eGFR in compliance with the current**   recommendations of the [employer]. [facility] will   update reporting as new guidelines are published from the NKF-ASN   Task force.    GFR, Est Non African American  Date Value Ref Range Status  08/12/2014 82 mL/min Final    Comment:      The estimated GFR is a calculation valid for adults (>=30 years old) that uses the CKD-EPI algorithm to adjust for age and sex. It is   not to be used for children, pregnant women, hospitalized patients,    patients on dialysis, or with rapidly changing kidney function. According to the NKDEP, eGFR >89 is normal, 60-89 shows mild impairment, 30-59 shows moderate impairment, 15-29 shows severe impairment and <15 is ESRD.      GFR, Estimated  Date Value Ref Range Status  08/19/2021 >60 >60 mL/min Final    Comment:    (NOTE) Calculated using the CKD-EPI Creatinine Equation (2021)    eGFR  Date Value Ref Range Status  08/26/2021 64 >59 mL/min/1.73 Final         Passed - Valid encounter within last 6 months    Recent Outpatient Visits          5 months ago Type 2 diabetes mellitus with other specified complication, without long-term current use of insulin ([place])   [place], Flomaton, MD   10 months ago Type 2 diabetes mellitus with other specified complication, without long-term current use of insulin ([place])   Bressler, Potomac, MD   1 year ago Type 2 diabetes mellitus with other specified complication, without long-term current use of insulin ([place])   Hunters Hollow, Liberty Hill, MD   1 year ago Type 2 diabetes mellitus with  other specified complication, without long-term current use of insulin ([place])   Perry, [place], MD   1 year ago Type 2 diabetes mellitus with diabetic neuropathy, without long-term current use of insulin ([place])   [place]  [facility] [name], MD      Future Appointments            In 1 week [name], MD [place]   In 2 months Stem, Ines Bloomer, MD [place] at [place]           . DULoxetine (CYMBALTA) 60 MG capsule 90 capsule 0    Sig: Take 1 capsule (60 mg total) by mouth daily.     Psychiatry: Antidepressants - SNRI - duloxetine Failed - 09/16/2021  4:51 PM      Failed - Last BP in normal range    BP Readings from Last 1 Encounters:  08/19/21 (!) 151/88         Passed - Cr in normal range and within 360 days    Creat  Date Value Ref Range Status  02/09/2015 1.00 0.50 - 1.05 mg/dL Final   Creatinine, Ser  Date Value Ref Range Status  08/26/2021 0.99 0.57 - 1.00 mg/dL Final   Creatinine, POC  Date Value Ref Range Status  12/04/2016 30 mg/dL Final         Passed - eGFR is 30 or above and within 360 days    GFR, Est African American  Date Value Ref Range Status  08/12/2014 >89 mL/min Final   GFR calc Af Amer  Date Value Ref Range Status  08/18/2019 64 >59 mL/min/1.73 Final    Comment:    **[facility] currently reports eGFR in compliance with the current**   recommendations of the [employer]. [facility] will   update reporting as new guidelines are published from the NKF-ASN   Task force.    GFR, Est Non African American  Date Value Ref Range Status  08/12/2014 82 mL/min Final    Comment:      The estimated GFR is a calculation valid for adults (>=66 years old) that uses the CKD-EPI algorithm to adjust for age and sex. It is   not to be used for children, pregnant women, hospitalized patients,    patients on dialysis, or  with rapidly changing kidney function. According to the NKDEP, eGFR >89 is normal, 60-89 shows mild impairment, 30-59 shows moderate impairment, 15-29 shows severe impairment and <15 is ESRD.      GFR, Estimated  Date Value Ref Range Status  08/19/2021 >60 >60 mL/min Final    Comment:    (NOTE) Calculated using the CKD-EPI Creatinine Equation (2021)    eGFR  Date Value Ref Range Status  08/26/2021 64 >59 mL/min/1.73 Final         Passed - Completed PHQ-2 or PHQ-9 in the last 360 days      Passed - Valid encounter within last 6 months    Recent Outpatient Visits          5 months ago Type 2 diabetes mellitus with other specified complication, without long-term current use of insulin ([place])   Farrell, Pleasant View, MD   10 months ago Type 2 diabetes mellitus with other specified complication, without long-term current use of insulin ([place])   Grant, Mount [place], MD   1 year ago Type 2 diabetes mellitus with other specified complication, without long-term current use of insulin ([place])   Bunn, Charlane Ferretti, MD   1 year ago Type 2 diabetes mellitus with other specified complication, without long-term current use of insulin ([place])   [facility]  [place], Kas[place], MD   1 year ago Type 2 diabetes mellitus with diabetic neuropathy, without long-term current use of insulin [facility])   [place], MD      Future Appointments            In 1 week [name], MD [place]   In 2 months [place], Ines Bloomer, MD [place] at [place]           . Dulaglutide (TRULICITY) 0.81 KG/8.1EH SOPN 2 mL 2    Sig: Inject 0.75 mg into the skin once a week.     Endocrinology:  Diabetes - GLP-1 Receptor Agonists Failed - 09/16/2021  4:51 PM      Failed - HBA1C is  between 0 and 7.9 and within 180 days    HbA1c, POC (controlled diabetic range)  Date Value Ref Range Status  03/29/2021 8.7 (A) 0.0 - 7.0 % Final         Passed - Valid encounter within last 6 months    Recent Outpatient Visits          5 months ago Type 2 diabetes mellitus with other specified complication, without long-term current use of insulin ([place])   [place], Bear Dance, MD   10 months ago Type 2 diabetes mellitus with other specified complication, without long-term current use of insulin ([place])   [place]field, Milltown, MD   1 year ago Type 2 diabetes mellitus with other specified complication, without long-term current use of insulin ([place])   Morton, Emmaus, MD   1 year ago Type 2 diabetes mellitus with other specified complication, without long-term current use of insulin ([place])   Olean, Gentryville, MD   1 year ago Type 2 diabetes mellitus with diabetic neuropathy, without long-term current use of insulin ([place])   [place], Enobong, MD      Future Appointments            In 1 week [name], MD [place]   In 2 months [place], Ines Bloomer, MD [place] at [place]           . Blood Glucose Monitoring Suppl (CONTOUR NEXT MONITOR) w/Device KIT 1 kit 0    Sig: 1 each by Does not apply route 3 (three) times daily as needed.     Endocrinology: Diabetes - Testing Supplies Passed - 09/16/2021  4:51 PM      Passed - Valid encounter within last 12 months    Recent Outpatient Visits          5 months ago Type 2 diabetes mellitus with other specified complication, without long-term current use of insulin ([place])   [place] Vista, Shattuck, MD   10 months ago Type 2 diabetes mellitus with other specified  complication, without long-term current use of insulin ([place])   [place], Arvin, MD   1 year ago Type 2 diabetes mellitus with other specified complication, without long-term current use of insulin ([place])   Force, Charlane Ferretti, MD   1 year ago Type 2 diabetes mellitus with other specified complication, without long-term current use of insulin ([place])   [place]  [facility] [name], MD   1 year ago Type 2 diabetes mellitus with diabetic neuropathy, without long-term current use of insulin ([place])   Crags[place], Enobong, MD      Future Appointments            In 1 week [name], MD [place]   In 2 months [place], Ines Bloomer, MD [place] at [place]           . atorvastatin (LIPITOR) 20 MG tablet 90 tablet 1    Sig: Take 1 tablet (20 mg total) by mouth daily.     Cardiovascular:  Antilipid - Statins Failed - 09/16/2021  4:51 PM      Failed - Lipid Panel in normal range within the last 12 months    Cholesterol, Total  Date Value Ref Range Status  08/26/2021 252 (H) 100 - 199 mg/dL Final   LDL Chol Calc (NIH)  Date Value Ref Range Status  08/26/2021 135 (H) 0 - 99 mg/dL Final   HDL  Date Value Ref Range Status  08/26/2021 49 >39 mg/dL Final   Triglycerides  Date Value Ref Range Status  08/26/2021 374 (H) 0 - 149 mg/dL Final         Passed - Patient is not pregnant      Passed - Valid encounter within last 12 months    Recent Outpatient Visits          5 months ago Type 2 diabetes mellitus with other specified complication, without long-term current use of insulin ([place])   Brandywine, Batesville, MD   10 months ago Type 2 diabetes mellitus with other specified complication, without long-term current use of insulin ([place])   Ilchester, Mershon, MD   1 year ago Type 2 diabetes mellitus with other specified complication, without long-term current use of insulin ([place])   Flem[place]burg, Roche Harbor, MD   1 year ago Type 2 diabetes mellitus with other specified complication, without long-term current use of insulin ([place])   [place], [place], MD   1 year ago Type 2 diabetes mellitus with diabetic neuropathy, without long-term current use of insulin ([place])   [place], Charlane Ferretti, MD      Future Appointments            In 1 week [name], MD [place]   In 2 months [place], Ines Bloomer, MD [place] at [facility]

## 2021-09-16 NOTE — Telephone Encounter (Signed)
Pt called to report that she left all of her medication in [place] and says she is not going back to get it. She is requesting for a refill of all of her medication except her muscle spasm meds.   Medication Refill - Medication: aspirin EC 81 MG tablet atorvastatin (LIPITOR) 20 MG tablet Blood Glucose Monitoring Suppl (CONTOUR NEXT MONITOR) w/Device KIT  Dulaglutide (TRULICITY) 2.42 PN/3.6RW SOPN DULoxetine (CYMBALTA) 60 MG capsule  empagliflozin (JARDIANCE) 25 MG TABS tablet gabapentin (NEURONTIN) 300 MG capsule glipiZIDE (GLUCOTROL) 10 MG tablet  glucose blood (CONTOUR NEXT TEST) test strip  hydrochlorothiazide (HYDRODIURIL) 25 MG tablet ibuprofen (ADVIL) 600 MG tablet metFORMIN (GLUCOPHAGE) 1000 MG tablet methocarbamol (ROBAXIN) 500 MG tablet   Has the patient contacted their pharmacy? Yes.   (Agent: If no, request that the patient contact the pharmacy for the refill. If patient does not wish to contact the pharmacy document the reason why and proceed with request.) (Agent: If yes, when and what did the pharmacy advise?)  Preferred Pharmacy (with phone number or street name):  [facility] ([place]), [place] - [address]  [address] [place] ([place]) [place] [address]  Phone: [phone] Fax: [phone]   Has the patient been seen for an appointment in the last year OR does the patient have an upcoming appointment? Yes.    Agent: Please be advised that RX refills may take up to 3 business days. We ask that you follow-up with your pharmacy.

## 2021-09-26 ENCOUNTER — Ambulatory Visit: Payer: BC Managed Care – PPO | Admitting: Family Medicine

## 2021-09-28 ENCOUNTER — Telehealth: Payer: Self-pay | Admitting: *Deleted

## 2021-09-28 NOTE — Patient Outreach (Signed)
  Care Coordination   09/28/2021 Name: Megan Lane MRN: [id number] DOB: 06/29/1955   Care Coordination Outreach Attempts:  An unsuccessful telephone outreach was attempted today to offer the patient information about available care coordination services as a benefit of their health plan.   Follow Up Plan:  Additional outreach attempts will be made to offer the patient care coordination information and services.   Encounter Outcome:  No Answer  Care Coordination Interventions Activated:  No   Care Coordination Interventions:  No, not indicated    Emelia Loron RN, BSN [place] [phone] [email or web address]

## 2021-12-07 ENCOUNTER — Encounter: Payer: Self-pay | Admitting: Emergency Medicine

## 2021-12-07 ENCOUNTER — Ambulatory Visit (INDEPENDENT_AMBULATORY_CARE_PROVIDER_SITE_OTHER): Payer: BC Managed Care – PPO | Admitting: Emergency Medicine

## 2021-12-07 VITALS — BP 108/62 | HR 90 | Temp 98.2°F | Ht 72.0 in | Wt 226.1 lb

## 2021-12-07 DIAGNOSIS — E1142 Type 2 diabetes mellitus with diabetic polyneuropathy: Secondary | ICD-10-CM

## 2021-12-07 DIAGNOSIS — Z7689 Persons encountering health services in other specified circumstances: Secondary | ICD-10-CM | POA: Diagnosis not present

## 2021-12-07 DIAGNOSIS — I152 Hypertension secondary to endocrine disorders: Secondary | ICD-10-CM | POA: Insufficient documentation

## 2021-12-07 DIAGNOSIS — E1159 Type 2 diabetes mellitus with other circulatory complications: Secondary | ICD-10-CM

## 2021-12-07 DIAGNOSIS — Z1211 Encounter for screening for malignant neoplasm of colon: Secondary | ICD-10-CM | POA: Diagnosis not present

## 2021-12-07 DIAGNOSIS — Z8673 Personal history of transient ischemic attack (TIA), and cerebral infarction without residual deficits: Secondary | ICD-10-CM | POA: Insufficient documentation

## 2021-12-07 DIAGNOSIS — E1169 Type 2 diabetes mellitus with other specified complication: Secondary | ICD-10-CM | POA: Diagnosis not present

## 2021-12-07 DIAGNOSIS — E785 Hyperlipidemia, unspecified: Secondary | ICD-10-CM

## 2021-12-07 LAB — POCT GLYCOSYLATED HEMOGLOBIN (HGB A1C): Hemoglobin A1C: 9.7 % — AB (ref 4.0–5.6)

## 2021-12-07 MED ORDER — TRULICITY 1.5 MG/0.5ML ~~LOC~~ SOAJ: 1.5000 mg | SUBCUTANEOUS | 3 refills | Status: DC

## 2021-12-07 NOTE — Progress Notes (Signed)
Megan Lane 66 y.o.   Chief Complaint  Patient presents with   New Patient (Initial Visit)    Several concerns     HISTORY OF PRESENT ILLNESS: This is a 66 y.o. female first visit to this office, here to establish care with me. Has a history of hypertension, diabetes, dyslipidemia. History of diabetic neuropathy on gabapentin and Cymbalta History of chronic sciatica History of stroke in the past  HPI   Prior to Admission medications   Medication Sig Start Date End Date Taking? Authorizing Provider  aspirin EC 81 MG tablet Take 1 tablet (81 mg total) by mouth daily. Swallow whole. 11/01/20  Yes [name], MD  atorvastatin (LIPITOR) 20 MG tablet Take 1 tablet (20 mg total) by mouth daily. 09/16/21  Yes [name], MD  Blood Glucose Monitoring Suppl (CONTOUR NEXT MONITOR) w/Device KIT 1 each by Does not apply route 3 (three) times daily as needed. 09/16/21  Yes Newlin, Charlane Ferretti, MD  Dulaglutide (TRULICITY) 9.89 QJ/1.9ER SOPN Inject 0.75 mg into the skin once a week. 09/16/21  Yes [name], MD  DULoxetine (CYMBALTA) 60 MG capsule Take 1 capsule (60 mg total) by mouth daily. 09/16/21  Yes [name], MD  empagliflozin (JARDIANCE) 25 MG TABS tablet Take 1 tablet (25 mg total) by mouth daily before breakfast. 09/16/21  Yes Newlin, Enobong, MD  gabapentin (NEURONTIN) 300 MG capsule Take 2 capsules (600 mg total) by mouth 3 (three) times daily. 09/16/21  Yes [name], MD  glipiZIDE (GLUCOTROL) 10 MG tablet Take 1 tablet (10 mg total) by mouth 2 (two) times daily before a meal. 09/16/21  Yes Newlin, Enobong, MD  glucose blood (CONTOUR NEXT TEST) test strip Use as instructed 09/16/21  Yes Newlin, Enobong, MD  hydrochlorothiazide (HYDRODIURIL) 25 MG tablet Take 1 tablet (25 mg total) by mouth daily. 09/16/21  Yes [name], MD  ibuprofen (ADVIL) 600 MG tablet Take 1 tablet (600 mg total) by mouth 3 (three) times daily. 09/16/21  Yes [name], MD  Lancets  (ACCU-CHEK SOFT TOUCH) lancets Use as instructed 02/12/17  Yes Hairston, Maylon Peppers, FNP  metFORMIN (GLUCOPHAGE) 1000 MG tablet Take 1 tablet (1,000 mg total) by mouth 2 (two) times daily with a meal. 09/16/21  Yes Newlin, Enobong, MD  methocarbamol (ROBAXIN) 500 MG tablet Take 2 tablets (1,000 mg total) by mouth 2 (two) times daily as needed for muscle spasms. 09/12/21  Yes [name], MD    Allergies  Allergen Reactions   Penicillins Rash    Did it involve swelling of the face/tongue/throat, SOB, or low BP? {[id number] Did it involve sudden or severe rash/hives, skin peeling, or any reaction on the inside of your mouth or nose? Y Did you need to seek medical attention at a hospital or doctor's office? Y When did it last happen?  Several years ago     If all above answers are "NO", may proceed with cephalosporin use.     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   HLD (hyperlipidemia) 07/08/2016   Type 2 diabetes mellitus ([place]) 10/16/2013   Essential hypertension, benign 04/30/2013    Past Medical History:  Diagnosis Date   Asthma    as a child   Diabetes mellitus    Hypercholesterolemia    Hypertension    Stroke ([place])    Ventral hernia    Wears glasses     Past Surgical History:  Procedure Laterality Date  ABDOMINAL HYSTERECTOMY     KNEE SURGERY     LEG SURGERY     left lower extremity   TUBAL LIGATION     VENTRAL HERNIA REPAIR N/A 05/29/2019   Procedure: REPAIR OF VENTRAL HERNIA WITH MESH;  Surgeon: Erroll Luna, MD;  Location: [facility];  Service: General;  Laterality: N/A;    Social History   Socioeconomic History   Marital status: Single    Spouse name: Not on file   Number of children: Not on file   Years of education: Not on file   Highest education level: Not on file  Occupational History   Occupation: Part time (04/13/20)  Tobacco Use   Smoking status: Former    Types: Cigarettes    Quit date:  08/11/2005    Years since quitting: 16.3   Smokeless tobacco: Never  Vaping Use   Vaping Use: Never used  Substance and Sexual Activity   Alcohol use: No   Drug use: No   Sexual activity: Not Currently  Other Topics Concern   Not on file  Social History Narrative   Lives with son   Right Handed   Drinks rarely caffeine   Social Determinants of Health   Financial Resource Strain: Not on file  Food Insecurity: Not on file  Transportation Needs: Not on file  Physical Activity: Not on file  Stress: Not on file  Social Connections: Not on file  Intimate Partner Violence: Not on file    Family History  Problem Relation Age of Onset   Diabetes Mother    Hypertension Mother    Stroke Mother    Heart disease Brother    Breast cancer Sister 31     Review of Systems  Constitutional: Negative.  Negative for chills and fever.  HENT: Negative.  Negative for congestion and sore throat.   Respiratory: Negative.  Negative for cough and shortness of breath.   Cardiovascular: Negative.  Negative for chest pain and palpitations.  Gastrointestinal: Negative.  Negative for abdominal pain, diarrhea, nausea and vomiting.  Musculoskeletal:  Positive for back pain.  Skin: Negative.  Negative for rash.  Neurological: Negative.  Negative for dizziness and headaches.  All other systems reviewed and are negative.  Today's Vitals   12/07/21 1008  BP: 108/62  Pulse: 90  Temp: 98.2 F (36.8 C)  TempSrc: Oral  SpO2: 94%  Weight: 226 lb 2 oz (102.6 kg)  Height: 6' (1.829 m)   Body mass index is 30.67 kg/m. Wt Readings from Last 3 Encounters:  12/07/21 226 lb 2 oz (102.6 kg)  03/29/21 233 lb (105.7 kg)  11/01/20 234 lb (106.1 kg)      Physical Exam Vitals reviewed.  Constitutional:      Appearance: Normal appearance.  HENT:     Head: Normocephalic.     Mouth/Throat:     Mouth: Mucous membranes are moist.     Pharynx: Oropharynx is clear.  Eyes:     Extraocular Movements:  Extraocular movements intact.     Conjunctiva/sclera: Conjunctivae normal.     Pupils: Pupils are equal, round, and reactive to light.  Cardiovascular:     Rate and Rhythm: Normal rate and regular rhythm.     Pulses: Normal pulses.     Heart sounds: Normal heart sounds.  Pulmonary:     Effort: Pulmonary effort is normal.     Breath sounds: Normal breath sounds.  Musculoskeletal:        General: Normal range of motion.  Cervical back: No tenderness.  Lymphadenopathy:     Cervical: No cervical adenopathy.  Skin:    General: Skin is warm and dry.  Neurological:     General: No focal deficit present.     Mental Status: She is alert and oriented to person, place, and time.  Psychiatric:        Mood and Affect: Mood normal.        Behavior: Behavior normal.    Results for orders placed or performed in visit on 12/07/21 (from the past 24 hour(s))  POCT HgB A1C     Status: Abnormal   Collection Time: 12/07/21 10:39 AM  Result Value Ref Range   Hemoglobin A1C 9.7 (A) 4.0 - 5.6 %   HbA1c POC (<> result, manual entry)     HbA1c, POC (prediabetic range)     HbA1c, POC (controlled diabetic range)       ASSESSMENT & PLAN: A total of 50 minutes was spent with the patient and counseling/coordination of care regarding preparing for this visit, review of available medical records, establishing care with me, review of multiple chronic medical conditions and their management, review of all medications and changes made, review of most recent blood work results including interpretation of today's hemoglobin A1c, cardiovascular risks associated with uncontrolled diabetes, education on nutrition, prognosis, documentation and need for follow-up in 3 months.  Problem List Items Addressed This Visit       Cardiovascular and Mediastinum   Hypertension associated with diabetes ([place]) - Primary    Well-controlled hypertension. Continue hydrochlorothiazide 25 mg daily. BP Readings from Last 3  Encounters:  12/07/21 108/62  08/19/21 (!) 151/88  08/19/21 (!) 185/101  Uncontrolled diabetes with hemoglobin A1c at 9.7 Lab Results  Component Value Date   HGBA1C 9.7 (A) 12/07/2021  Intolerant to metformin.  Diarrhea.  Does not want to take it. Recommend to increase Trulicity to 1.5 mg weekly Continue glipizide 10 mg twice a day and Jardiance 25 mg daily Cardiovascular risks associated with uncontrolled diabetes discussed. Diet and nutrition discussed. Follow-up in 3 months.        Relevant Medications   Dulaglutide (TRULICITY) 1.5 MP/5.3IR SOPN   Other Relevant Orders   POCT HgB A1C (Completed)     Endocrine   Dyslipidemia associated with type 2 diabetes mellitus (HCC)    Stable.  Diet and nutrition discussed. Continue atorvastatin 20 mg daily.       Relevant Medications   Dulaglutide (TRULICITY) 1.5 WE/3.1VQ SOPN   Diabetic polyneuropathy associated with type 2 diabetes mellitus (HCC)    Presently taking gabapentin 600 mg 3 times a day and Cymbalta 60 mg daily.  Continue those medications.      Relevant Medications   Dulaglutide (TRULICITY) 1.5 MG/8.6PY SOPN     Other   History of stroke    No significant residual deficits. Continue daily baby aspirin. Secondary prevention discussed with patient.      Other Visit Diagnoses     Encounter to establish care       Colon cancer screening       Relevant Orders   Ambulatory referral to Gastroenterology      Patient Instructions  Diabetes Mellitus and Nutrition, Adult When you have diabetes, or diabetes mellitus, it is very important to have healthy eating habits because your blood sugar (glucose) levels are greatly affected by what you eat and drink. Eating healthy foods in the right amounts, at about the same times every day, can help you: Manage  your blood glucose. Lower your risk of heart disease. Improve your blood pressure. Reach or maintain a healthy weight. What can affect my meal plan? Every  person with diabetes is different, and each person has different needs for a meal plan. Your health care provider may recommend that you work with a dietitian to make a meal plan that is best for you. Your meal plan may vary depending on factors such as: The calories you need. The medicines you take. Your weight. Your blood glucose, blood pressure, and cholesterol levels. Your activity level. Other health conditions you have, such as heart or kidney disease. How do carbohydrates affect me? Carbohydrates, also called carbs, affect your blood glucose level more than any other type of food. Eating carbs raises the amount of glucose in your blood. It is important to know how many carbs you can safely have in each meal. This is different for every person. Your dietitian can help you calculate how many carbs you should have at each meal and for each snack. How does alcohol affect me? Alcohol can cause a decrease in blood glucose (hypoglycemia), especially if you use insulin or take certain diabetes medicines by mouth. Hypoglycemia can be a life-threatening condition. Symptoms of hypoglycemia, such as sleepiness, dizziness, and confusion, are similar to symptoms of having too much alcohol. Do not drink alcohol if: Your health care provider tells you not to drink. You are pregnant, may be pregnant, or are planning to become pregnant. If you drink alcohol: Limit how much you have to: 0-1 drink a day for women. 0-2 drinks a day for men. Know how much alcohol is in your drink. In the [place] one drink equals one 12 oz bottle of beer (355 mL), one 5 oz glass of wine (148 mL), or one 1 oz glass of hard liquor (44 mL). Keep yourself hydrated with water, diet soda, or unsweetened iced tea. Keep in mind that regular soda, juice, and other mixers may contain a lot of sugar and must be counted as carbs. What are tips for following this plan?  Reading food labels Start by checking the serving size on the  Nutrition Facts label of packaged foods and drinks. The number of calories and the amount of carbs, fats, and other nutrients listed on the label are based on one serving of the item. Many items contain more than one serving per package. Check the total grams (g) of carbs in one serving. Check the number of grams of saturated fats and trans fats in one serving. Choose foods that have a low amount or none of these fats. Check the number of milligrams (mg) of salt (sodium) in one serving. Most people should limit total sodium intake to less than 2,300 mg per day. Always check the nutrition information of foods labeled as "low-fat" or "nonfat." These foods may be higher in added sugar or refined carbs and should be avoided. Talk to your dietitian to identify your daily goals for nutrients listed on the label. Shopping Avoid buying canned, pre-made, or processed foods. These foods tend to be high in fat, sodium, and added sugar. Shop around the outside edge of the grocery store. This is where you will most often find fresh fruits and vegetables, bulk grains, fresh meats, and fresh dairy products. Cooking Use low-heat cooking methods, such as baking, instead of high-heat cooking methods, such as deep frying. Cook using healthy oils, such as olive, canola, or sunflower oil. Avoid cooking with butter, cream, or high-fat meats. Meal  planning Eat meals and snacks regularly, preferably at the same times every day. Avoid going long periods of time without eating. Eat foods that are high in fiber, such as fresh fruits, vegetables, beans, and whole grains. Eat 4-6 oz (112-168 g) of lean protein each day, such as lean meat, chicken, fish, eggs, or tofu. One ounce (oz) (28 g) of lean protein is equal to: 1 oz (28 g) of meat, chicken, or fish. 1 egg.  cup (62 g) of tofu. Eat some foods each day that contain healthy fats, such as avocado, nuts, seeds, and fish. What foods should I eat? Fruits Berries. Apples.  Oranges. Peaches. Apricots. Plums. Grapes. Mangoes. Papayas. Pomegranates. Kiwi. Cherries. Vegetables Leafy greens, including lettuce, spinach, kale, chard, collard greens, mustard greens, and cabbage. Beets. Cauliflower. Broccoli. Carrots. Green beans. Tomatoes. Peppers. Onions. Cucumbers. Brussels sprouts. Grains Whole grains, such as whole-wheat or whole-grain bread, crackers, tortillas, cereal, and pasta. Unsweetened oatmeal. Quinoa. Brown or wild rice. Meats and other proteins Seafood. Poultry without skin. Lean cuts of poultry and beef. Tofu. Nuts. Seeds. Dairy Low-fat or fat-free dairy products such as milk, yogurt, and cheese. The items listed above may not be a complete list of foods and beverages you can eat and drink. Contact a dietitian for more information. What foods should I avoid? Fruits Fruits canned with syrup. Vegetables Canned vegetables. Frozen vegetables with butter or cream sauce. Grains Refined white flour and flour products such as bread, pasta, snack foods, and cereals. Avoid all processed foods. Meats and other proteins Fatty cuts of meat. Poultry with skin. Breaded or fried meats. Processed meat. Avoid saturated fats. Dairy Full-fat yogurt, cheese, or milk. Beverages Sweetened drinks, such as soda or iced tea. The items listed above may not be a complete list of foods and beverages you should avoid. Contact a dietitian for more information. Questions to ask a health care provider Do I need to meet with a certified diabetes care and education specialist? Do I need to meet with a dietitian? What number can I call if I have questions? When are the best times to check my blood glucose? Where to find more information: American Diabetes Association: diabetes.org Academy of Nutrition and Dietetics: eatright.[employer] of Diabetes and Digestive and Kidney Diseases: [employer]is Association of Diabetes Care & Education Specialists:  diabeteseducator.org Summary It is important to have healthy eating habits because your blood sugar (glucose) levels are greatly affected by what you eat and drink. It is important to use alcohol carefully. A healthy meal plan will help you manage your blood glucose and lower your risk of heart disease. Your health care provider may recommend that you work with a dietitian to make a meal plan that is best for you. This information is not intended to replace advice given to you by your health care provider. Make sure you discuss any questions you have with your health care provider. Document Revised: 08/27/2019 Document Reviewed: 08/27/2019 Elsevier Patient Education  [place], MD [facility]

## 2021-12-07 NOTE — Assessment & Plan Note (Signed)
Stable.  Diet and nutrition discussed. Continue atorvastatin 20 mg daily.

## 2021-12-07 NOTE — Assessment & Plan Note (Signed)
Well-controlled hypertension. Continue hydrochlorothiazide 25 mg daily. BP Readings from Last 3 Encounters:  12/07/21 108/62  08/19/21 (!) 151/88  08/19/21 (!) 185/101  Uncontrolled diabetes with hemoglobin A1c at 9.7 Lab Results  Component Value Date   HGBA1C 9.7 (A) 12/07/2021  Intolerant to metformin.  Diarrhea.  Does not want to take it. Recommend to increase Trulicity to 1.5 mg weekly Continue glipizide 10 mg twice a day and Jardiance 25 mg daily Cardiovascular risks associated with uncontrolled diabetes discussed. Diet and nutrition discussed. Follow-up in 3 months.

## 2021-12-07 NOTE — Patient Instructions (Signed)

## 2021-12-07 NOTE — Assessment & Plan Note (Signed)
No significant residual deficits. Continue daily baby aspirin. Secondary prevention discussed with patient.

## 2021-12-07 NOTE — Assessment & Plan Note (Signed)
Presently taking gabapentin 600 mg 3 times a day and Cymbalta 60 mg daily.  Continue those medications.

## 2021-12-09 ENCOUNTER — Encounter: Payer: Self-pay | Admitting: Gastroenterology

## 2021-12-19 ENCOUNTER — Ambulatory Visit (AMBULATORY_SURGERY_CENTER): Payer: Self-pay

## 2021-12-19 VITALS — Ht 72.0 in | Wt 222.0 lb

## 2021-12-19 DIAGNOSIS — Z1211 Encounter for screening for malignant neoplasm of colon: Secondary | ICD-10-CM

## 2021-12-19 MED ORDER — NA SULFATE-K SULFATE-MG SULF 17.5-3.13-1.6 GM/177ML PO SOLN: 1.0000 | Freq: Once | ORAL | 0 refills | Status: AC

## 2021-12-19 NOTE — Progress Notes (Signed)

## 2021-12-28 ENCOUNTER — Ambulatory Visit (AMBULATORY_SURGERY_CENTER): Payer: BC Managed Care – PPO | Admitting: Gastroenterology

## 2021-12-28 ENCOUNTER — Encounter: Payer: Self-pay | Admitting: Gastroenterology

## 2021-12-28 VITALS — BP 137/77 | HR 76 | Temp 97.7°F | Resp 12 | Ht 72.0 in | Wt 222.0 lb

## 2021-12-28 DIAGNOSIS — D124 Benign neoplasm of descending colon: Secondary | ICD-10-CM

## 2021-12-28 DIAGNOSIS — Z1211 Encounter for screening for malignant neoplasm of colon: Secondary | ICD-10-CM | POA: Diagnosis not present

## 2021-12-28 MED ORDER — SODIUM CHLORIDE 0.9 % IV SOLN: 500.0000 mL | Freq: Once | INTRAVENOUS | Status: DC

## 2021-12-28 NOTE — Progress Notes (Signed)
Sedate, gd SR, tolerated procedure well, VSS, report to RN 

## 2021-12-28 NOTE — Progress Notes (Signed)
[facility] History and Physical   Primary Care Physician:  Horald Pollen, MD   Reason for Procedure:   Colon cancer screening  Plan:    Screening colonoscopy     HPI: QUINTAVIA ROGSTAD is a 66 y.o. female undergoing average risk screening colonoscopy.  She has no family history of colon cancer and no chronic GI symptoms. She had a normal colonoscopy at [facility] in 2012 (per patient report).   Past Medical History:  Diagnosis Date   Asthma    as a child   Diabetes mellitus    Hypercholesterolemia    Hypertension    Stroke ([place]) 04/2021   TIA   Ventral hernia    Wears glasses     Past Surgical History:  Procedure Laterality Date   ABDOMINAL HYSTERECTOMY     COLONOSCOPY  2012   normal , at [place]     left lower extremity   TUBAL LIGATION     VENTRAL HERNIA REPAIR N/A 05/29/2019   Procedure: REPAIR OF VENTRAL HERNIA WITH MESH;  Surgeon: Erroll Luna, MD;  Location: [place];  Service: General;  Laterality: N/A;    Prior to Admission medications   Medication Sig Start Date End Date Taking? Authorizing Provider  atorvastatin (LIPITOR) 20 MG tablet Take 1 tablet (20 mg total) by mouth daily. 09/16/21  Yes [name], MD  DULoxetine (CYMBALTA) 60 MG capsule Take 1 capsule (60 mg total) by mouth daily. 09/16/21  Yes [name], MD  glipiZIDE (GLUCOTROL) 10 MG tablet Take 1 tablet (10 mg total) by mouth 2 (two) times daily before a meal. 09/16/21  Yes Newlin, Enobong, MD  glucose blood (CONTOUR NEXT TEST) test strip Use as instructed 09/16/21  Yes Newlin, Enobong, MD  hydrochlorothiazide (HYDRODIURIL) 25 MG tablet Take 1 tablet (25 mg total) by mouth daily. 09/16/21  Yes [name], MD  ibuprofen (ADVIL) 600 MG tablet Take 1 tablet (600 mg total) by mouth 3 (three) times daily. Patient taking differently: Take 600 mg by mouth every 6 (six) hours as needed. 09/16/21  Yes [name], MD  Lancets (ACCU-CHEK SOFT  TOUCH) lancets Use as instructed 02/12/17  Yes Hairston, Maylon Peppers, FNP  methocarbamol (ROBAXIN) 500 MG tablet Take 2 tablets (1,000 mg total) by mouth 2 (two) times daily as needed for muscle spasms. 09/12/21  Yes [name], MD  aspirin EC 81 MG tablet Take 1 tablet (81 mg total) by mouth daily. Swallow whole. 11/01/20   [name], MD  Blood Glucose Monitoring Suppl (CONTOUR NEXT MONITOR) w/Device KIT 1 each by Does not apply route 3 (three) times daily as needed. 09/16/21   [name], MD  Dulaglutide (TRULICITY) 1.5 PJ/0.9TO SOPN Inject 1.5 mg into the skin once a week. 12/07/21   Horald Pollen, MD  empagliflozin (JARDIANCE) 25 MG TABS tablet Take 1 tablet (25 mg total) by mouth daily before breakfast. 09/16/21   [name], MD  gabapentin (NEURONTIN) 300 MG capsule Take 2 capsules (600 mg total) by mouth 3 (three) times daily. 09/16/21   [name], MD    Current Outpatient Medications  Medication Sig Dispense Refill   atorvastatin (LIPITOR) 20 MG tablet Take 1 tablet (20 mg total) by mouth daily. 90 tablet 1   DULoxetine (CYMBALTA) 60 MG capsule Take 1 capsule (60 mg total) by mouth daily. 90 capsule 0   glipiZIDE (GLUCOTROL) 10 MG tablet Take 1 tablet (10 mg total) by  mouth 2 (two) times daily before a meal. 180 tablet 0   glucose blood (CONTOUR NEXT TEST) test strip Use as instructed 100 each 2   hydrochlorothiazide (HYDRODIURIL) 25 MG tablet Take 1 tablet (25 mg total) by mouth daily. 90 tablet 0   ibuprofen (ADVIL) 600 MG tablet Take 1 tablet (600 mg total) by mouth 3 (three) times daily. (Patient taking differently: Take 600 mg by mouth every 6 (six) hours as needed.) 60 tablet 2   Lancets (ACCU-CHEK SOFT TOUCH) lancets Use as instructed 100 each 12   methocarbamol (ROBAXIN) 500 MG tablet Take 2 tablets (1,000 mg total) by mouth 2 (two) times daily as needed for muscle spasms. 120 tablet 0   aspirin EC 81 MG tablet Take 1 tablet (81 mg total) by mouth daily.  Swallow whole. 30 tablet 11   Blood Glucose Monitoring Suppl (CONTOUR NEXT MONITOR) w/Device KIT 1 each by Does not apply route 3 (three) times daily as needed. 1 kit 0   Dulaglutide (TRULICITY) 1.5 BM/8.4XL SOPN Inject 1.5 mg into the skin once a week. 6 mL 3   empagliflozin (JARDIANCE) 25 MG TABS tablet Take 1 tablet (25 mg total) by mouth daily before breakfast. 90 tablet 0   gabapentin (NEURONTIN) 300 MG capsule Take 2 capsules (600 mg total) by mouth 3 (three) times daily. 540 capsule 0   Current Facility-Administered Medications  Medication Dose Route Frequency Provider Last Rate Last Admin   0.9 %  sodium chloride infusion  500 mL Intravenous Once Dustin Flock E, MD       0.9 %  sodium chloride infusion  500 mL Intravenous Once Daryel November, MD        Allergies as of 12/28/2021 - Review Complete 12/28/2021  Allergen Reaction Noted   Penicillins Rash 10/31/2011    Family History  Problem Relation Age of Onset   Diabetes Mother    Hypertension Mother    Stroke Mother    Breast cancer Sister 23   Heart disease Brother    Colon cancer Neg Hx    Colon polyps Neg Hx    Esophageal cancer Neg Hx    Rectal cancer Neg Hx    Stomach cancer Neg Hx     Social History   Socioeconomic History   Marital status: Single    Spouse name: Not on file   Number of children: Not on file   Years of education: Not on file   Highest education level: Not on file  Occupational History   Occupation: Part time (04/13/20)  Tobacco Use   Smoking status: Former    Packs/day: 3.00    Years: 35.00    Total pack years: 105.00    Types: Cigarettes    Quit date: 08/11/2005    Years since quitting: 16.3   Smokeless tobacco: Never   Tobacco comments:    Age 61 start  Vaping Use   Vaping Use: Never used  Substance and Sexual Activity   Alcohol use: Never   Drug use: Never   Sexual activity: Not Currently    Birth control/protection: Post-menopausal, Surgical  Other Topics Concern    Not on file  Social History Narrative   Lives with son   Right Handed   Drinks rarely caffeine   Social Determinants of Health   Financial Resource Strain: Not on file  Food Insecurity: Not on file  Transportation Needs: Not on file  Physical Activity: Not on file  Stress: Not on file  Social  Connections: Not on file  Intimate Partner Violence: Not on file    Review of Systems:  All other review of systems negative except as mentioned in the HPI.  Physical Exam: Vital signs BP 134/71   Pulse 76   Temp 97.7 F (36.5 C)   Ht 6' (1.829 m)   Wt 222 lb (100.7 kg)   SpO2 99%   BMI 30.11 kg/m   General:   Alert,  Well-developed, well-nourished, pleasant and cooperative in NAD Airway:  Mallampati 2 Lungs:  Clear throughout to auscultation.   Heart:  Regular rate and rhythm; no murmurs, clicks, rubs,  or gallops. Abdomen:  Soft, nontender and nondistended. Normal bowel sounds.   Neuro/Psych:  Normal mood and affect. A and O x 3   Demiana Crumbley E. Candis Schatz, MD [facility]

## 2021-12-28 NOTE — Patient Instructions (Addendum)
Resume previous diet. - Continue present medications. - Await pathology results. - Repeat colonoscopy (date not yet determined) for surveillance based on pathology results.  YOU HAD AN ENDOSCOPIC PROCEDURE TODAY: Refer to the procedure report and other information in the discharge instructions given to you for any specific questions about what was found during the examination. If this information does not answer your questions, please call [place] office at [phone] to clarify.   YOU SHOULD EXPECT: Some feelings of bloating in the abdomen. Passage of more gas than usual. Walking can help get rid of the air that was put into your GI tract during the procedure and reduce the bloating. If you had a lower endoscopy (such as a colonoscopy or flexible sigmoidoscopy) you may notice spotting of blood in your stool or on the toilet paper. Some abdominal soreness may be present for a day or two, also.  DIET: Your first meal following the procedure should be a light meal and then it is ok to progress to your normal diet. A half-sandwich or bowl of soup is an example of a good first meal. Heavy or fried foods are harder to digest and may make you feel nauseous or bloated. Drink plenty of fluids but you should avoid alcoholic beverages for 24 hours. If you had a esophageal dilation, please see attached instructions for diet.    ACTIVITY: Your care partner should take you home directly after the procedure. You should plan to take it easy, moving slowly for the rest of the day. You can resume normal activity the day after the procedure however YOU SHOULD NOT DRIVE, use power tools, machinery or perform tasks that involve climbing or major physical exertion for 24 hours (because of the sedation medicines used during the test).   SYMPTOMS TO REPORT IMMEDIATELY: A gastroenterologist can be reached at any hour. Please call [phone]  for any of the following symptoms:  Following lower endoscopy (colonoscopy,  flexible sigmoidoscopy) Excessive amounts of blood in the stool  Significant tenderness, worsening of abdominal pains  Swelling of the abdomen that is new, acute  Fever of 100 or higher   FOLLOW UP:  If any biopsies were taken you will be contacted by phone or by letter within the next 1-3 weeks. Call [phone]  if you have not heard about the biopsies in 3 weeks.  Please also call with any specific questions about appointments or follow up tests.

## 2021-12-28 NOTE — Op Note (Signed)
[place] Patient Name: Megan Lane Procedure Date: 12/28/2021 12:42 PM MRN: [id number] Endoscopist: Nicki Reaper E. Candis Schatz , MD, [id number] Age: 66 Referring MD:  Date of Birth: 04/12/1955 Gender: Female Account #: [id number] Procedure:                Colonoscopy Indications:              Screening for colorectal malignant neoplasm (last                            colonoscopy was more than 10 years ago) Medicines:                Monitored Anesthesia Care Procedure:                Pre-Anesthesia Assessment:                           - Prior to the procedure, a History and Physical                            was performed, and patient medications and                            allergies were reviewed. The patient's tolerance of                            previous anesthesia was also reviewed. The risks                            and benefits of the procedure and the sedation                            options and risks were discussed with the patient.                            All questions were answered, and informed consent                            was obtained. Prior Anticoagulants: The patient has                            taken no anticoagulant or antiplatelet agents. ASA                            Grade Assessment: II - A patient with mild systemic                            disease. After reviewing the risks and benefits,                            the patient was deemed in satisfactory condition to                            undergo the procedure.  After obtaining informed consent, the colonoscope                            was passed under direct vision. Throughout the                            procedure, the patient's blood pressure, pulse, and                            oxygen saturations were monitored continuously. The                            Olympus Scope [id number] was introduced through the                            anus and  advanced to the the cecum, identified by                            appendiceal orifice and ileocecal valve. The                            colonoscopy was performed without difficulty. The                            patient tolerated the procedure well. The quality                            of the bowel preparation was good. The ileocecal                            valve, appendiceal orifice, and rectum were                            photographed. The bowel preparation used was SUPREP                            via split dose instruction. Scope In: 12:47:59 PM Scope Out: 1:05:44 PM Scope Withdrawal Time: 0 hours 10 minutes 35 seconds  Total Procedure Duration: 0 hours 17 minutes 45 seconds  Findings:                 The perianal and digital rectal examinations were                            normal. Pertinent negatives include normal                            sphincter tone and no palpable rectal lesions.                           A 3 mm polyp was found in the descending colon. The                            polyp was sessile. The polyp was removed  with a                            jumbo cold forceps. Resection and retrieval were                            complete. Estimated blood loss was minimal.                           Two small-mouthed diverticula were found in the                            cecum.                           The exam was otherwise normal throughout the                            examined colon.                           Non-bleeding internal hemorrhoids were found during                            retroflexion. The hemorrhoids were Grade I                            (internal hemorrhoids that do not prolapse).                           No additional abnormalities were found on                            retroflexion. Complications:            No immediate complications. Estimated Blood Loss:     Estimated blood loss was minimal. Impression:               - One  3 mm polyp in the descending colon, removed                            with a jumbo cold forceps. Resected and retrieved.                           - Diverticulosis in the cecum.                           - Non-bleeding internal hemorrhoids. Recommendation:           - Patient has a contact number available for                            emergencies. The signs and symptoms of potential                            delayed complications were discussed with the  patient. Return to normal activities tomorrow.                            Written discharge instructions were provided to the                            patient.                           - Resume previous diet.                           - Continue present medications.                           - Await pathology results.                           - Repeat colonoscopy (date not yet determined) for                            surveillance based on pathology results. Sharnay Cashion E. Candis Schatz, MD 12/28/2021 1:09:41 PM This report has been signed electronically.

## 2021-12-28 NOTE — Progress Notes (Signed)
Pt's states no medical or surgical changes since previsit or office visit. 

## 2021-12-28 NOTE — Progress Notes (Signed)
Called to room to assist during endoscopic procedure.  Patient ID and intended procedure confirmed with present staff. Received instructions for my participation in the procedure from the performing physician.  

## 2022-01-02 ENCOUNTER — Telehealth: Payer: Self-pay

## 2022-01-02 NOTE — Telephone Encounter (Signed)
Post procedure follow up call, no answer 

## 2022-01-04 ENCOUNTER — Ambulatory Visit: Payer: BC Managed Care – PPO | Admitting: Family Medicine

## 2022-02-26 ENCOUNTER — Other Ambulatory Visit: Payer: Self-pay | Admitting: Family Medicine

## 2022-02-26 DIAGNOSIS — E1140 Type 2 diabetes mellitus with diabetic neuropathy, unspecified: Secondary | ICD-10-CM

## 2022-02-27 NOTE — Telephone Encounter (Signed)
Requested medication (s) are due for refill today: yes  Requested medication (s) are on the active medication list: yes  Last refill:  09/16/21  Future visit scheduled:no  Notes to clinic:  Unable to refill per protocol, another provider listed as PCP, routing for review     Requested Prescriptions  Pending Prescriptions Disp Refills   gabapentin (NEURONTIN) 300 MG capsule [Pharmacy Med Name: Gabapentin 300 MG Oral Capsule] 540 capsule 0    Sig: TAKE 2 [place]     Neurology: Anticonvulsants - gabapentin Passed - 02/26/2022  1:11 PM      Passed - Cr in normal range and within 360 days    Creat  Date Value Ref Range Status  02/09/2015 1.00 0.50 - 1.05 mg/dL Final   Creatinine, Ser  Date Value Ref Range Status  08/26/2021 0.99 0.57 - 1.00 mg/dL Final   Creatinine, POC  Date Value Ref Range Status  12/04/2016 30 mg/dL Final         Passed - Completed PHQ-2 or PHQ-9 in the last 360 days      Passed - Valid encounter within last 12 months    Recent Outpatient Visits           11 months ago Type 2 diabetes mellitus with other specified complication, without long-term current use of insulin ([place])   [place], Charlane Ferretti, MD   1 year ago Type 2 diabetes mellitus with other specified complication, without long-term current use of insulin ([place])   [place], [place], MD   1 year ago Type 2 diabetes mellitus with other specified complication, without long-term current use of insulin ([place])   [place], [place], MD   1 year ago Type 2 diabetes mellitus with other specified complication, without long-term current use of insulin ([place])   [place], Charlane Ferretti, MD   2 years ago Type 2 diabetes mellitus with diabetic neuropathy, without long-term current use of insulin [facility])   [place], Enobong, MD       Future Appointments             In 2 weeks [place], Ines [place], [place] at [facility]

## 2022-03-15 ENCOUNTER — Encounter: Payer: Self-pay | Admitting: Emergency Medicine

## 2022-03-15 ENCOUNTER — Ambulatory Visit (INDEPENDENT_AMBULATORY_CARE_PROVIDER_SITE_OTHER): Payer: BC Managed Care – PPO | Admitting: Emergency Medicine

## 2022-03-15 VITALS — BP 132/74 | HR 80 | Temp 98.1°F | Ht 72.0 in | Wt 221.5 lb

## 2022-03-15 DIAGNOSIS — E1140 Type 2 diabetes mellitus with diabetic neuropathy, unspecified: Secondary | ICD-10-CM | POA: Diagnosis not present

## 2022-03-15 DIAGNOSIS — E785 Hyperlipidemia, unspecified: Secondary | ICD-10-CM | POA: Diagnosis not present

## 2022-03-15 DIAGNOSIS — I152 Hypertension secondary to endocrine disorders: Secondary | ICD-10-CM | POA: Diagnosis not present

## 2022-03-15 DIAGNOSIS — E1169 Type 2 diabetes mellitus with other specified complication: Secondary | ICD-10-CM | POA: Diagnosis not present

## 2022-03-15 DIAGNOSIS — Z23 Encounter for immunization: Secondary | ICD-10-CM

## 2022-03-15 DIAGNOSIS — E1142 Type 2 diabetes mellitus with diabetic polyneuropathy: Secondary | ICD-10-CM

## 2022-03-15 DIAGNOSIS — E1159 Type 2 diabetes mellitus with other circulatory complications: Secondary | ICD-10-CM | POA: Diagnosis not present

## 2022-03-15 LAB — LIPID PANEL
Cholesterol: 208 mg/dL — ABNORMAL HIGH (ref 0–200)
HDL: 55.8 mg/dL (ref >39.00)
LDL Cholesterol: 119 mg/dL — ABNORMAL HIGH (ref 0–99)
NonHDL: 152.61
Total CHOL/HDL Ratio: 4
Triglycerides: 167 mg/dL — ABNORMAL HIGH (ref 0.0–149.0)
VLDL: 33.4 mg/dL (ref 0.0–40.0)

## 2022-03-15 LAB — COMPREHENSIVE METABOLIC PANEL WITH GFR
ALT: 15 U/L (ref 0–35)
AST: 14 U/L (ref 0–37)
Albumin: 4.1 g/dL (ref 3.5–5.2)
Alkaline Phosphatase: 121 U/L — ABNORMAL HIGH (ref 39–117)
BUN: 14 mg/dL (ref 6–23)
CO2: 25 meq/L (ref 19–32)
Calcium: 9.3 mg/dL (ref 8.4–10.5)
Chloride: 101 meq/L (ref 96–112)
Creatinine, Ser: 0.88 mg/dL (ref 0.40–1.20)
GFR: 69.06 mL/min
Glucose, Bld: 65 mg/dL — ABNORMAL LOW (ref 70–99)
Potassium: 3.4 meq/L — ABNORMAL LOW (ref 3.5–5.1)
Sodium: 141 meq/L (ref 135–145)
Total Bilirubin: 0.4 mg/dL (ref 0.2–1.2)
Total Protein: 7.6 g/dL (ref 6.0–8.3)

## 2022-03-15 LAB — POCT GLYCOSYLATED HEMOGLOBIN (HGB A1C): Hemoglobin A1C: 7.3 % — AB (ref 4.0–5.6)

## 2022-03-15 LAB — MICROALBUMIN / CREATININE URINE RATIO
Creatinine,U: 58.5 mg/dL
Microalb Creat Ratio: 1.2 mg/g (ref 0.0–30.0)
Microalb, Ur: <0.7 mg/dL (ref 0.0–1.9)

## 2022-03-15 MED ORDER — GABAPENTIN 300 MG PO CAPS: 600.0000 mg | ORAL_CAPSULE | Freq: Three times a day (TID) | ORAL | 3 refills | Status: DC

## 2022-03-15 MED ORDER — EMPAGLIFLOZIN 25 MG PO TABS: 25.0000 mg | ORAL_TABLET | Freq: Every day | ORAL | 3 refills | Status: DC

## 2022-03-15 NOTE — Patient Instructions (Signed)
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
## 2022-03-15 NOTE — Assessment & Plan Note (Signed)
Stable.  Lipid profile done today. Diet and nutrition discussed. Continue atorvastatin 20 mg daily. The 10-year ASCVD risk score (Arnett DK, et al., 2019) is: 27.1%   Values used to calculate the score:     Age: 67 years     Sex: Female     Is Non-Hispanic African American: Yes     Diabetic: Yes     Tobacco smoker: No     Systolic Blood Pressure: 235 mmHg     Is BP treated: Yes     HDL Cholesterol: 49 mg/dL     Total Cholesterol: 252 mg/dL

## 2022-03-15 NOTE — Assessment & Plan Note (Signed)
Well-controlled hypertension. Continue hydrochlorothiazide 25 mg daily Much improved diabetes with hemoglobin A1c of 7.3 Continue weekly Trulicity 1.5 mg, daily Jardiance 25 mg and glipizide 10 mg twice a day.  Intolerant to metformin Cardiovascular risk associated with hypertension and diabetes discussed. Diet and nutrition discussed. Follow-up in 3 months.

## 2022-03-15 NOTE — Assessment & Plan Note (Signed)
Stable.  Continue gabapentin 600 mg 3 times a day

## 2022-05-24 ENCOUNTER — Telehealth: Payer: Self-pay

## 2022-05-24 NOTE — Telephone Encounter (Signed)
Pt is wanting a rx refill for her   DULoxetine (CYMBALTA) 60 MG capsule    hydrochlorothiazide (HYDRODIURIL) 25 MG tablet  Sent to [facility] on file.   Pt has also stated [facility] has been on back order for 4 mnths now for her Trulicity and she has not been able to get the medication since. If possible can PCP send in an alternative.

## 2022-05-25 ENCOUNTER — Other Ambulatory Visit: Payer: Self-pay | Admitting: Emergency Medicine

## 2022-05-25 DIAGNOSIS — E1140 Type 2 diabetes mellitus with diabetic neuropathy, unspecified: Secondary | ICD-10-CM

## 2022-05-25 DIAGNOSIS — I10 Essential (primary) hypertension: Secondary | ICD-10-CM

## 2022-05-25 MED ORDER — HYDROCHLOROTHIAZIDE 25 MG PO TABS: 25.0000 mg | ORAL_TABLET | Freq: Every day | ORAL | 3 refills | Status: DC

## 2022-05-25 MED ORDER — DULOXETINE HCL 60 MG PO CPEP: 60.0000 mg | ORAL_CAPSULE | Freq: Every day | ORAL | 3 refills | Status: DC

## 2022-05-25 NOTE — Telephone Encounter (Signed)
New prescription sent to pharmacy of record.  Need pharmacy and or medical insurance to tell us what are the options to Trulicity.  Thanks.

## 2022-05-26 NOTE — Telephone Encounter (Signed)
Called patient and left message for patient to call office in reference to patient medication question

## 2022-06-13 ENCOUNTER — Encounter: Payer: Self-pay | Admitting: Emergency Medicine

## 2022-06-13 ENCOUNTER — Ambulatory Visit (INDEPENDENT_AMBULATORY_CARE_PROVIDER_SITE_OTHER): Payer: Medicare HMO | Admitting: Emergency Medicine

## 2022-06-13 VITALS — BP 120/84 | HR 83 | Temp 98.4°F | Ht 72.0 in | Wt 224.0 lb

## 2022-06-13 DIAGNOSIS — E785 Hyperlipidemia, unspecified: Secondary | ICD-10-CM | POA: Diagnosis not present

## 2022-06-13 DIAGNOSIS — E1169 Type 2 diabetes mellitus with other specified complication: Secondary | ICD-10-CM | POA: Diagnosis not present

## 2022-06-13 DIAGNOSIS — E1159 Type 2 diabetes mellitus with other circulatory complications: Secondary | ICD-10-CM | POA: Diagnosis not present

## 2022-06-13 DIAGNOSIS — I152 Hypertension secondary to endocrine disorders: Secondary | ICD-10-CM

## 2022-06-13 DIAGNOSIS — R079 Chest pain, unspecified: Secondary | ICD-10-CM | POA: Diagnosis not present

## 2022-06-13 LAB — POCT GLYCOSYLATED HEMOGLOBIN (HGB A1C): Hemoglobin A1C: 9.2 % — AB (ref 4.0–5.6)

## 2022-06-13 MED ORDER — TIRZEPATIDE 10 MG/0.5ML ~~LOC~~ SOAJ
10.0000 mg | SUBCUTANEOUS | 3 refills | Status: DC
Start: 2022-06-13 — End: 2022-07-25

## 2022-06-13 NOTE — Assessment & Plan Note (Signed)
Well-controlled hypertension Continue hydrochlorothiazide 25 mg Uncontrolled diabetes with hemoglobin A1c higher than before at 9.2 Continue glipizide 10 mg twice a day and Jardiance 25 mg Recommend to start weekly Mounjaro 10 mg. Cardiovascular risks associated with hypertension and uncontrolled diabetes discussed Diet and nutrition discussed Follow-up in 3 months

## 2022-06-13 NOTE — Patient Instructions (Signed)

## 2022-06-13 NOTE — Assessment & Plan Note (Signed)
Multiple CAD risk factors New onset of intermittent chest pains with normal EKG Needs cardiology evaluation. Referral placed today.

## 2022-06-13 NOTE — Assessment & Plan Note (Signed)
Uncontrolled diabetes as discussed above Continue atorvastatin 20 mg daily Lab Results  Component Value Date   CHOL 208 (H) 03/15/2022   HDL 55.80 03/15/2022   LDLCALC 119 (H) 03/15/2022   TRIG 167.0 (H) 03/15/2022   CHOLHDL 4 03/15/2022

## 2022-06-13 NOTE — Progress Notes (Signed)
Megan Lane 67 y.o.   Chief Complaint  Patient presents with   Medical Management of Chronic Issues    f/u appt, patient states she has some chest pressure off and on. She thinks its stress related     HISTORY OF PRESENT ILLNESS: This is a 67 y.o. female here for 44-month follow-up of diabetes, hypertension, dyslipidemia Has not been able to get Trulicity due to shortage.  Off it for 4 months Taking Jardiance.  Intolerant to metformin. Complaining of occasional episodes of sharp midsternal pain without radiation or associated symptoms, lasting 1 to 2 minutes, happens at work, could be stress related. No other complaints or medical concerns today. Lab Results  Component Value Date   HGBA1C 7.3 (A) 03/15/2022   Wt Readings from Last 3 Encounters:  06/13/22 224 lb (101.6 kg)  03/15/22 221 lb 8 oz (100.5 kg)  12/28/21 222 lb (100.7 kg)     HPI   Prior to Admission medications   Medication Sig Start Date End Date Taking? Authorizing Provider  aspirin EC 81 MG tablet Take 1 tablet (81 mg total) by mouth daily. Swallow whole. 11/01/20  Yes Hoy Register, MD  atorvastatin (LIPITOR) 20 MG tablet Take 1 tablet (20 mg total) by mouth daily. 09/16/21  Yes Hoy Register, MD  DULoxetine (CYMBALTA) 60 MG capsule Take 1 capsule (60 mg total) by mouth daily. 05/25/22  Yes Dearl Rudden, Eilleen Kempf, MD  empagliflozin (JARDIANCE) 25 MG TABS tablet Take 1 tablet (25 mg total) by mouth daily before breakfast. 03/15/22  Yes Maxamillian Tienda, Eilleen Kempf, MD  gabapentin (NEURONTIN) 300 MG capsule Take 2 capsules (600 mg total) by mouth 3 (three) times daily. 03/15/22  Yes Katie Moch, Eilleen Kempf, MD  glipiZIDE (GLUCOTROL) 10 MG tablet Take 1 tablet (10 mg total) by mouth 2 (two) times daily before a meal. 09/16/21  Yes Newlin, Enobong, MD  glucose blood (CONTOUR NEXT TEST) test strip Use as instructed 09/16/21  Yes Newlin, Odette Horns, MD  hydrochlorothiazide (HYDRODIURIL) 25 MG tablet Take 1 tablet (25 mg total)  by mouth daily. 05/25/22  Yes Naphtali Riede, Eilleen Kempf, MD  ibuprofen (ADVIL) 600 MG tablet Take 1 tablet (600 mg total) by mouth 3 (three) times daily. Patient taking differently: Take 600 mg by mouth every 6 (six) hours as needed. 09/16/21  Yes Hoy Register, MD  Lancets (ACCU-CHEK SOFT TOUCH) lancets Use as instructed 02/12/17  Yes Hairston, Oren Beckmann, FNP  methocarbamol (ROBAXIN) 500 MG tablet Take 2 tablets (1,000 mg total) by mouth 2 (two) times daily as needed for muscle spasms. 09/12/21  Yes Hoy Register, MD  Blood Glucose Monitoring Suppl (CONTOUR NEXT MONITOR) w/Device KIT 1 each by Does not apply route 3 (three) times daily as needed. 09/16/21   Hoy Register, MD  Dulaglutide (TRULICITY) 1.5 MG/0.5ML SOPN Inject 1.5 mg into the skin once a week. Patient not taking: Reported on 06/13/2022 12/07/21   Georgina Quint, MD    Allergies  Allergen Reactions   Penicillins Rash    Did it involve swelling of the face/tongue/throat, SOB, or low BP? {[id number] Did it involve sudden or severe rash/hives, skin peeling, or any reaction on the inside of your mouth or nose? Y Did you need to seek medical attention at a hospital or doctor's office? Y When did it last happen?  Several years ago     If all above answers are "NO", may proceed with cephalosporin use.     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   HLD (hyperlipidemia) 07/08/2016   Dyslipidemia associated with type 2 diabetes mellitus (HCC) 10/16/2013   Essential hypertension, benign 04/30/2013    Past Medical History:  Diagnosis Date   Asthma    as a child   Diabetes mellitus    Hypercholesterolemia    Hypertension    Stroke (HCC) 04/2021   TIA   Ventral hernia    Wears glasses     Past  Surgical History:  Procedure Laterality Date   ABDOMINAL HYSTERECTOMY     COLONOSCOPY  2012   normal , at [facility]   KNEE SURGERY     LEG SURGERY     left lower extremity   TUBAL LIGATION     VENTRAL HERNIA REPAIR N/A 05/29/2019   Procedure: REPAIR OF VENTRAL HERNIA WITH MESH;  Surgeon: Harriette Bouillon, MD;  Location: [facility] OR;  Service: General;  Laterality: N/A;    Social History   Socioeconomic History   Marital status: Single    Spouse name: Not on file   Number of children: Not on file   Years of education: Not on file   Highest education level: Not on file  Occupational History   Occupation: Part time (04/13/20)  Tobacco Use   Smoking status: Former    Packs/day: 3.00    Years: 35.00    Additional pack years: 0.00    Total pack years: 105.00    Types: Cigarettes    Quit date: 08/11/2005    Years since quitting: 16.8   Smokeless tobacco: Never   Tobacco comments:    Age 51 start  Vaping Use   Vaping Use: Never used  Substance and Sexual Activity   Alcohol use: Never   Drug use: Never   Sexual activity: Not Currently    Birth control/protection: Post-menopausal, Surgical  Other Topics Concern   Not on file  Social History Narrative   Lives with son   Right Handed   Drinks rarely caffeine   Social Determinants of Health   Financial Resource Strain: Not on file  Food Insecurity: Not on file  Transportation Needs: Not on file  Physical Activity: Not on file  Stress: Not on file  Social Connections: Not on file  Intimate Partner Violence: Not on file    Family History  Problem Relation Age of Onset   Diabetes Mother    Hypertension Mother    Stroke Mother    Breast cancer Sister 31   Heart disease Brother    Colon cancer Neg Hx    Colon polyps Neg Hx    Esophageal cancer Neg Hx    Rectal cancer Neg Hx    Stomach cancer Neg Hx      Review of Systems  Constitutional: Negative.  Negative for chills and fever.  HENT: Negative.  Negative for congestion  and sore throat.   Respiratory: Negative.  Negative for shortness of breath.   Cardiovascular:  Positive for chest pain. Negative for palpitations.  Gastrointestinal:  Negative for abdominal pain, diarrhea, nausea and vomiting.  Genitourinary: Negative.   Skin: Negative.  Negative for rash.  Neurological: Negative.  Negative for dizziness and headaches.  All other systems reviewed and are negative.   Vitals:   06/13/22 1303  BP: 120/84  Pulse: 83  Temp: 98.4 F (36.9 C)  SpO2: 94%  Physical Exam Vitals reviewed.  Constitutional:      Appearance: Normal appearance.  HENT:     Head: Normocephalic.     Mouth/Throat:     Mouth: Mucous membranes are moist.     Pharynx: Oropharynx is clear.  Eyes:     Extraocular Movements: Extraocular movements intact.     Conjunctiva/sclera: Conjunctivae normal.     Pupils: Pupils are equal, round, and reactive to light.  Cardiovascular:     Rate and Rhythm: Normal rate and regular rhythm.     Pulses: Normal pulses.     Heart sounds: Normal heart sounds.  Pulmonary:     Effort: Pulmonary effort is normal.     Breath sounds: Normal breath sounds.  Chest:     Chest wall: No tenderness.  Abdominal:     Palpations: Abdomen is soft.     Tenderness: There is no abdominal tenderness.  Musculoskeletal:     Cervical back: No tenderness.  Lymphadenopathy:     Cervical: No cervical adenopathy.  Skin:    General: Skin is warm and dry.     Capillary Refill: Capillary refill takes less than 2 seconds.  Neurological:     General: No focal deficit present.     Mental Status: She is alert and oriented to person, place, and time.  Psychiatric:        Mood and Affect: Mood normal.        Behavior: Behavior normal.    Results for orders placed or performed in visit on 06/13/22 (from the past 24 hour(s))  POCT HgB A1C     Status: Abnormal   Collection Time: 06/13/22  1:31 PM  Result Value Ref Range   Hemoglobin A1C 9.2 (A) 4.0 - 5.6 %   HbA1c  POC (<> result, manual entry)     HbA1c, POC (prediabetic range)     HbA1c, POC (controlled diabetic range)      EKG: Normal sinus rhythm with ventricular rate of 87/min.  No acute ischemic changes.  ASSESSMENT & PLAN: A total of 42 minutes was spent with the patient and counseling/coordination of care regarding preparing for this visit, review of most recent office visit notes, review of most recent blood work results including interpretation of today's hemoglobin A1c, review of multiple chronic medical conditions under management, review of all medications and changes made, education on nutrition, cardiovascular risks associated with uncontrolled diabetes, prognosis, documentation, need for follow-up.   Problem List Items Addressed This Visit       Cardiovascular and Mediastinum   Hypertension associated with diabetes (HCC) - Primary    Well-controlled hypertension Continue hydrochlorothiazide 25 mg Uncontrolled diabetes with hemoglobin A1c higher than before at 9.2 Continue glipizide 10 mg twice a day and Jardiance 25 mg Recommend to start weekly Mounjaro 10 mg. Cardiovascular risks associated with hypertension and uncontrolled diabetes discussed Diet and nutrition discussed Follow-up in 3 months      Relevant Medications   tirzepatide (MOUNJARO) 10 MG/0.5ML Pen   Other Relevant Orders   POCT HgB A1C (Completed)   Ambulatory referral to Cardiology     Endocrine   Dyslipidemia associated with type 2 diabetes mellitus (HCC)    Uncontrolled diabetes as discussed above Continue atorvastatin 20 mg daily Lab Results  Component Value Date   CHOL 208 (H) 03/15/2022   HDL 55.80 03/15/2022   LDLCALC 119 (H) 03/15/2022   TRIG 167.0 (H) 03/15/2022   CHOLHDL 4 03/15/2022        Relevant Medications  tirzepatide [facility]) 10 MG/0.5ML Pen     Other   Chest pain    Multiple CAD risk factors New onset of intermittent chest pains with normal EKG Needs cardiology  evaluation. Referral placed today.      Relevant Orders   EKG 12-Lead   Ambulatory referral to Cardiology    Patient Instructions  Diabetes Mellitus and Nutrition, Adult When you have diabetes, or diabetes mellitus, it is very important to have healthy eating habits because your blood sugar (glucose) levels are greatly affected by what you eat and drink. Eating healthy foods in the right amounts, at about the same times every day, can help you: Manage your blood glucose. Lower your risk of heart disease. Improve your blood pressure. Reach or maintain a healthy weight. What can affect my meal plan? Every person with diabetes is different, and each person has different needs for a meal plan. Your health care provider may recommend that you work with a dietitian to make a meal plan that is best for you. Your meal plan may vary depending on factors such as: The calories you need. The medicines you take. Your weight. Your blood glucose, blood pressure, and cholesterol levels. Your activity level. Other health conditions you have, such as heart or kidney disease. How do carbohydrates affect me? Carbohydrates, also called carbs, affect your blood glucose level more than any other type of food. Eating carbs raises the amount of glucose in your blood. It is important to know how many carbs you can safely have in each meal. This is different for every person. Your dietitian can help you calculate how many carbs you should have at each meal and for each snack. How does alcohol affect me? Alcohol can cause a decrease in blood glucose (hypoglycemia), especially if you use insulin or take certain diabetes medicines by mouth. Hypoglycemia can be a life-threatening condition. Symptoms of hypoglycemia, such as sleepiness, dizziness, and confusion, are similar to symptoms of having too much alcohol. Do not drink alcohol if: Your health care provider tells you not to drink. You are pregnant, may be  pregnant, or are planning to become pregnant. If you drink alcohol: Limit how much you have to: 0-1 drink a day for women. 0-2 drinks a day for men. Know how much alcohol is in your drink. In the [place] one drink equals one 12 oz bottle of beer (355 mL), one 5 oz glass of wine (148 mL), or one 1 oz glass of hard liquor (44 mL). Keep yourself hydrated with water, diet soda, or unsweetened iced tea. Keep in mind that regular soda, juice, and other mixers may contain a lot of sugar and must be counted as carbs. What are tips for following this plan?  Reading food labels Start by checking the serving size on the Nutrition Facts label of packaged foods and drinks. The number of calories and the amount of carbs, fats, and other nutrients listed on the label are based on one serving of the item. Many items contain more than one serving per package. Check the total grams (g) of carbs in one serving. Check the number of grams of saturated fats and trans fats in one serving. Choose foods that have a low amount or none of these fats. Check the number of milligrams (mg) of salt (sodium) in one serving. Most people should limit total sodium intake to less than 2,300 mg per day. Always check the nutrition information of foods labeled as "low-fat" or "nonfat." These foods may  be higher in added sugar or refined carbs and should be avoided. Talk to your dietitian to identify your daily goals for nutrients listed on the label. Shopping Avoid buying canned, pre-made, or processed foods. These foods tend to be high in fat, sodium, and added sugar. Shop around the outside edge of the grocery store. This is where you will most often find fresh fruits and vegetables, bulk grains, fresh meats, and fresh dairy products. Cooking Use low-heat cooking methods, such as baking, instead of high-heat cooking methods, such as deep frying. Cook using healthy oils, such as olive, canola, or sunflower oil. Avoid cooking with  butter, cream, or high-fat meats. Meal planning Eat meals and snacks regularly, preferably at the same times every day. Avoid going long periods of time without eating. Eat foods that are high in fiber, such as fresh fruits, vegetables, beans, and whole grains. Eat 4-6 oz (112-168 g) of lean protein each day, such as lean meat, chicken, fish, eggs, or tofu. One ounce (oz) (28 g) of lean protein is equal to: 1 oz (28 g) of meat, chicken, or fish. 1 egg.  cup (62 g) of tofu. Eat some foods each day that contain healthy fats, such as avocado, nuts, seeds, and fish. What foods should I eat? Fruits Berries. Apples. Oranges. Peaches. Apricots. Plums. Grapes. Mangoes. Papayas. Pomegranates. Kiwi. Cherries. Vegetables Leafy greens, including lettuce, spinach, kale, chard, collard greens, mustard greens, and cabbage. Beets. Cauliflower. Broccoli. Carrots. Green beans. Tomatoes. Peppers. Onions. Cucumbers. Brussels sprouts. Grains Whole grains, such as whole-wheat or whole-grain bread, crackers, tortillas, cereal, and pasta. Unsweetened oatmeal. Quinoa. Brown or wild rice. Meats and other proteins Seafood. Poultry without skin. Lean cuts of poultry and beef. Tofu. Nuts. Seeds. Dairy Low-fat or fat-free dairy products such as milk, yogurt, and cheese. The items listed above may not be a complete list of foods and beverages you can eat and drink. Contact a dietitian for more information. What foods should I avoid? Fruits Fruits canned with syrup. Vegetables Canned vegetables. Frozen vegetables with butter or cream sauce. Grains Refined white flour and flour products such as bread, pasta, snack foods, and cereals. Avoid all processed foods. Meats and other proteins Fatty cuts of meat. Poultry with skin. Breaded or fried meats. Processed meat. Avoid saturated fats. Dairy Full-fat yogurt, cheese, or milk. Beverages Sweetened drinks, such as soda or iced tea. The items listed above may not be a  complete list of foods and beverages you should avoid. Contact a dietitian for more information. Questions to ask a health care provider Do I need to meet with a certified diabetes care and education specialist? Do I need to meet with a dietitian? What number can I call if I have questions? When are the best times to check my blood glucose? Where to find more information: American Diabetes Association: diabetes.org Academy of Nutrition and Dietetics: eatright.[employer] of Diabetes and Digestive and Kidney Diseases: StageSync.si Association of Diabetes Care & Education Specialists: diabeteseducator.org Summary It is important to have healthy eating habits because your blood sugar (glucose) levels are greatly affected by what you eat and drink. It is important to use alcohol carefully. A healthy meal plan will help you manage your blood glucose and lower your risk of heart disease. Your health care provider may recommend that you work with a dietitian to make a meal plan that is best for you. This information is not intended to replace advice given to you by your health care provider. Make sure  you discuss any questions you have with your health care provider. Document Revised: 08/27/2019 Document Reviewed: 08/27/2019 Elsevier Patient Education  2023 Elsevier Inc.    [name], MD [facility]

## 2022-06-20 ENCOUNTER — Telehealth: Payer: Self-pay | Admitting: *Deleted

## 2022-06-20 NOTE — Telephone Encounter (Signed)
Prior auth started via cover my meds.  Awaiting determination.  Key: [id number]

## 2022-07-13 ENCOUNTER — Other Ambulatory Visit (HOSPITAL_COMMUNITY): Payer: Self-pay

## 2022-07-13 NOTE — Telephone Encounter (Signed)
Pharmacy Patient Advocate Encounter  Prior Authorization for [facility] 10MG /0.5ML pen-injectors has been APPROVED by [employer] from 06/26/2022 to 06/25/2023.   PA # [id number]  Copay is $0.00  Melanee Spry CPhT Rx Patient Advocate [phone]

## 2022-07-14 ENCOUNTER — Encounter: Payer: Self-pay | Admitting: *Deleted

## 2022-07-24 ENCOUNTER — Telehealth: Payer: Self-pay | Admitting: Emergency Medicine

## 2022-07-24 DIAGNOSIS — E1159 Type 2 diabetes mellitus with other circulatory complications: Secondary | ICD-10-CM

## 2022-07-24 NOTE — Telephone Encounter (Signed)
Patient called and asked about when her Greggory Keen would be sent to the pharmacy. She would like for it to be sent to [facility] on [address] in [place]. Best callback is [phone].

## 2022-07-25 MED ORDER — TIRZEPATIDE 10 MG/0.5ML ~~LOC~~ SOAJ
10.0000 mg | SUBCUTANEOUS | 3 refills | Status: DC
Start: 2022-07-25 — End: 2022-10-30

## 2022-07-25 NOTE — Telephone Encounter (Signed)
Sent medication to patient requested pharmacy  

## 2022-08-14 ENCOUNTER — Ambulatory Visit: Payer: BC Managed Care – PPO | Attending: Cardiology | Admitting: Cardiology

## 2022-08-14 ENCOUNTER — Encounter: Payer: Self-pay | Admitting: Cardiology

## 2022-08-14 VITALS — BP 134/74 | HR 82 | Ht 72.0 in | Wt 219.0 lb

## 2022-08-14 DIAGNOSIS — R072 Precordial pain: Secondary | ICD-10-CM

## 2022-08-14 DIAGNOSIS — E119 Type 2 diabetes mellitus without complications: Secondary | ICD-10-CM | POA: Diagnosis not present

## 2022-08-14 DIAGNOSIS — Z01812 Encounter for preprocedural laboratory examination: Secondary | ICD-10-CM | POA: Diagnosis not present

## 2022-08-14 DIAGNOSIS — I10 Essential (primary) hypertension: Secondary | ICD-10-CM

## 2022-08-14 MED ORDER — METOPROLOL TARTRATE 100 MG PO TABS: 100.0000 mg | ORAL_TABLET | ORAL | 0 refills | Status: DC

## 2022-08-14 NOTE — Progress Notes (Signed)
  Cardiology Office Note:  .   Date:  08/14/2022  ID:  Megan Lane, DOB 01/13/1956, MRN [id number] PCP: Georgina Quint, MD  [facility] Providers Cardiologist:  None    History of Present Illness: .   Megan Lane is a 67 y.o. female here for the evaluation of chest pressure at the request of Dr. Alvy Bimler.  Has had sharp midsternal chest pain lasting about 2 minutes duration at work could be stress related.  All brothers had PCI, CABG. Oldest died MI. Non smoker. TOB quit August 24, 2005.  Has diabetes taking Jardiance intolerance to metformin.  Hemoglobin A1c 7.3 at last check.  ROS: No syncope, no bleeding no orthopnea no significant shortness of breath.  Has had some right-sided deltoid region right shoulder region cramping/neuropathic type pain.  Studies Reviewed: .        06/13/2022-sinus rhythm 87 nonspecific ST-T wave changes  Echocardiogram 2021-normal EF  Risk Assessment/Calculations:            Physical Exam:   VS:  BP 134/74   Pulse 82   Ht 6' (1.829 m)   Wt 219 lb (99.3 kg)   SpO2 97%   BMI 29.70 kg/m    Wt Readings from Last 3 Encounters:  08/14/22 219 lb (99.3 kg)  06/13/22 224 lb (101.6 kg)  03/15/22 221 lb 8 oz (100.5 kg)    GEN: Well nourished, well developed in no acute distress NECK: No JVD; No carotid bruits CARDIAC: RRR, no murmurs, rubs, gallops RESPIRATORY:  Clear to auscultation without rales, wheezing or rhonchi  ABDOMEN: Soft, non-tender, non-distended EXTREMITIES:  No edema; No deformity   ASSESSMENT AND PLAN: .    Precordial chest pain - Diabetes, hypertension, strong family history of CAD all of her brothers with heart disease, stents, bypass, 1 died. - We will go ahead and proceed with coronary CT scan for further plaque analysis and to look for any significant stenosis.  Hopefully based upon her description this is more musculoskeletal type of discomfort however she is feeling the centralized chest discomfort during some  stressful periods at work. -Creatinine 0.88  Diabetes with hyperlipidemia - Currently on atorvastatin 20 mg a day with LDL of 119.  If plaque is present, LDL goal will be less than 70.  May consider switch to Crestor 20 mg.  Also on Jardiance, excellent. -Mounjaro for weight loss.  Last hemoglobin A1c 9.2.  Primary hypertension - On hydrochlorothiazide as well 25 mg.        Dispo: We will follow-up results of studies  Signed, Donato Schultz, MD

## 2022-08-14 NOTE — Patient Instructions (Signed)
Medication Instructions:  The current medical regimen is effective;  continue present plan and medications.  *If you need a refill on your cardiac medications before your next appointment, please call your pharmacy*   Lab Work: Please have blood work today (BMP)  If you have labs (blood work) drawn today and your tests are completely normal, you will receive your results only by: MyChart Message (if you have MyChart) OR A paper copy in the mail If you have any lab test that is abnormal or we need to change your treatment, we will call you to review the results.   Testing/Procedures:   Your cardiac CT will be scheduled at:   [facility] [address] [place], [place] [address] [phone]  Please arrive at the [facility] and Children's Entrance (Entrance C2) of [facility] 30 minutes prior to test start time. You can use the FREE valet parking offered at entrance C (encouraged to control the heart rate for the test)  Proceed to the [facility] Radiology Department (first floor) to check-in and test prep.  All radiology patients and guests should use entrance C2 at [facility], accessed from [facility], even though the hospital's physical address listed is [address]    Please follow these instructions carefully (unless otherwise directed):  An IV will be required for this test and Nitroglycerin will be given.  Hold all erectile dysfunction medications at least 3 days (72 hrs) prior to test. (Ie viagra, cialis, sildenafil, tadalafil, etc)   On the Night Before the Test: Be sure to Drink plenty of water. Do not consume any caffeinated/decaffeinated beverages or chocolate 12 hours prior to your test. Do not take any antihistamines 12 hours prior to your test.  On the Day of the Test: Drink plenty of water until 1 hour prior to the test. Do not eat any food 1 hour prior to test. You may take your regular medications prior  to the test.  Take metoprolol (Lopressor) two hours prior to test. If you take Furosemide/Hydrochlorothiazide/Spironolactone, please HOLD on the morning of the test. FEMALES- please wear underwire-free bra if available, avoid dresses & tight clothing  After the Test: Drink plenty of water. After receiving IV contrast, you may experience a mild flushed feeling. This is normal. On occasion, you may experience a mild rash up to 24 hours after the test. This is not dangerous. If this occurs, you can take Benadryl 25 mg and increase your fluid intake. If you experience trouble breathing, this can be serious. If it is severe call 911 IMMEDIATELY. If it is mild, please call our office. If you take any of these medications: Glipizide/Metformin, Avandament, Glucavance, please do not take 48 hours after completing test unless otherwise instructed.  We will call to schedule your test 2-4 weeks out understanding that some insurance companies will need an authorization prior to the service being performed.   For more information and frequently asked questions, please visit our website : [email or web address]  For non-scheduling related questions, please contact the cardiac imaging nurse navigator should you have any questions/concerns: Rockwell Alexandria, Cardiac Imaging Nurse Navigator Larey Brick, Cardiac Imaging Nurse Navigator  Heart and Vascular Services Direct Office Dial: [phone]   For scheduling needs, including cancellations and rescheduling, please call [place], [phone].   Follow-Up: At [facility], you and your health needs are our priority.  As part of our continuing mission to provide you with exceptional heart care, we have created  designated Provider Care Teams.  These Care Teams include your primary Cardiologist (physician) and Advanced Practice Providers (APPs -  Physician Assistants and Nurse Practitioners) who all work together to provide you  with the care you need, when you need it.  We recommend signing up for the patient portal called "MyChart".  Sign up information is provided on this After Visit Summary.  MyChart is used to connect with patients for Virtual Visits (Telemedicine).  Patients are able to view lab/test results, encounter notes, upcoming appointments, etc.  Non-urgent messages can be sent to your provider as well.   To learn more about what you can do with MyChart, go to [email or web address].    Your next appointment:   Follow up will be based on the results of the above testing.

## 2022-08-15 LAB — BASIC METABOLIC PANEL
BUN/Creatinine Ratio: 19 (ref 12–28)
BUN: 17 mg/dL (ref 8–27)
CO2: 22 mmol/L (ref 20–29)
Calcium: 9.8 mg/dL (ref 8.7–10.3)
Chloride: 101 mmol/L (ref 96–106)
Creatinine, Ser: 0.9 mg/dL (ref 0.57–1.00)
Glucose: 190 mg/dL — ABNORMAL HIGH (ref 70–99)
Potassium: 3.8 mmol/L (ref 3.5–5.2)
Sodium: 141 mmol/L (ref 134–144)
eGFR: 71 mL/min/{1.73_m2} (ref >59)

## 2022-08-21 ENCOUNTER — Ambulatory Visit: Payer: BC Managed Care – PPO | Admitting: Emergency Medicine

## 2022-08-21 ENCOUNTER — Encounter (HOSPITAL_COMMUNITY): Payer: Self-pay

## 2022-08-21 ENCOUNTER — Encounter: Payer: Self-pay | Admitting: Emergency Medicine

## 2022-08-21 VITALS — BP 126/82 | HR 108 | Temp 97.6°F | Ht 72.0 in | Wt 221.5 lb

## 2022-08-21 DIAGNOSIS — J22 Unspecified acute lower respiratory infection: Secondary | ICD-10-CM | POA: Diagnosis not present

## 2022-08-21 DIAGNOSIS — R051 Acute cough: Secondary | ICD-10-CM | POA: Insufficient documentation

## 2022-08-21 DIAGNOSIS — G8929 Other chronic pain: Secondary | ICD-10-CM | POA: Diagnosis not present

## 2022-08-21 DIAGNOSIS — M25511 Pain in right shoulder: Secondary | ICD-10-CM

## 2022-08-21 DIAGNOSIS — M62838 Other muscle spasm: Secondary | ICD-10-CM | POA: Diagnosis not present

## 2022-08-21 MED ORDER — METHOCARBAMOL 500 MG PO TABS
1000.0000 mg | ORAL_TABLET | Freq: Two times a day (BID) | ORAL | 0 refills | Status: DC | PRN
Start: 2022-08-21 — End: 2022-10-07

## 2022-08-21 MED ORDER — AZITHROMYCIN 250 MG PO TABS
ORAL_TABLET | ORAL | 0 refills | Status: DC
Start: 2022-08-21 — End: 2023-05-31

## 2022-08-21 MED ORDER — BENZONATATE 200 MG PO CAPS
200.0000 mg | ORAL_CAPSULE | Freq: Two times a day (BID) | ORAL | 0 refills | Status: DC | PRN
Start: 2022-08-21 — End: 2023-05-31

## 2022-08-21 NOTE — Assessment & Plan Note (Signed)
Pain management discussed Unremarkable physical exam Full range of motion of right shoulder

## 2022-08-21 NOTE — Assessment & Plan Note (Signed)
Viral upper respiratory infection now with secondary bacterial infection.  Will benefit from daily azithromycin for 5 days. No signs of pneumonia.  Clinically stable. Advised to rest and stay well-hydrated ED precautions given Advised to contact the office if no better or worse during the next several days

## 2022-08-21 NOTE — Assessment & Plan Note (Signed)
Cough management discussed Recommend over-the-counter Mucinex DM and cough drops Tessalon 200 mg as needed Advised to rest and stay well-hydrated

## 2022-08-21 NOTE — Patient Instructions (Signed)
Acute Bronchitis, Adult  Acute bronchitis is when air tubes in the lungs (bronchi) suddenly get swollen. The condition can make it hard for you to breathe. In adults, acute bronchitis usually goes away within 2 weeks. A cough caused by bronchitis may last up to 3 weeks. Smoking, allergies, and asthma can make the condition worse. What are the causes? Germs that cause cold and flu (viruses). The most common cause of this condition is the virus that causes the common cold. Bacteria. Substances that bother (irritate) the lungs, including: Smoke from cigarettes and other types of tobacco. Dust and pollen. Fumes from chemicals, gases, or burned fuel. Indoor or outdoor air pollution. What increases the risk? A weak body's defense system. This is also called the immune system. Any condition that affects your lungs and breathing, such as asthma. What are the signs or symptoms? A cough. Coughing up clear, yellow, or green mucus. Making high-pitched whistling sounds when you breathe, most often when you breathe out (wheezing). Runny or stuffy nose. Having too much mucus in your lungs (chest congestion). Shortness of breath. Body aches. A sore throat. How is this treated? Acute bronchitis may go away over time without treatment. Your doctor may tell you to: Drink more fluids. This will help thin your mucus so it is easier to cough up. Use a device that gets medicine into your lungs (inhaler). Use a vaporizer or a humidifier. These are machines that add water to the air. This helps with coughing and poor breathing. Take a medicine that thins mucus and helps clear it from your lungs. Take a medicine that prevents or stops coughing. It is not common to take an antibiotic medicine for this condition. Follow these instructions at home:  Take over-the-counter and prescription medicines only as told by your doctor. Use an inhaler, vaporizer, or humidifier as told by your doctor. Take two teaspoons  (10 mL) of honey at bedtime. This helps lessen your coughing at night. Drink enough fluid to keep your pee (urine) pale yellow. Do not smoke or use any products that contain nicotine or tobacco. If you need help quitting, ask your doctor. Get a lot of rest. Return to your normal activities when your doctor says that it is safe. Keep all follow-up visits. How is this prevented?  Wash your hands often with soap and water for at least 20 seconds. If you cannot use soap and water, use hand sanitizer. Avoid contact with people who have cold symptoms. Try not to touch your mouth, nose, or eyes with your hands. Avoid breathing in smoke or chemical fumes. Make sure to get the flu shot every year. Contact a doctor if: Your symptoms do not get better in 2 weeks. You have trouble coughing up the mucus. Your cough keeps you awake at night. You have a fever. Get help right away if: You cough up blood. You have chest pain. You have very bad shortness of breath. You faint or keep feeling like you are going to faint. You have a very bad headache. Your fever or chills get worse. These symptoms may be an emergency. Get help right away. Call your local emergency services (911 in the [place] Do not wait to see if the symptoms will go away. Do not drive yourself to the hospital. Summary Acute bronchitis is when air tubes in the lungs (bronchi) suddenly get swollen. In adults, acute bronchitis usually goes away within 2 weeks. Drink more fluids. This will help thin your mucus so it is easier   to cough up. Take over-the-counter and prescription medicines only as told by your doctor. Contact a doctor if your symptoms do not improve after 2 weeks of treatment. This information is not intended to replace advice given to you by your health care provider. Make sure you discuss any questions you have with your health care provider. Document Revised: 05/26/2020 Document Reviewed: 05/26/2020 Elsevier Patient  Education  2024 Elsevier Inc.  

## 2022-08-21 NOTE — Assessment & Plan Note (Signed)
Active and affecting quality of life Recommend use of muscle relaxants Robaxin 500 mg twice a day as needed

## 2022-08-21 NOTE — Progress Notes (Signed)
Megan Lane 67 y.o.   Chief Complaint  Patient presents with   Shoulder Lane    Right shoulder feels heavy, pressure on there. Nasal congestion and come cough, have been taking nyquil and dayquill which has help the cough but not too much. Tightness in the arm which then travel to the shoulder to neck area. ( It been going on for more than a month)    HISTORY OF PRESENT ILLNESS: This is a 67 y.o. female complaining of Lane to right shoulder area for weeks.  Lane radiates down the arm.  Thinks this is related to muscle spasms.  Ran out of her muscle spasm medication. Also complaining of flulike symptoms that started 4 days ago.  Mostly complaining of productive cough.  Nasal congestion. No other associated symptoms. No other complaints or medical concerns today.  Shoulder Lane  Pertinent negatives include no fever.     Prior to Admission medications   Medication Sig Start Date End Date Taking? Authorizing Provider  aspirin EC 81 MG tablet Take 1 tablet (81 mg total) by mouth daily. Swallow whole. 11/01/20  Yes Hoy Register, MD  atorvastatin (LIPITOR) 20 MG tablet Take 1 tablet (20 mg total) by mouth daily. 09/16/21  Yes Hoy Register, MD  Blood Glucose Monitoring Suppl (CONTOUR NEXT MONITOR) w/Device KIT 1 each by Does not apply route 3 (three) times daily as needed. 09/16/21  Yes Hoy Register, MD  DULoxetine (CYMBALTA) 60 MG capsule Take 1 capsule (60 mg total) by mouth daily. 05/25/22  Yes Lashaunda Schild, Eilleen Kempf, MD  empagliflozin (JARDIANCE) 25 MG TABS tablet Take 1 tablet (25 mg total) by mouth daily before breakfast. 03/15/22  Yes Jaquavis Felmlee, Eilleen Kempf, MD  gabapentin (NEURONTIN) 300 MG capsule Take 2 capsules (600 mg total) by mouth 3 (three) times daily. 03/15/22  Yes Jemuel Laursen, Eilleen Kempf, MD  glipiZIDE (GLUCOTROL) 10 MG tablet Take 1 tablet (10 mg total) by mouth 2 (two) times daily before a meal. 09/16/21  Yes Newlin, Enobong, MD  glucose blood (CONTOUR NEXT TEST) test  strip Use as instructed 09/16/21  Yes Newlin, Odette Horns, MD  hydrochlorothiazide (HYDRODIURIL) 25 MG tablet Take 1 tablet (25 mg total) by mouth daily. 05/25/22  Yes Awa Bachicha, Eilleen Kempf, MD  ibuprofen (ADVIL) 600 MG tablet Take 1 tablet (600 mg total) by mouth 3 (three) times daily. Patient taking differently: Take 600 mg by mouth every 6 (six) hours as needed. 09/16/21  Yes Hoy Register, MD  Lancets (ACCU-CHEK SOFT TOUCH) lancets Use as instructed 02/12/17  Yes Hairston, Oren Beckmann, FNP  methocarbamol (ROBAXIN) 500 MG tablet Take 2 tablets (1,000 mg total) by mouth 2 (two) times daily as needed for muscle spasms. 09/12/21  Yes Hoy Register, MD  metoprolol tartrate (LOPRESSOR) 100 MG tablet Take 1 tablet (100 mg total) by mouth as directed. Take (1) tablet two hours before your CT scan 08/14/22  Yes Jake Bathe, MD  tirzepatide [facility]) 10 MG/0.5ML Pen Inject 10 mg into the skin once a week. 07/25/22  Yes Michalla Ringer, Eilleen Kempf, MD    Allergies  Allergen Reactions   Penicillins Rash    Did it involve swelling of the face/tongue/throat, SOB, or low BP? {[id number] Did it involve sudden or severe rash/hives, skin peeling, or any reaction on the inside of your mouth or nose? Y Did you need to seek medical attention at a hospital or doctor's office? Y When did it last happen?  Several years ago     If all above answers  are "NO", may proceed with cephalosporin use.     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   Chest Lane 07/08/2016   HLD (hyperlipidemia) 07/08/2016   Dyslipidemia associated with type 2 diabetes mellitus (HCC) 10/16/2013   Essential hypertension, benign 04/30/2013    Past Medical History:  Diagnosis Date   Asthma    as a child   Diabetes  mellitus    Hypercholesterolemia    Hypertension    Stroke (HCC) 04/2021   TIA   Ventral hernia    Wears glasses     Past Surgical History:  Procedure Laterality Date   ABDOMINAL HYSTERECTOMY     COLONOSCOPY  2012   normal , at [facility]   KNEE SURGERY     LEG SURGERY     left lower extremity   TUBAL LIGATION     VENTRAL HERNIA REPAIR N/A 05/29/2019   Procedure: REPAIR OF VENTRAL HERNIA WITH MESH;  Surgeon: Harriette Bouillon, MD;  Location: [facility] OR;  Service: General;  Laterality: N/A;    Social History   Socioeconomic History   Marital status: Single    Spouse name: Not on file   Number of children: Not on file   Years of education: Not on file   Highest education level: Not on file  Occupational History   Occupation: Part time (04/13/20)  Tobacco Use   Smoking status: Former    Current packs/day: 0.00    Average packs/day: 3.0 packs/day for 35.0 years (105.0 ttl pk-yrs)    Types: Cigarettes    Start date: 08/12/1970    Quit date: 08/11/2005    Years since quitting: 17.0   Smokeless tobacco: Never   Tobacco comments:    Age 26 start  Vaping Use   Vaping status: Never Used  Substance and Sexual Activity   Alcohol use: Never   Drug use: Never   Sexual activity: Not Currently    Birth control/protection: Post-menopausal, Surgical  Other Topics Concern   Not on file  Social History Narrative   Lives with son   Right Handed   Drinks rarely caffeine   Social Determinants of Health   Financial Resource Strain: Not on file  Food Insecurity: Not on file  Transportation Needs: Not on file  Physical Activity: Not on file  Stress: Not on file  Social Connections: Not on file  Intimate Partner Violence: Not on file    Family History  Problem Relation Age of Onset   Diabetes Mother    Hypertension Mother    Stroke Mother    Breast cancer Sister 31   Heart disease Brother    Colon cancer Neg Hx    Colon polyps Neg Hx    Esophageal cancer Neg Hx    Rectal cancer Neg  Hx    Stomach cancer Neg Hx      Review of Systems  Constitutional: Negative.  Negative for chills and fever.  HENT:  Positive for congestion. Negative for sore throat.   Respiratory:  Positive for cough and sputum production. Negative for shortness of breath.   Cardiovascular: Negative.  Negative for chest Lane and palpitations.  Gastrointestinal:  Negative for nausea and vomiting.  Genitourinary: Negative.  Negative for dysuria and urgency.  Musculoskeletal:  Positive for joint Lane.  Skin: Negative.  Negative for rash.  Neurological:  Negative for dizziness and headaches.  All other systems reviewed and are negative.   Vitals:   08/21/22 1556  BP: 126/82  Pulse: (!) 108  Temp: 97.6 F (36.4 C)  SpO2: 98%    Physical Exam Vitals reviewed.  Constitutional:      Appearance: Normal appearance.  HENT:     Head: Normocephalic.     Nose: Congestion present.     Mouth/Throat:     Mouth: Mucous membranes are moist.     Pharynx: Oropharynx is clear.  Eyes:     Extraocular Movements: Extraocular movements intact.     Conjunctiva/sclera: Conjunctivae normal.     Pupils: Pupils are equal, round, and reactive to light.  Cardiovascular:     Rate and Rhythm: Normal rate and regular rhythm.     Pulses: Normal pulses.     Heart sounds: Normal heart sounds.  Pulmonary:     Effort: Pulmonary effort is normal.     Breath sounds: Normal breath sounds.  Musculoskeletal:     Cervical back: No tenderness.     Comments: Right shoulder: Full range of motion.  No tenderness or significant swelling.  No abnormal findings  Lymphadenopathy:     Cervical: No cervical adenopathy.  Skin:    General: Skin is warm and dry.     Capillary Refill: Capillary refill takes less than 2 seconds.  Neurological:     General: No focal deficit present.     Mental Status: She is alert and oriented to person, place, and time.  Psychiatric:        Mood and Affect: Mood normal.        Behavior: Behavior  normal.      ASSESSMENT & PLAN: A total of 43 minutes was spent with the patient and counseling/coordination of care regarding preparing for this visit, review of most recent office visit notes, review of multiple chronic medical conditions under management, review of all medications, Lane management of right shoulder Lane, diagnosis of lower respiratory infection and need for antibiotics, cough management, prognosis, ED precautions, documentation and need for follow-up.  Problem List Items Addressed This Visit       Respiratory   Lower respiratory infection - Primary    Viral upper respiratory infection now with secondary bacterial infection.  Will benefit from daily azithromycin for 5 days. No signs of pneumonia.  Clinically stable. Advised to rest and stay well-hydrated ED precautions given Advised to contact the office if no better or worse during the next several days      Relevant Medications   azithromycin (ZITHROMAX) 250 MG tablet     Musculoskeletal and Integument   Muscle spasm of right shoulder    Active and affecting quality of life Recommend use of muscle relaxants Robaxin 500 mg twice a day as needed      Relevant Medications   methocarbamol (ROBAXIN) 500 MG tablet     Other   Acute cough    Cough management discussed Recommend over-the-counter Mucinex DM and cough drops Tessalon 200 mg as needed Advised to rest and stay well-hydrated      Relevant Medications   benzonatate (TESSALON) 200 MG capsule   Chronic right shoulder Lane    Lane management discussed Unremarkable physical exam Full range of motion of right shoulder      Relevant Medications   methocarbamol (ROBAXIN) 500 MG tablet   Patient Instructions  Acute Bronchitis, Adult  Acute bronchitis is when air tubes  in the lungs (bronchi) suddenly get swollen. The condition can make it hard for you to breathe. In adults, acute bronchitis usually goes away within 2 weeks. A cough caused by  bronchitis may last up to 3 weeks. Smoking, allergies, and asthma can make the condition worse. What are the causes? Germs that cause cold and flu (viruses). The most common cause of this condition is the virus that causes the common cold. Bacteria. Substances that bother (irritate) the lungs, including: Smoke from cigarettes and other types of tobacco. Dust and pollen. Fumes from chemicals, gases, or burned fuel. Indoor or outdoor air pollution. What increases the risk? A weak body's defense system. This is also called the immune system. Any condition that affects your lungs and breathing, such as asthma. What are the signs or symptoms? A cough. Coughing up clear, yellow, or green mucus. Making high-pitched whistling sounds when you breathe, most often when you breathe out (wheezing). Runny or stuffy nose. Having too much mucus in your lungs (chest congestion). Shortness of breath. Body aches. A sore throat. How is this treated? Acute bronchitis may go away over time without treatment. Your doctor may tell you to: Drink more fluids. This will help thin your mucus so it is easier to cough up. Use a device that gets medicine into your lungs (inhaler). Use a vaporizer or a humidifier. These are machines that add water to the air. This helps with coughing and poor breathing. Take a medicine that thins mucus and helps clear it from your lungs. Take a medicine that prevents or stops coughing. It is not common to take an antibiotic medicine for this condition. Follow these instructions at home:  Take over-the-counter and prescription medicines only as told by your doctor. Use an inhaler, vaporizer, or humidifier as told by your doctor. Take two teaspoons (10 mL) of honey at bedtime. This helps lessen your coughing at night. Drink enough fluid to keep your pee (urine) pale yellow. Do not smoke or use any products that contain nicotine or tobacco. If you need help quitting, ask your  doctor. Get a lot of rest. Return to your normal activities when your doctor says that it is safe. Keep all follow-up visits. How is this prevented?  Wash your hands often with soap and water for at least 20 seconds. If you cannot use soap and water, use hand sanitizer. Avoid contact with people who have cold symptoms. Try not to touch your mouth, nose, or eyes with your hands. Avoid breathing in smoke or chemical fumes. Make sure to get the flu shot every year. Contact a doctor if: Your symptoms do not get better in 2 weeks. You have trouble coughing up the mucus. Your cough keeps you awake at night. You have a fever. Get help right away if: You cough up blood. You have chest Lane. You have very bad shortness of breath. You faint or keep feeling like you are going to faint. You have a very bad headache. Your fever or chills get worse. These symptoms may be an emergency. Get help right away. Call your local emergency services (911 in the [place] Do not wait to see if the symptoms will go away. Do not drive yourself to the hospital. Summary Acute bronchitis is when air tubes in the lungs (bronchi) suddenly get swollen. In adults, acute bronchitis usually goes away within 2 weeks. Drink more fluids. This will help thin your mucus so it is easier to cough up. Take over-the-counter and prescription medicines only  as told by your doctor. Contact a doctor if your symptoms do not improve after 2 weeks of treatment. This information is not intended to replace advice given to you by your health care provider. Make sure you discuss any questions you have with your health care provider. Document Revised: 05/26/2020 Document Reviewed: 05/26/2020 Elsevier Patient Education  2024 Elsevier Inc.     [name], MD [facility] [employer]

## 2022-08-23 ENCOUNTER — Other Ambulatory Visit: Payer: Self-pay | Admitting: Cardiovascular Disease

## 2022-08-23 ENCOUNTER — Ambulatory Visit (HOSPITAL_COMMUNITY)
Admission: RE | Admit: 2022-08-23 | Discharge: 2022-08-23 | Disposition: A | Discharge status: Home / Self Care | Payer: BC Managed Care – PPO | Source: Ambulatory Visit | Attending: Cardiovascular Disease | Admitting: Cardiovascular Disease

## 2022-08-23 ENCOUNTER — Ambulatory Visit (HOSPITAL_COMMUNITY)
Admission: RE | Admit: 2022-08-23 | Discharge: 2022-08-23 | Disposition: A | Discharge status: Home / Self Care | Payer: BC Managed Care – PPO | Source: Ambulatory Visit | Attending: Cardiology | Admitting: Cardiology

## 2022-08-23 DIAGNOSIS — I2510 Atherosclerotic heart disease of native coronary artery without angina pectoris: Secondary | ICD-10-CM | POA: Diagnosis not present

## 2022-08-23 DIAGNOSIS — R072 Precordial pain: Secondary | ICD-10-CM | POA: Diagnosis not present

## 2022-08-23 DIAGNOSIS — R931 Abnormal findings on diagnostic imaging of heart and coronary circulation: Secondary | ICD-10-CM

## 2022-08-23 MED ORDER — METOPROLOL TARTRATE 5 MG/5ML IV SOLN
10.0000 mg | INTRAVENOUS | Status: DC | PRN
  Administered 2022-08-23: 10 mg via INTRAVENOUS

## 2022-08-23 MED ORDER — DILTIAZEM HCL 25 MG/5ML IV SOLN
10.0000 mg | Freq: Once | INTRAVENOUS | Status: AC
  Administered 2022-08-23: 10 mg via INTRAVENOUS

## 2022-08-23 MED ORDER — DILTIAZEM HCL 25 MG/5ML IV SOLN
INTRAVENOUS | Status: AC
  Filled 2022-08-23: qty 5

## 2022-08-23 MED ORDER — DILTIAZEM HCL 25 MG/5ML IV SOLN
5.0000 mg | Freq: Once | INTRAVENOUS | Status: AC
  Administered 2022-08-23: 5 mg via INTRAVENOUS

## 2022-08-23 MED ORDER — METOPROLOL TARTRATE 5 MG/5ML IV SOLN
INTRAVENOUS | Status: AC
  Filled 2022-08-23: qty 10

## 2022-08-23 MED ORDER — IOHEXOL 350 MG/ML SOLN
95.0000 mL | Freq: Once | INTRAVENOUS | Status: AC | PRN
  Administered 2022-08-23: 95 mL via INTRAVENOUS

## 2022-08-23 MED ORDER — NITROGLYCERIN 0.4 MG SL SUBL
0.8000 mg | SUBLINGUAL_TABLET | Freq: Once | SUBLINGUAL | Status: AC
  Administered 2022-08-23: 0.8 mg via SUBLINGUAL

## 2022-08-23 MED ORDER — NITROGLYCERIN 0.4 MG SL SUBL
SUBLINGUAL_TABLET | SUBLINGUAL | Status: AC
  Filled 2022-08-23: qty 2

## 2022-09-01 ENCOUNTER — Telehealth: Payer: Self-pay | Admitting: Cardiology

## 2022-09-01 DIAGNOSIS — Z79899 Other long term (current) drug therapy: Secondary | ICD-10-CM

## 2022-09-01 DIAGNOSIS — E785 Hyperlipidemia, unspecified: Secondary | ICD-10-CM

## 2022-09-01 DIAGNOSIS — I2510 Atherosclerotic heart disease of native coronary artery without angina pectoris: Secondary | ICD-10-CM

## 2022-09-01 MED ORDER — ROSUVASTATIN CALCIUM 20 MG PO TABS: 20.0000 mg | ORAL_TABLET | Freq: Every day | ORAL | 3 refills | Status: DC

## 2022-09-01 NOTE — Telephone Encounter (Signed)
Patient is returning call in regards to results. Requesting return call.  

## 2022-09-01 NOTE — Telephone Encounter (Signed)
Jake Bathe, MD 08/28/2022  6:00 AM EDT     Nonobstructive coronary artery disease present.  Thankfully there is no significant disease requiring stenting/bypass, however lets work hard on plaque stabilization and prevention. Lets stop the atorvastatin 20 mg and switch over to Crestor/rosuvastatin 20 mg Repeat lipid panel in 2 months LDL goal will be less than 70.  Previous value was 119.    Pt aware of results and agreeable to changes in therapy.  RX sent into [facility] as requested.  Lipid panel ordered and scheduled for 11/02/22.

## 2022-09-08 ENCOUNTER — Other Ambulatory Visit: Payer: Self-pay | Admitting: Emergency Medicine

## 2022-09-08 DIAGNOSIS — J22 Unspecified acute lower respiratory infection: Secondary | ICD-10-CM

## 2022-09-08 DIAGNOSIS — R051 Acute cough: Secondary | ICD-10-CM

## 2022-09-13 ENCOUNTER — Ambulatory Visit: Payer: BC Managed Care – PPO | Admitting: Emergency Medicine

## 2022-10-07 ENCOUNTER — Other Ambulatory Visit: Payer: Self-pay | Admitting: Emergency Medicine

## 2022-10-07 DIAGNOSIS — M62838 Other muscle spasm: Secondary | ICD-10-CM

## 2022-10-07 DIAGNOSIS — G8929 Other chronic pain: Secondary | ICD-10-CM

## 2022-10-19 ENCOUNTER — Telehealth: Payer: Self-pay | Admitting: Emergency Medicine

## 2022-10-19 NOTE — Telephone Encounter (Signed)
Miss Clingenpeel dropped off paperwork to be completed by Dr. Alvy Bimler  Re: her Chronic condition. She is requesting that we fax it to [facility] Fax # located on the form after is completed.   I have placed the forms in the Doctor's Box @ the [employer].  Thanks!  [place]

## 2022-10-20 NOTE — Telephone Encounter (Signed)
Paperwork received. Awaiting provider signature. Form can't be faxed to [facility] due to patient signature not on form. Will call patient to inform her.

## 2022-10-24 ENCOUNTER — Telehealth: Payer: Self-pay

## 2022-10-24 NOTE — Telephone Encounter (Signed)
Please see 9/17 CMA note, called and left message

## 2022-10-24 NOTE — Telephone Encounter (Signed)
Called the patient to let her know that her Verification of Chronic Condition has been completed and available for pick up. Papers has been placed back on Don's desk until further notice.

## 2022-10-30 ENCOUNTER — Other Ambulatory Visit (HOSPITAL_COMMUNITY): Payer: Self-pay

## 2022-10-30 ENCOUNTER — Ambulatory Visit: Payer: BC Managed Care – PPO | Admitting: Emergency Medicine

## 2022-10-30 ENCOUNTER — Encounter: Payer: Self-pay | Admitting: Emergency Medicine

## 2022-10-30 VITALS — BP 128/78 | HR 99 | Temp 98.7°F | Ht 72.0 in | Wt 219.1 lb

## 2022-10-30 DIAGNOSIS — E1159 Type 2 diabetes mellitus with other circulatory complications: Secondary | ICD-10-CM

## 2022-10-30 DIAGNOSIS — Z7984 Long term (current) use of oral hypoglycemic drugs: Secondary | ICD-10-CM

## 2022-10-30 DIAGNOSIS — R221 Localized swelling, mass and lump, neck: Secondary | ICD-10-CM | POA: Insufficient documentation

## 2022-10-30 DIAGNOSIS — Z23 Encounter for immunization: Secondary | ICD-10-CM | POA: Diagnosis not present

## 2022-10-30 DIAGNOSIS — M25511 Pain in right shoulder: Secondary | ICD-10-CM | POA: Diagnosis not present

## 2022-10-30 DIAGNOSIS — E1169 Type 2 diabetes mellitus with other specified complication: Secondary | ICD-10-CM | POA: Diagnosis not present

## 2022-10-30 DIAGNOSIS — M62838 Other muscle spasm: Secondary | ICD-10-CM | POA: Diagnosis not present

## 2022-10-30 DIAGNOSIS — I152 Hypertension secondary to endocrine disorders: Secondary | ICD-10-CM

## 2022-10-30 DIAGNOSIS — E785 Hyperlipidemia, unspecified: Secondary | ICD-10-CM

## 2022-10-30 DIAGNOSIS — G8929 Other chronic pain: Secondary | ICD-10-CM

## 2022-10-30 DIAGNOSIS — Z8673 Personal history of transient ischemic attack (TIA), and cerebral infarction without residual deficits: Secondary | ICD-10-CM

## 2022-10-30 LAB — POCT GLYCOSYLATED HEMOGLOBIN (HGB A1C): Hemoglobin A1C: 10.5 % — AB (ref 4.0–5.6)

## 2022-10-30 MED ORDER — TIRZEPATIDE 10 MG/0.5ML ~~LOC~~ SOAJ
10.0000 mg | SUBCUTANEOUS | 3 refills | Status: DC
Start: 2022-10-30 — End: 2023-10-30
  Filled 2022-10-30: qty 2, 28d supply, fill #0
  Filled 2022-11-29: qty 2, 28d supply, fill #1
  Filled 2022-12-24: qty 2, 28d supply, fill #2
  Filled 2023-01-26: qty 2, 28d supply, fill #3
  Filled 2023-03-05: qty 2, 28d supply, fill #4
  Filled 2023-04-08: qty 2, 28d supply, fill #5
  Filled 2023-05-07: qty 2, 28d supply, fill #6
  Filled 2023-06-19 – 2023-06-29 (×2): qty 2, 28d supply, fill #7
  Filled 2023-07-23: qty 2, 28d supply, fill #8
  Filled 2023-08-21: qty 2, 28d supply, fill #9
  Filled 2023-08-23: qty 6, 84d supply, fill #9

## 2022-10-30 MED ORDER — EMPAGLIFLOZIN 25 MG PO TABS
25.0000 mg | ORAL_TABLET | Freq: Every day | ORAL | 3 refills | Status: DC
Start: 2022-10-30 — End: 2023-11-05
  Filled 2022-10-30: qty 90, 90d supply, fill #0
  Filled 2023-05-20: qty 90, 90d supply, fill #1
  Filled 2023-10-30: qty 90, 90d supply, fill #2

## 2022-10-30 MED ORDER — ROSUVASTATIN CALCIUM 20 MG PO TABS
20.0000 mg | ORAL_TABLET | Freq: Every day | ORAL | 3 refills | Status: DC
Start: 2022-10-30 — End: 2023-11-02
  Filled 2022-10-30 – 2023-09-24 (×2): qty 90, 90d supply, fill #0

## 2022-10-30 MED ORDER — CYCLOBENZAPRINE HCL 10 MG PO TABS
10.0000 mg | ORAL_TABLET | Freq: Three times a day (TID) | ORAL | 0 refills | Status: DC | PRN
  Filled 2022-10-30: qty 30, 10d supply, fill #0

## 2022-10-30 NOTE — Assessment & Plan Note (Signed)
Uncontrolled diabetes with hemoglobin A1c of 10.5 Continue glipizide and Jardiance Start weekly Mounjaro 5 mg Continue rosuvastatin 20 mg daily Diet and nutrition discussed Follow-up in 3 months

## 2022-10-30 NOTE — Assessment & Plan Note (Signed)
Differential diagnosis discussed Most likely adenopathy Recommend soft tissue ultrasound of neck

## 2022-10-30 NOTE — Assessment & Plan Note (Signed)
BP Readings from Last 3 Encounters:  10/30/22 128/78  08/23/22 113/67  08/21/22 126/82  Well-controlled hypertension Continue hydrochlorothiazide 25 mg daily Uncontrolled diabetes with hemoglobin A1c higher than before at 10.5. Was not able to get [facility] Continue Jardiance 25 mg daily, glipizide 10 mg twice a day and start Mounjaro 5 mg weekly Cardiovascular risks associated with uncontrolled diabetes discussed Diet and nutrition discussed Follow-up in 3 months

## 2022-10-30 NOTE — Progress Notes (Signed)
Megan Lane 67 y.o.   Chief Complaint  Patient presents with   Arm Pain    Patient is having right arm pain, shoulder pain   Sore Throat    Patient states when she swallows it hurts on her side of her throat.    Medical Management of Chronic Issues    Patient states she is unable to get the [facility], she is not taking     HISTORY OF PRESENT ILLNESS: This is a 67 y.o. female here for follow-up of chronic medical conditions including diabetes and hypertension Also complaining of left-sided throat pain when swallowing for the last 2 weeks and right upper back pain radiating to right shoulder and right upper arm for 2 weeks For diabetes only taking Jardiance.  Was unable to get [facility] due to pharmacy shortage. Lab Results  Component Value Date   HGBA1C 9.2 (A) 06/13/2022   Wt Readings from Last 3 Encounters:  10/30/22 219 lb 2 oz (99.4 kg)  08/21/22 221 lb 8 oz (100.5 kg)  08/14/22 219 lb (99.3 kg)   BP Readings from Last 3 Encounters:  10/30/22 128/78  08/23/22 113/67  08/21/22 126/82     Arm Pain  Pertinent negatives include no chest pain.  Sore Throat  Pertinent negatives include no abdominal pain, congestion, coughing, diarrhea, headaches, shortness of breath or vomiting.     Prior to Admission medications   Medication Sig Start Date End Date Taking? Authorizing Provider  aspirin EC 81 MG tablet Take 1 tablet (81 mg total) by mouth daily. Swallow whole. 11/01/20  Yes Hoy Register, MD  benzonatate (TESSALON) 200 MG capsule Take 1 capsule (200 mg total) by mouth 2 (two) times daily as needed for cough. 08/21/22  Yes Anali Cabanilla, Eilleen Kempf, MD  Blood Glucose Monitoring Suppl (CONTOUR NEXT MONITOR) w/Device KIT 1 each by Does not apply route 3 (three) times daily as needed. 09/16/21  Yes Hoy Register, MD  DULoxetine (CYMBALTA) 60 MG capsule Take 1 capsule (60 mg total) by mouth daily. 05/25/22  Yes Davona Kinoshita, Eilleen Kempf, MD  empagliflozin (JARDIANCE) 25 MG TABS  tablet Take 1 tablet (25 mg total) by mouth daily before breakfast. 03/15/22  Yes Lindsee Labarre, Eilleen Kempf, MD  glipiZIDE (GLUCOTROL) 10 MG tablet Take 1 tablet (10 mg total) by mouth 2 (two) times daily before a meal. 09/16/21  Yes Newlin, Enobong, MD  glucose blood (CONTOUR NEXT TEST) test strip Use as instructed 09/16/21  Yes Newlin, Odette Horns, MD  hydrochlorothiazide (HYDRODIURIL) 25 MG tablet Take 1 tablet (25 mg total) by mouth daily. 05/25/22  Yes Canesha Tesfaye, Eilleen Kempf, MD  ibuprofen (ADVIL) 600 MG tablet Take 1 tablet (600 mg total) by mouth 3 (three) times daily. Patient taking differently: Take 600 mg by mouth every 6 (six) hours as needed. 09/16/21  Yes Hoy Register, MD  Lancets (ACCU-CHEK SOFT TOUCH) lancets Use as instructed 02/12/17  Yes Hairston, Mandesia R, FNP  methocarbamol (ROBAXIN) 500 MG tablet TAKE 2 TABLETS BY MOUTH TWICE DAILY AS NEEDED FOR MUSCLE SPASM 10/07/22  Yes Samon Dishner, Eilleen Kempf, MD  metoprolol tartrate (LOPRESSOR) 100 MG tablet Take 1 tablet (100 mg total) by mouth as directed. Take (1) tablet two hours before your CT scan 08/14/22  Yes Jake Bathe, MD  tirzepatide [facility]) 10 MG/0.5ML Pen Inject 10 mg into the skin once a week. 07/25/22  Yes Georgina Quint, MD  azithromycin [facility]) 250 MG tablet Sig as indicated Patient not taking: Reported on 10/30/2022 08/21/22   [name]  Jose, MD    Allergies  Allergen Reactions   Penicillins Rash    Did it involve swelling of the face/tongue/throat, SOB, or low BP? {[id number] Did it involve sudden or severe rash/hives, skin peeling, or any reaction on the inside of your mouth or nose? Y Did you need to seek medical attention at a hospital or doctor's office? Y When did it last happen?  Several years ago     If all above answers are "NO", may proceed with cephalosporin use.     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   Chest pain 07/08/2016   HLD (hyperlipidemia) 07/08/2016   Dyslipidemia associated with type 2 diabetes mellitus (HCC) 10/16/2013   Essential hypertension, benign 04/30/2013    Past Medical History:  Diagnosis Date   Asthma    as a child   Diabetes mellitus    Hypercholesterolemia    Hypertension    Stroke (HCC) 04/2021   TIA   Ventral hernia    Wears glasses     Past Surgical History:  Procedure Laterality Date   ABDOMINAL HYSTERECTOMY     COLONOSCOPY  2012   normal , at [facility]   KNEE SURGERY     LEG SURGERY     left lower extremity   TUBAL LIGATION     VENTRAL HERNIA REPAIR N/A 05/29/2019   Procedure: REPAIR OF VENTRAL HERNIA WITH MESH;  Surgeon: Harriette Bouillon, MD;  Location: [facility] OR;  Service: General;  Laterality: N/A;    Social History   Socioeconomic History   Marital status: Single    Spouse name: Not on file   Number of children: Not on file   Years of education: Not on file   Highest education level: Not on file  Occupational History   Occupation: Part time (04/13/20)  Tobacco Use   Smoking status: Former    Current packs/day: 0.00    Average packs/day: 3.0 packs/day for 35.0 years (105.0 ttl pk-yrs)    Types: Cigarettes    Start date: 08/12/1970    Quit date: 08/11/2005    Years since quitting: 17.2   Smokeless tobacco: Never   Tobacco comments:    Age 74 start  Vaping Use   Vaping status: Never Used  Substance and Sexual Activity   Alcohol use: Never   Drug use: Never   Sexual activity: Not Currently    Birth control/protection: Post-menopausal, Surgical  Other Topics Concern   Not on file  Social History Narrative   Lives with son   Right Handed   Drinks  rarely caffeine   Social Determinants of Health   Financial Resource Strain: Not on file  Food Insecurity: Not on file  Transportation Needs: Not on file  Physical Activity: Not on file  Stress: Not on file  Social Connections: Not on file  Intimate Partner Violence: Not on file    Family History  Problem Relation Age of Onset   Diabetes Mother    Hypertension Mother    Stroke Mother    Breast cancer Sister 31   Heart disease Brother    Colon cancer Neg Hx  Colon polyps Neg Hx    Esophageal cancer Neg Hx    Rectal cancer Neg Hx    Stomach cancer Neg Hx      Review of Systems  Constitutional: Negative.  Negative for chills and fever.  HENT: Negative.  Negative for congestion and sore throat.   Respiratory: Negative.  Negative for cough and shortness of breath.   Cardiovascular: Negative.  Negative for chest pain and palpitations.  Gastrointestinal:  Negative for abdominal pain, diarrhea, nausea and vomiting.  Genitourinary: Negative.   Skin: Negative.  Negative for rash.  Neurological: Negative.  Negative for dizziness and headaches.  All other systems reviewed and are negative.   Vitals:   10/30/22 1314  BP: 128/78  Pulse: 99  Temp: 98.7 F (37.1 C)  SpO2: 94%    Physical Exam Vitals reviewed.  Constitutional:      Appearance: She is well-developed.  HENT:     Head: Normocephalic.     Mouth/Throat:     Mouth: Mucous membranes are moist.     Pharynx: Oropharynx is clear.  Eyes:     Extraocular Movements: Extraocular movements intact.     Conjunctiva/sclera: Conjunctivae normal.     Pupils: Pupils are equal, round, and reactive to light.  Cardiovascular:     Rate and Rhythm: Normal rate and regular rhythm.     Pulses: Normal pulses.     Heart sounds: Normal heart sounds.  Pulmonary:     Effort: Pulmonary effort is normal.     Breath sounds: Normal breath sounds.  Musculoskeletal:     Comments: Tenderness with muscle spasm to right trapezius area   Lymphadenopathy:     Cervical: Cervical adenopathy present.  Skin:    General: Skin is warm and dry.     Capillary Refill: Capillary refill takes less than 2 seconds.  Neurological:     General: No focal deficit present.     Mental Status: She is alert and oriented to person, place, and time.  Psychiatric:        Mood and Affect: Mood normal.        Behavior: Behavior normal.    Results for orders placed or performed in visit on 10/30/22 (from the past 24 hour(s))  POCT HgB A1C     Status: Abnormal   Collection Time: 10/30/22  1:59 PM  Result Value Ref Range   Hemoglobin A1C 10.5 (A) 4.0 - 5.6 %   HbA1c POC (<> result, manual entry)     HbA1c, POC (prediabetic range)     HbA1c, POC (controlled diabetic range)        ASSESSMENT & PLAN: A total of 47 minutes was spent with the patient and counseling/coordination of care regarding preparing for this visit, review of most recent office visit notes, review of multiple chronic medical conditions under management, cardiovascular risks associated with uncontrolled diabetes, review of most recent blood work results including interpretation of today's hemoglobin A1c, review of all medications and changes made, education on nutrition, review of health maintenance items, prognosis, documentation, and need for follow-up  Problem List Items Addressed This Visit       Cardiovascular and Mediastinum   Hypertension associated with diabetes (HCC) - Primary    BP Readings from Last 3 Encounters:  10/30/22 128/78  08/23/22 113/67  08/21/22 126/82  Well-controlled hypertension Continue hydrochlorothiazide 25 mg daily Uncontrolled diabetes with hemoglobin A1c higher than before at 10.5. Was not able to get [facility] Continue Jardiance 25 mg daily, glipizide 10  mg twice a day and start Mounjaro 5 mg weekly Cardiovascular risks associated with uncontrolled diabetes discussed Diet and nutrition discussed Follow-up in 3 months       Relevant  Medications   tirzepatide (MOUNJARO) 10 MG/0.5ML Pen   empagliflozin (JARDIANCE) 25 MG TABS tablet   rosuvastatin (CRESTOR) 20 MG tablet   Other Relevant Orders   POCT HgB A1C (Completed)     Endocrine   Dyslipidemia associated with type 2 diabetes mellitus (HCC)    Uncontrolled diabetes with hemoglobin A1c of 10.5 Continue glipizide and Jardiance Start weekly Mounjaro 5 mg Continue rosuvastatin 20 mg daily Diet and nutrition discussed Follow-up in 3 months       Relevant Medications   tirzepatide (MOUNJARO) 10 MG/0.5ML Pen   empagliflozin (JARDIANCE) 25 MG TABS tablet   rosuvastatin (CRESTOR) 20 MG tablet     Musculoskeletal and Integument   Muscle spasm of right shoulder   Relevant Medications   cyclobenzaprine (FLEXERIL) 10 MG tablet     Other   History of stroke    Secondary stroke prevention measures discussed Importance of diabetes and hypertension control addressed Need to take statin addressed Continues daily baby aspirin      Chronic right shoulder pain    Active and affecting quality of life Tender muscle spasm Recommend Flexeril 10 mg every 6-8 hours as needed.      Relevant Medications   cyclobenzaprine (FLEXERIL) 10 MG tablet   Lump in neck    Differential diagnosis discussed Most likely adenopathy Recommend soft tissue ultrasound of neck      Relevant Orders   US Soft Tissue Head/Neck (NON-THYROID)   Other Visit Diagnoses     Type 2 diabetes mellitus with other specified complication, without long-term current use of insulin (HCC)       Relevant Medications   tirzepatide (MOUNJARO) 10 MG/0.5ML Pen   empagliflozin (JARDIANCE) 25 MG TABS tablet   rosuvastatin (CRESTOR) 20 MG tablet   Need for vaccination       Relevant Orders   Flu Vaccine Trivalent High Dose (Fluad) (Completed)       Patient Instructions  Diabetes Mellitus and Nutrition, Adult When you have diabetes, or diabetes mellitus, it is very important to have healthy eating  habits because your blood sugar (glucose) levels are greatly affected by what you eat and drink. Eating healthy foods in the right amounts, at about the same times every day, can help you: Manage your blood glucose. Lower your risk of heart disease. Improve your blood pressure. Reach or maintain a healthy weight. What can affect my meal plan? Every person with diabetes is different, and each person has different needs for a meal plan. Your health care provider may recommend that you work with a dietitian to make a meal plan that is best for you. Your meal plan may vary depending on factors such as: The calories you need. The medicines you take. Your weight. Your blood glucose, blood pressure, and cholesterol levels. Your activity level. Other health conditions you have, such as heart or kidney disease. How do carbohydrates affect me? Carbohydrates, also called carbs, affect your blood glucose level more than any other type of food. Eating carbs raises the amount of glucose in your blood. It is important to know how many carbs you can safely have in each meal. This is different for every person. Your dietitian can help you calculate how many carbs you should have at each meal and for each snack. How does alcohol  affect me? Alcohol can cause a decrease in blood glucose (hypoglycemia), especially if you use insulin or take certain diabetes medicines by mouth. Hypoglycemia can be a life-threatening condition. Symptoms of hypoglycemia, such as sleepiness, dizziness, and confusion, are similar to symptoms of having too much alcohol. Do not drink alcohol if: Your health care provider tells you not to drink. You are pregnant, may be pregnant, or are planning to become pregnant. If you drink alcohol: Limit how much you have to: 0-1 drink a day for women. 0-2 drinks a day for men. Know how much alcohol is in your drink. In the [place] one drink equals one 12 oz bottle of beer (355 mL), one 5 oz glass of  wine (148 mL), or one 1 oz glass of hard liquor (44 mL). Keep yourself hydrated with water, diet soda, or unsweetened iced tea. Keep in mind that regular soda, juice, and other mixers may contain a lot of sugar and must be counted as carbs. What are tips for following this plan?  Reading food labels Start by checking the serving size on the Nutrition Facts label of packaged foods and drinks. The number of calories and the amount of carbs, fats, and other nutrients listed on the label are based on one serving of the item. Many items contain more than one serving per package. Check the total grams (g) of carbs in one serving. Check the number of grams of saturated fats and trans fats in one serving. Choose foods that have a low amount or none of these fats. Check the number of milligrams (mg) of salt (sodium) in one serving. Most people should limit total sodium intake to less than 2,300 mg per day. Always check the nutrition information of foods labeled as "low-fat" or "nonfat." These foods may be higher in added sugar or refined carbs and should be avoided. Talk to your dietitian to identify your daily goals for nutrients listed on the label. Shopping Avoid buying canned, pre-made, or processed foods. These foods tend to be high in fat, sodium, and added sugar. Shop around the outside edge of the grocery store. This is where you will most often find fresh fruits and vegetables, bulk grains, fresh meats, and fresh dairy products. Cooking Use low-heat cooking methods, such as baking, instead of high-heat cooking methods, such as deep frying. Cook using healthy oils, such as olive, canola, or sunflower oil. Avoid cooking with butter, cream, or high-fat meats. Meal planning Eat meals and snacks regularly, preferably at the same times every day. Avoid going long periods of time without eating. Eat foods that are high in fiber, such as fresh fruits, vegetables, beans, and whole grains. Eat 4-6 oz  (112-168 g) of lean protein each day, such as lean meat, chicken, fish, eggs, or tofu. One ounce (oz) (28 g) of lean protein is equal to: 1 oz (28 g) of meat, chicken, or fish. 1 egg.  cup (62 g) of tofu. Eat some foods each day that contain healthy fats, such as avocado, nuts, seeds, and fish. What foods should I eat? Fruits Berries. Apples. Oranges. Peaches. Apricots. Plums. Grapes. Mangoes. Papayas. Pomegranates. Kiwi. Cherries. Vegetables Leafy greens, including lettuce, spinach, kale, chard, collard greens, mustard greens, and cabbage. Beets. Cauliflower. Broccoli. Carrots. Green beans. Tomatoes. Peppers. Onions. Cucumbers. Brussels sprouts. Grains Whole grains, such as whole-wheat or whole-grain bread, crackers, tortillas, cereal, and pasta. Unsweetened oatmeal. Quinoa. Brown or wild rice. Meats and other proteins Seafood. Poultry without skin. Lean cuts of poultry and beef.  Tofu. Nuts. Seeds. Dairy Low-fat or fat-free dairy products such as milk, yogurt, and cheese. The items listed above may not be a complete list of foods and beverages you can eat and drink. Contact a dietitian for more information. What foods should I avoid? Fruits Fruits canned with syrup. Vegetables Canned vegetables. Frozen vegetables with butter or cream sauce. Grains Refined white flour and flour products such as bread, pasta, snack foods, and cereals. Avoid all processed foods. Meats and other proteins Fatty cuts of meat. Poultry with skin. Breaded or fried meats. Processed meat. Avoid saturated fats. Dairy Full-fat yogurt, cheese, or milk. Beverages Sweetened drinks, such as soda or iced tea. The items listed above may not be a complete list of foods and beverages you should avoid. Contact a dietitian for more information. Questions to ask a health care provider Do I need to meet with a certified diabetes care and education specialist? Do I need to meet with a dietitian? What number can I call if I  have questions? When are the best times to check my blood glucose? Where to find more information: American Diabetes Association: diabetes.org Academy of Nutrition and Dietetics: eatright.[employer] of Diabetes and Digestive and Kidney Diseases: StageSync.si Association of Diabetes Care & Education Specialists: diabeteseducator.org Summary It is important to have healthy eating habits because your blood sugar (glucose) levels are greatly affected by what you eat and drink. It is important to use alcohol carefully. A healthy meal plan will help you manage your blood glucose and lower your risk of heart disease. Your health care provider may recommend that you work with a dietitian to make a meal plan that is best for you. This information is not intended to replace advice given to you by your health care provider. Make sure you discuss any questions you have with your health care provider. Document Revised: 08/27/2019 Document Reviewed: 08/27/2019 Elsevier Patient Education  2024 Elsevier Inc.     [name], MD [facility]

## 2022-10-30 NOTE — Assessment & Plan Note (Signed)
Secondary stroke prevention measures discussed Importance of diabetes and hypertension control addressed Need to take statin addressed Continues daily baby aspirin

## 2022-10-30 NOTE — Assessment & Plan Note (Signed)
Active and affecting quality of life Tender muscle spasm Recommend Flexeril 10 mg every 6-8 hours as needed.

## 2022-10-30 NOTE — Patient Instructions (Signed)

## 2022-11-01 ENCOUNTER — Ambulatory Visit
Admission: RE | Admit: 2022-11-01 | Discharge: 2022-11-01 | Disposition: A | Discharge status: Home / Self Care | Payer: Medicare HMO | Source: Ambulatory Visit | Attending: Emergency Medicine | Admitting: Emergency Medicine

## 2022-11-01 DIAGNOSIS — R221 Localized swelling, mass and lump, neck: Secondary | ICD-10-CM | POA: Diagnosis not present

## 2022-11-02 ENCOUNTER — Ambulatory Visit: Payer: BC Managed Care – PPO | Attending: Emergency Medicine

## 2022-11-06 ENCOUNTER — Other Ambulatory Visit: Payer: Self-pay

## 2022-11-06 ENCOUNTER — Other Ambulatory Visit: Payer: Self-pay | Admitting: Emergency Medicine

## 2022-11-06 ENCOUNTER — Other Ambulatory Visit (HOSPITAL_COMMUNITY): Payer: Self-pay

## 2022-11-06 DIAGNOSIS — E041 Nontoxic single thyroid nodule: Secondary | ICD-10-CM

## 2022-11-07 NOTE — Progress Notes (Signed)
Thank you :)

## 2022-11-10 ENCOUNTER — Telehealth: Payer: Self-pay | Admitting: Emergency Medicine

## 2022-11-10 NOTE — Telephone Encounter (Signed)
Patient returned Don's call about her imaging results. She would like a call back at [phone].

## 2022-11-10 NOTE — Telephone Encounter (Signed)
Called patient and left message for patient to call office back for results

## 2022-11-27 ENCOUNTER — Other Ambulatory Visit: Payer: Self-pay | Admitting: Emergency Medicine

## 2022-11-27 DIAGNOSIS — Z1231 Encounter for screening mammogram for malignant neoplasm of breast: Secondary | ICD-10-CM

## 2022-11-29 ENCOUNTER — Other Ambulatory Visit (HOSPITAL_COMMUNITY): Payer: Self-pay

## 2022-12-04 ENCOUNTER — Encounter (HOSPITAL_COMMUNITY): Payer: Self-pay

## 2022-12-04 ENCOUNTER — Other Ambulatory Visit: Payer: Self-pay

## 2022-12-04 ENCOUNTER — Emergency Department (HOSPITAL_COMMUNITY): Payer: BC Managed Care – PPO

## 2022-12-04 ENCOUNTER — Emergency Department (HOSPITAL_COMMUNITY)
Admission: EM | Admit: 2022-12-04 | Discharge: 2022-12-04 | Disposition: A | Discharge status: Home / Self Care | Payer: BC Managed Care – PPO | Attending: Student | Admitting: Student

## 2022-12-04 DIAGNOSIS — I10 Essential (primary) hypertension: Secondary | ICD-10-CM | POA: Insufficient documentation

## 2022-12-04 DIAGNOSIS — Z7984 Long term (current) use of oral hypoglycemic drugs: Secondary | ICD-10-CM | POA: Insufficient documentation

## 2022-12-04 DIAGNOSIS — Z8673 Personal history of transient ischemic attack (TIA), and cerebral infarction without residual deficits: Secondary | ICD-10-CM | POA: Insufficient documentation

## 2022-12-04 DIAGNOSIS — Z79899 Other long term (current) drug therapy: Secondary | ICD-10-CM | POA: Insufficient documentation

## 2022-12-04 DIAGNOSIS — Z7982 Long term (current) use of aspirin: Secondary | ICD-10-CM | POA: Diagnosis not present

## 2022-12-04 DIAGNOSIS — M25552 Pain in left hip: Secondary | ICD-10-CM | POA: Diagnosis not present

## 2022-12-04 DIAGNOSIS — E119 Type 2 diabetes mellitus without complications: Secondary | ICD-10-CM | POA: Diagnosis not present

## 2022-12-04 MED ORDER — NAPROXEN 500 MG PO TABS: 500.0000 mg | ORAL_TABLET | Freq: Two times a day (BID) | ORAL | 0 refills | Status: DC

## 2022-12-04 MED ORDER — KETOROLAC TROMETHAMINE 15 MG/ML IJ SOLN
15.0000 mg | Freq: Once | INTRAMUSCULAR | Status: AC
  Administered 2022-12-04: 15 mg via INTRAMUSCULAR
  Filled 2022-12-04: qty 1

## 2022-12-04 NOTE — Discharge Instructions (Signed)
Please read and follow all provided instructions.  Your diagnoses today include:  1. Left hip pain     Tests performed today include: An x-ray of the affected area - does NOT show any broken bones Vital signs. See below for your results today.   Medications prescribed:  Naproxen - anti-inflammatory pain medication Do not exceed 500mg  naproxen every 12 hours, take with food  You have been prescribed an anti-inflammatory medication or NSAID. Take with food. Take smallest effective dose for the shortest duration needed for your pain. Stop taking if you experience stomach pain or vomiting.   Take any prescribed medications only as directed.  Home care instructions:  Follow any educational materials contained in this packet Follow R.I.C.E. Protocol: R - rest your injury  I  - use ice on injury without applying directly to skin C - compress injury with bandage or splint E - elevate the injury as much as possible  Follow-up instructions: Please follow-up with your primary care provider if you continue to have significant pain in 1 week. In this case you may have a more severe injury that requires further care.   Return instructions:  Please return if your toes or feet are numb or tingling, appear gray or blue, or you have severe pain (also elevate the leg and loosen splint or wrap if you were given one) Please return to the Emergency Department if you experience worsening symptoms.  Please return if you have any other emergent concerns.  Additional Information:  Your vital signs today were: BP 124/70 (BP Location: Right Arm)   Pulse 81   Temp 97.8 F (36.6 C) (Oral)   Resp 19   Ht 6\' 1"  (1.854 m)   Wt 89.8 kg   SpO2 98%   BMI 26.12 kg/m  If your blood pressure (BP) was elevated above 135/85 this visit, please have this repeated by your doctor within one month. --------------

## 2022-12-04 NOTE — ED Triage Notes (Signed)
Yesterday pt began having left hip pain after sitting at church. Pt ambulatory. Pain does not radiate.

## 2022-12-04 NOTE — ED Provider Notes (Signed)
[place] EMERGENCY DEPARTMENT AT [facility] Provider Note   CSN: [id number] Arrival date & time: 12/04/22  0533     History  Chief Complaint  Patient presents with   Hip Pain    left    Megan Lane is a 67 y.o. female.  Patient with history of hypertension, diabetes, stroke, shoulder pain recently taken off of gabapentin currently taking Cymbalta and Flexeril --presents to the emergency department today for evaluation of left hip pain.  Symptoms started yesterday while at church.  Pain is worse with movement and certain positions.  It is over the outside of the hip.  No distal numbness or tingling.  Patient is ambulatory.  No back pain.  Patient denies warning symptoms of back pain including: fecal incontinence, urinary retention or overflow incontinence, night sweats, waking from sleep with pain, unexplained fevers or weight loss, h/o cancer, IVDU, recent trauma.          Home Medications Prior to Admission medications   Medication Sig Start Date End Date Taking? Authorizing Provider  aspirin EC 81 MG tablet Take 1 tablet (81 mg total) by mouth daily. Swallow whole. 11/01/20   Hoy Register, MD  azithromycin (ZITHROMAX) 250 MG tablet Sig as indicated Patient not taking: Reported on 10/30/2022 08/21/22   Georgina Quint, MD  benzonatate (TESSALON) 200 MG capsule Take 1 capsule (200 mg total) by mouth 2 (two) times daily as needed for cough. 08/21/22   Georgina Quint, MD  Blood Glucose Monitoring Suppl (CONTOUR NEXT MONITOR) w/Device KIT 1 each by Does not apply route 3 (three) times daily as needed. 09/16/21   Hoy Register, MD  cyclobenzaprine (FLEXERIL) 10 MG tablet Take 1 tablet (10 mg total) by mouth 3 (three) times daily as needed for muscle spasms. 10/30/22   Georgina Quint, MD  DULoxetine (CYMBALTA) 60 MG capsule Take 1 capsule (60 mg total) by mouth daily. 05/25/22   Georgina Quint, MD  empagliflozin (JARDIANCE) 25 MG TABS tablet  Take 1 tablet (25 mg total) by mouth daily before breakfast. 10/30/22   Georgina Quint, MD  glipiZIDE (GLUCOTROL) 10 MG tablet Take 1 tablet (10 mg total) by mouth 2 (two) times daily before a meal. 09/16/21   Hoy Register, MD  glucose blood (CONTOUR NEXT TEST) test strip Use as instructed 09/16/21   Hoy Register, MD  hydrochlorothiazide (HYDRODIURIL) 25 MG tablet Take 1 tablet (25 mg total) by mouth daily. 05/25/22   Georgina Quint, MD  ibuprofen (ADVIL) 600 MG tablet Take 1 tablet (600 mg total) by mouth 3 (three) times daily. Patient taking differently: Take 600 mg by mouth every 6 (six) hours as needed. 09/16/21   Hoy Register, MD  Lancets (ACCU-CHEK SOFT TOUCH) lancets Use as instructed 02/12/17   Lizbeth Bark, FNP  metoprolol tartrate (LOPRESSOR) 100 MG tablet Take 1 tablet (100 mg total) by mouth as directed. Take (1) tablet two hours before your CT scan 08/14/22   Jake Bathe, MD  rosuvastatin (CRESTOR) 20 MG tablet Take 1 tablet (20 mg total) by mouth daily. 10/30/22   Georgina Quint, MD  tirzepatide [facility]) 10 MG/0.5ML Pen Inject 10 mg into the skin once a week. 10/30/22   Georgina Quint, MD      Allergies    Penicillins    Review of Systems   Review of Systems  Physical Exam Updated Vital Signs BP 124/70 (BP Location: Right Arm)   Pulse 81   Temp  97.8 F (36.6 C) (Oral)   Resp 19   Ht 6\' 1"  (1.854 m)   Wt 89.8 kg   SpO2 98%   BMI 26.12 kg/m  Physical Exam Vitals and nursing note reviewed.  Constitutional:      Appearance: She is well-developed.  HENT:     Head: Normocephalic and atraumatic.  Eyes:     Pupils: Pupils are equal, round, and reactive to light.  Cardiovascular:     Pulses: Normal pulses. No decreased pulses.  Musculoskeletal:        General: Tenderness present.     Cervical back: Normal range of motion and neck supple.     Left hip: Tenderness present. No bony tenderness. Normal range of motion.        Legs:  Skin:    General: Skin is warm and dry.  Neurological:     Mental Status: She is alert.     Sensory: No sensory deficit.     Comments: Motor, sensation, and vascular distal to the injury is fully intact.   Psychiatric:        Mood and Affect: Mood normal.     ED Results / Procedures / Treatments   Labs (all labs ordered are listed, but only abnormal results are displayed) Labs Reviewed - No data to display  EKG None  Radiology DG Hip Unilat With Pelvis 2-3 Views Left  Result Date: 12/04/2022 CLINICAL DATA:  67 year old female with history of left-sided hip pain after sitting in church. EXAM: DG HIP (WITH OR WITHOUT PELVIS) 2-3V LEFT COMPARISON:  No priors. FINDINGS: There is no evidence of hip fracture or dislocation. There is no evidence of arthropathy or other focal bone abnormality. IMPRESSION: Negative. Electronically Signed   By: Trudie Reed M.D.   On: 12/04/2022 06:34    Procedures Procedures    Medications Ordered in ED Medications  ketorolac (TORADOL) 15 MG/ML injection 15 mg (15 mg Intramuscular Given 12/04/22 4098)    ED Course/ Medical Decision Making/ A&P    Patient seen and examined.  Patient states that she is feeling better.  History obtained directly from patient. Work-up including labs, imaging, EKG ordered in triage, if performed, were reviewed.    Seen on arrival by Dr. Audrie Lia.   Labs/EKG: None ordered  Imaging: Independently reviewed and interpreted.  This included: X-ray of the left hip and pelvis, agree negative, no dislocation.  Medications/Fluids: IM Toradol ordered previously  Most recent vital signs reviewed and are as follows: BP 124/70 (BP Location: Right Arm)   Pulse 81   Temp 97.8 F (36.6 C) (Oral)   Resp 19   Ht 6\' 1"  (1.854 m)   Wt 89.8 kg   SpO2 98%   BMI 26.12 kg/m   Initial impression: Left pain  Home treatment plan: RICE protocol, trial NSAIDs  Return instructions discussed with patient: New or worsening  symptoms, worsening pain, fevers, inability to walk  Follow-up instructions discussed with patient: Follow-up with PCP in the next 1 week for recheck if not feeling better with conservative therapy                                Medical Decision Making Amount and/or Complexity of Data Reviewed Radiology: ordered.  Risk Prescription drug management.   Patient with left lateral hip pain, likely musculoskeletal in nature.  Features are not consistent with radiculopathy.  No signs of rash or cellulitis.  Patient with  good range of motion.  X-ray negative for fracture or dislocation.  No medical emergency suspected.  Patient feeling better with IM Toradol.  Will continue short course of anti-inflammatories and have patient follow-up with her PCP as needed.  No concern for deep venous thrombosis or arterial insufficiency at this time given exam.  Symptoms would be very atypical for these issues.   The patient's vital signs, pertinent lab work and imaging were reviewed and interpreted as discussed in the ED course. Hospitalization was considered for further testing, treatments, or serial exams/observation. However as patient is well-appearing, has a stable exam, and reassuring studies today, I do not feel that they warrant admission at this time. This plan was discussed with the patient who verbalizes agreement and comfort with this plan and seems reliable and able to return to the Emergency Department with worsening or changing symptoms.          Final Clinical Impression(s) / ED Diagnoses Final diagnoses:  Left hip pain    Rx / DC Orders ED Discharge Orders          Ordered    naproxen (NAPROSYN) 500 MG tablet  2 times daily        12/04/22 0703              Renne Crigler, PA-C 12/04/22 0981    Glendora Score, MD 12/05/22 [phone]

## 2022-12-07 ENCOUNTER — Ambulatory Visit (INDEPENDENT_AMBULATORY_CARE_PROVIDER_SITE_OTHER): Payer: BC Managed Care – PPO | Admitting: Emergency Medicine

## 2022-12-07 ENCOUNTER — Other Ambulatory Visit (HOSPITAL_COMMUNITY): Payer: Self-pay

## 2022-12-07 ENCOUNTER — Encounter: Payer: Self-pay | Admitting: Emergency Medicine

## 2022-12-07 VITALS — BP 120/84 | HR 84 | Temp 98.1°F | Ht 73.0 in | Wt 209.5 lb

## 2022-12-07 DIAGNOSIS — M25552 Pain in left hip: Secondary | ICD-10-CM | POA: Diagnosis not present

## 2022-12-07 DIAGNOSIS — M5432 Sciatica, left side: Secondary | ICD-10-CM | POA: Diagnosis not present

## 2022-12-07 MED ORDER — TRAMADOL HCL 50 MG PO TABS
50.0000 mg | ORAL_TABLET | Freq: Three times a day (TID) | ORAL | 0 refills | Status: AC | PRN
Start: 2022-12-07 — End: 2022-12-12
  Filled 2022-12-07: qty 15, 5d supply, fill #0

## 2022-12-07 MED ORDER — PREDNISONE 20 MG PO TABS
20.0000 mg | ORAL_TABLET | Freq: Every day | ORAL | 0 refills | Status: AC
Start: 2022-12-07 — End: 2022-12-12
  Filled 2022-12-07: qty 5, 5d supply, fill #0

## 2022-12-07 NOTE — Assessment & Plan Note (Signed)
Recent x-rays reviewed Differential diagnosis discussed Will benefit from prednisone 20 mg daily for 5 days Pain management discussed Needs orthopedic evaluation Referral placed today

## 2022-12-07 NOTE — Progress Notes (Signed)
Megan Lane 67 y.o.   Chief Complaint  Patient presents with   Back Pain    Patient states she is having some sciatica pain. She states it started Sunday     HISTORY OF PRESENT ILLNESS: This is a 67 y.o. female complaining of pain to left hip and left lumbar area that started about 5 days ago.  Was seen in the emergency department last Monday, 3 days ago, for the same.  X-rays unremarkable.  Was given injection of Toradol and later sent home Still having the same symptoms.  Denies bowel or bladder symptoms.  Denies additional different symptoms. No other complaints or medical concerns today.  Back Pain Pertinent negatives include no abdominal pain, chest pain, dysuria, fever or headaches.     Prior to Admission medications   Medication Sig Start Date End Date Taking? Authorizing Provider  aspirin EC 81 MG tablet Take 1 tablet (81 mg total) by mouth daily. Swallow whole. 11/01/20  Yes Hoy Register, MD  benzonatate (TESSALON) 200 MG capsule Take 1 capsule (200 mg total) by mouth 2 (two) times daily as needed for cough. 08/21/22  Yes Cadey Bazile, Eilleen Kempf, MD  Blood Glucose Monitoring Suppl (CONTOUR NEXT MONITOR) w/Device KIT 1 each by Does not apply route 3 (three) times daily as needed. 09/16/21  Yes Hoy Register, MD  cyclobenzaprine (FLEXERIL) 10 MG tablet Take 1 tablet (10 mg total) by mouth 3 (three) times daily as needed for muscle spasms. 10/30/22  Yes Fatima Fedie, Eilleen Kempf, MD  DULoxetine (CYMBALTA) 60 MG capsule Take 1 capsule (60 mg total) by mouth daily. 05/25/22  Yes Yuette Putnam, Eilleen Kempf, MD  empagliflozin (JARDIANCE) 25 MG TABS tablet Take 1 tablet (25 mg total) by mouth daily before breakfast. 10/30/22  Yes Delsy Etzkorn, Eilleen Kempf, MD  gabapentin (NEURONTIN) 300 MG capsule Take 600 mg by mouth 3 (three) times daily. 12/04/22  Yes [provider]  glipiZIDE (GLUCOTROL) 10 MG tablet Take 1 tablet (10 mg total) by mouth 2 (two) times daily before a meal. 09/16/21  Yes  Newlin, Enobong, MD  glucose blood (CONTOUR NEXT TEST) test strip Use as instructed 09/16/21  Yes Newlin, Enobong, MD  hydrochlorothiazide (HYDRODIURIL) 25 MG tablet Take 1 tablet (25 mg total) by mouth daily. 05/25/22  Yes Navon Kotowski, Eilleen Kempf, MD  Lancets (ACCU-CHEK SOFT TOUCH) lancets Use as instructed 02/12/17  Yes Hairston, Oren Beckmann, FNP  metoprolol tartrate (LOPRESSOR) 100 MG tablet Take 1 tablet (100 mg total) by mouth as directed. Take (1) tablet two hours before your CT scan 08/14/22  Yes Jake Bathe, MD  naproxen (NAPROSYN) 500 MG tablet Take 1 tablet (500 mg total) by mouth 2 (two) times daily. 12/04/22  Yes Renne Crigler, PA-C  predniSONE (DELTASONE) 20 MG tablet Take 1 tablet (20 mg total) by mouth daily with breakfast for 5 days. 12/07/22 12/12/22 Yes Davonne Jarnigan, Eilleen Kempf, MD  rosuvastatin (CRESTOR) 20 MG tablet Take 1 tablet (20 mg total) by mouth daily. 10/30/22  Yes Peighton Mehra, Eilleen Kempf, MD  tirzepatide [facility]) 10 MG/0.5ML Pen Inject 10 mg into the skin once a week. 10/30/22  Yes Nikolus Marczak, Eilleen Kempf, MD  traMADol (ULTRAM) 50 MG tablet Take 1 tablet (50 mg total) by mouth every 8 (eight) hours as needed for up to 5 days. 12/07/22 12/12/22 Yes SagardiaEilleen Kempf, MD  azithromycin [facility]) 250 MG tablet Sig as indicated Patient not taking: Reported on 12/07/2022 08/21/22   Georgina Quint, MD    Allergies  Allergen Reactions  Penicillins Rash    Did it involve swelling of the face/tongue/throat, SOB, or low BP? {[id number] Did it involve sudden or severe rash/hives, skin peeling, or any reaction on the inside of your mouth or nose? Y Did you need to seek medical attention at a hospital or doctor's office? Y When did it last happen?  Several years ago     If all above answers are "NO", may proceed with cephalosporin use.     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   HLD (hyperlipidemia) 07/08/2016   Dyslipidemia associated with type 2 diabetes mellitus (HCC) 10/16/2013   Essential hypertension, benign 04/30/2013    Past Medical History:  Diagnosis Date   Asthma    as a child   Diabetes mellitus    Hypercholesterolemia    Hypertension    Stroke (HCC) 04/2021   TIA   Ventral hernia    Wears glasses     Past Surgical History:  Procedure Laterality Date   ABDOMINAL HYSTERECTOMY     COLONOSCOPY  2012   normal , at [facility]   KNEE SURGERY     LEG SURGERY     left lower extremity   TUBAL LIGATION     VENTRAL HERNIA REPAIR N/A 05/29/2019   Procedure: REPAIR OF VENTRAL HERNIA WITH MESH;  Surgeon: Harriette Bouillon, MD;  Location: [facility] OR;  Service: General;  Laterality: N/A;    Social History   Socioeconomic History   Marital status: Single    Spouse name: Not on file   Number of children: Not on file   Years of education: Not on file   Highest education level: Not on file  Occupational History   Occupation: Part time (04/13/20)  Tobacco Use   Smoking status: Former    Current packs/day: 0.00    Average packs/day: 3.0 packs/day for 35.0 years (105.0 ttl pk-yrs)    Types: Cigarettes    Start date: 08/12/1970    Quit date: 08/11/2005    Years since quitting: 17.3   Smokeless tobacco: Never   Tobacco comments:    Age 44 start  Vaping Use   Vaping status: Never Used  Substance and Sexual Activity   Alcohol use: Never   Drug use: Never   Sexual activity: Not Currently    Birth control/protection: Post-menopausal, Surgical  Other Topics Concern   Not on file  Social History Narrative   Lives with son   Right Handed   Drinks rarely caffeine   Social Determinants of Health   Financial  Resource Strain: Not on file  Food Insecurity: Not on file  Transportation Needs: Not on file  Physical Activity: Not on file  Stress: Not on file  Social Connections: Not on file  Intimate Partner Violence: Not on file    Family History  Problem Relation Age of Onset   Diabetes Mother    Hypertension Mother    Stroke Mother    Breast cancer Sister 31   Heart disease Brother    Colon cancer Neg Hx    Colon polyps Neg Hx    Esophageal cancer Neg Hx    Rectal cancer Neg Hx  Stomach cancer Neg Hx      Review of Systems  Constitutional: Negative.  Negative for chills and fever.  HENT: Negative.  Negative for congestion and sore throat.   Respiratory: Negative.  Negative for cough and shortness of breath.   Cardiovascular: Negative.  Negative for chest pain and palpitations.  Gastrointestinal:  Negative for abdominal pain, diarrhea, nausea and vomiting.  Genitourinary: Negative.  Negative for dysuria and hematuria.  Musculoskeletal:  Positive for back pain.  Skin: Negative.  Negative for rash.  Neurological: Negative.  Negative for dizziness and headaches.  All other systems reviewed and are negative.   Vitals:   12/07/22 1440  BP: 120/84  Pulse: 84  Temp: 98.1 F (36.7 C)  SpO2: 95%    Physical Exam Vitals reviewed.  Constitutional:      Appearance: Normal appearance.  HENT:     Head: Normocephalic.  Eyes:     Extraocular Movements: Extraocular movements intact.  Cardiovascular:     Rate and Rhythm: Normal rate.  Pulmonary:     Effort: Pulmonary effort is normal.  Abdominal:     Palpations: Abdomen is soft.     Tenderness: There is no abdominal tenderness.  Musculoskeletal:     Comments: Back: Left-sided muscular tenderness lumbar spine Left hip: Some tenderness to palpation  Skin:    General: Skin is warm and dry.  Neurological:     Mental Status: She is alert and oriented to person, place, and time.  Psychiatric:        Mood and Affect: Mood  normal.        Behavior: Behavior normal.      ASSESSMENT & PLAN: A total of 34 minutes was spent with the patient and counseling/coordination of care regarding preparing for this visit, review of most recent office visit notes, review of chronic medical conditions under management, review of all medications, review of most recent x-ray reports from emergency department, pain management, diagnosis of sciatica and differential diagnosis, need for orthopedic evaluation, prognosis, documentation, and need for follow-up.  Problem List Items Addressed This Visit       Nervous and Auditory   Left sided sciatica    Recent x-rays reviewed Differential diagnosis discussed Will benefit from prednisone 20 mg daily for 5 days Pain management discussed Needs orthopedic evaluation Referral placed today      Relevant Medications   gabapentin (NEURONTIN) 300 MG capsule   traMADol (ULTRAM) 50 MG tablet   predniSONE (DELTASONE) 20 MG tablet   Other Relevant Orders   Ambulatory referral to Orthopedic Surgery     Other   Left hip pain - Primary    Tender to palpation.  Unremarkable x-rays done on 12/04/2022 Pain management discussed. Continue muscle relaxant and start tramadol 50 mg as needed for pain Recommend orthopedic evaluation Referral placed today      Relevant Medications   traMADol (ULTRAM) 50 MG tablet   predniSONE (DELTASONE) 20 MG tablet   Other Relevant Orders   Ambulatory referral to Orthopedic Surgery   Patient Instructions  Sciatica  Sciatica is pain, weakness, tingling, or loss of feeling (numbness) along the sciatic nerve. The sciatic nerve starts in the lower back and goes down the back of each leg. Sciatica usually affects one side of the body. Sciatica usually goes away on its own or with treatment. Sometimes, sciatica may come back. What are the causes? This condition happens when the sciatic nerve is pinched or has pressure put on it. This may be caused  by: A  disk in between the bones of the spine bulging out too far (herniated disk). Changes in the spinal disks due to aging. A condition that affects a muscle in the butt. Extra bone growth near the sciatic nerve. A break (fracture) of the area between your hip bones (pelvis). Pregnancy. Tumor. This is rare. What increases the risk? You are more likely to develop this condition if you: Play sports that put pressure or stress on the spine. Have poor strength and ease of movement (flexibility). Have had a back injury or back surgery. Sit for long periods of time. Do activities that involve bending or lifting over and over again. Are very overweight (obese). What are the signs or symptoms? Symptoms can vary from mild to very bad. They may include: Any of these problems in the lower back, leg, hip, or butt: Mild tingling, loss of feeling, or dull aches. A burning feeling. Sharp pains. Loss of feeling in the back of the calf or the sole of the foot. Leg weakness. Very bad back pain that makes it hard to move. These symptoms may get worse when you cough, sneeze, or laugh. They may also get worse when you sit or stand for long periods of time. How is this treated? This condition often gets better without any treatment. However, treatment may include: Changing or cutting back on physical activity when you have pain. Exercising, including strengthening and stretching. Putting ice or heat on the affected area. Shots of medicines to relieve pain and swelling or to relax your muscles. Surgery. Follow these instructions at home: Medicines Take over-the-counter and prescription medicines only as told by your doctor. Ask your doctor if you should avoid driving or using machines while you are taking your medicine. Managing pain     If told, put ice on the affected area. To do this: Put ice in a plastic bag. Place a towel between your skin and the bag. Leave the ice on for 20 minutes, 2-3 times a  day. If your skin turns bright red, take off the ice right away to prevent skin damage. The risk of skin damage is higher if you cannot feel pain, heat, or cold. If told, put heat on the affected area. Do this as often as told by your doctor. Use the heat source that your doctor tells you to use, such as a moist heat pack or a heating pad. Place a towel between your skin and the heat source. Leave the heat on for 20-30 minutes. If your skin turns bright red, take off the heat right away to prevent burns. The risk of burns is higher if you cannot feel pain, heat, or cold. Activity  Return to your normal activities when your doctor says that it is safe. Avoid activities that make your symptoms worse. Take short rests during the day. When you rest for a long time, do some physical activity or stretching between periods of rest. Avoid sitting for a long time without moving. Get up and move around at least one time each hour. Do exercises and stretches as told by your doctor. Do not lift anything that is heavier than 10 lb (4.5 kg). Avoid lifting heavy things even when you do not have symptoms. Avoid lifting heavy things over and over. When you lift objects, always lift in a way that is safe for your body. To do this, you should: Bend your knees. Keep the object close to your body. Avoid twisting. General instructions Stay at a  healthy weight. Wear comfortable shoes that support your feet. Avoid wearing high heels. Avoid sleeping on a mattress that is too soft or too hard. You might have less pain if you sleep on a mattress that is firm enough to support your back. Contact a doctor if: Your pain is not controlled by medicine. Your pain does not get better. Your pain gets worse. Your pain lasts longer than 4 weeks. You lose weight without trying. Get help right away if: You cannot control when you pee (urinate) or poop (have a bowel movement). You have weakness in any of these areas and it  gets worse: Lower back. The area between your hip bones. Butt. Legs. You have redness or swelling of your back. You have a burning feeling when you pee. Summary Sciatica is pain, weakness, tingling, or loss of feeling (numbness) along the sciatic nerve. This may include the lower back, legs, hips, and butt. This condition happens when the sciatic nerve is pinched or has pressure put on it. Treatment often includes rest, exercise, medicines, and putting ice or heat on the affected area. This information is not intended to replace advice given to you by your health care provider. Make sure you discuss any questions you have with your health care provider. Document Revised: 05/02/2021 Document Reviewed: 05/02/2021 Elsevier Patient Education  2024 [employer]      [name], MD [facility]

## 2022-12-07 NOTE — Patient Instructions (Signed)
Sciatica  Sciatica is pain, weakness, tingling, or loss of feeling (numbness) along the sciatic nerve. The sciatic nerve starts in the lower back and goes down the back of each leg. Sciatica usually affects one side of the body. Sciatica usually goes away on its own or with treatment. Sometimes, sciatica may come back. What are the causes? This condition happens when the sciatic nerve is pinched or has pressure put on it. This may be caused by: A disk in between the bones of the spine bulging out too far (herniated disk). Changes in the spinal disks due to aging. A condition that affects a muscle in the butt. Extra bone growth near the sciatic nerve. A break (fracture) of the area between your hip bones (pelvis). Pregnancy. Tumor. This is rare. What increases the risk? You are more likely to develop this condition if you: Play sports that put pressure or stress on the spine. Have poor strength and ease of movement (flexibility). Have had a back injury or back surgery. Sit for long periods of time. Do activities that involve bending or lifting over and over again. Are very overweight (obese). What are the signs or symptoms? Symptoms can vary from mild to very bad. They may include: Any of these problems in the lower back, leg, hip, or butt: Mild tingling, loss of feeling, or dull aches. A burning feeling. Sharp pains. Loss of feeling in the back of the calf or the sole of the foot. Leg weakness. Very bad back pain that makes it hard to move. These symptoms may get worse when you cough, sneeze, or laugh. They may also get worse when you sit or stand for long periods of time. How is this treated? This condition often gets better without any treatment. However, treatment may include: Changing or cutting back on physical activity when you have pain. Exercising, including strengthening and stretching. Putting ice or heat on the affected area. Shots of medicines to relieve pain and  swelling or to relax your muscles. Surgery. Follow these instructions at home: Medicines Take over-the-counter and prescription medicines only as told by your doctor. Ask your doctor if you should avoid driving or using machines while you are taking your medicine. Managing pain     If told, put ice on the affected area. To do this: Put ice in a plastic bag. Place a towel between your skin and the bag. Leave the ice on for 20 minutes, 2-3 times a day. If your skin turns bright red, take off the ice right away to prevent skin damage. The risk of skin damage is higher if you cannot feel pain, heat, or cold. If told, put heat on the affected area. Do this as often as told by your doctor. Use the heat source that your doctor tells you to use, such as a moist heat pack or a heating pad. Place a towel between your skin and the heat source. Leave the heat on for 20-30 minutes. If your skin turns bright red, take off the heat right away to prevent burns. The risk of burns is higher if you cannot feel pain, heat, or cold. Activity  Return to your normal activities when your doctor says that it is safe. Avoid activities that make your symptoms worse. Take short rests during the day. When you rest for a long time, do some physical activity or stretching between periods of rest. Avoid sitting for a long time without moving. Get up and move around at least one time each   hour. Do exercises and stretches as told by your doctor. Do not lift anything that is heavier than 10 lb (4.5 kg). Avoid lifting heavy things even when you do not have symptoms. Avoid lifting heavy things over and over. When you lift objects, always lift in a way that is safe for your body. To do this, you should: Bend your knees. Keep the object close to your body. Avoid twisting. General instructions Stay at a healthy weight. Wear comfortable shoes that support your feet. Avoid wearing high heels. Avoid sleeping on a mattress  that is too soft or too hard. You might have less pain if you sleep on a mattress that is firm enough to support your back. Contact a doctor if: Your pain is not controlled by medicine. Your pain does not get better. Your pain gets worse. Your pain lasts longer than 4 weeks. You lose weight without trying. Get help right away if: You cannot control when you pee (urinate) or poop (have a bowel movement). You have weakness in any of these areas and it gets worse: Lower back. The area between your hip bones. Butt. Legs. You have redness or swelling of your back. You have a burning feeling when you pee. Summary Sciatica is pain, weakness, tingling, or loss of feeling (numbness) along the sciatic nerve. This may include the lower back, legs, hips, and butt. This condition happens when the sciatic nerve is pinched or has pressure put on it. Treatment often includes rest, exercise, medicines, and putting ice or heat on the affected area. This information is not intended to replace advice given to you by your health care provider. Make sure you discuss any questions you have with your health care provider. Document Revised: 05/02/2021 Document Reviewed: 05/02/2021 Elsevier Patient Education  2024 Elsevier Inc.  

## 2022-12-07 NOTE — Assessment & Plan Note (Signed)
Tender to palpation.  Unremarkable x-rays done on 12/04/2022 Pain management discussed. Continue muscle relaxant and start tramadol 50 mg as needed for pain Recommend orthopedic evaluation Referral placed today

## 2022-12-11 ENCOUNTER — Other Ambulatory Visit: Payer: Self-pay | Admitting: Family Medicine

## 2022-12-11 DIAGNOSIS — E1140 Type 2 diabetes mellitus with diabetic neuropathy, unspecified: Secondary | ICD-10-CM

## 2022-12-19 ENCOUNTER — Other Ambulatory Visit (INDEPENDENT_AMBULATORY_CARE_PROVIDER_SITE_OTHER): Payer: Self-pay

## 2022-12-19 ENCOUNTER — Ambulatory Visit (INDEPENDENT_AMBULATORY_CARE_PROVIDER_SITE_OTHER): Payer: BC Managed Care – PPO | Admitting: Physician Assistant

## 2022-12-19 ENCOUNTER — Other Ambulatory Visit (HOSPITAL_COMMUNITY): Payer: Self-pay

## 2022-12-19 DIAGNOSIS — M25552 Pain in left hip: Secondary | ICD-10-CM | POA: Diagnosis not present

## 2022-12-19 MED ORDER — METHOCARBAMOL 750 MG PO TABS
750.0000 mg | ORAL_TABLET | Freq: Two times a day (BID) | ORAL | 2 refills | Status: DC | PRN
  Filled 2022-12-19: qty 20, 10d supply, fill #0
  Filled 2023-05-20: qty 20, 10d supply, fill #1
  Filled 2023-07-08 – 2023-07-20 (×2): qty 20, 10d supply, fill #2

## 2022-12-19 MED ORDER — METHYLPREDNISOLONE 4 MG PO TBPK
ORAL_TABLET | ORAL | 0 refills | Status: AC
  Filled 2022-12-19: qty 21, 6d supply, fill #0

## 2022-12-19 NOTE — Progress Notes (Signed)
Office Visit Note   Patient: Megan Lane           Date of Birth: 12-15-1955           MRN: [id number] Visit Date: 12/19/2022              Requested by: Georgina Quint, MD [address] [place],  [place] [address] PCP: Georgina Quint, MD   Assessment & Plan: Visit Diagnoses:  1. Left hip pain     Plan: Impression is chronic low back and left hip pain referred from the lumbar spine.  We have discussed various treatment options to include steroid pack and muscle relaxers as well as physical therapy.  She is agreeable with this plan.  If her symptoms do not improve, she will let us know we will get an MRI of the lumbar spine.  Follow-up as needed.  Follow-Up Instructions: Return if symptoms worsen or fail to improve.   Orders:  Orders Placed This Encounter  Procedures   XR Lumbar Spine 2-3 Views   No orders of the defined types were placed in this encounter.     Procedures: No procedures performed   Clinical Data: No additional findings.   Subjective: Chief Complaint  Patient presents with   Lower Back - Pain    HPI patient is a pleasant 67 year old female who comes in today with pain to the middle of the back radiating down the left lateral hip to the lateral knee.  Symptoms have been ongoing for several weeks.  She denies any injury or change in activity.  She denies any pain into the groin or anterior thigh.  Symptoms are worse when she is standing.  She denies any weakness to either lower extremity.  She does note numbness to the left knee.  No bowel or bladder change or saddle paresthesias.  Review of Systems as detailed in HPI.  All others reviewed and are negative.   Objective: Vital Signs: There were no vitals taken for this visit.  Physical Exam well-developed well-nourished female thin no acute distress.  Alert and oriented x 3.  Ortho Exam examination of the lumbar spine: No spinous or paraspinous tenderness.  No pain with lumbar  flexion or extension.  Negative straight leg raise.  No pain with logroll, FADIR or Stinchfield testing.  No focal weakness.  She is neurovascular intact distally.  Specialty Comments:  No specialty comments available.  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   HLD (hyperlipidemia) 07/08/2016   Dyslipidemia associated with type 2 diabetes mellitus (HCC) 10/16/2013   Essential hypertension, benign 04/30/2013   Past Medical History:  Diagnosis Date   Asthma    as a child   Diabetes mellitus    Hypercholesterolemia    Hypertension    Stroke (HCC) 04/2021   TIA   Ventral hernia    Wears glasses     Family History  Problem Relation Age of Onset   Diabetes Mother  Hypertension Mother    Stroke Mother    Breast cancer Sister 38   Heart disease Brother    Colon cancer Neg Hx    Colon polyps Neg Hx    Esophageal cancer Neg Hx    Rectal cancer Neg Hx    Stomach cancer Neg Hx     Past Surgical History:  Procedure Laterality Date   ABDOMINAL HYSTERECTOMY     COLONOSCOPY  2012   normal , at [facility]   KNEE SURGERY     LEG SURGERY     left lower extremity   TUBAL LIGATION     VENTRAL HERNIA REPAIR N/A 05/29/2019   Procedure: REPAIR OF VENTRAL HERNIA WITH MESH;  Surgeon: Harriette Bouillon, MD;  Location: [facility] OR;  Service: General;  Laterality: N/A;   Social History   Occupational History   Occupation: Part time (04/13/20)  Tobacco Use   Smoking status:  Former    Current packs/day: 0.00    Average packs/day: 3.0 packs/day for 35.0 years (105.0 ttl pk-yrs)    Types: Cigarettes    Start date: 08/12/1970    Quit date: 08/11/2005    Years since quitting: 17.3   Smokeless tobacco: Never   Tobacco comments:    Age 66 start  Vaping Use   Vaping status: Never Used  Substance and Sexual Activity   Alcohol use: Never   Drug use: Never   Sexual activity: Not Currently    Birth control/protection: Post-menopausal, Surgical

## 2022-12-20 ENCOUNTER — Ambulatory Visit
Admission: RE | Admit: 2022-12-20 | Discharge: 2022-12-20 | Disposition: A | Discharge status: Home / Self Care | Payer: BC Managed Care – PPO | Source: Ambulatory Visit | Attending: Emergency Medicine | Admitting: Emergency Medicine

## 2022-12-20 ENCOUNTER — Ambulatory Visit: Payer: BC Managed Care – PPO

## 2022-12-20 DIAGNOSIS — Z1231 Encounter for screening mammogram for malignant neoplasm of breast: Secondary | ICD-10-CM

## 2022-12-27 ENCOUNTER — Other Ambulatory Visit (HOSPITAL_COMMUNITY): Payer: Self-pay

## 2022-12-27 MED ORDER — AZITHROMYCIN 250 MG PO TABS
ORAL_TABLET | ORAL | 0 refills | Status: AC
  Filled 2022-12-27: qty 6, 5d supply, fill #0

## 2022-12-28 NOTE — Therapy (Signed)
OUTPATIENT PHYSICAL THERAPY THORACOLUMBAR EVALUATION  Referring diagnosis? Diagnosis M25.552 (ICD-10-CM) - Left hip pain Treatment diagnosis? (if different than referring diagnosis) R29.3   R26.2   M62.81   M25.551   M25.552 What was this (referring dx) caused by? []  Surgery []  Fall []  Ongoing issue [x]  Arthritis []  Other: ____________  Laterality: [x]  Rt [x]  Lt [x]  Both  Check all possible CPT codes:  *CHOOSE 10 OR LESS*    See Planned Interventions listed in the Plan section of the Evaluation.     Patient Name: HASSET STONIS MRN: [id number] DOB:April 23, 1955, 67 y.o.,, female Today's Date: 01/02/2023  END OF SESSION:  PT End of Session - 01/02/23 1629     Visit Number 1    Number of Visits 12    Date for PT Re-Evaluation 02/27/23    Authorization Type [employer] and [employer]    Progress Note Due on Visit 12    PT Start Time 1427    PT Stop Time 1515    PT Time Calculation (min) 48 min    Activity Tolerance Patient tolerated treatment well;No increased pain    Behavior During Therapy [facility] for tasks assessed/performed             Past Medical History:  Diagnosis Date   Asthma    as a child   Diabetes mellitus    Hypercholesterolemia    Hypertension    Stroke (HCC) 04/2021   TIA   Ventral hernia    Wears glasses    Past Surgical History:  Procedure Laterality Date   ABDOMINAL HYSTERECTOMY     COLONOSCOPY  2012   normal , at [facility]   KNEE SURGERY     LEG SURGERY     left lower extremity   TUBAL LIGATION     VENTRAL HERNIA REPAIR N/A 05/29/2019   Procedure: REPAIR OF VENTRAL HERNIA WITH MESH;  Surgeon: Harriette Bouillon, MD;  Location: [facility] OR;  Service: General;  Laterality: N/A;   Patient Active Problem List   Diagnosis Date Noted   Left hip pain 12/07/2022   Left sided sciatica 12/07/2022   Lump in neck 10/30/2022   Muscle spasm of right shoulder 08/21/2022   Chronic right shoulder pain 08/21/2022   Hypertension associated with diabetes (HCC)  12/07/2021   History of stroke 12/07/2021   Diabetic polyneuropathy associated with type 2 diabetes mellitus (HCC) 12/07/2021   Trochlear nerve disease, 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    SUBJECTIVE:  SUBJECTIVE STATEMENT: Rosann notes new right sided low back pain along with her Left sided symptoms of about 2 years duration.  Since the steroid (dose pack), the left side has been much better.  When her left side is flared-up, she uses a cane to walk and stand.  PERTINENT HISTORY:  Asthma, Type 2 DM, diabetic polyneuropathy, HLD, HTN, stroke, hernia repair, hysterectomy  PAIN:  Are you having pain? Yes: NPRS scale: 4-10/10 with right side 4/10 and left side 0/10 since steroid dose pack on a 10/10 Pain location: Rt > Lt  lateral hip Pain description: Achy Aggravating factors: Standing too long Relieving factors: Tylenol, steroids  PRECAUTIONS: Back  RED FLAGS: None   WEIGHT BEARING RESTRICTIONS: No  FALLS:  Has patient fallen in last 6 months? No  LIVING ENVIRONMENT: Lives with: lives with their family and niece Lives in: House/apartment Stairs:  OK without UE support Has following equipment at home: Single point cane  OCCUPATION: Retiring at the end of  January 2025  PLOF: Independent  PATIENT GOALS: Be able to enjoy retirement without being stopped by back or hip pain  NEXT MD VISIT: Nothing scheduled  OBJECTIVE:  Note: Objective measures were completed at Evaluation unless otherwise noted.  DIAGNOSTIC FINDINGS:  FINDINGS: There is no evidence of hip fracture or dislocation. There is no evidence of arthropathy or other focal bone abnormality.  PATIENT SURVEYS:  FOTO 57 (risk-adjusted 56, Goal 69 in 12 visits)  SCREENING FOR RED FLAGS: Bowel or bladder incontinence: No Spinal tumors: No Cauda equina syndrome: No Compression fracture: No Abdominal aneurysm: No  COGNITION: Overall cognitive status: Within functional limits for tasks assessed     SENSATION: Not since steroid dose pack.  Symptoms were to the left knee  MUSCLE LENGTH: Hamstrings: Right 40 deg; Left 40 deg  POSTURE: rounded shoulders and flexed trunk   LUMBAR ROM:   AROM 01/02/2023  Flexion   Extension 5  Right lateral flexion 30  Left lateral flexion 30  Right rotation   Left rotation    (Blank rows = not tested)  LOWER EXTREMITY ROM:     Passive  Left/Right 01/02/2023   Hip flexion 85/80   Hip extension    Hip abduction    Hip adduction    Hip internal rotation 10/7   Hip external rotation 16/27   Knee flexion    Knee extension    Ankle dorsiflexion    Ankle plantarflexion    Ankle inversion    Ankle eversion     (Blank rows = not tested)  STRENGTH:  Deferred at evaluation due to difficulty with test positions  MMT Left/Right 01/02/2023   Hip flexion    Hip extension    Hip abduction    Hip adduction    Hip internal rotation    Hip external rotation    Knee flexion    Knee extension    Ankle dorsiflexion    Ankle plantarflexion    Ankle inversion    Ankle eversion     (Blank rows = not tested)  GAIT: Distance walked: 100 feet Assistive device utilized: None Level of assistance: Complete Independence Comments: Very  flexed at hips, rounded shoulders  TODAY'S TREATMENT:  DATE: 01/02/2023 Single knee-to-chest stretch with opposite leg straight 2 x 20 seconds Yoga bridge 10 x 5 seconds Standing lumbar extension AROM 10 x 3 seconds Scapular retraction/shoulder blade pinches 10 x 5 seconds  Functional Activities: Reviewed exam findings, spine anatomy, postural basics including logroll and day 1 home exercise program   PATIENT EDUCATION:  Education details: See above Person educated: Patient Education method: Explanation, Demonstration, Tactile cues, Verbal cues, and Handouts Education comprehension: verbalized understanding, returned demonstration, verbal cues required, tactile cues required, and needs further education  HOME EXERCISE PROGRAM: Access Code: [email or web address] URL: [email or web address] Date: 01/02/2023 Prepared by: Pauletta Browns  Exercises - Single Knee to Chest Stretch  - 2-3 x daily - 7 x weekly - 1 sets - 5 reps - 20 seconds hold - Yoga Bridge  - 2-3 x daily - 7 x weekly - 1 sets - 10 reps - 5 seconds hold - Standing Lumbar Extension at Wall - Forearms  - 5 x daily - 7 x weekly - 1 sets - 5 reps - 3 seconds hold - Standing Scapular Retraction  - 5 x daily - 7 x weekly - 1 sets - 5 reps - 5 second hold  ASSESSMENT:  CLINICAL IMPRESSION: Patient is a 67 y.o. female who was seen today for physical therapy evaluation and treatment for Diagnosis M25.552 (ICD-10-CM) - Left hip pain.  Lorenna has a flexed posture that contributes to her difficulty with prolonged standing at work.  She is doing much better after a steroid Dosepak, although anticipate the benefits of this should wear off over the next 2 weeks.  Schwanna will benefit from supervised physical therapy to address her flexed posture when standing, including tight hip musculature and weak postural  musculature.  Her prognosis to meet the below listed goals is good with the recommended plan of care.  OBJECTIVE IMPAIRMENTS: Abnormal gait, decreased activity tolerance, decreased endurance, decreased knowledge of condition, difficulty walking, decreased ROM, decreased strength, decreased safety awareness, impaired perceived functional ability, increased muscle spasms, impaired flexibility, improper body mechanics, postural dysfunction, and pain.   ACTIVITY LIMITATIONS: carrying, lifting, bending, standing, squatting, bed mobility, and locomotion level  PARTICIPATION LIMITATIONS: cleaning, community activity, and occupation  PERSONAL FACTORS: Asthma, Type 2 DM, diabetic polyneuropathy, HLD, HTN, stroke, hernia repair, hysterectomy are also affecting patient's functional outcome.   REHAB POTENTIAL: Good  CLINICAL DECISION MAKING: Stable/uncomplicated  EVALUATION COMPLEXITY: Low   GOALS: Goals reviewed with patient? Yes  SHORT TERM GOALS: Target date: 01/30/2023  Deboria will be independent with her day 1 HEP Baseline: Started 01/02/2023 Goal status: INITIAL  2.  Improve lumbar extension AROM to 10 degrees Baseline: 5 degrees Goal status: INITIAL  3.  Improve bilateral lower extremity flexibility for hip flexors to at least 90 degrees; hamstrings to at least 45 degrees and hip external rotation to at least 35 degrees Baseline: 85/80; 40/40 and 16/27 respectively Goal status: INITIAL  LONG TERM GOALS: Target date: 02/27/2023  Improve FOTO to 57 (risk-adjusted 56, Goal 69 in 12 visits) Baseline: 57, risk-adjusted 56 Goal status: INITIAL  2.  Torri will have an improved postural awareness and will be able to implement this into all standing and seated activities Baseline: Very flexed posture when standing at evaluation Goal status: INITIAL  3.  Paysli will report Bilateral hip pain consistently 0-3/10 on the numeric pain rating scale Baseline: 4-10/10 pre-steroid Dosepak Goal  status: INITIAL  4.  Heidi will have improved postural strength as assessed by pain scores, FOTO and improved  standing endurance Baseline: See above Goal status: INITIAL  5.  Laguisha will be independent with her long-term maintenance HEP at DC Baseline: Started 01/02/2023 Goal status: INITIAL  PLAN:  PT FREQUENCY: 1-2x/week  PT DURATION: 8 weeks  PLANNED INTERVENTIONS: 97110-Therapeutic exercises, 97530- Therapeutic activity, 97112- Neuromuscular re-education, 97535- Self Care, 21308- Manual therapy, Patient/Family education, Dry Needling, Spinal mobilization, Cryotherapy, and Moist heat.  PLAN FOR NEXT SESSION: Review day 1 HEP.  Progress postural and low back strengthening to correct flexed posture, particularly noted in standing as this affects her significantly.   Cherlyn Cushing, PT, MPT 01/02/2023, 4:49 PM

## 2023-01-02 ENCOUNTER — Ambulatory Visit: Payer: BC Managed Care – PPO | Admitting: Rehabilitative and Restorative Service Providers"

## 2023-01-02 ENCOUNTER — Encounter: Payer: Self-pay | Admitting: Rehabilitative and Restorative Service Providers"

## 2023-01-02 DIAGNOSIS — M25552 Pain in left hip: Secondary | ICD-10-CM | POA: Diagnosis not present

## 2023-01-02 DIAGNOSIS — R293 Abnormal posture: Secondary | ICD-10-CM

## 2023-01-02 DIAGNOSIS — M6281 Muscle weakness (generalized): Secondary | ICD-10-CM

## 2023-01-02 DIAGNOSIS — M25551 Pain in right hip: Secondary | ICD-10-CM | POA: Diagnosis not present

## 2023-01-02 DIAGNOSIS — R262 Difficulty in walking, not elsewhere classified: Secondary | ICD-10-CM | POA: Diagnosis not present

## 2023-01-10 ENCOUNTER — Telehealth: Payer: Self-pay | Admitting: Emergency Medicine

## 2023-01-10 ENCOUNTER — Telehealth: Payer: Self-pay | Admitting: Rehabilitative and Restorative Service Providers"

## 2023-01-10 ENCOUNTER — Encounter: Payer: BC Managed Care – PPO | Admitting: Rehabilitative and Restorative Service Providers"

## 2023-01-10 NOTE — Telephone Encounter (Signed)
Pt would like for the Glipizide 10mg  to refill as well and from here on out she want all her medications going to [facility].

## 2023-01-10 NOTE — Telephone Encounter (Signed)
Okay to refill as requested.

## 2023-01-10 NOTE — Telephone Encounter (Signed)
Prescription Request  01/10/2023  LOV: 12/07/2022  What is the name of the medication or equipment? gabapentin (NEURONTIN) 300 MG capsule    Have you contacted your pharmacy to request a refill? No    [facility] [address] [place] [address] Phone: [phone] Fax: [phone] pharmacy would you like this sent to?     Patient notified that their request is being sent to the clinical staff for review and that they should receive a response within 2 business days.   Please advise at Mobile [phone] (mobile)

## 2023-01-10 NOTE — Therapy (Incomplete)
OUTPATIENT PHYSICAL THERAPY TREATMENT    Patient Name: Megan Lane MRN: [id number] DOB:1955/09/02, 67 y.o., female Today's Date: 01/10/2023  END OF SESSION:    Past Medical History:  Diagnosis Date   Asthma    as a child   Diabetes mellitus    Hypercholesterolemia    Hypertension    Stroke (HCC) 04/2021   TIA   Ventral hernia    Wears glasses    Past Surgical History:  Procedure Laterality Date   ABDOMINAL HYSTERECTOMY     COLONOSCOPY  2012   normal , at [facility]   KNEE SURGERY     LEG SURGERY     left lower extremity   TUBAL LIGATION     VENTRAL HERNIA REPAIR N/A 05/29/2019   Procedure: REPAIR OF VENTRAL HERNIA WITH MESH;  Surgeon: Harriette Bouillon, MD;  Location: [facility] OR;  Service: General;  Laterality: N/A;   Patient Active Problem List   Diagnosis Date Noted   Left hip pain 12/07/2022   Left sided sciatica 12/07/2022   Lump in neck 10/30/2022   Muscle spasm of right shoulder 08/21/2022   Chronic right shoulder pain 08/21/2022   Hypertension associated with diabetes (HCC) 12/07/2021   History of stroke 12/07/2021   Diabetic polyneuropathy associated with type 2 diabetes mellitus (HCC) 12/07/2021   Trochlear nerve disease, 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  ONSET DATE: Left sided symptoms for about 2 years.    SUBJECTIVE:                                                                                                                                                                                           SUBJECTIVE STATEMENT: Megan Lane notes new right sided low back pain along with her Left  sided symptoms of about 2 years duration.  Since the steroid (dose pack), the left side has been much better.  When her left side is flared-up, she uses a cane to walk and stand.  PERTINENT HISTORY:  Asthma, Type 2 DM, diabetic polyneuropathy, HLD, HTN, stroke, hernia repair, hysterectomy  PAIN:  Are you having pain? Yes: NPRS scale: 4-10/10 with right side 4/10 and left side 0/10 since steroid dose pack on a 10/10 Pain location: Rt > Lt  lateral hip Pain description: Achy Aggravating factors: Standing too long  Relieving factors: Tylenol, steroids  PRECAUTIONS: Back  RED FLAGS: None   WEIGHT BEARING RESTRICTIONS: No  FALLS:  Has patient fallen in last 6 months? No  LIVING ENVIRONMENT: Lives with: lives with their family and niece Lives in: House/apartment Stairs:  OK without UE support Has following equipment at home: Single point cane  OCCUPATION: Retiring at the end of January 2025  PLOF: Independent  PATIENT GOALS: Be able to enjoy retirement without being stopped by back or hip pain  NEXT MD VISIT: Nothing scheduled  OBJECTIVE:  Note: Objective measures were completed at Evaluation unless otherwise noted.  DIAGNOSTIC FINDINGS:  01/02/2023 review:  FINDINGS: There is no evidence of hip fracture or dislocation. There is no evidence of arthropathy or other focal bone abnormality.  PATIENT SURVEYS:  01/02/2023 FOTO 57 (risk-adjusted 56, Goal 69 in 12 visits)  SCREENING FOR RED FLAGS: 01/02/2023 Bowel or bladder incontinence: No Spinal tumors: No Cauda equina syndrome: No Compression fracture: No Abdominal aneurysm: No  COGNITION: 01/02/2023 Overall cognitive status: Within functional limits for tasks assessed     SENSATION: 01/02/2023 Not since steroid dose pack.  Symptoms were to the left knee  MUSCLE LENGTH: 01/02/2023 Hamstrings: Right 40 deg; Left 40 deg  POSTURE:  11/26/2024rounded shoulders and flexed trunk   LUMBAR ROM:   AROM  01/02/2023 01/10/2023  Flexion    Extension 5   Right lateral flexion 30   Left lateral flexion 30   Right rotation    Left rotation     (Blank rows = not tested)  LOWER EXTREMITY ROM:     Passive  Left/Right 01/02/2023   Hip flexion 85/80   Hip extension    Hip abduction    Hip adduction    Hip internal rotation 10/7   Hip external rotation 16/27   Knee flexion    Knee extension    Ankle dorsiflexion    Ankle plantarflexion    Ankle inversion    Ankle eversion     (Blank rows = not tested)  STRENGTH:  Deferred at evaluation due to difficulty with test positions  MMT Left/Right 01/02/2023   Hip flexion    Hip extension    Hip abduction    Hip adduction    Hip internal rotation    Hip external rotation    Knee flexion    Knee extension    Ankle dorsiflexion    Ankle plantarflexion    Ankle inversion    Ankle eversion     (Blank rows = not tested)  GAIT: 01/02/2023 Distance walked: 100 feet Assistive device utilized: None Level of assistance: Complete Independence Comments: Very flexed at hips, rounded shoulders                    TODAY'S TREATMENT:      DATE: 01/10/2023 Therex:     TODAY'S TREATMENT:      DATE: 01/02/2023 Single knee-to-chest stretch with opposite leg straight 2 x 20 seconds Yoga bridge 10 x 5 seconds Standing lumbar extension AROM 10 x 3 seconds Scapular retraction/shoulder blade pinches 10 x 5 seconds  Functional Activities: Reviewed exam findings, spine anatomy, postural basics including logroll and day 1 home exercise program   PATIENT EDUCATION:  01/02/2023 Education details: See above Person educated: Patient Education method: Explanation, Demonstration, Tactile cues, Verbal cues, and Handouts Education comprehension: verbalized understanding, returned demonstration, verbal cues required, tactile cues required, and needs further education  HOME EXERCISE PROGRAM: Access Code: [email or web address] URL:  [email or web address] Date: 01/02/2023  Prepared by: Pauletta Browns  Exercises - Single Knee to Chest Stretch  - 2-3 x daily - 7 x weekly - 1 sets - 5 reps - 20 seconds hold - Yoga Bridge  - 2-3 x daily - 7 x weekly - 1 sets - 10 reps - 5 seconds hold - Standing Lumbar Extension at Wall - Forearms  - 5 x daily - 7 x weekly - 1 sets - 5 reps - 3 seconds hold - Standing Scapular Retraction  - 5 x daily - 7 x weekly - 1 sets - 5 reps - 5 second hold  ASSESSMENT:  CLINICAL IMPRESSION:    OBJECTIVE IMPAIRMENTS: Abnormal gait, decreased activity tolerance, decreased endurance, decreased knowledge of condition, difficulty walking, decreased ROM, decreased strength, decreased safety awareness, impaired perceived functional ability, increased muscle spasms, impaired flexibility, improper body mechanics, postural dysfunction, and pain.   ACTIVITY LIMITATIONS: carrying, lifting, bending, standing, squatting, bed mobility, and locomotion level  PARTICIPATION LIMITATIONS: cleaning, community activity, and occupation  PERSONAL FACTORS: Asthma, Type 2 DM, diabetic polyneuropathy, HLD, HTN, stroke, hernia repair, hysterectomy are also affecting patient's functional outcome.   REHAB POTENTIAL: Good  CLINICAL DECISION MAKING: Stable/uncomplicated  EVALUATION COMPLEXITY: Low   GOALS: Goals reviewed with patient? Yes  SHORT TERM GOALS: Target date: 01/30/2023  Millicent will be independent with her day 1 HEP Baseline: Started 01/02/2023 Goal status: on going 01/10/2023  2.  Improve lumbar extension AROM to 10 degrees Baseline: 5 degrees Goal status: on going 01/10/2023  3.  Improve bilateral lower extremity flexibility for hip flexors to at least 90 degrees; hamstrings to at least 45 degrees and hip external rotation to at least 35 degrees Baseline: 85/80; 40/40 and 16/27 respectively Goal status: on going 01/10/2023  LONG TERM GOALS: Target date: 02/27/2023  Improve FOTO to 57  (risk-adjusted 56, Goal 69 in 12 visits) Baseline: 57, risk-adjusted 56 Goal status: INITIAL  2.  Eria will have an improved postural awareness and will be able to implement this into all standing and seated activities Baseline: Very flexed posture when standing at evaluation Goal status: INITIAL  3.  Henleigh will report Bilateral hip pain consistently 0-3/10 on the numeric pain rating scale Baseline: 4-10/10 pre-steroid Dosepak Goal status: INITIAL  4.  Heidi will have improved postural strength as assessed by pain scores, FOTO and improved standing endurance Baseline: See above Goal status: INITIAL  5.  Nyiesha will be independent with her long-term maintenance HEP at DC Baseline: Started 01/02/2023 Goal status: INITIAL  PLAN:  PT FREQUENCY: 1-2x/week  PT DURATION: 8 weeks  PLANNED INTERVENTIONS: 97110-Therapeutic exercises, 97530- Therapeutic activity, 97112- Neuromuscular re-education, 97535- Self Care, 40981- Manual therapy, Patient/Family education, Dry Needling, Spinal mobilization, Cryotherapy, and Moist heat.  PLAN FOR NEXT SESSION:      Chyrel Masson, PT, DPT, OCS, ATC 01/10/23  1:35 PM   Referring diagnosis? Diagnosis M25.552 (ICD-10-CM) - Left hip pain Treatment diagnosis? (if different than referring diagnosis) R29.3   R26.2   M62.81   M25.551   M25.552 What was this (referring dx) caused by? []  Surgery []  Fall []  Ongoing issue [x]  Arthritis []  Other: ____________  Laterality: [x]  Rt [x]  Lt [x]  Both  Check all possible CPT codes:  *CHOOSE 10 OR LESS*    See Planned Interventions listed in the Plan section of the Evaluation.

## 2023-01-10 NOTE — Telephone Encounter (Signed)
Called patient after 15 mins no show for appointment today.  Left message and reminder of next appointment time, as well as call back number.   Chyrel Masson, PT, DPT, OCS, ATC 01/10/23  2:05 PM  1st no show in treatment cycle.

## 2023-01-11 ENCOUNTER — Other Ambulatory Visit: Payer: Self-pay | Admitting: Radiology

## 2023-01-11 DIAGNOSIS — E1140 Type 2 diabetes mellitus with diabetic neuropathy, unspecified: Secondary | ICD-10-CM

## 2023-01-12 ENCOUNTER — Other Ambulatory Visit: Payer: Self-pay

## 2023-01-12 ENCOUNTER — Other Ambulatory Visit: Payer: Self-pay | Admitting: Radiology

## 2023-01-12 ENCOUNTER — Other Ambulatory Visit (HOSPITAL_BASED_OUTPATIENT_CLINIC_OR_DEPARTMENT_OTHER): Payer: Self-pay

## 2023-01-12 ENCOUNTER — Other Ambulatory Visit (HOSPITAL_COMMUNITY): Payer: Self-pay

## 2023-01-12 DIAGNOSIS — E1140 Type 2 diabetes mellitus with diabetic neuropathy, unspecified: Secondary | ICD-10-CM

## 2023-01-12 MED ORDER — GABAPENTIN 300 MG PO CAPS
600.0000 mg | ORAL_CAPSULE | Freq: Three times a day (TID) | ORAL | 0 refills | Status: DC
Start: 2023-01-12 — End: 2023-02-21
  Filled 2023-01-12: qty 30, 5d supply, fill #0

## 2023-01-12 MED ORDER — GLIPIZIDE 10 MG PO TABS
10.0000 mg | ORAL_TABLET | Freq: Two times a day (BID) | ORAL | 0 refills | Status: DC
Start: 2023-01-12 — End: 2023-02-21
  Filled 2023-01-12: qty 180, 90d supply, fill #0

## 2023-01-12 NOTE — Telephone Encounter (Signed)
Rx refill sent.

## 2023-01-15 ENCOUNTER — Telehealth: Payer: Self-pay | Admitting: Physical Therapy

## 2023-01-15 ENCOUNTER — Encounter: Payer: BC Managed Care – PPO | Admitting: Physical Therapy

## 2023-01-15 NOTE — Telephone Encounter (Signed)
No-show for today's PT appt (2nd in a row). Called and spoke to pt directly, she reported she was on the way. Educated about [facility]'s 15 minute late policy and that sessions past this time are generally not beneficial for PT, also discussed time/date of next PT appt- she endorses she will be at that appt.   Did not educate on policy of dropping to one visit at a time at this point due to multiple no-shows today, we can talk about this at next visit.   Nedra Hai, PT, DPT 01/15/23 1:22 PM

## 2023-01-17 ENCOUNTER — Other Ambulatory Visit (HOSPITAL_COMMUNITY): Payer: Self-pay

## 2023-01-17 MED ORDER — NYSTATIN-TRIAMCINOLONE 100000-0.1 UNIT/GM-% EX CREA
1.0000 | TOPICAL_CREAM | Freq: Four times a day (QID) | CUTANEOUS | 12 refills | Status: DC
  Filled 2023-01-17: qty 15, 4d supply, fill #0
  Filled 2023-05-20: qty 15, 4d supply, fill #1
  Filled 2023-09-08 – 2023-09-24 (×2): qty 15, 4d supply, fill #2

## 2023-01-24 ENCOUNTER — Encounter: Payer: BC Managed Care – PPO | Admitting: Rehabilitative and Restorative Service Providers"

## 2023-02-01 ENCOUNTER — Encounter: Payer: BC Managed Care – PPO | Admitting: Rehabilitative and Restorative Service Providers"

## 2023-02-01 ENCOUNTER — Telehealth: Payer: Self-pay | Admitting: Rehabilitative and Restorative Service Providers"

## 2023-02-01 NOTE — Telephone Encounter (Signed)
Left VM cancelling remaining 2 appointments due to 3 straight no shows after evaluation.  Left [phone] number to call if questions.

## 2023-02-06 ENCOUNTER — Other Ambulatory Visit (HOSPITAL_COMMUNITY): Payer: Self-pay

## 2023-02-08 ENCOUNTER — Encounter: Payer: BC Managed Care – PPO | Admitting: Rehabilitative and Restorative Service Providers"

## 2023-02-09 ENCOUNTER — Encounter: Payer: Self-pay | Admitting: Rehabilitative and Restorative Service Providers"

## 2023-02-09 ENCOUNTER — Ambulatory Visit (INDEPENDENT_AMBULATORY_CARE_PROVIDER_SITE_OTHER): Payer: BC Managed Care – PPO | Admitting: Rehabilitative and Restorative Service Providers"

## 2023-02-09 DIAGNOSIS — M25551 Pain in right hip: Secondary | ICD-10-CM

## 2023-02-09 DIAGNOSIS — M25552 Pain in left hip: Secondary | ICD-10-CM

## 2023-02-09 DIAGNOSIS — R262 Difficulty in walking, not elsewhere classified: Secondary | ICD-10-CM | POA: Diagnosis not present

## 2023-02-09 DIAGNOSIS — R293 Abnormal posture: Secondary | ICD-10-CM

## 2023-02-09 DIAGNOSIS — M6281 Muscle weakness (generalized): Secondary | ICD-10-CM | POA: Diagnosis not present

## 2023-02-09 NOTE — Therapy (Signed)
 OUTPATIENT PHYSICAL THERAPY TREATMENT/DISCHARGE  PHYSICAL THERAPY DISCHARGE SUMMARY  Visits from Start of Care: 2  Current functional level related to goals / functional outcomes: See note   Remaining deficits: See note   Education / Equipment: Updated HEP   Patient agrees to discharge. Patient goals were met. Patient is being discharged due to being pleased with the current functional level.   Patient Name: Megan Lane MRN: [id number] DOB:1955/06/09, 68 y.o., female Today's Date: 02/09/2023  END OF SESSION:  PT End of Session - 02/09/23 1640     Visit Number 2    Number of Visits 12    Date for PT Re-Evaluation 02/27/23    Authorization Type [employer] and [employer]    Progress Note Due on Visit 12    PT Start Time 1432    PT Stop Time 1516    PT Time Calculation (min) 44 min    Activity Tolerance Patient tolerated treatment well;No increased pain    Behavior During Therapy [facility] for tasks assessed/performed            Past Medical History:  Diagnosis Date   Asthma    as a child   Diabetes mellitus    Hypercholesterolemia    Hypertension    Stroke (HCC) 04/2021   TIA   Ventral hernia    Wears glasses    Past Surgical History:  Procedure Laterality Date   ABDOMINAL HYSTERECTOMY     COLONOSCOPY  2012   normal , at [facility]   KNEE SURGERY     LEG SURGERY     left lower extremity   TUBAL LIGATION     VENTRAL HERNIA REPAIR N/A 05/29/2019   Procedure: REPAIR OF VENTRAL HERNIA WITH MESH;  Surgeon: Vanderbilt Ned, MD;  Location: [facility] OR;  Service: General;  Laterality: N/A;   Patient Active Problem List   Diagnosis Date Noted   Left hip pain 12/07/2022   Left sided sciatica 12/07/2022   Lump in neck 10/30/2022   Muscle spasm of right shoulder 08/21/2022   Chronic right shoulder pain 08/21/2022   Hypertension associated with diabetes (HCC) 12/07/2021   History of stroke 12/07/2021   Diabetic polyneuropathy associated with type 2 diabetes mellitus (HCC)  12/07/2021   Trochlear nerve disease, 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    SUBJECTIVE:  SUBJECTIVE STATEMENT: Megan Lane notes she has had no back pain over the past 2-3 weeks.   Megan Lane notes new right sided low back pain along with her Left sided symptoms of about 2 years duration.  Since the steroid (dose pack), the left side has been much better.  When her left side is flared-up, she uses a cane to walk and stand.  PERTINENT HISTORY:  Asthma, Type 2 DM, diabetic polyneuropathy, HLD, HTN, stroke, hernia repair, hysterectomy  PAIN:  Are you having pain? Yes: NPRS scale: 0/10 over the past 2-3 weeks (was  4/10 and left side 0/10 since steroid dose pack) on a 10/10 Pain location: Rt > Lt  lateral hip Pain description: Achy Aggravating factors: Standing too long Relieving factors: Tylenol , steroids  PRECAUTIONS: Back  RED FLAGS: None   WEIGHT BEARING RESTRICTIONS: No  FALLS:  Has patient fallen in last 6 months? No  LIVING ENVIRONMENT: Lives with: lives with their family and niece Lives in: House/apartment Stairs:  OK without UE support Has following equipment at home: Single point cane  OCCUPATION: Retiring at the end of January 2025  PLOF:  Independent  PATIENT GOALS: Be able to enjoy retirement without being stopped by back or hip pain  NEXT MD VISIT: Nothing scheduled  OBJECTIVE:  Note: Objective measures were completed at Evaluation unless otherwise noted.  DIAGNOSTIC FINDINGS:  01/02/2023 review:  FINDINGS: There is no evidence of hip fracture or dislocation. There is no evidence of arthropathy or other focal bone abnormality.  PATIENT SURVEYS:  02/09/2023 FOTO 85 (Goal met)  01/02/2023 FOTO 57 (risk-adjusted 56, Goal 69 in 12 visits)  SCREENING FOR RED FLAGS: 01/02/2023 Bowel or bladder incontinence: No Spinal tumors: No Cauda equina syndrome: No Compression fracture: No Abdominal aneurysm: No  COGNITION: 01/02/2023 Overall cognitive status: Within functional limits for tasks assessed     SENSATION: 01/02/2023 Not since steroid dose pack.  Symptoms were to the left knee  MUSCLE LENGTH:  02/09/2023  Hamstrings: Right 60 deg; Left 60 deg  01/02/2023 Hamstrings: Right 40 deg; Left 40 deg  POSTURE:  11/26/2024rounded shoulders and flexed trunk   LUMBAR ROM:   AROM 01/02/2023 02/09/2023  Flexion    Extension 5 15  Right lateral flexion 30   Left lateral flexion 30   Right rotation    Left rotation     (Blank rows = not tested)  LOWER EXTREMITY ROM:     Passive  Left/Right 01/02/2023 02/09/2023 Left/Right  Hip flexion 85/80 95/95  Hip extension    Hip abduction    Hip adduction    Hip internal rotation 10/7 10/10  Hip external rotation 16/27 26/29  Knee flexion    Knee extension    Ankle dorsiflexion    Ankle plantarflexion    Ankle inversion    Ankle eversion     (Blank rows = not tested)  STRENGTH:  Deferred at evaluation due to difficulty with test positions  MMT Left/Right 01/02/2023   Hip flexion    Hip extension    Hip abduction    Hip adduction    Hip internal rotation    Hip external rotation    Knee flexion    Knee extension    Ankle dorsiflexion    Ankle  plantarflexion    Ankle inversion    Ankle eversion     (Blank rows = not tested)  GAIT: 01/02/2023 Distance walked: 100 feet Assistive device utilized: None Level of assistance: Complete Independence Comments: Very flexed at hips, rounded  shoulders                    TODAY'S TREATMENT:      DATE: 02/09/2023 Single knee-to-chest stretch with opposite leg straight 2 x 20 seconds Yoga bridge 10 x 5 seconds Standing lumbar extension AROM 10 x 3 seconds Scapular retraction/shoulder blade pinches 10 x 5 seconds Hip hike at counter top 2 sets of 10 for 3 seconds Figure 4 stretch 5 x 20 seconds   Functional Activities:   Reviewed re-exam findings, spine anatomy, postural basics including logroll and updates to her long-term maintenance home exercise program   01/02/2023 Single knee-to-chest stretch with opposite leg straight 2 x 20 seconds Yoga bridge 10 x 5 seconds Standing lumbar extension AROM 10 x 3 seconds Scapular retraction/shoulder blade pinches 10 x 5 seconds  Functional Activities: Reviewed exam findings, spine anatomy, postural basics including logroll and day 1 home exercise program   PATIENT EDUCATION:  01/02/2023 Education details: See above Person educated: Patient Education method: Explanation, Demonstration, Tactile cues, Verbal cues, and Handouts Education comprehension: verbalized understanding, returned demonstration, verbal cues required, tactile cues required, and needs further education  HOME EXERCISE PROGRAM: Access Code: [email or web address] URL: [email or web address] Date: 01/02/2023 Prepared by: Lamar Ivory  Exercises - Single Knee to Chest Stretch  - 2-3 x daily - 7 x weekly - 1 sets - 5 reps - 20 seconds hold - Yoga Bridge  - 2-3 x daily - 7 x weekly - 1 sets - 10 reps - 5 seconds hold - Standing Lumbar Extension at Wall - Forearms  - 5 x daily - 7 x weekly - 1 sets - 5 reps - 3 seconds hold - Standing Scapular Retraction  - 5 x daily - 7 x  weekly - 1 sets - 5 reps - 5 second hold  ASSESSMENT:  CLINICAL IMPRESSION: Riannah notes that she has not had any back pain in about 2 to 3 weeks.  She reports compliance with her home exercises and we made some slight progressions based on objective findings today.  Mickie reports that she feels comfortable with progressions added today and she feels like she can continue these independently.  Hazelgrace was given the option of transfer into independent rehabilitation versus another follow-up in a week or 2 to see how she is doing with new exercises.  She chooses to discharge from supervised physical therapy and transfer into independent rehabilitation.  Dahiana is welcome to return for further rehabilitation if requested.   OBJECTIVE IMPAIRMENTS: Abnormal gait, decreased activity tolerance, decreased endurance, decreased knowledge of condition, difficulty walking, decreased ROM, decreased strength, decreased safety awareness, impaired perceived functional ability, increased muscle spasms, impaired flexibility, improper body mechanics, postural dysfunction, and pain.   ACTIVITY LIMITATIONS: carrying, lifting, bending, standing, squatting, bed mobility, and locomotion level  PARTICIPATION LIMITATIONS: cleaning, community activity, and occupation  PERSONAL FACTORS: Asthma, Type 2 DM, diabetic polyneuropathy, HLD, HTN, stroke, hernia repair, hysterectomy are also affecting patient's functional outcome.   REHAB POTENTIAL: Good  CLINICAL DECISION MAKING: Stable/uncomplicated  EVALUATION COMPLEXITY: Low   GOALS: Goals reviewed with patient? Yes  SHORT TERM GOALS: Target date: 01/30/2023  Yolani will be independent with her day 1 HEP Baseline: Started 01/02/2023 Goal status: Met 02/09/2023  2.  Improve lumbar extension AROM to 10 degrees Baseline: 5 degrees Goal status: Met 02/09/2023  3.  Improve bilateral lower extremity flexibility for hip flexors to at least 90 degrees; hamstrings to at  least 45 degrees and hip external rotation to  at least 35 degrees Baseline: 85/80; 40/40 and 16/27 respectively Goal status: Partially met 02/09/2023  LONG TERM GOALS: Target date: 02/27/2023  Improve FOTO to 57 (risk-adjusted 56, Goal 69 in 12 visits) Baseline: 57, risk-adjusted 56 Goal status: Met 02/09/2023  2.  Ventura will have an improved postural awareness and will be able to implement this into all standing and seated activities Baseline: Very flexed posture when standing at evaluation Goal status: Met 02/09/2023  3.  Akiera will report Bilateral hip pain consistently 0-3/10 on the numeric pain rating scale Baseline: 4-10/10 pre-steroid Dosepak Goal status: Met 02/09/2023  4.  Heidi will have improved postural strength as assessed by pain scores, FOTO and improved standing endurance Baseline: See above Goal status: Met 02/09/2023  5.  Niya will be independent with her long-term maintenance HEP at DC Baseline: Started 01/02/2023 Goal status: Met 02/09/2023  PLAN:  PT FREQUENCY: DC  PT DURATION: DC  PLANNED INTERVENTIONS: 97110-Therapeutic exercises, 97530- Therapeutic activity, 97112- Neuromuscular re-education, 97535- Self Care, 02859- Manual therapy, Patient/Family education, Dry Needling, Spinal mobilization, Cryotherapy, and Moist heat.  PLAN FOR NEXT SESSION: DC    Myer LELON Ivory PT, MPT 02/09/23  4:49 PM   Referring diagnosis? Diagnosis M25.552 (ICD-10-CM) - Left hip pain Treatment diagnosis? (if different than referring diagnosis) R29.3   R26.2   M62.81   M25.551   M25.552 What was this (referring dx) caused by? []  Surgery []  Fall []  Ongoing issue [x]  Arthritis []  Other: ____________  Laterality: [x]  Rt [x]  Lt [x]  Both  Check all possible CPT codes:  *CHOOSE 10 OR LESS*    See Planned Interventions listed in the Plan section of the Evaluation.

## 2023-02-12 ENCOUNTER — Ambulatory Visit: Payer: BC Managed Care – PPO | Admitting: Emergency Medicine

## 2023-02-15 ENCOUNTER — Encounter: Payer: BC Managed Care – PPO | Admitting: Rehabilitative and Restorative Service Providers"

## 2023-02-20 ENCOUNTER — Telehealth: Payer: Self-pay

## 2023-02-20 NOTE — Progress Notes (Signed)
 Care Guide Pharmacy Note  02/20/2023 Name: Megan Lane MRN: [id number] DOB: 1955-04-25  Referred By: Purcell Emil Schanz, MD Reason for referral: Care Coordination ([facility] Diabetes. )   Megan Lane is a 68 y.o. year old female who is a primary care patient of Sagardia, Miguel Jose, MD.  Megan Lane was referred to the pharmacist for assistance related to: DMII  An unsuccessful telephone outreach was attempted today to contact the patient who was referred to the pharmacy team for assistance with diabetes. Additional attempts will be made to contact the patient.  Dreama [facility] Assistant [phone]

## 2023-02-21 ENCOUNTER — Other Ambulatory Visit (HOSPITAL_COMMUNITY): Payer: Self-pay

## 2023-02-21 ENCOUNTER — Encounter: Payer: Self-pay | Admitting: Emergency Medicine

## 2023-02-21 ENCOUNTER — Ambulatory Visit: Payer: BC Managed Care – PPO | Admitting: Emergency Medicine

## 2023-02-21 VITALS — BP 110/70 | HR 84 | Temp 98.6°F | Ht 73.0 in | Wt 208.0 lb

## 2023-02-21 DIAGNOSIS — E785 Hyperlipidemia, unspecified: Secondary | ICD-10-CM

## 2023-02-21 DIAGNOSIS — I152 Hypertension secondary to endocrine disorders: Secondary | ICD-10-CM

## 2023-02-21 DIAGNOSIS — E1142 Type 2 diabetes mellitus with diabetic polyneuropathy: Secondary | ICD-10-CM | POA: Diagnosis not present

## 2023-02-21 DIAGNOSIS — E1140 Type 2 diabetes mellitus with diabetic neuropathy, unspecified: Secondary | ICD-10-CM

## 2023-02-21 DIAGNOSIS — E1159 Type 2 diabetes mellitus with other circulatory complications: Secondary | ICD-10-CM | POA: Diagnosis not present

## 2023-02-21 DIAGNOSIS — E1169 Type 2 diabetes mellitus with other specified complication: Secondary | ICD-10-CM | POA: Diagnosis not present

## 2023-02-21 DIAGNOSIS — Z7984 Long term (current) use of oral hypoglycemic drugs: Secondary | ICD-10-CM

## 2023-02-21 DIAGNOSIS — Z7985 Long-term (current) use of injectable non-insulin antidiabetic drugs: Secondary | ICD-10-CM

## 2023-02-21 DIAGNOSIS — Z8673 Personal history of transient ischemic attack (TIA), and cerebral infarction without residual deficits: Secondary | ICD-10-CM | POA: Diagnosis not present

## 2023-02-21 LAB — POCT GLYCOSYLATED HEMOGLOBIN (HGB A1C): Hemoglobin A1C: 6.7 % — AB (ref 4.0–5.6)

## 2023-02-21 MED ORDER — GABAPENTIN 300 MG PO CAPS
600.0000 mg | ORAL_CAPSULE | Freq: Three times a day (TID) | ORAL | 3 refills | Status: DC
  Filled 2023-02-21: qty 180, 30d supply, fill #0
  Filled 2023-03-23: qty 180, 30d supply, fill #1
  Filled 2023-04-22: qty 180, 30d supply, fill #2
  Filled 2023-05-20: qty 180, 30d supply, fill #3
  Filled 2023-07-08 – 2023-07-20 (×2): qty 180, 30d supply, fill #4
  Filled 2023-08-21: qty 180, 30d supply, fill #5
  Filled 2023-09-24: qty 180, 30d supply, fill #6
  Filled 2023-10-30: qty 180, 30d supply, fill #7
  Filled 2023-12-14: qty 540, 90d supply, fill #8
  Filled 2024-01-09: qty 180, 30d supply, fill #8

## 2023-02-21 MED ORDER — GLIPIZIDE 10 MG PO TABS
10.0000 mg | ORAL_TABLET | Freq: Two times a day (BID) | ORAL | 3 refills | Status: DC
  Filled 2023-02-21 – 2023-11-02 (×2): qty 180, 90d supply, fill #0

## 2023-02-21 NOTE — Assessment & Plan Note (Signed)
Secondary stroke prevention measures discussed Importance of diabetes and hypertension control addressed Need to take statin addressed Continues daily baby aspirin

## 2023-02-21 NOTE — Assessment & Plan Note (Signed)
 Well-controlled hypertension Continue hydrochlorothiazide 25 mg daily Well-controlled diabetes with hemoglobin A1c of 6.7 Continue Jardiance 25 mg daily, glipizide 10 mg twice a day and weekly Mounjaro 10 mg Cardiovascular risks associated with diabetes and hypertension discussed Diet and nutrition discussed Follow-up in 6 months

## 2023-02-21 NOTE — Assessment & Plan Note (Signed)
Stable.  Continue gabapentin 600 mg 3 times a day

## 2023-02-21 NOTE — Assessment & Plan Note (Signed)
 Chronic stable conditions Well-controlled diabetes with hemoglobin A1c of 6.7 Continue glipizide, Jardiance, and weekly Mounjaro Continue rosuvastatin 20 mg daily Diet and nutrition discussed

## 2023-02-21 NOTE — Progress Notes (Signed)
 Megan Lane 68 y.o.   Chief Complaint  Patient presents with   Arthritis    Patient states her right hand is in pain. She is having tingling at the tips of her fingers. Also c/o of a knot on left foot, pt also c/o feeling light headed here often and not sure why. she states its been there, started 2 years ago and didn't cause pain but now is. Pt states her meds was refilled wrong for the gabapentin  and glipizide .     HISTORY OF PRESENT ILLNESS: This is a 68 y.o. female here for follow-up of chronic medical conditions History of diabetes, hypertension, dyslipidemia, and osteoarthritis History of diabetic neuropathy on gabapentin  for many years Complaining of "arthritis is killing me". Lab Results  Component Value Date   HGBA1C 10.5 (A) 10/30/2022   Wt Readings from Last 3 Encounters:  02/21/23 208 lb (94.3 kg)  12/07/22 209 lb 8 oz (95 kg)  12/04/22 198 lb (89.8 kg)     HPI   Prior to Admission medications   Medication Sig Start Date End Date Taking? Authorizing Provider  aspirin  EC 81 MG tablet Take 1 tablet (81 mg total) by mouth daily. Swallow whole. 11/01/20  Yes Newlin, Enobong, MD  Blood Glucose Monitoring Suppl (CONTOUR NEXT MONITOR) w/Device KIT 1 each by Does not apply route 3 (three) times daily as needed. 09/16/21  Yes Newlin, Enobong, MD  cyclobenzaprine  (FLEXERIL ) 10 MG tablet Take 1 tablet (10 mg total) by mouth 3 (three) times daily as needed for muscle spasms. 10/30/22  Yes Shanielle Correll, Isidro Margo, MD  DULoxetine  (CYMBALTA ) 60 MG capsule Take 1 capsule (60 mg total) by mouth daily. 05/25/22  Yes Nathaneal Sommers, Isidro Margo, MD  empagliflozin  (JARDIANCE ) 25 MG TABS tablet Take 1 tablet (25 mg total) by mouth daily before breakfast. 10/30/22  Yes Antwuan Eckley, Isidro Margo, MD  gabapentin  (NEURONTIN ) 300 MG capsule Take 2 capsules (600 mg total) by mouth 3 (three) times daily. 01/12/23  Yes Dannica Bickham, Isidro Margo, MD  glipiZIDE  (GLUCOTROL ) 10 MG tablet Take 1 tablet (10 mg total) by  mouth 2 (two) times daily before a meal. 01/12/23  Yes Ahaana Rochette, Isidro Margo, MD  glucose blood (CONTOUR NEXT TEST) test strip Use as instructed 09/16/21  Yes Newlin, Enobong, MD  hydrochlorothiazide  (HYDRODIURIL ) 25 MG tablet Take 1 tablet (25 mg total) by mouth daily. 05/25/22  Yes Tyshell Ramberg, Isidro Margo, MD  Lancets (ACCU-CHEK SOFT TOUCH) lancets Use as instructed 02/12/17  Yes Hairston, Mandesia R, FNP  methocarbamol  (ROBAXIN -750) 750 MG tablet Take 1 tablet (750 mg total) by mouth 2 (two) times daily as needed for muscle spasms. 12/19/22  Yes Sandie Cross, PA-C  metoprolol  tartrate (LOPRESSOR ) 100 MG tablet Take 1 tablet (100 mg total) by mouth as directed. Take (1) tablet two hours before your CT scan 08/14/22  Yes Hugh Madura, MD  naproxen  (NAPROSYN ) 500 MG tablet Take 1 tablet (500 mg total) by mouth 2 (two) times daily. 12/04/22  Yes Lyna Sandhoff, PA-C  nystatin -triamcinolone  (MYCOLOG II) cream Apply 1 Application topically to corners of mouth 4 times daily. 01/17/23  Yes   rosuvastatin  (CRESTOR ) 20 MG tablet Take 1 tablet (20 mg total) by mouth daily. 10/30/22  Yes Miyana Mordecai, Isidro Margo, MD  tirzepatide  (MOUNJARO ) 10 MG/0.5ML Pen Inject 10 mg into the skin once a week. 10/30/22  Yes Elvira Hammersmith, MD  azithromycin  (ZITHROMAX ) 250 MG tablet Sig as indicated Patient not taking: Reported on 02/21/2023 08/21/22   Saraih Lorton Jose,  MD  benzonatate  (TESSALON ) 200 MG capsule Take 1 capsule (200 mg total) by mouth 2 (two) times daily as needed for cough. Patient not taking: Reported on 02/21/2023 08/21/22   Elvira Hammersmith, MD    Allergies  Allergen Reactions   Penicillins Rash    Did it involve swelling of the face/tongue/throat, SOB, or low BP? {[id number] Did it involve sudden or severe rash/hives, skin peeling, or any reaction on the inside of your mouth or nose? Y Did you need to seek medical attention at a hospital or doctor's office? Y When did it last happen?   Several years ago     If all above answers are "NO", may proceed with cephalosporin use.     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   KNEE SURGERY     LEG SURGERY     left lower extremity   TUBAL LIGATION     VENTRAL HERNIA REPAIR N/A 05/29/2019   Procedure: REPAIR OF VENTRAL HERNIA WITH MESH;  Surgeon: Sim Dryer, MD;  Location: [facility] OR;  Service: General;  Laterality: N/A;    Social History   Socioeconomic History   Marital status: Single    Spouse name: Not on file   Number of children: Not on file   Years of education: Not on file   Highest education level: Not on file  Occupational History   Occupation: Part time (04/13/20)  Tobacco Use   Smoking status: Former    Current packs/day: 0.00    Average packs/day: 3.0 packs/day for 35.0 years (105.0 ttl pk-yrs)    Types: Cigarettes    Start date: 08/12/1970    Quit date: 08/11/2005    Years since quitting: 17.5   Smokeless tobacco: Never   Tobacco comments:    Age 52 start  Vaping Use   Vaping status: Never Used  Substance and Sexual Activity   Alcohol use: Never   Drug use: Never   Sexual activity: Not Currently    Birth  control/protection: Post-menopausal, Surgical  Other Topics Concern   Not on file  Social History Narrative   Lives with son   Right Handed   Drinks rarely caffeine   Social Drivers of Corporate investment banker Strain: Not on file  Food Insecurity: Not on file  Transportation Needs: Not on file  Physical Activity: Not on file  Stress: Not on file  Social Connections: Not on file  Intimate Partner Violence: Not on file    Family History  Problem Relation Age of Onset   Diabetes Mother    Hypertension Mother    Stroke Mother  Breast cancer Sister 23   Heart disease Brother    Colon cancer Neg Hx    Colon polyps Neg Hx    Esophageal cancer Neg Hx    Rectal cancer Neg Hx    Stomach cancer Neg Hx      Review of Systems  Constitutional: Negative.  Negative for chills and fever.  HENT: Negative.  Negative for congestion and sore throat.   Respiratory: Negative.  Negative for cough and shortness of breath.   Cardiovascular: Negative.  Negative for chest pain and palpitations.  Gastrointestinal:  Negative for abdominal pain, diarrhea, nausea and vomiting.  Genitourinary: Negative.  Negative for dysuria and hematuria.  Musculoskeletal:  Positive for joint pain.  Skin: Negative.  Negative for rash.  Neurological: Negative.  Negative for dizziness and headaches.  All other systems reviewed and are negative.  Vitals:   02/21/23 1455  BP: 110/70  Pulse: 84  Temp: 98.6 F (37 C)  SpO2: 94%    Physical Exam Vitals reviewed.  Constitutional:      Appearance: Normal appearance.  HENT:     Head: Normocephalic.     Mouth/Throat:     Mouth: Mucous membranes are moist.     Pharynx: Oropharynx is clear.  Eyes:     Extraocular Movements: Extraocular movements intact.     Conjunctiva/sclera: Conjunctivae normal.     Pupils: Pupils are equal, round, and reactive to light.  Cardiovascular:     Rate and Rhythm: Normal rate and regular rhythm.     Pulses: Normal pulses.      Heart sounds: Normal heart sounds.  Pulmonary:     Effort: Pulmonary effort is normal.     Breath sounds: Normal breath sounds.  Musculoskeletal:        General: No swelling, tenderness or deformity.     Cervical back: No tenderness.  Lymphadenopathy:     Cervical: No cervical adenopathy.  Skin:    General: Skin is warm and dry.     Capillary Refill: Capillary refill takes less than 2 seconds.  Neurological:     General: No focal deficit present.     Mental Status: She is alert and oriented to person, place, and time.  Psychiatric:        Mood and Affect: Mood normal.        Behavior: Behavior normal.   Results for orders placed or performed in visit on 02/21/23 (from the past 24 hours)  POCT HgB A1C     Status: Abnormal   Collection Time: 02/21/23  4:40 PM  Result Value Ref Range   Hemoglobin A1C 6.7 (A) 4.0 - 5.6 %   HbA1c POC (<> result, manual entry)     HbA1c, POC (prediabetic range)     HbA1c, POC (controlled diabetic range)      ASSESSMENT & PLAN: A total of 43 minutes was spent with the patient and counseling/coordination of care regarding preparing for this visit, review of most recent office visit notes, review of multiple chronic medical conditions and their management, cardiovascular risks associated with hypertension and diabetes, review of all medications, review of most recent bloodwork results including today's interpretation of hemoglobin A1c, review of health maintenance items, education on nutrition, prognosis, documentation, and need for follow up.   Problem List Items Addressed This Visit       Cardiovascular and Mediastinum   Hypertension associated with diabetes (HCC) - Primary   Well-controlled hypertension Continue hydrochlorothiazide  25 mg daily Well-controlled diabetes with hemoglobin A1c of  6.7 Continue Jardiance  25 mg daily, glipizide  10 mg twice a day and weekly Mounjaro  10 mg Cardiovascular risks associated with diabetes and hypertension  discussed Diet and nutrition discussed Follow-up in 6 months      Relevant Medications   glipiZIDE  (GLUCOTROL ) 10 MG tablet     Endocrine   Dyslipidemia associated with type 2 diabetes mellitus (HCC)   Chronic stable conditions Well-controlled diabetes with hemoglobin A1c of 6.7 Continue glipizide , Jardiance , and weekly Mounjaro  Continue rosuvastatin  20 mg daily Diet and nutrition discussed       Relevant Medications   glipiZIDE  (GLUCOTROL ) 10 MG tablet   Diabetic polyneuropathy associated with type 2 diabetes mellitus (HCC)   Stable. Continue gabapentin  600 mg 3 times a day       Relevant Medications   gabapentin  (NEURONTIN ) 300 MG capsule   glipiZIDE  (GLUCOTROL ) 10 MG tablet     Other   History of stroke   Secondary stroke prevention measures discussed Importance of diabetes and hypertension control addressed Need to take statin addressed Continues daily baby aspirin       Other Visit Diagnoses       Type 2 diabetes mellitus with diabetic neuropathy, without long-term current use of insulin  (HCC)       Relevant Medications   glipiZIDE  (GLUCOTROL ) 10 MG tablet      Patient Instructions  Diabetes Mellitus and Nutrition, Adult When you have diabetes, or diabetes mellitus, it is very important to have healthy eating habits because your blood sugar (glucose) levels are greatly affected by what you eat and drink. Eating healthy foods in the right amounts, at about the same times every day, can help you: Manage your blood glucose. Lower your risk of heart disease. Improve your blood pressure. Reach or maintain a healthy weight. What can affect my meal plan? Every person with diabetes is different, and each person has different needs for a meal plan. Your health care provider may recommend that you work with a dietitian to make a meal plan that is best for you. Your meal plan may vary depending on factors such as: The calories you need. The medicines you take. Your  weight. Your blood glucose, blood pressure, and cholesterol levels. Your activity level. Other health conditions you have, such as heart or kidney disease. How do carbohydrates affect me? Carbohydrates, also called carbs, affect your blood glucose level more than any other type of food. Eating carbs raises the amount of glucose in your blood. It is important to know how many carbs you can safely have in each meal. This is different for every person. Your dietitian can help you calculate how many carbs you should have at each meal and for each snack. How does alcohol affect me? Alcohol can cause a decrease in blood glucose (hypoglycemia), especially if you use insulin  or take certain diabetes medicines by mouth. Hypoglycemia can be a life-threatening condition. Symptoms of hypoglycemia, such as sleepiness, dizziness, and confusion, are similar to symptoms of having too much alcohol. Do not drink alcohol if: Your health care provider tells you not to drink. You are pregnant, may be pregnant, or are planning to become pregnant. If you drink alcohol: Limit how much you have to: 0-1 drink a day for women. 0-2 drinks a day for men. Know how much alcohol is in your drink. In the [place] one drink equals one 12 oz bottle of beer (355 mL), one 5 oz glass of wine (148 mL), or one 1 oz glass of hard liquor (  44 mL). Keep yourself hydrated with water, diet soda, or unsweetened iced tea. Keep in mind that regular soda, juice, and other mixers may contain a lot of sugar and must be counted as carbs. What are tips for following this plan?  Reading food labels Start by checking the serving size on the Nutrition Facts label of packaged foods and drinks. The number of calories and the amount of carbs, fats, and other nutrients listed on the label are based on one serving of the item. Many items contain more than one serving per package. Check the total grams (g) of carbs in one serving. Check the number of grams  of saturated fats and trans fats in one serving. Choose foods that have a low amount or none of these fats. Check the number of milligrams (mg) of salt (sodium) in one serving. Most people should limit total sodium intake to less than 2,300 mg per day. Always check the nutrition information of foods labeled as "low-fat" or "nonfat." These foods may be higher in added sugar or refined carbs and should be avoided. Talk to your dietitian to identify your daily goals for nutrients listed on the label. Shopping Avoid buying canned, pre-made, or processed foods. These foods tend to be high in fat, sodium, and added sugar. Shop around the outside edge of the grocery store. This is where you will most often find fresh fruits and vegetables, bulk grains, fresh meats, and fresh dairy products. Cooking Use low-heat cooking methods, such as baking, instead of high-heat cooking methods, such as deep frying. Cook using healthy oils, such as olive, canola, or sunflower oil. Avoid cooking with butter, cream, or high-fat meats. Meal planning Eat meals and snacks regularly, preferably at the same times every day. Avoid going long periods of time without eating. Eat foods that are high in fiber, such as fresh fruits, vegetables, beans, and whole grains. Eat 4-6 oz (112-168 g) of lean protein each day, such as lean meat, chicken, fish, eggs, or tofu. One ounce (oz) (28 g) of lean protein is equal to: 1 oz (28 g) of meat, chicken, or fish. 1 egg.  cup (62 g) of tofu. Eat some foods each day that contain healthy fats, such as avocado, nuts, seeds, and fish. What foods should I eat? Fruits Berries. Apples. Oranges. Peaches. Apricots. Plums. Grapes. Mangoes. Papayas. Pomegranates. Kiwi. Cherries. Vegetables Leafy greens, including lettuce, spinach, kale, chard, collard greens, mustard greens, and cabbage. Beets. Cauliflower. Broccoli. Carrots. Green beans. Tomatoes. Peppers. Onions. Cucumbers. Brussels  sprouts. Grains Whole grains, such as whole-wheat or whole-grain bread, crackers, tortillas, cereal, and pasta. Unsweetened oatmeal. Quinoa. Brown or wild rice. Meats and other proteins Seafood. Poultry without skin. Lean cuts of poultry and beef. Tofu. Nuts. Seeds. Dairy Low-fat or fat-free dairy products such as milk, yogurt, and cheese. The items listed above may not be a complete list of foods and beverages you can eat and drink. Contact a dietitian for more information. What foods should I avoid? Fruits Fruits canned with syrup. Vegetables Canned vegetables. Frozen vegetables with butter or cream sauce. Grains Refined white flour and flour products such as bread, pasta, snack foods, and cereals. Avoid all processed foods. Meats and other proteins Fatty cuts of meat. Poultry with skin. Breaded or fried meats. Processed meat. Avoid saturated fats. Dairy Full-fat yogurt, cheese, or milk. Beverages Sweetened drinks, such as soda or iced tea. The items listed above may not be a complete list of foods and beverages you should avoid. Contact a  dietitian for more information. Questions to ask a health care provider Do I need to meet with a certified diabetes care and education specialist? Do I need to meet with a dietitian? What number can I call if I have questions? When are the best times to check my blood glucose? Where to find more information: American Diabetes Association: diabetes.org Academy of Nutrition and Dietetics: eatright.[employer] of Diabetes and Digestive and Kidney Diseases: StageSync.si Association of Diabetes Care & Education Specialists: diabeteseducator.org Summary It is important to have healthy eating habits because your blood sugar (glucose) levels are greatly affected by what you eat and drink. It is important to use alcohol carefully. A healthy meal plan will help you manage your blood glucose and lower your risk of heart disease. Your health  care provider may recommend that you work with a dietitian to make a meal plan that is best for you. This information is not intended to replace advice given to you by your health care provider. Make sure you discuss any questions you have with your health care provider. Document Revised: 08/27/2019 Document Reviewed: 08/27/2019 Elsevier Patient Education  2024 Elsevier Inc.     Maryagnes Small, MD [facility]

## 2023-02-21 NOTE — Patient Instructions (Signed)

## 2023-02-22 ENCOUNTER — Other Ambulatory Visit (HOSPITAL_COMMUNITY): Payer: Self-pay

## 2023-02-26 ENCOUNTER — Telehealth: Payer: Self-pay

## 2023-02-26 NOTE — Progress Notes (Signed)
Care Guide Pharmacy Note  02/26/2023 Name: Megan Lane MRN: [id number] DOB: 06-02-1955  Referred By: Georgina Quint, MD Reason for referral: Care Coordination ([facility] Diabetes. )   Megan Lane is a 68 y.o. year old female who is a primary care patient of Alvy Bimler, Eilleen Kempf, MD.  Megan Lane was referred to the pharmacist for assistance related to: DMII  A second unsuccessful telephone outreach was attempted today to contact the patient who was referred to the pharmacy team for assistance with diabetes. Additional attempts will be made to contact the patient.  [facility]  [facility], [facility] Health Care Management Assistant Direct Dial: [phone]  Fax: [phone]

## 2023-03-01 ENCOUNTER — Telehealth: Payer: Self-pay

## 2023-03-01 DIAGNOSIS — H2513 Age-related nuclear cataract, bilateral: Secondary | ICD-10-CM | POA: Diagnosis not present

## 2023-03-01 DIAGNOSIS — Z135 Encounter for screening for eye and ear disorders: Secondary | ICD-10-CM | POA: Diagnosis not present

## 2023-03-01 DIAGNOSIS — H52223 Regular astigmatism, bilateral: Secondary | ICD-10-CM | POA: Diagnosis not present

## 2023-03-01 DIAGNOSIS — H524 Presbyopia: Secondary | ICD-10-CM | POA: Diagnosis not present

## 2023-03-01 DIAGNOSIS — H5213 Myopia, bilateral: Secondary | ICD-10-CM | POA: Diagnosis not present

## 2023-03-01 LAB — HM DIABETES EYE EXAM

## 2023-03-01 NOTE — Progress Notes (Signed)
Care Guide Pharmacy Note  03/01/2023 Name: Megan Lane MRN: [id number] DOB: November 26, 1955  Referred By: Georgina Quint, MD Reason for referral: Care Coordination ([facility] Diabetes. )   Megan Lane is a 68 y.o. year old female who is a primary care patient of Georgina Quint, MD.  Megan Lane was referred to the pharmacist for assistance related to: DMII  Successful contact was made with the patient to discuss pharmacy services including being ready for the pharmacist to call at least 5 minutes before the scheduled appointment time and to have medication bottles and any blood pressure readings ready for review. The patient agreed to meet with the pharmacist via telephone visit on (date/time). 03/08/23 at 2:00 p.m.   [facility]  [facility], [facility] Health Care Management Assistant Direct Dial: [phone]  Fax: [phone]

## 2023-03-08 ENCOUNTER — Other Ambulatory Visit: Payer: BC Managed Care – PPO

## 2023-03-14 ENCOUNTER — Other Ambulatory Visit: Payer: BC Managed Care – PPO

## 2023-03-15 ENCOUNTER — Other Ambulatory Visit: Payer: BC Managed Care – PPO

## 2023-03-23 ENCOUNTER — Other Ambulatory Visit (HOSPITAL_COMMUNITY): Payer: Self-pay

## 2023-04-06 ENCOUNTER — Ambulatory Visit: Payer: Self-pay | Admitting: Emergency Medicine

## 2023-04-06 NOTE — Telephone Encounter (Signed)
 Chief Complaint: Right hand pain Symptoms: swelling to the knuckle on the forefinger, numbness/tingling to tip of fingers on right hand, pain 8/10 Frequency: Onset 2 days ago Pertinent Negatives: Patient denies other symptoms, no redness Disposition: [] ED /[] Urgent Care (no appt availability in office) / [x] Appointment(In office/virtual)/ []  [facility]/ [] Home Care/ [] Refused Recommended Disposition /[] [facility]/ []  Follow-up with PCP Additional Notes: Patient reports having problems with her hand while working before her retirement in January, 2025. She say since then the pain has gotten worse, hard to squeeze her hand shut, hard to grip, painful to touch. She says she's been taking BC Arthritis every 4 hours with no relief of the pain. Advised OV with any provider, she only wants her PCP. Scheduled first available.    Copied From CRM [id number]. Reason for Triage: Patient is having some pain in her hands due to arthritis and would like to know if the provider could call her something into the pharmacy. Patient's call back # is [phone]   Reason for Disposition  [1] MODERATE pain (e.g., interferes with normal activities) AND [2] present > 3 days  Answer Assessment - Initial Assessment Questions 1. ONSET: "When did the pain start?"     2 days ago 2. LOCATION: "Where is the pain located?"     Right hand 3. PAIN: "How bad is the pain?" (Scale 1-10; or mild, moderate, severe)   - MILD (1-3): doesn't interfere with normal activities   - MODERATE (4-7): interferes with normal activities (e.g., work or school) or awakens from sleep   - SEVERE (8-10): excruciating pain, unable to use hand at all     8 4. WORK OR EXERCISE: "Has there been any recent work or exercise that involved this part (i.e., hand or wrist) of the body?"     Work that was done before retirement in January 2025 caused the pain, but it's worse now 5. CAUSE: "What do you think is causing the pain?"      Rheumatoid arthritis 6. AGGRAVATING FACTORS: "What makes the pain worse?" (e.g., using computer)     Any movement, gripping, opening/closing hand, stretching fingers 7. OTHER SYMPTOMS: "Do you have any other symptoms?" (e.g., neck pain, swelling, rash, numbness, fever)     Swelling to the knuckle of the forefinger, tingling/numbness at the tip of all fingers on right hand  Protocols used: Hand and Wrist Pain-A-AH

## 2023-04-11 ENCOUNTER — Other Ambulatory Visit (HOSPITAL_COMMUNITY): Payer: Self-pay

## 2023-04-11 ENCOUNTER — Ambulatory Visit: Payer: BC Managed Care – PPO | Admitting: Emergency Medicine

## 2023-04-11 ENCOUNTER — Encounter: Payer: Self-pay | Admitting: Emergency Medicine

## 2023-04-11 VITALS — BP 110/68 | HR 84 | Temp 98.7°F | Ht 73.0 in | Wt 200.0 lb

## 2023-04-11 DIAGNOSIS — I152 Hypertension secondary to endocrine disorders: Secondary | ICD-10-CM

## 2023-04-11 DIAGNOSIS — M79641 Pain in right hand: Secondary | ICD-10-CM | POA: Diagnosis not present

## 2023-04-11 DIAGNOSIS — E1159 Type 2 diabetes mellitus with other circulatory complications: Secondary | ICD-10-CM | POA: Diagnosis not present

## 2023-04-11 DIAGNOSIS — E1169 Type 2 diabetes mellitus with other specified complication: Secondary | ICD-10-CM

## 2023-04-11 DIAGNOSIS — M150 Primary generalized (osteo)arthritis: Secondary | ICD-10-CM

## 2023-04-11 DIAGNOSIS — Z7984 Long term (current) use of oral hypoglycemic drugs: Secondary | ICD-10-CM

## 2023-04-11 DIAGNOSIS — E1142 Type 2 diabetes mellitus with diabetic polyneuropathy: Secondary | ICD-10-CM

## 2023-04-11 DIAGNOSIS — E785 Hyperlipidemia, unspecified: Secondary | ICD-10-CM

## 2023-04-11 DIAGNOSIS — Z7985 Long-term (current) use of injectable non-insulin antidiabetic drugs: Secondary | ICD-10-CM

## 2023-04-11 MED ORDER — MELOXICAM 15 MG PO TABS
15.0000 mg | ORAL_TABLET | Freq: Every day | ORAL | 1 refills | Status: DC
  Filled 2023-04-11: qty 30, 30d supply, fill #0
  Filled 2023-04-25 – 2023-05-20 (×2): qty 30, 30d supply, fill #1

## 2023-04-11 NOTE — Patient Instructions (Signed)

## 2023-04-11 NOTE — Assessment & Plan Note (Signed)
 Pain management discussed.  Over-the-counter NSAIDs not working Recommend Tylenol and to start meloxicam 15 mg daily Rheumatology referral placed today.

## 2023-04-11 NOTE — Assessment & Plan Note (Signed)
Stable.  Continue gabapentin 600 mg 3 times a day

## 2023-04-11 NOTE — Assessment & Plan Note (Signed)
 Well-controlled hypertension Continue hydrochlorothiazide 25 mg daily Well-controlled diabetes with hemoglobin A1c of 6.7 Continue Jardiance 25 mg daily, glipizide 10 mg twice a day and weekly Mounjaro 10 mg Cardiovascular risks associated with diabetes and hypertension discussed Diet and nutrition discussed Follow-up in 6 months

## 2023-04-11 NOTE — Assessment & Plan Note (Signed)
 No recent if ever evaluation by rheumatologist Active and affecting quality of life Recommend daily meloxicam 50 mg May benefit from DMRDs. Rheumatology referral placed today

## 2023-04-11 NOTE — Assessment & Plan Note (Signed)
 Chronic stable conditions Well-controlled diabetes with hemoglobin A1c of 6.7 Continue glipizide, Jardiance, and weekly Mounjaro Continue rosuvastatin 20 mg daily Diet and nutrition discussed

## 2023-04-11 NOTE — Progress Notes (Signed)
 Megan Lane 68 y.o.   Chief Complaint  Patient presents with   Hand Pain    Patient has rheumatoid arthritis Right hand is swelling and in pain. Patient c/o of her right throbbing like it wants to hurt. Started 2 days ago.      HISTORY OF PRESENT ILLNESS: This is a 69 y.o. female complaining of pain to both hands, right more than left.  Has history of arthritis.  Denies recent injury. No other complaint or medical concerns today.  Hand Pain  Pertinent negatives include no chest pain.     Prior to Admission medications   Medication Sig Start Date End Date Taking? Authorizing Provider  aspirin EC 81 MG tablet Take 1 tablet (81 mg total) by mouth daily. Swallow whole. 11/01/20  Yes Hoy Register, MD  Blood Glucose Monitoring Suppl (CONTOUR NEXT MONITOR) w/Device KIT 1 each by Does not apply route 3 (three) times daily as needed. 09/16/21  Yes Hoy Register, MD  cyclobenzaprine (FLEXERIL) 10 MG tablet Take 1 tablet (10 mg total) by mouth 3 (three) times daily as needed for muscle spasms. 10/30/22  Yes Claus Silvestro, Eilleen Kempf, MD  DULoxetine (CYMBALTA) 60 MG capsule Take 1 capsule (60 mg total) by mouth daily. 05/25/22  Yes Shea Swalley, Eilleen Kempf, MD  empagliflozin (JARDIANCE) 25 MG TABS tablet Take 1 tablet (25 mg total) by mouth daily before breakfast. 10/30/22  Yes Lisa Milian, Eilleen Kempf, MD  gabapentin (NEURONTIN) 300 MG capsule Take 2 capsules (600 mg total) by mouth 3 (three) times daily. 02/21/23 02/16/24 Yes Makaylee Spielberg, Eilleen Kempf, MD  glipiZIDE (GLUCOTROL) 10 MG tablet Take 1 tablet (10 mg total) by mouth 2 (two) times daily before a meal. 02/21/23  Yes Brogan England, Eilleen Kempf, MD  glucose blood (CONTOUR NEXT TEST) test strip Use as instructed 09/16/21  Yes Newlin, Odette Horns, MD  hydrochlorothiazide (HYDRODIURIL) 25 MG tablet Take 1 tablet (25 mg total) by mouth daily. 05/25/22  Yes Jaiya Mooradian, Eilleen Kempf, MD  Lancets (ACCU-CHEK SOFT TOUCH) lancets Use as instructed 02/12/17  Yes Hairston,  Oren Beckmann, FNP  methocarbamol (ROBAXIN-750) 750 MG tablet Take 1 tablet (750 mg total) by mouth 2 (two) times daily as needed for muscle spasms. 12/19/22  Yes Cristie Hem, PA-C  metoprolol tartrate (LOPRESSOR) 100 MG tablet Take 1 tablet (100 mg total) by mouth as directed. Take (1) tablet two hours before your CT scan 08/14/22  Yes Jake Bathe, MD  naproxen (NAPROSYN) 500 MG tablet Take 1 tablet (500 mg total) by mouth 2 (two) times daily. 12/04/22  Yes Renne Crigler, PA-C  nystatin-triamcinolone (MYCOLOG II) cream Apply 1 Application topically to corners of mouth 4 times daily. 01/17/23  Yes   rosuvastatin (CRESTOR) 20 MG tablet Take 1 tablet (20 mg total) by mouth daily. 10/30/22  Yes Jarrick Fjeld, Eilleen Kempf, MD  tirzepatide [facility]) 10 MG/0.5ML Pen Inject 10 mg into the skin once a week. 10/30/22  Yes Georgina Quint, MD  azithromycin [facility]) 250 MG tablet Sig as indicated Patient not taking: Reported on 12/07/2022 08/21/22   Georgina Quint, MD  benzonatate (TESSALON) 200 MG capsule Take 1 capsule (200 mg total) by mouth 2 (two) times daily as needed for cough. Patient not taking: Reported on 04/11/2023 08/21/22   Georgina Quint, MD    Allergies  Allergen Reactions   Penicillins Rash    Did it involve swelling of the face/tongue/throat, SOB, or low BP? {Y/N/UNK:3040802 Did it involve sudden or severe rash/hives, skin peeling, or any reaction  on the inside of your mouth or nose? Y Did you need to seek medical attention at a hospital or doctor's office? Y When did it last happen?  Several years ago     If all above answers are "NO", may proceed with cephalosporin use.     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   HLD (hyperlipidemia) 07/08/2016   Dyslipidemia associated with type 2 diabetes mellitus (HCC) 10/16/2013   Essential hypertension, benign 04/30/2013    Past Medical History:  Diagnosis Date   Asthma    as a child   Diabetes mellitus    Hypercholesterolemia    Hypertension    Stroke (HCC) 04/2021   TIA   Ventral hernia    Wears glasses     Past Surgical History:  Procedure Laterality Date   ABDOMINAL HYSTERECTOMY     COLONOSCOPY  2012   normal , at [facility]   KNEE SURGERY     LEG SURGERY     left lower extremity   TUBAL LIGATION     VENTRAL HERNIA REPAIR N/A 05/29/2019   Procedure: REPAIR OF VENTRAL HERNIA WITH MESH;  Surgeon: Harriette Bouillon, MD;  Location: [facility] OR;  Service: General;  Laterality: N/A;    Social History   Socioeconomic History   Marital status: Single    Spouse name: Not on file   Number of children: Not on file   Years of education: Not on file   Highest education level: Not on file  Occupational History   Occupation: Part time (04/13/20)  Tobacco Use   Smoking status: Former    Current packs/day: 0.00    Average packs/day: 3.0 packs/day for 35.0 years (105.0 ttl pk-yrs)    Types: Cigarettes    Start date: 08/12/1970    Quit date: 08/11/2005    Years since quitting: 17.6   Smokeless tobacco: Never   Tobacco comments:    Age 24 start  Vaping Use   Vaping status: Never Used  Substance and Sexual Activity   Alcohol use: Never   Drug use: Never   Sexual activity: Not Currently    Birth control/protection: Post-menopausal, Surgical  Other Topics Concern   Not on file  Social History Narrative   Lives with son   Right Handed   Drinks rarely caffeine   Social Drivers of Corporate investment banker Strain: Not on file  Food Insecurity: Not on file  Transportation Needs: Not on file  Physical Activity: Not on file  Stress: Not on file  Social Connections: Not on  file  Intimate Partner Violence: Not on file    Family History  Problem Relation Age of Onset   Diabetes Mother    Hypertension Mother    Stroke Mother    Breast cancer Sister 31   Heart disease Brother    Colon cancer Neg Hx    Colon polyps Neg Hx    Esophageal cancer Neg Hx    Rectal cancer Neg Hx    Stomach cancer Neg Hx      Review of Systems  Constitutional: Negative.   HENT: Negative.  Negative for congestion and sore throat.   Respiratory: Negative.  Negative for cough and shortness of breath.   Cardiovascular: Negative.  Negative for chest pain and palpitations.  Gastrointestinal:  Negative for abdominal pain, diarrhea, nausea and vomiting.  Genitourinary: Negative.  Negative for dysuria and urgency.  Musculoskeletal:  Positive for joint pain.  Skin: Negative.  Negative for rash.  Neurological: Negative.  Negative for dizziness and headaches.  All other systems reviewed and are negative.   Vitals:   04/11/23 1043  BP: 110/68  Pulse: 84  Temp: 98.7 F (37.1 C)  SpO2: 95%    Physical Exam Vitals reviewed.  Constitutional:      Appearance: Normal appearance.  HENT:     Head: Normocephalic.     Right Ear: Tympanic membrane, ear canal and external ear normal.     Mouth/Throat:     Mouth: Mucous membranes are moist.     Pharynx: Oropharynx is clear.  Eyes:     Extraocular Movements: Extraocular movements intact.  Cardiovascular:     Rate and Rhythm: Normal rate and regular rhythm.     Pulses: Normal pulses.     Heart sounds: Normal heart sounds.  Pulmonary:     Effort: Pulmonary effort is normal.     Breath sounds: Normal breath sounds.  Musculoskeletal:        General: Tenderness and deformity present.     Comments: Bouchard and Heberden's nodes both hands  Skin:    General: Skin is warm and dry.     Capillary Refill: Capillary refill takes less than 2 seconds.  Neurological:     General: No focal deficit present.     Mental Status: She is alert  and oriented to person, place, and time.  Psychiatric:        Mood and Affect: Mood normal.        Behavior: Behavior normal.      ASSESSMENT & PLAN: A total of 42 minutes was spent with the patient and counseling/coordination of care regarding preparing for this visit, review of most recent office visit notes, review of multiple chronic medical conditions and their management, pain management, review of all medications and changes made, review of most recent bloodwork results, review of health maintenance items, education on nutrition, prognosis, documentation, and need for follow up.   Problem List Items Addressed This Visit       Cardiovascular and Mediastinum   Hypertension associated with diabetes (HCC)   Well-controlled hypertension Continue hydrochlorothiazide 25 mg daily Well-controlled diabetes with hemoglobin A1c of 6.7 Continue Jardiance 25 mg daily, glipizide 10 mg twice a day and weekly Mounjaro 10 mg Cardiovascular risks associated with diabetes and hypertension discussed Diet and nutrition discussed Follow-up in 6 months        Endocrine   Dyslipidemia associated with type 2 diabetes mellitus (HCC)   Chronic stable conditions Well-controlled diabetes with hemoglobin A1c of 6.7 Continue glipizide, Jardiance, and weekly Mounjaro Continue rosuvastatin 20 mg daily Diet and nutrition discussed      Diabetic polyneuropathy associated with type 2 diabetes mellitus (HCC)   Stable. Continue gabapentin 600 mg 3 times a day         Musculoskeletal and Integument   Primary osteoarthritis involving multiple joints - Primary   No recent if ever evaluation by rheumatologist Active and affecting quality of life Recommend daily meloxicam 50 mg May benefit from DMRDs. Rheumatology referral placed today      Relevant Medications   meloxicam (MOBIC) 15 MG tablet   Other Relevant Orders   Ambulatory referral  to Rheumatology     Other   Right hand pain   Pain  management discussed.  Over-the-counter NSAIDs not working Recommend Tylenol and to start meloxicam 15 mg daily Rheumatology referral placed today.      Relevant Medications   meloxicam (MOBIC) 15 MG tablet   Patient Instructions  Osteoarthritis  Osteoarthritis is a type of arthritis. It refers to joint pain or joint disease. Osteoarthritis affects tissue that covers the ends of bones in joints (cartilage). Cartilage acts as a cushion between the bones and helps them move smoothly. Osteoarthritis occurs when cartilage in the joints gets worn down. Osteoarthritis is sometimes called "wear and tear" arthritis. Osteoarthritis is the most common form of arthritis. It often occurs in older people. It is a condition that gets worse over time. The joints most often affected by this condition are in the fingers, toes, hips, knees, and spine, including the neck and lower back. What are the causes? This condition is caused by the wearing down of cartilage that covers the ends of bones. What increases the risk? The following factors may make you more likely to develop this condition: Being age 15 or older. Obesity. Overuse of joints. Past injury of a joint. Past surgery on a joint. Family history of osteoarthritis. What are the signs or symptoms? The main symptoms of this condition are pain, swelling, and stiffness in the joint. Other symptoms may include: An enlarged joint. More pain and further damage caused by small pieces of bone or cartilage that break off and float inside of the joint. Small deposits of bone (osteophytes) that grow on the edges of the joint. A grating or scraping feeling inside the joint when you move it. Popping or creaking sounds when you move. Difficulty walking or exercising. An inability to grip items, twist your hand, or control the movements of your hands and fingers. How is this diagnosed? This condition may be diagnosed based on: Your medical history. A  physical exam. Your symptoms. X-rays of the affected joints. Blood tests to rule out other types of arthritis. How is this treated? There is no cure for this condition, but treatment can help control pain and improve joint function. Treatment may include a combination of therapies, such as: Pain relief techniques, such as: Applying heat and cold to the joint. Massage. A form of talk therapy called cognitive behavioral therapy (CBT). This therapy helps you set goals and follow up on the changes that you make. Medicines for pain and inflammation. The medicines can be taken by mouth or applied to the skin. They include: NSAIDs, such as ibuprofen. Prescription medicines. Strong anti-inflammatory medicines (corticosteroids). Certain nutritional supplements. A prescribed exercise program. You may work with a physical therapist. Assistive devices, such as a brace, wrap, splint, specialized glove, or cane. A weight control plan. Surgery, such as: An osteotomy. This is done to reposition the bones and relieve pain or to remove loose pieces of bone and cartilage. Joint replacement surgery. You may need this surgery if you have advanced osteoarthritis. Follow these instructions at home: Activity Rest your affected joints as told by your health care provider. Exercise as told by your provider. The provider may recommend specific types of exercise, such as: Strengthening exercises. These are done to strengthen the muscles that support joints affected by arthritis. Aerobic activities. These are exercises, such as brisk walking or water aerobics, that increase your heart rate. Range-of-motion activities. These help your joints move more easily. Balance and agility exercises. Managing pain, stiffness,  and swelling     If told, apply heat to the affected area as often as told by your provider. Use the heat source that your provider recommends, such as a moist heat pack or a heating pad. If you have  a removable assistive device, remove it as told by your provider. Place a towel between your skin and the heat source. If your provider tells you to keep the assistive device on while you apply heat, place a towel between the assistive device and the heat source. Leave the heat on for 20-30 minutes. If told, put ice on the affected area. If you have a removable assistive device, remove it as told by your provider. Put ice in a plastic bag. Place a towel between your skin and the bag. If your provider tells you to keep the assistive device on during icing, place a towel between the assistive device and the bag. Leave the ice on for 20 minutes, 2-3 times a day. If your skin turns bright red, remove the ice or heat right away to prevent skin damage. The risk of damage is higher if you cannot feel pain, heat, or cold. Move your fingers or toes often to reduce stiffness and swelling. Raise (elevate) the affected area above the level of your heart while you are sitting or lying down. General instructions Take over-the-counter and prescription medicines only as told by your provider. Maintain a healthy weight. Follow instructions from your provider for weight control. Do not use any products that contain nicotine or tobacco. These products include cigarettes, chewing tobacco, and vaping devices, such as e-cigarettes. If you need help quitting, ask your provider. Use assistive devices as told by your provider. Where to find more information [employer] of Arthritis and Musculoskeletal and Skin Diseases: niams.http://www.myers.net/ [employer] on Aging: BaseRingTones.pl American College of Rheumatology: rheumatology.org Contact a health care provider if: You have redness, swelling, or a feeling of warmth in a joint that gets worse. You have a fever along with joint or muscle aches. You develop a rash. You have trouble doing your normal activities. You have pain that gets worse and is not relieved by pain  medicine. This information is not intended to replace advice given to you by your health care provider. Make sure you discuss any questions you have with your health care provider. Document Revised: 09/22/2021 Document Reviewed: 09/22/2021 Elsevier Patient Education  2024 Elsevier Inc.       [name], MD  Primary Care at [facility]

## 2023-04-13 ENCOUNTER — Other Ambulatory Visit (HOSPITAL_COMMUNITY): Payer: Self-pay

## 2023-04-26 ENCOUNTER — Other Ambulatory Visit (HOSPITAL_COMMUNITY): Payer: Self-pay

## 2023-05-10 ENCOUNTER — Other Ambulatory Visit (HOSPITAL_COMMUNITY): Payer: Self-pay

## 2023-05-17 ENCOUNTER — Other Ambulatory Visit (HOSPITAL_COMMUNITY): Payer: Self-pay

## 2023-05-17 ENCOUNTER — Encounter (HOSPITAL_COMMUNITY): Payer: Self-pay

## 2023-05-17 ENCOUNTER — Ambulatory Visit (INDEPENDENT_AMBULATORY_CARE_PROVIDER_SITE_OTHER)

## 2023-05-17 ENCOUNTER — Other Ambulatory Visit: Payer: Self-pay

## 2023-05-17 ENCOUNTER — Ambulatory Visit (HOSPITAL_COMMUNITY)
Admission: RE | Admit: 2023-05-17 | Discharge: 2023-05-17 | Disposition: A | Discharge status: Home / Self Care | Source: Ambulatory Visit | Attending: Family Medicine | Admitting: Family Medicine

## 2023-05-17 VITALS — BP 148/73 | HR 82 | Temp 98.2°F | Resp 18

## 2023-05-17 DIAGNOSIS — M25512 Pain in left shoulder: Secondary | ICD-10-CM

## 2023-05-17 DIAGNOSIS — I700 Atherosclerosis of aorta: Secondary | ICD-10-CM | POA: Diagnosis not present

## 2023-05-17 DIAGNOSIS — M19012 Primary osteoarthritis, left shoulder: Secondary | ICD-10-CM | POA: Diagnosis not present

## 2023-05-17 MED ORDER — HYDROCODONE-ACETAMINOPHEN 5-325 MG PO TABS
1.0000 | ORAL_TABLET | Freq: Four times a day (QID) | ORAL | 0 refills | Status: DC | PRN
  Filled 2023-05-17: qty 12, 3d supply, fill #0

## 2023-05-17 NOTE — Discharge Instructions (Signed)
 Be aware, you have been prescribed pain medications that may cause drowsiness. While taking this medication, do not take any other medications containing acetaminophen (Tylenol). Do not combine with alcohol or recreational drugs. Please do not drive, operate heavy machinery, or take part in activities that require making important decisions while on this medication as your judgement may be clouded.

## 2023-05-17 NOTE — ED Triage Notes (Signed)
 Increase left shoulder pain since a fall at church on Sunday.

## 2023-05-17 NOTE — ED Provider Notes (Signed)
 Megan Lane   [id number] 05/17/23 Arrival Time: 1600  ASSESSMENT & PLAN:  1. Acute pain of left shoulder    I have personally viewed and independently interpreted the imaging studies ordered this visit. LEFT shoulder: no acute bony abnormalities noted.  Takes Mobic daily; continue. New Prescriptions   HYDROCODONE-ACETAMINOPHEN (NORCO/VICODIN) 5-325 MG TABLET    Take 1 tablet by mouth every 6 (six) hours as needed for moderate pain (pain score 4-6) or severe pain (pain score 7-10).   Encourage ROM.  Recommend:  Follow-up Information     [facility]   Specialty: Specialist Why: If worsening or failing to improve as anticipated. Contact information: [address] [place] [address] [phone]                [place] Controlled Substances Registry consulted for this patient. I feel the risk/benefit ratio today is favorable for proceeding with this prescription for a controlled substance. Medication sedation precautions given.  Reviewed expectations re: course of current medical issues. Questions answered. Outlined signs and symptoms indicating need for more acute intervention. Patient verbalized understanding. After Visit Summary given.  SUBJECTIVE: History from: patient. Megan Lane is a 68 y.o. female who reports LEFT shoulder pain s/p fall 4 d ago. Sore and stiff. Movement exacerbates. No extremity sensation changes or weakness. Mobic with mild help. Pain affects sleep.  Past Surgical History:  Procedure Laterality Date   ABDOMINAL HYSTERECTOMY     COLONOSCOPY  2012   normal , at [facility]   KNEE SURGERY     LEG SURGERY     left lower extremity   TUBAL LIGATION     VENTRAL HERNIA REPAIR N/A 05/29/2019   Procedure: REPAIR OF VENTRAL HERNIA WITH MESH;  Surgeon: Harriette Bouillon, MD;  Location: [facility] OR;  Service: General;  Laterality: N/A;      OBJECTIVE:  Vitals:   05/17/23 1619  BP: (!) 148/73  Pulse: 82  Resp: 18  Temp: 98.2  F (36.8 C)  TempSrc: Oral  SpO2: 99%    General appearance: alert; no distress HEENT: [place]; AT Neck: supple with FROM Resp: unlabored respirations Extremities: LUE: warm with well perfused appearance; poorly localized moderate tenderness over left anterior shoulder; without gross deformities; swelling: none; bruising: none; shoulder ROM: limited by reported pain CV: brisk extremity capillary refill of LUE; 2+ radial pulse of LUE. Skin: warm and dry; no visible rashes Neurologic: gait normal; normal sensation and strength of LUE Psychological: alert and cooperative; normal mood and affect  Imaging: No results found.    Allergies  Allergen Reactions   Penicillins Rash    Did it involve swelling of the face/tongue/throat, SOB, or low BP? {Y/N/UNK:3040802 Did it involve sudden or severe rash/hives, skin peeling, or any reaction on the inside of your mouth or nose? Y Did you need to seek medical attention at a hospital or doctor's office? Y When did it last happen?  Several years ago     If all above answers are "NO", may proceed with cephalosporin use.     Past Medical History:  Diagnosis Date   Asthma    as a child   Diabetes mellitus    Hypercholesterolemia    Hypertension    Stroke (HCC) 04/2021   TIA   Ventral hernia    Wears glasses    Social History   Socioeconomic History   Marital status: Single    Spouse name: Not on file   Number of children: Not on  file   Years of education: Not on file   Highest education level: Not on file  Occupational History   Occupation: Part time (04/13/20)  Tobacco Use   Smoking status: Former    Current packs/day: 0.00    Average packs/day: 3.0 packs/day for 35.0 years (105.0 ttl pk-yrs)    Types: Cigarettes    Start date: 08/12/1970    Quit date: 08/11/2005    Years since quitting: 17.7   Smokeless tobacco: Never   Tobacco comments:    Age 67 start  Vaping Use   Vaping status: Never Used  Substance and Sexual Activity    Alcohol use: Never   Drug use: Never   Sexual activity: Not Currently    Birth control/protection: Post-menopausal, Surgical  Other Topics Concern   Not on file  Social History Narrative   Lives with son   Right Handed   Drinks rarely caffeine   Social Drivers of Health   Financial Resource Strain: Not on file  Food Insecurity: Not on file  Transportation Needs: Not on file  Physical Activity: Not on file  Stress: Not on file  Social Connections: Not on file   Family History  Problem Relation Age of Onset   Diabetes Mother    Hypertension Mother    Stroke Mother    Breast cancer Sister 31   Heart disease Brother    Colon cancer Neg Hx    Colon polyps Neg Hx    Esophageal cancer Neg Hx    Rectal cancer Neg Hx    Stomach cancer Neg Hx    Past Surgical History:  Procedure Laterality Date   ABDOMINAL HYSTERECTOMY     COLONOSCOPY  2012   normal , at [facility]   KNEE SURGERY     LEG SURGERY     left lower extremity   TUBAL LIGATION     VENTRAL HERNIA REPAIR N/A 05/29/2019   Procedure: REPAIR OF VENTRAL HERNIA WITH MESH;  Surgeon: Harriette Bouillon, MD;  Location: [facility] OR;  Service: General;  Laterality: Vertis Kelch, MD 05/17/23 1755

## 2023-05-28 ENCOUNTER — Other Ambulatory Visit: Payer: Self-pay | Admitting: Emergency Medicine

## 2023-05-28 DIAGNOSIS — E1140 Type 2 diabetes mellitus with diabetic neuropathy, unspecified: Secondary | ICD-10-CM

## 2023-05-30 ENCOUNTER — Ambulatory Visit: Payer: Self-pay

## 2023-05-30 NOTE — Telephone Encounter (Signed)
  Chief Complaint: shoulder pain Symptoms: pt reports falling in church on Sunday and landing on left shoulder. Pt reports getting her foot stuck in the carpet Frequency: since Sunday Pertinent Negatives: Patient denies CP, SOB Disposition: []ED /[]Urgent Care (no appt availability in office) / [x]Appointment(In office/virtual)/ [] [place] [facility]/ []Home Care/ []Refused Recommended Disposition /[][facility]/ [] Follow-up with PCP Additional Notes: patient called with concerns for left shoulder pain. Patient endorses falling at church-getting her foot caught on carpet. Patient states she fell on her shoulder. Patient reports pain only started today. Endorses taking advil around the clock.  Per protocol, patient is recommended to be seen in three days. Patient is given the option of being seen at UC "I don\'t like going to Urgent Care. They never give me a clear diagnosis." Appointment made for tomorrow in PCP office for 3:00 Pm with another provider. Patient verbalized understanding and all questions answered.    Copied from CRM #[id number]. Topic: Clinical - Red Word Triage >> May 30, 2023  4:25 PM China J wrote: Red Word that prompted transfer to Nurse Triage: Left shoulder injured. Severe pain. Reason for Disposition  [1] MODERATE pain (e.g., interferes with normal activities) AND [2] present > 3 days  Answer Assessment - Initial Assessment Questions 1. ONSET: "When did the pain start?"     Patient reports falling at church-patient reports that she thinks her foot got caught on the carpet and she landed on her shoulder 2. LOCATION: "Where is the pain located?"     Left shoulder 3. PAIN: "How bad is the pain?" (Scale 1-10; or mild, moderate, severe)   - MILD (1-3): doesn\'t interfere with normal activities   - MODERATE (4-7): interferes with normal activities (e.g., work or school) or awakens from sleep   - SEVERE (8-10): excruciating pain, unable to do any normal activities,  unable to move arm at all due to pain     10  4. WORK OR EXERCISE: "Has there been any recent work or exercise that involved this part of the body?"     no 5. CAUSE: "What do you think is causing the shoulder pain?"     S/p fall 6. OTHER SYMPTOMS: "Do you have any other symptoms?" (e.g., neck pain, swelling, rash, fever, numbness, weakness)     no  Protocols used: Shoulder Pain-A-AH

## 2023-05-31 ENCOUNTER — Ambulatory Visit (INDEPENDENT_AMBULATORY_CARE_PROVIDER_SITE_OTHER): Admitting: Nurse Practitioner

## 2023-05-31 ENCOUNTER — Ambulatory Visit (INDEPENDENT_AMBULATORY_CARE_PROVIDER_SITE_OTHER)

## 2023-05-31 VITALS — BP 128/72 | HR 71 | Temp 98.1°F | Ht 73.0 in | Wt 197.0 lb

## 2023-05-31 DIAGNOSIS — M25512 Pain in left shoulder: Secondary | ICD-10-CM | POA: Diagnosis not present

## 2023-05-31 NOTE — Progress Notes (Signed)
   Established Patient Office Visit  Subjective   Patient ID: Megan Lane, female    DOB: 19-Sep-1955  Age: 68 y.o. MRN: [id number]  Chief Complaint  Patient presents with   left shoulder pain    Left shoulder pain: Pain x 2 weeks.  She fell at church and landed on her left shoulder.  Went to urgent care underwent x-ray which identified no fracture.  She was given exercises and prescribed hydrocodone .  Reports pain has not improved and does not respond well to the hydrocodone , additionally she reports feeling "off" when taking hydrocodone  so she does not want take this any longer.  She is also prescribed meloxicam  and cyclobenzaprine  which she reports has not really helped pain either.  Rates pain at 10 out of 10.  Also reports some numbness and tingling in her fingertips, no radiation of pain up to her neck, no fever.  Only experiences relief when she rests her arm across her chest.  Would like sling.    ROS: see HPI    Objective:     BP 128/72   Pulse 71   Temp 98.1 F (36.7 C) (Temporal)   Ht 6\' 1"  (1.854 m)   Wt 197 lb (89.4 kg)   SpO2 96%   BMI 25.99 kg/m    Physical Exam Vitals reviewed.  Constitutional:      General: She is not in acute distress.    Appearance: Normal appearance.  HENT:     Head: Normocephalic and atraumatic.  Neck:     Vascular: No carotid bruit.  Cardiovascular:     Rate and Rhythm: Normal rate and regular rhythm.     Pulses: Normal pulses.     Heart sounds: Normal heart sounds.  Pulmonary:     Effort: Pulmonary effort is normal.     Breath sounds: Normal breath sounds.  Musculoskeletal:     Right shoulder: Normal.     Left shoulder: Tenderness present. No swelling or deformity. Decreased range of motion. Normal pulse.  Skin:    General: Skin is warm and dry.  Neurological:     General: No focal deficit present.     Mental Status: She is alert and oriented to person, place, and time.     Sensory: Sensation is intact.     Motor:  Motor function is intact.  Psychiatric:        Mood and Affect: Mood normal.        Behavior: Behavior normal.        Judgment: Judgment normal.      No results found for any visits on 05/31/23.    The ASCVD Risk score (Arnett DK, et al., 2019) failed to calculate for the following reasons:   Risk score cannot be calculated because patient has a medical history suggesting prior/existing ASCVD    Assessment & Plan:   Problem List Items Addressed This Visit       Other   Acute pain of left shoulder - Primary   Acute We will rex-ray shoulder today.  Order urgent referral to orthopedics, provide patient with sling so she can rest shoulder.  Encourage patient to use Tylenol , meloxicam , Flexeril  as needed.      Relevant Orders   Ambulatory referral to Orthopedic Surgery   DG Shoulder Left    Return if symptoms worsen or fail to improve.    Zorita Hiss, NP

## 2023-05-31 NOTE — Assessment & Plan Note (Signed)
 Acute We will rex-ray shoulder today.  Order urgent referral to orthopedics, provide patient with sling so she can rest shoulder.  Encourage patient to use Tylenol , meloxicam , Flexeril  as needed.

## 2023-05-31 NOTE — Addendum Note (Signed)
 Addended by: Arch Beans, Chelsea Cordia on: 05/31/2023 04:35 PM   Modules accepted: Orders

## 2023-06-01 ENCOUNTER — Telehealth: Payer: Self-pay | Admitting: Nurse Practitioner

## 2023-06-01 NOTE — Telephone Encounter (Signed)
 Pt aware and verbalized understanding.

## 2023-06-01 NOTE — Telephone Encounter (Signed)
 Please call patient and let her know that her shoulder xray does not show any fracture or dislocation. She should continue to rest shoulder in sling, use heat/ice, tylenol , meloxicam  and see the orthopedist once they are able to schedule her for further evaluation.

## 2023-06-05 ENCOUNTER — Other Ambulatory Visit (HOSPITAL_COMMUNITY): Payer: Self-pay

## 2023-06-05 ENCOUNTER — Ambulatory Visit (INDEPENDENT_AMBULATORY_CARE_PROVIDER_SITE_OTHER): Admitting: Physician Assistant

## 2023-06-05 DIAGNOSIS — M25512 Pain in left shoulder: Secondary | ICD-10-CM | POA: Diagnosis not present

## 2023-06-05 MED ORDER — ACETAMINOPHEN-CODEINE 300-30 MG PO TABS
1.0000 | ORAL_TABLET | Freq: Two times a day (BID) | ORAL | 0 refills | Status: DC | PRN
  Filled 2023-06-05 – 2023-10-30 (×2): qty 30, 15d supply, fill #0

## 2023-06-05 NOTE — Progress Notes (Signed)
 Office Visit Note   Patient: Megan Lane           Date of Birth: 03-18-1955           MRN: [id number] Visit Date: 06/05/2023              Requested by: Zorita Hiss, NP [address] [place],  [place] [address] PCP: Elvira Hammersmith, MD   Assessment & Plan: Visit Diagnoses:  1. Acute pain of left shoulder     Plan: Acute left shoulder pain concerning for rotator cuff pathology given her age, mechanism of injury and physical exam.  At this point, I have recommended MRI of the left shoulder to assess for structural abnormalities.  She will follow-up with Dr. Christiane Cowing once this has been completed.  Call with concerns or questions.  Follow-Up Instructions: Return for f/u with Dr. Christiane Cowing after MRI.   Orders:  No orders of the defined types were placed in this encounter.  No orders of the defined types were placed in this encounter.     Procedures: No procedures performed   Clinical Data: No additional findings.   Subjective: Chief Complaint  Patient presents with   Left Shoulder - Pain    HPI patient is a pleasant 68 year old female who comes in today with left shoulder pain.  She sustained a mechanical fall at church on 05/17/2023 where she landed on an outstretched left hand and then hit her shoulder.  She was seen in an urgent care where x-rays were obtained.  These were negative for fracture.  She had continued pain and was seen by her PCP about a week ago.  Repeat x-rays were obtained which were again unremarkable.  She was placed in a sling.  She is here today for further evaluation and treatment recommendation.  She is having pain to the entire shoulder.  Pain is worse with range of motion specifically forward flexion.  She does note some weakness to the left upper extremity as well.  She has been taking Tylenol  and Advil  without significant relief.  Review of Systems as detailed in HPI.  All others reviewed and are negative.   Objective: Vital Signs: There were  no vitals taken for this visit.  Physical Exam well-developed well-nourished female in no acute distress.  Alert and oriented x 3.  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  She can actively forward flex to about 140 degrees.  I can passively get her to about 160 degrees.  External rotation to approximately 40 degrees.  Internal rotation to her back pocket.  She has a negative drop arm test.  She does have pain with speeds and empty can testing.  3 out of 5 strength with resisted internal rotation, external rotation and resisted forward flexion.  She is neurovascularly intact distally.  Specialty Comments:  No specialty comments available.  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   HLD (hyperlipidemia) 07/08/2016   Dyslipidemia associated with  type 2 diabetes mellitus (HCC) 10/16/2013   Essential hypertension, benign 04/30/2013   Past Medical History:  Diagnosis Date   Asthma    as a child   Diabetes mellitus    Hypercholesterolemia    Hypertension    Stroke (HCC) 04/2021   TIA   Ventral hernia    Wears glasses     Family History  Problem Relation Age of Onset   Diabetes Mother    Hypertension Mother    Stroke Mother    Breast cancer Sister 42   Heart disease Brother    Colon cancer Neg Hx    Colon polyps Neg Hx    Esophageal cancer Neg Hx    Rectal cancer Neg Hx    Stomach cancer Neg Hx     Past Surgical History:  Procedure Laterality  Date   ABDOMINAL HYSTERECTOMY     COLONOSCOPY  2012   normal , at [facility]   KNEE SURGERY     LEG SURGERY     left lower extremity   TUBAL LIGATION     VENTRAL HERNIA REPAIR N/A 05/29/2019   Procedure: REPAIR OF VENTRAL HERNIA WITH MESH;  Surgeon: Sim Dryer, MD;  Location: [facility] OR;  Service: General;  Laterality: N/A;   Social History   Occupational History   Occupation: Part time (04/13/20)  Tobacco Use   Smoking status: Former    Current packs/day: 0.00    Average packs/day: 3.0 packs/day for 35.0 years (105.0 ttl pk-yrs)    Types: Cigarettes    Start date: 08/12/1970    Quit date: 08/11/2005    Years since quitting: 17.8   Smokeless tobacco: Never   Tobacco comments:    Age 68 start  Vaping Use   Vaping status: Never Used  Substance and Sexual Activity   Alcohol use: Never   Drug use: Never   Sexual activity: Not Currently    Birth control/protection: Post-menopausal, Surgical

## 2023-06-05 NOTE — Addendum Note (Signed)
 Addended by: Valecia Beske on: 06/05/2023 11:28 AM   Modules accepted: Orders

## 2023-06-05 NOTE — Addendum Note (Signed)
 Addended by: Sandie Cross on: 06/05/2023 10:11 AM   Modules accepted: Orders

## 2023-06-13 ENCOUNTER — Encounter: Payer: Self-pay | Admitting: Orthopaedic Surgery

## 2023-06-15 ENCOUNTER — Other Ambulatory Visit

## 2023-06-15 ENCOUNTER — Other Ambulatory Visit (HOSPITAL_COMMUNITY): Payer: Self-pay

## 2023-06-17 ENCOUNTER — Ambulatory Visit
Admission: RE | Admit: 2023-06-17 | Discharge: 2023-06-17 | Discharge status: Home / Self Care | Source: Ambulatory Visit | Attending: Orthopaedic Surgery | Admitting: Orthopaedic Surgery

## 2023-06-17 ENCOUNTER — Other Ambulatory Visit

## 2023-06-17 DIAGNOSIS — M25512 Pain in left shoulder: Secondary | ICD-10-CM

## 2023-06-19 ENCOUNTER — Other Ambulatory Visit (HOSPITAL_COMMUNITY): Payer: Self-pay

## 2023-06-22 ENCOUNTER — Ambulatory Visit (INDEPENDENT_AMBULATORY_CARE_PROVIDER_SITE_OTHER)

## 2023-06-22 VITALS — Ht 73.0 in | Wt 197.0 lb

## 2023-06-22 DIAGNOSIS — I152 Hypertension secondary to endocrine disorders: Secondary | ICD-10-CM | POA: Diagnosis not present

## 2023-06-22 DIAGNOSIS — E1142 Type 2 diabetes mellitus with diabetic polyneuropathy: Secondary | ICD-10-CM | POA: Diagnosis not present

## 2023-06-22 DIAGNOSIS — E785 Hyperlipidemia, unspecified: Secondary | ICD-10-CM | POA: Diagnosis not present

## 2023-06-22 DIAGNOSIS — E1169 Type 2 diabetes mellitus with other specified complication: Secondary | ICD-10-CM | POA: Diagnosis not present

## 2023-06-22 DIAGNOSIS — E1159 Type 2 diabetes mellitus with other circulatory complications: Secondary | ICD-10-CM

## 2023-06-22 DIAGNOSIS — Z0000 Encounter for general adult medical examination without abnormal findings: Secondary | ICD-10-CM

## 2023-06-22 NOTE — Patient Instructions (Addendum)
 [name] , Thank you for taking time out of your busy schedule to complete your Annual Wellness Visit with me. I enjoyed our conversation and look forward to speaking with you again next year. I, as well as your care team,  appreciate your ongoing commitment to your health goals. Please review the following plan we discussed and let me know if I can assist you in the future. Your Game plan/ To Do List    Referrals: If you haven't heard from the office you've been referred to, please reach out to them at the phone provided.  You have an order for:  [x]   Bone Density     Please call for appointment:  [facility] [address] [place], [place] [address] [phone]   Make sure to wear two-piece clothing.  No lotions, powders, or deodorants the day of the appointment. Make sure to bring picture ID and insurance card.  Bring list of medications you are currently taking including any supplements.   Follow up Visits: Next Medicare AWV with our clinical staff: 06/24/2024.   Have you seen your provider in the last 6 months (3 months if uncontrolled diabetes)? Yes Next Office Visit with your provider: 08/21/2023.  Clinician Recommendations:  Aim for 30 minutes of exercise or brisk walking, 6-8 glasses of water, and 5 servings of fruits and vegetables each day. You are due for a 2nd pneumonia vaccine.  You can get these done at your local pharmacy.        This is a list of the screening recommended for you and due dates:  Health Maintenance  Topic Date Due   Zoster (Shingles) Vaccine (1 of 2) Never done   Pneumonia Vaccine (2 of 2 - PCV) 10/30/2019   DEXA scan (bone density measurement)  Never done   Complete foot exam   03/29/2022   Yearly kidney health urinalysis for diabetes  03/16/2023   Yearly kidney function blood test for diabetes  08/14/2023   Hemoglobin A1C  08/21/2023   Flu Shot  09/07/2023   COVID-19 Vaccine (4 - Mixed Product risk 2024-25 season)  09/09/2023   Eye exam for diabetics  02/29/2024   Medicare Annual Wellness Visit  06/21/2024   Mammogram  12/19/2024   Colon Cancer Screening  12/28/2028   Hepatitis C Screening  Completed   HPV Vaccine  Aged Out   Meningitis B Vaccine  Aged Out   DTaP/Tdap/Td vaccine  Discontinued    Advanced directives: (Declined) Advance directive discussed with you today. Even though you declined this today, please call our office should you change your mind, and we can give you the proper paperwork for you to fill out. Advance Care Planning is important because it:  [x]  Makes sure you receive the medical care that is consistent with your values, goals, and preferences  [x]  It provides guidance to your family and loved ones and reduces their decisional burden about whether or not they are making the right decisions based on your wishes.  Follow the link provided in your after visit summary or read over the paperwork we have mailed to you to help you started getting your Advance Directives in place. If you need assistance in completing these, please reach out to us  so that we can help you!  See attachments for Preventive Care and Fall Prevention Tips.

## 2023-06-22 NOTE — Progress Notes (Signed)
 Subjective:   Megan Lane is a 68 y.o. who presents for a Medicare Wellness preventive visit.  As a reminder, Annual Wellness Visits don't include a physical exam, and some assessments may be limited, especially if this visit is performed virtually. We may recommend an in-person visit if needed.  Visit Complete: Virtual I connected with  Megan Lane on 06/22/23 by a audio enabled telemedicine application and verified that I am speaking with the correct person using two identifiers.  Patient Location: Home  Provider Location: Office/Clinic  I discussed the limitations of evaluation and management by telemedicine. The patient expressed understanding and agreed to proceed.  Vital Signs: Because this visit was a virtual/telehealth visit, some criteria may be missing or patient reported. Any vitals not documented were not able to be obtained and vitals that have been documented are patient reported.  VideoDeclined- This patient declined Librarian, academic. Therefore the visit was completed with audio only.  Persons Participating in Visit: Patient.  AWV Questionnaire: No: Patient Medicare AWV questionnaire was not completed prior to this visit.  Cardiac Risk Factors include: advanced age (>11men, >82 women);diabetes mellitus;dyslipidemia;hypertension     Objective:     Today's Vitals   06/22/23 0816  Weight: 197 lb (89.4 kg)  Height: 6\' 1"  (1.854 m)  PainSc: 5    Body mass index is 25.99 kg/m.     01/02/2023    4:28 PM 12/04/2022    5:44 AM 08/19/2021    8:29 PM 01/22/2020   10:39 AM 08/18/2019    3:13 PM 05/22/2019    9:31 AM 11/20/2017    3:36 PM  Advanced Directives  Does Patient Have a Medical Advance Directive? No No No No No No No  Would patient like information on creating a medical advance directive? No - Patient declined   No - Patient declined  No - Patient declined No - Patient declined    Current Medications  (verified) Outpatient Encounter Medications as of 06/22/2023  Medication Sig   acetaminophen -codeine  (TYLENOL  #3) 300-30 MG tablet Take 1 tablet by mouth 2 (two) times daily as needed.   aspirin  EC 81 MG tablet Take 1 tablet (81 mg total) by mouth daily. Swallow whole.   Blood Glucose Monitoring Suppl (CONTOUR NEXT MONITOR) w/Device KIT 1 each by Does not apply route 3 (three) times daily as needed.   cyclobenzaprine  (FLEXERIL ) 10 MG tablet Take 1 tablet (10 mg total) by mouth 3 (three) times daily as needed for muscle spasms.   DULoxetine  (CYMBALTA ) 60 MG capsule Take 1 capsule by mouth once daily   empagliflozin  (JARDIANCE ) 25 MG TABS tablet Take 1 tablet (25 mg total) by mouth daily before breakfast.   gabapentin  (NEURONTIN ) 300 MG capsule Take 2 capsules (600 mg total) by mouth 3 (three) times daily.   glipiZIDE  (GLUCOTROL ) 10 MG tablet Take 1 tablet (10 mg total) by mouth 2 (two) times daily before a meal.   glucose blood (CONTOUR NEXT TEST) test strip Use as instructed   hydrochlorothiazide  (HYDRODIURIL ) 25 MG tablet Take 1 tablet (25 mg total) by mouth daily.   HYDROcodone -acetaminophen  (NORCO/VICODIN) 5-325 MG tablet Take 1 tablet by mouth every 6 (six) hours as needed for moderate pain (pain score 4-6) or severe pain (pain score 7-10).   Lancets (ACCU-CHEK SOFT TOUCH) lancets Use as instructed   meloxicam  (MOBIC ) 15 MG tablet Take 1 tablet (15 mg total) by mouth daily.   methocarbamol  (ROBAXIN -750) 750 MG tablet Take 1 tablet (750  mg total) by mouth 2 (two) times daily as needed for muscle spasms.   metoprolol  tartrate (LOPRESSOR ) 100 MG tablet Take 1 tablet (100 mg total) by mouth as directed. Take (1) tablet two hours before your CT scan   nystatin -triamcinolone  (MYCOLOG II) cream Apply 1 Application topically to corners of mouth 4 times daily.   rosuvastatin  (CRESTOR ) 20 MG tablet Take 1 tablet (20 mg total) by mouth daily.   tirzepatide  (MOUNJARO ) 10 MG/0.5ML Pen Inject 10 mg into the  skin once a week.   No facility-administered encounter medications on file as of 06/22/2023.    Allergies (verified) Penicillins   History: Past Medical History:  Diagnosis Date   Asthma    as a child   Diabetes mellitus    Hypercholesterolemia    Hypertension    Stroke (HCC) 04/2021   TIA   Ventral hernia    Wears glasses    Past Surgical History:  Procedure Laterality Date   ABDOMINAL HYSTERECTOMY     COLONOSCOPY  2012   normal , at [facility]   KNEE SURGERY     LEG SURGERY     left lower extremity   TUBAL LIGATION     VENTRAL HERNIA REPAIR N/A 05/29/2019   Procedure: REPAIR OF VENTRAL HERNIA WITH MESH;  Surgeon: Sim Dryer, MD;  Location: [facility] OR;  Service: General;  Laterality: N/A;   Family History  Problem Relation Age of Onset   Diabetes Mother    Hypertension Mother    Stroke Mother    Breast cancer Sister 79   Heart disease Brother    Colon cancer Neg Hx    Colon polyps Neg Hx    Esophageal cancer Neg Hx    Rectal cancer Neg Hx    Stomach cancer Neg Hx    Social History   Socioeconomic History   Marital status: Single    Spouse name: Not on file   Number of children: 2   Years of education: Not on file   Highest education level: Not on file  Occupational History   Occupation: Part time (04/13/20)  Tobacco Use   Smoking status: Former    Current packs/day: 0.00    Average packs/day: 3.0 packs/day for 35.0 years (105.0 ttl pk-yrs)    Types: Cigarettes    Start date: 08/12/1970    Quit date: 08/11/2005    Years since quitting: 17.8   Smokeless tobacco: Never   Tobacco comments:    Age 58 start  Vaping Use   Vaping status: Never Used  Substance and Sexual Activity   Alcohol use: Never   Drug use: Never   Sexual activity: Not Currently    Birth control/protection: Post-menopausal, Surgical  Other Topics Concern   Not on file  Social History Narrative   Niece lives with her   Right Handed   Drinks rarely caffeine   Social Drivers of Health    Financial Resource Strain: Low Risk  (06/22/2023)   Overall Financial Resource Strain (CARDIA)    Difficulty of Paying Living Expenses: Not very hard  Food Insecurity: No Food Insecurity (06/22/2023)   Hunger Vital Sign    Worried About Running Out of Food in the Last Year: Never true    Ran Out of Food in the Last Year: Never true  Transportation Needs: No Transportation Needs (06/22/2023)   PRAPARE - Administrator, Civil Service (Medical): No    Lack of Transportation (Non-Medical): No  Physical Activity: Unknown (06/22/2023)  Exercise Vital Sign    Days of Exercise per Week: 3 days    Minutes of Exercise per Session: Not on file  Stress: No Stress Concern Present (06/22/2023)   [employer] of Occupational Health - Occupational Stress Questionnaire    Feeling of Stress : Not at all  Social Connections: Moderately Integrated (06/22/2023)   Social Connection and Isolation Panel [NHANES]    Frequency of Communication with Friends and Family: More than three times a week    Frequency of Social Gatherings with Friends and Family: More than three times a week    Attends Religious Services: More than 4 times per year    Active Member of [employer] or Organizations: Yes    Attends Banker Meetings: 1 to 4 times per year    Marital Status: Widowed    Tobacco Counseling Counseling given: Not Answered Tobacco comments: Age 89 start    Clinical Intake:  Pre-visit preparation completed: Yes  Pain : 0-10 Pain Score: 5  Pain Type: Chronic pain (Recent MRI) Pain Location: Shoulder Pain Orientation: Left Pain Descriptors / Indicators: Aching, Discomfort Pain Onset: More than a month ago Pain Frequency: Constant Pain Relieving Factors: Tylenol   Pain Relieving Factors: Tylenol   BMI - recorded: 25.99 Nutritional Status: BMI 25 -29 Overweight Nutritional Risks: None Diabetes: Yes Did pt. bring in CBG monitor from home?: No  Lab Results  Component Value  Date   HGBA1C 6.7 (A) 02/21/2023   HGBA1C 10.5 (A) 10/30/2022   HGBA1C 9.2 (A) 06/13/2022     How often do you need to have someone help you when you read instructions, pamphlets, or other written materials from your doctor or pharmacy?: 1 - Never  Interpreter Needed?: No  Information entered by :: Bryan Omura, RMA   Activities of Daily Living     06/22/2023    8:19 AM  In your present state of health, do you have any difficulty performing the following activities:  Hearing? 0  Vision? 0  Difficulty concentrating or making decisions? 0  Walking or climbing stairs? 0  Dressing or bathing? 0  Doing errands, shopping? 0  Preparing Food and eating ? N  Using the Toilet? N  In the past six months, have you accidently leaked urine? N  Do you have problems with loss of bowel control? N  Managing your Medications? N  Managing your Finances? N  Housekeeping or managing your Housekeeping? N    Patient Care Team: Elvira Hammersmith, MD as PCP - General (Internal Medicine)  Indicate any recent Medical Services you may have received from other than [facility] providers in the past year (date may be approximate).     Assessment:    This is a routine wellness examination for Megan Lane.  Hearing/Vision screen Hearing Screening - Comments:: Denies hearing difficulties   Vision Screening - Comments:: Wears eyeglasses/ [facility]   Goals Addressed               This Visit's Progress     Patient Stated (pt-stated)        To get off insulin .       Depression Screen     06/22/2023    8:28 AM 04/11/2023   10:49 AM 02/21/2023    3:03 PM 12/07/2022    2:40 PM 10/30/2022    1:15 PM 08/21/2022    4:02 PM 06/13/2022    1:05 PM  PHQ 2/9 Scores  PHQ - 2 Score 0 0 0 0 0  0 0  PHQ- 9 Score 0          Fall Risk     06/22/2023    8:23 AM 04/11/2023   10:49 AM 02/21/2023    3:03 PM 12/07/2022    2:40 PM 10/30/2022    1:15 PM  Fall Risk   Falls in the past year? 1 0 0 0 0   Number falls in past yr: 0 0 0 0 0  Injury with Fall? 1 0 0 0 0  Comment Lt shoulder pain/[facility] to [facility]      Risk for fall due to : Impaired balance/gait No Fall Risks No Fall Risks No Fall Risks No Fall Risks  Follow up Falls evaluation completed;Falls prevention discussed Falls evaluation completed Falls evaluation completed Falls evaluation completed Falls evaluation completed    MEDICARE RISK AT HOME:  Medicare Risk at Home Any stairs in or around the home?: No Home free of loose throw rugs in walkways, pet beds, electrical cords, etc?: Yes Adequate lighting in your home to reduce risk of falls?: Yes Life alert?: No Use of a cane, walker or w/c?: Yes (cane when gout flares) Grab bars in the bathroom?: Yes Shower chair or bench in shower?: Yes Elevated toilet seat or a handicapped toilet?: Yes  TIMED UP AND GO:  Was the test performed?  No  Cognitive Function: Declined/Normal: No cognitive concerns noted by patient or family. Patient alert, oriented, able to answer questions appropriately and recall recent events. No signs of memory loss or confusion.        Immunizations Immunization History  Administered Date(s) Administered   Fluad Quad(high Dose 65+) 03/15/2022   Fluad Trivalent(High Dose 65+) 10/30/2022   Influenza,inj,Quad PF,6+ Mos 02/09/2015, 12/04/2016, 10/30/2018, 11/18/2019, 11/01/2020   Influenza,trivalent, recombinat, inj, PF 03/18/2013   PFIZER(Purple Top)SARS-COV-2 Vaccination 04/13/2019, 05/13/2019   Pneumococcal Polysaccharide-23 02/25/2018, 10/30/2018   Tetanus 03/18/2013   Unspecified SARS-COV-2 Vaccination 03/12/2023    Screening Tests Health Maintenance  Topic Date Due   Zoster Vaccines- Shingrix (1 of 2) Never done   Pneumonia Vaccine 75+ Years old (2 of 2 - PCV) 10/30/2019   DEXA SCAN  Never done   FOOT EXAM  03/29/2022   Diabetic kidney evaluation - Urine ACR  03/16/2023   Diabetic kidney evaluation - eGFR measurement  08/14/2023   HEMOGLOBIN  A1C  08/21/2023   INFLUENZA VACCINE  09/07/2023   COVID-19 Vaccine (4 - Mixed Product risk 2024-25 season) 09/09/2023   OPHTHALMOLOGY EXAM  02/29/2024   Medicare Annual Wellness (AWV)  06/21/2024   MAMMOGRAM  12/19/2024   Colonoscopy  12/28/2028   Hepatitis C Screening  Completed   HPV VACCINES  Aged Out   Meningococcal B Vaccine  Aged Out   DTaP/Tdap/Td  Discontinued    Health Maintenance  Health Maintenance Due  Topic Date Due   Zoster Vaccines- Shingrix (1 of 2) Never done   Pneumonia Vaccine 28+ Years old (2 of 2 - PCV) 10/30/2019   DEXA SCAN  Never done   FOOT EXAM  03/29/2022   Diabetic kidney evaluation - Urine ACR  03/16/2023   Health Maintenance Items Addressed: DEXA ordered, Diabetic Foot Exam scheduled, UACR (Urine Albumin:Creatinine Ratio), See Nurse Notes  Additional Screening:  Vision Screening: Recommended annual ophthalmology exams for early detection of glaucoma and other disorders of the eye.  Dental Screening: Recommended annual dental exams for proper oral hygiene  Community Resource Referral / Chronic Care Management: CRR required this visit?  No  CCM required this visit?  No   Plan:    I have personally reviewed and noted the following in the patient's chart:   Medical and social history Use of alcohol, tobacco or illicit drugs  Current medications and supplements including opioid prescriptions. Patient is not currently taking opioid prescriptions. Functional ability and status Nutritional status Physical activity Advanced directives List of other physicians Hospitalizations, surgeries, and ER visits in previous 12 months Vitals Screenings to include cognitive, depression, and falls Referrals and appointments  In addition, I have reviewed and discussed with patient certain preventive protocols, quality metrics, and best practice recommendations. A written personalized care plan for preventive services as well as general preventive health  recommendations were provided to patient.   Ibraheem Voris L Theia Dezeeuw, CMA   06/22/2023   After Visit Summary: (MyChart) Due to this being a telephonic visit, the after visit summary with patients personalized plan was offered to patient via MyChart   Notes: Please refer to Routing Comments.

## 2023-06-25 ENCOUNTER — Other Ambulatory Visit (HOSPITAL_COMMUNITY): Payer: Self-pay

## 2023-06-26 ENCOUNTER — Telehealth: Payer: Self-pay

## 2023-06-26 ENCOUNTER — Other Ambulatory Visit (HOSPITAL_COMMUNITY): Payer: Self-pay

## 2023-06-26 NOTE — Telephone Encounter (Signed)
 Pharmacy Patient Advocate Encounter   Received notification from CoverMyMeds that prior authorization for Mounjaro  10MG /0.5ML auto-injectors  is required/requested.   Insurance verification completed.   The patient is insured through [employer] .   Per test claim: Refill too soon. PA is not needed at this time. Medication was filled 05/13. Next eligible fill date is 06/03.

## 2023-06-28 ENCOUNTER — Other Ambulatory Visit (HOSPITAL_COMMUNITY): Payer: Self-pay

## 2023-06-29 ENCOUNTER — Other Ambulatory Visit (HOSPITAL_COMMUNITY): Payer: Self-pay

## 2023-07-06 ENCOUNTER — Other Ambulatory Visit: Payer: Self-pay | Admitting: Emergency Medicine

## 2023-07-06 DIAGNOSIS — E1140 Type 2 diabetes mellitus with diabetic neuropathy, unspecified: Secondary | ICD-10-CM

## 2023-07-06 NOTE — Telephone Encounter (Signed)
 Copied from CRM [id number]. Topic: Clinical - Medication Refill >> Jul 06, 2023  4:49 PM Clyde Darling P wrote: Medication: DULoxetine  (CYMBALTA ) 60 MG capsule  Has the patient contacted their pharmacy? Yes- [facility] refills  (Agent: If no, request that the patient contact the pharmacy for the refill. If patient does not wish to contact the pharmacy document the reason why and proceed with request.) (Agent: If yes, when and what did the pharmacy advise?)  This is the patient's preferred pharmacy:  [facility] [address] [place] [address] Phone: [phone] Fax: [phone]  Is this the correct pharmacy for this prescription? Yes If no, delete pharmacy and type the correct one.   Has the prescription been filled recently? No  Is the patient out of the medication? Yes  Has the patient been seen for an appointment in the last year OR does the patient have an upcoming appointment? Yes  Can we respond through MyChart? Yes  Agent: Please be advised that Rx refills may take up to 3 business days. We ask that you follow-up with your pharmacy.

## 2023-07-08 ENCOUNTER — Other Ambulatory Visit: Payer: Self-pay | Admitting: Emergency Medicine

## 2023-07-08 DIAGNOSIS — M79641 Pain in right hand: Secondary | ICD-10-CM

## 2023-07-08 DIAGNOSIS — M150 Primary generalized (osteo)arthritis: Secondary | ICD-10-CM

## 2023-07-09 ENCOUNTER — Other Ambulatory Visit (HOSPITAL_COMMUNITY): Payer: Self-pay

## 2023-07-09 MED ORDER — MELOXICAM 15 MG PO TABS
15.0000 mg | ORAL_TABLET | Freq: Every day | ORAL | 1 refills | Status: DC
  Filled 2023-07-09 – 2023-07-20 (×2): qty 30, 30d supply, fill #0
  Filled 2023-09-08 – 2023-09-24 (×2): qty 30, 30d supply, fill #1

## 2023-07-10 ENCOUNTER — Other Ambulatory Visit (HOSPITAL_COMMUNITY): Payer: Self-pay

## 2023-07-10 MED ORDER — DULOXETINE HCL 60 MG PO CPEP
60.0000 mg | ORAL_CAPSULE | Freq: Every day | ORAL | 0 refills | Status: DC
  Filled 2023-07-10 – 2023-07-20 (×2): qty 90, 90d supply, fill #0

## 2023-07-13 ENCOUNTER — Other Ambulatory Visit (HOSPITAL_COMMUNITY): Payer: Self-pay

## 2023-07-19 ENCOUNTER — Other Ambulatory Visit (HOSPITAL_COMMUNITY): Payer: Self-pay

## 2023-07-20 ENCOUNTER — Other Ambulatory Visit (HOSPITAL_COMMUNITY): Payer: Self-pay

## 2023-07-31 ENCOUNTER — Encounter: Payer: Self-pay | Admitting: Sports Medicine

## 2023-07-31 ENCOUNTER — Other Ambulatory Visit: Payer: Self-pay

## 2023-07-31 ENCOUNTER — Ambulatory Visit (INDEPENDENT_AMBULATORY_CARE_PROVIDER_SITE_OTHER): Admitting: Sports Medicine

## 2023-07-31 ENCOUNTER — Other Ambulatory Visit (HOSPITAL_COMMUNITY): Payer: Self-pay

## 2023-07-31 ENCOUNTER — Ambulatory Visit (INDEPENDENT_AMBULATORY_CARE_PROVIDER_SITE_OTHER): Admitting: Physician Assistant

## 2023-07-31 DIAGNOSIS — G8929 Other chronic pain: Secondary | ICD-10-CM

## 2023-07-31 DIAGNOSIS — M25512 Pain in left shoulder: Secondary | ICD-10-CM | POA: Diagnosis not present

## 2023-07-31 MED ORDER — LIDOCAINE HCL 1 % IJ SOLN
2.0000 mL | INTRAMUSCULAR | Status: AC | PRN
Start: 2023-07-31 — End: 2023-07-31
  Administered 2023-07-31: 2 mL

## 2023-07-31 MED ORDER — METHYLPREDNISOLONE ACETATE 40 MG/ML IJ SUSP
80.0000 mg | INTRAMUSCULAR | Status: AC | PRN
  Administered 2023-07-31: 80 mg via INTRA_ARTICULAR

## 2023-07-31 MED ORDER — TRAMADOL HCL 50 MG PO TABS
50.0000 mg | ORAL_TABLET | Freq: Four times a day (QID) | ORAL | 1 refills | Status: DC | PRN
  Filled 2023-07-31: qty 30, 7d supply, fill #0
  Filled 2023-10-30: qty 30, 7d supply, fill #1

## 2023-07-31 MED ORDER — BUPIVACAINE HCL 0.25 % IJ SOLN
2.0000 mL | INTRAMUSCULAR | Status: AC | PRN
  Administered 2023-07-31: 2 mL via INTRA_ARTICULAR

## 2023-07-31 NOTE — Progress Notes (Signed)
   Procedure Note  Patient: Megan Lane             Date of Birth: 1955/09/14           MRN: [id number]             Visit Date: 07/31/2023  Procedures: Visit Diagnoses:  1. Chronic left shoulder pain    Large Joint Inj: L glenohumeral on 07/31/2023 9:37 AM Indications: pain Details: 22 G 3.5 in needle, ultrasound-guided posterior approach Medications: 2 mL lidocaine  1 %; 2 mL bupivacaine  0.25 %; 80 mg methylPREDNISolone  acetate 40 MG/ML Outcome: tolerated well, no immediate complications  US -guided glenohumeral joint injection, left shoulder After discussion on risks/benefits/indications, informed verbal consent was obtained. A timeout was then performed. The patient was positioned lying lateral recumbent on examination table. The patient's shoulder was prepped with betadine and multiple alcohol swabs and utilizing ultrasound guidance, the patient's glenohumeral joint was identified on ultrasound. Using ultrasound guidance a 22-gauge, 3.5 inch needle with a mixture of 2:2:2 cc's lidocaine :bupivicaine:depomedrol was directed from a lateral to medial direction via in-plane technique into the glenohumeral joint with visualization of appropriate spread of injectate into the joint. Patient tolerated the procedure well without immediate complications.      Procedure, treatment alternatives, risks and benefits explained, specific risks discussed. Consent was given by the patient. Immediately prior to procedure a time out was called to verify the correct patient, procedure, equipment, support staff and site/side marked as required. Patient was prepped and draped in the usual sterile fashion.     - patient tolerated procedure well, discussed post-injection protocol - follow-up with Dr. Antoinette Bimler as indicated; I am happy to see them as needed  Lonell Sprang, DO Primary Care Sports Medicine Physician  [facility] - Orthopedics  This note was dictated using Dragon  naturally speaking software and may contain errors in syntax, spelling, or content which have not been identified prior to signing this note.

## 2023-07-31 NOTE — Progress Notes (Signed)
 Office Visit Note   Patient: Megan Lane           Date of Birth: 1955-06-18           MRN: [id number] Visit Date: 07/31/2023              Requested by: Purcell Emil Schanz, MD [address] [place],  [place] [address] PCP: Purcell Emil Schanz, MD   Assessment & Plan: Visit Diagnoses:  1. Chronic left shoulder pain     Plan: Impression is left shoulder pain.  MRI remarkable for rotator cuff tendinitis and partial-thickness tearing as well as tendinitis to the proximal biceps.  She appears to be most symptomatic from the biceps.  We have discussed referral to Dr. Burnetta for intra-articular cortisone injection.  I would also like to start her in physical therapy due to her decreased range of motion as I do not want her to develop a frozen shoulder.  She will follow-up with us  as needed.  Follow-Up Instructions: Return for with brooks for left shoulder injection.   Orders:  Orders Placed This Encounter  Procedures   Ambulatory referral to Physical Therapy   Meds ordered this encounter  Medications   traMADol  (ULTRAM ) 50 MG tablet    Sig: Take 1 tablet (50 mg total) by mouth every 6 (six) hours as needed.    Dispense:  30 tablet    Refill:  1      Procedures: No procedures performed   Clinical Data: No additional findings.   Subjective: Chief Complaint  Patient presents with   Left Shoulder - Follow-up    Review MRI    HPI patient is a pleasant 68 year old female who comes in today to review MRI results of her left shoulder.  She was seen by me a couple months ago after she sustained a mechanical fall landing on her left outstretched arm.  Concern for rotator cuff pathology and MRI was ordered.  She has continued to exhibit pain, weakness and decreased range of motion following the injury.  Recent MRI results of the left shoulder show tendinosis of the supraspinatus and infraspinatus with partial interstitial tearing.  There is also noted to be  tenosynovitis and tendinitis within the biceps tendon.  She has not undergone cortisone injection of the left shoulder.  Review of Systems as detailed in HPI.  All others reviewed and are negative.   Objective: Vital Signs: There were no vitals taken for this visit.  Physical Exam well-developed well-nourished female in no acute distress.  Alert and oriented x 3.  Ortho Exam left shoulder exam: Somewhat hard to examine secondary to pain.  She does continue to endorse pain to the anterior shoulder and appears to be worse with speeds testing.  She is neurovascularly intact distally.  Specialty Comments:  No specialty comments available.  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   HLD (hyperlipidemia) 07/08/2016   Dyslipidemia associated with type 2 diabetes mellitus (HCC) 10/16/2013   Essential hypertension, benign 04/30/2013  Past Medical History:  Diagnosis Date   Asthma    as a child   Diabetes mellitus    Hypercholesterolemia    Hypertension    Stroke (HCC) 04/2021   TIA   Ventral hernia    Wears glasses     Family History  Problem Relation Age of Onset   Diabetes Mother    Hypertension Mother    Stroke Mother    Breast cancer Sister 56   Heart disease Brother    Colon cancer Neg Hx    Colon polyps Neg Hx    Esophageal cancer Neg Hx    Rectal cancer Neg Hx    Stomach cancer Neg Hx     Past Surgical History:  Procedure Laterality Date   ABDOMINAL HYSTERECTOMY      COLONOSCOPY  2012   normal , at [facility]   KNEE SURGERY     LEG SURGERY     left lower extremity   TUBAL LIGATION     VENTRAL HERNIA REPAIR N/A 05/29/2019   Procedure: REPAIR OF VENTRAL HERNIA WITH MESH;  Surgeon: Vanderbilt Ned, MD;  Location: [facility] OR;  Service: General;  Laterality: N/A;   Social History   Occupational History   Occupation: Part time (04/13/20)  Tobacco Use   Smoking status: Former    Current packs/day: 0.00    Average packs/day: 3.0 packs/day for 35.0 years (105.0 ttl pk-yrs)    Types: Cigarettes    Start date: 08/12/1970    Quit date: 08/11/2005    Years since quitting: 17.9   Smokeless tobacco: Never   Tobacco comments:    Age 58 start  Vaping Use   Vaping status: Never Used  Substance and Sexual Activity   Alcohol use: Never   Drug use: Never   Sexual activity: Not Currently    Birth control/protection: Post-menopausal, Surgical

## 2023-08-14 NOTE — Therapy (Signed)
 OUTPATIENT PHYSICAL THERAPY SHOULDER EVALUATION   Patient Name: Megan Lane MRN: [id number] DOB:[birth year], 68 y.o., female Today's Date: 08/15/2023   PT End of Session - 08/15/23 1004     Visit Number 1    Number of Visits --   1-2x/week   Date for PT Re-Evaluation 10/10/23    Authorization Type MCR - UHC    Progress Note Due on Visit 10    PT Start Time 0909    PT Stop Time 0948    PT Time Calculation (min) 39 min          Past Medical History:  Diagnosis Date   Asthma    as a child   Diabetes mellitus    Hypercholesterolemia    Hypertension    Stroke (HCC) 04/2021   TIA   Ventral hernia    Wears glasses    Past Surgical History:  Procedure Laterality Date   ABDOMINAL HYSTERECTOMY     COLONOSCOPY  2012   normal , at [facility]   KNEE SURGERY     LEG SURGERY     left lower extremity   TUBAL LIGATION     VENTRAL HERNIA REPAIR N/A 05/29/2019   Procedure: REPAIR OF VENTRAL HERNIA WITH MESH;  Surgeon: Vanderbilt Ned, MD;  Location: [facility] OR;  Service: General;  Laterality: N/A;   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   HLD (hyperlipidemia) 07/08/2016   Dyslipidemia associated with type 2 diabetes mellitus (HCC) 10/16/2013   Essential hypertension, benign 04/30/2013    PCP: Purcell Emil Schanz, MD  REFERRING PROVIDER: Jule Ronal CROME, PA-C  THERAPY DIAG:  Left shoulder pain, unspecified chronicity - Plan: PT plan of care cert/re-cert  Muscle weakness (generalized) - Plan: PT plan of care  cert/re-cert  REFERRING DIAG: Chronic left shoulder pain [M25.512, G89.29]   Rationale for Evaluation and Treatment:  Rehabilitation  SUBJECTIVE:  PERTINENT PAST HISTORY:  Htn, DM, polyneuropathy d/t DM        PRECAUTIONS: None  WEIGHT BEARING RESTRICTIONS No  FALLS:  Has patient fallen in last 6 months? Yes, Number of falls: 1x caught shoe on carpet - resulted in L shoulder injury  MOI/History of condition:  Onset date: 2 months  SUBJECTIVE STATEMENT  Megan Lane is a 68 y.o. female who presents to clinic with chief complaint of L shoulder pain and stiffness following a fall about 2 months ago.  She has had a cortisone injection which did help her pain significantly.  She was unable to lift her arm above her head prior to the injection but can no do some exercises with only slight discomfort.  She has popping in the L shoulder, but denies locking.     Pain:  Are you having pain? 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 No fracture. Small reactive joint effusion.   Insertional tendinosis of the supraspinatus and infraspinatus tendons with undersurface partial tears with interstitial extension. No full-thickness tear is appreciated. No significant fatty atrophy of the rotator cuff muscles.   Tenosynovitis of the biceps tendon with mild tendinosis of the intra-articular segment in the rotator cuff interval.  GENERAL OBSERVATION: Slight forward shoulders   UPPER EXTREMITY AROM:  ROM Right (Eval) Left (Eval)  Shoulder flexion 130 120*  Shoulder  abduction  90*  Shoulder internal rotation    Shoulder external rotation    Functional IR sacrum Sacrum *  Functional ER C7 C7  Shoulder extension    Elbow extension    Elbow flexion     (Blank rows = not tested, N = WNL, * = concordant pain with testing)  UPPER EXTREMITY MMT:  MMT Right (Eval) Left (Eval)  Shoulder flexion 11.7 6.6*  Shoulder abduction (C5) 9.6 6.3*  Shoulder ER    Shoulder IR    Middle trapezius    Lower trapezius    Shoulder extension    Grip strength    Cervical flexion (C1,C2)    Cervical S/B (C3)    Shoulder shrug (C4)    Elbow flexion (C6)    Elbow ext (C7)    Thumb ext (C8)    Finger abd (T1)    Grossly     (Blank rows = not tested, score listed is out of 5 possible points.  N = WNL, D = diminished, C = clear for gross weakness with myotome testing, * = concordant pain with testing)   UPPER EXTREMITY PROM:  PROM Right (Eval) Left (Eval)  Shoulder flexion 170 130*  Shoulder abduction    Shoulder internal rotation  n  Shoulder external rotation  n  Functional IR    Functional ER    Shoulder extension    Elbow extension    Elbow flexion     (Blank rows = not tested, N = WNL, * = concordant pain with testing)   PATIENT SURVEYS:  QuickDASH: QuickDASH Score: 13.6 / 100 = 13.6 %    TODAY'S TREATMENT:  Therapeutic Exercise: Creating, reviewing, and completing below HEP   PATIENT EDUCATION (/HM):  POC, diagnosis, prognosis, HEP, and outcome measures.  Pt educated via explanation, demonstration, and handout (HEP).  Pt confirms understanding verbally.   HOME EXERCISE PROGRAM: Access Code: [email or web address] URL: [email or web address] Date: 08/15/2023 Prepared by: Helene Gasmen  Exercises - Seated Scapular Retraction  - 1 x daily - 7 x weekly - 2 sets - 10 reps - 5 hold - Sidelying Shoulder External Rotation  - 1 x daily - 7 x weekly - 3 sets - 10 reps - Supine Shoulder Flexion Extension AAROM with Dowel  - 1 x daily - 7  x weekly - 3 sets - 10 reps  Treatment priorities   Eval        Progressive R/C and biceps strengthening        ROM - flexion/abd        Risk factors for [facility]                          ASSESSMENT:  CLINICAL IMPRESSION: Megan Lane is a 68 y.o. female who presents to clinic with signs and sxs consistent with L shoulder pain following fall about 2 months ago.  Significantly improved with  injection.  Consistent with MRI findings of tendinopathy/tenosynovitis of r/c and biceps tendon.  Pt with several risk factors for AC.  ER/IR ROM is well maintained on exam but pt does have passive flexion and abd limitation; will monitor.  Megan Lane will benefit from skilled PT to address relevant deficits and improve ability to reach to complete daily activities including her job..   OBJECTIVE IMPAIRMENTS: Pain, L shoulder strength, L shoulder ROM  ACTIVITY LIMITATIONS: lifting, reaching, working, driving  PERSONAL FACTORS: See medical history and pertinent history   REHAB POTENTIAL: Good  CLINICAL DECISION MAKING: Evolving/moderate complexity  EVALUATION COMPLEXITY: Moderate   GOALS:   SHORT TERM GOALS: Target date: 09/12/2023   [place] will be >75% HEP compliant to improve carryover between sessions and facilitate independent management of condition  Evaluation: ongoing Goal status: INITIAL   LONG TERM GOALS: Target date: 10/10/2023   Megan Lane will self report >/= 50% decrease in pain from evaluation to improve function in daily tasks  Evaluation/Baseline: 3/10 max pain Goal status: INITIAL   2.  Megan Lane will show a >/= 10 pct improvement in her QUICK DASH score (MCID is 10% or ~5 pts) as a proxy for functional improvement   Evaluation/Baseline: QuickDASH Score: 13.6 / 100 = 13.6 % Goal status: INITIAL   3.  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  Megan Lane will improve the following strength tests  to near symmetrical to show improvement in strength:  L flexion and abd   Evaluation/Baseline: see chart in note Goal status: INITIAL     PLAN: PT FREQUENCY: 1-2x/week  PT DURATION: 8 weeks  PLANNED INTERVENTIONS:  97164- PT Re-evaluation, 97110-Therapeutic exercises, 97530- Therapeutic activity, [id number]- Neuromuscular re-education, 97535- Self Care, 02859- Manual therapy, [id number]- Gait training, [id number]- Aquatic Therapy, [phone]- Electrical stimulation (manual), [id number]- Vasopneumatic device, M403810- Traction (mechanical), [id number]- Ionotophoresis 4mg /ml Dexamethasone, Taping, Dry Needling, Joint manipulation, and Spinal manipulation.   Helene Gasmen PT, DPT 08/15/2023, 10:08 AM  I just finished a MCD eval/recert.  Name: ODDIE KUHLMANN  MRN: [id number] Please request 1x/week for 8 weeks.  Check all conditions that are expected to impact treatment: Musculoskeletal disorders   I DID put a charge in.  Check all possible CPT codes: 02889- Therapeutic Exercise, [phone]- Neuro Re-education, [phone] - Gait Training, [phone] - Manual Therapy, 97530 - Therapeutic Activities, 97535 - Self Care, [phone] - Re-evaluation, M403810 - Mechanical traction, and [id number] - Aquatic therapy   Thank you!  MCD - Secure   Date of referral: 6/24 Referring provider: Ronal Grave Referring diagnosis? Chronic left shoulder pain [M25.512, G89.29]  Treatment diagnosis? (if different than referring diagnosis) Chronic left shoulder pain [M25.512, G89.29]   What was this (referring dx) caused by? Felton Hawks of Condition: Initial Onset (within last 3 months)   Laterality: Lt  Current Functional Measure Score: DASH QuickDASH Score: 13.6 / 100 = 13.6 %  Objective measurements identify impairments when they are compared to normal values, the uninvolved extremity, and prior level of function.  []  Yes  []  No  Objective assessment of functional ability: Moderate functional limitations   Briefly describe symptoms: Difficulty  with OH reaching and lifting  How did symptoms start: Fall  Average pain intensity:  Last 24 hours: 3  Past week: 3  How often does the pt experience symptoms? Frequently  How much have the symptoms interfered with usual daily activities? Moderately  How has condition  changed since care began at this facility? NA - initial visit  In general, how is the patients overall health? Fair   BACK PAIN (STarT Back Screening Tool) No

## 2023-08-15 ENCOUNTER — Encounter: Payer: Self-pay | Admitting: Physical Therapy

## 2023-08-15 ENCOUNTER — Other Ambulatory Visit: Payer: Self-pay

## 2023-08-15 ENCOUNTER — Ambulatory Visit: Attending: Physician Assistant | Admitting: Physical Therapy

## 2023-08-15 DIAGNOSIS — G8929 Other chronic pain: Secondary | ICD-10-CM | POA: Insufficient documentation

## 2023-08-15 DIAGNOSIS — R293 Abnormal posture: Secondary | ICD-10-CM | POA: Diagnosis not present

## 2023-08-15 DIAGNOSIS — M25512 Pain in left shoulder: Secondary | ICD-10-CM | POA: Insufficient documentation

## 2023-08-15 DIAGNOSIS — M6281 Muscle weakness (generalized): Secondary | ICD-10-CM | POA: Insufficient documentation

## 2023-08-16 ENCOUNTER — Encounter: Payer: Self-pay | Admitting: Radiology

## 2023-08-16 NOTE — Therapy (Signed)
 OUTPATIENT PHYSICAL THERAPY SHOULDER EVALUATION   Patient Name: Megan Lane MRN: [id number] DOB:[birth year], 68 y.o., female Today's Date: 08/21/2023   PT End of Session - 08/21/23 0851     Visit Number 2    Date for PT Re-Evaluation 10/10/23    Authorization Type MCR - UHC    Progress Note Due on Visit 10    PT Start Time [phone]    PT Stop Time 0930    PT Time Calculation (min) 38 min    Activity Tolerance Patient tolerated treatment well    Behavior During Therapy [facility] for tasks assessed/performed           Past Medical History:  Diagnosis Date   Asthma    as a child   Diabetes mellitus    Hypercholesterolemia    Hypertension    Stroke (HCC) 04/2021   TIA   Ventral hernia    Wears glasses    Past Surgical History:  Procedure Laterality Date   ABDOMINAL HYSTERECTOMY     COLONOSCOPY  2012   normal , at [facility]   KNEE SURGERY     LEG SURGERY     left lower extremity   TUBAL LIGATION     VENTRAL HERNIA REPAIR N/A 05/29/2019   Procedure: REPAIR OF VENTRAL HERNIA WITH MESH;  Surgeon: Vanderbilt Ned, MD;  Location: [facility] OR;  Service: General;  Laterality: N/A;   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   HLD (hyperlipidemia) 07/08/2016   Dyslipidemia associated with type 2 diabetes mellitus (HCC) 10/16/2013   Essential hypertension, benign 04/30/2013    PCP: Purcell Emil Schanz, MD  REFERRING PROVIDER: Jule Ronal CROME, PA-C  THERAPY DIAG:  Left shoulder pain, unspecified chronicity  Muscle weakness  (generalized)  Abnormal posture  REFERRING DIAG: Chronic left shoulder pain [M25.512, G89.29]   Rationale for Evaluation and Treatment:  Rehabilitation  SUBJECTIVE:  PERTINENT PAST HISTORY:  Htn, DM, polyneuropathy d/t DM        PRECAUTIONS: None  WEIGHT BEARING RESTRICTIONS No  FALLS:  Has patient fallen in last 6 months? Yes, Number of falls: 1x caught shoe on carpet - resulted in L shoulder injury  MOI/History of condition:  Onset date: 2 months  SUBJECTIVE STATEMENT I am feeling better since I have been doing my exercises.  Pain level today 0/10   EVAL- Megan Lane is a 68 y.o. female who presents to clinic with chief complaint of L shoulder pain and stiffness following a fall about 2 months ago.  She has had a cortisone injection which did help her pain significantly.  She was unable to lift her arm above her head prior to the injection but can no do some exercises with only slight discomfort.  She has popping in the L shoulder, but denies locking.     Pain:  Are you having pain? 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 No fracture. Small reactive joint effusion.   Insertional tendinosis of the supraspinatus and infraspinatus tendons with undersurface partial tears with interstitial extension. No full-thickness tear is appreciated. No significant fatty atrophy of the rotator cuff muscles.   Tenosynovitis of the biceps tendon with mild tendinosis of the intra-articular segment in the rotator cuff interval.  GENERAL OBSERVATION: Slight  forward shoulders   UPPER EXTREMITY AROM:  ROM Right (Eval) Left (Eval)  Shoulder flexion 130 120*  Shoulder abduction  90*  Shoulder internal rotation    Shoulder external rotation    Functional IR sacrum Sacrum *  Functional ER C7 C7  Shoulder extension    Elbow extension    Elbow flexion     (Blank rows = not tested, N = WNL, * = concordant pain with testing)  UPPER EXTREMITY MMT:  MMT Right (Eval) Left (Eval)  Shoulder flexion 11.7 6.6*  Shoulder abduction (C5) 9.6 6.3*  Shoulder ER    Shoulder IR    Middle trapezius    Lower trapezius    Shoulder extension    Grip strength    Cervical flexion (C1,C2)    Cervical S/B (C3)    Shoulder shrug (C4)    Elbow flexion (C6)    Elbow ext (C7)    Thumb ext (C8)    Finger abd (T1)    Grossly     (Blank rows = not tested, score listed is out of 5 possible points.  N = WNL, D = diminished, C = clear for gross weakness with myotome testing, * = concordant pain with testing)   UPPER EXTREMITY PROM:  PROM Right (Eval) Left (Eval)  Shoulder flexion 170 130*  Shoulder abduction    Shoulder internal rotation  n  Shoulder external rotation  n  Functional IR    Functional ER    Shoulder extension    Elbow extension    Elbow flexion     (Blank rows = not tested, N = WNL, * = concordant pain with testing)   PATIENT SURVEYS:  QuickDASH: QuickDASH Score: 13.6 / 100 = 13.6 %      PATIENT EDUCATION ([place]/HM):  POC, diagnosis, prognosis, HEP, and outcome measures.  Pt educated via explanation, demonstration, and handout (HEP).  Pt confirms understanding verbally.   HOME EXERCISE PROGRAM: Access Code: [email or web address] URL: [email or web address] Date: 08/15/2023 Prepared by: Helene Gasmen  Exercises - Seated Scapular Retraction  - 1 x daily - 7 x weekly - 2 sets - 10 reps - 5 hold - Sidelying Shoulder External Rotation  - 1 x daily - 7 x weekly - 3 sets - 10 reps - Supine Shoulder Flexion Extension AAROM  with Dowel  - 1 x daily - 7 x weekly - 3 sets - 10 reps Added 08-21-23 - Shoulder External Rotation and Scapular Retraction with Resistance  - 2 x daily - 7 x weekly - 3 sets - 10 reps - Shoulder Extension with Resistance - Palms Forward  - 1 x daily - 7 x weekly - 3 sets - 10 reps - Standing Overhead Press with Dumbbells at [employer]  - 1 x daily - 7 x weekly - 2 sets - 10 reps Treatment priorities   Eval        Progressive R/C and biceps strengthening  ROM - flexion/abd        Risk factors for [facility]                         [facility] Adult PT Treatment:                                                DATE: 08-21-23 Therapeutic Exercise: UBE 6 min   3 min forward and 3 min bkward while taking subjective Seated Scapular Retraction Bil Shoulder External Rotation with GTB 3 x 10 Standing bil Extension with GTB  3 x 10 OH press with 10 lb KB  2 x 10  no pain SELF CARE Posture and body mechanics handout and education  Lifing body mechanics and return demo  TODAY'S TREATMENT:  Therapeutic Exercise: Creating, reviewing, and completing below HEP ASSESSMENT:  CLINICAL IMPRESSION: Pt returns to clinic without pain in shoulders.  Pt reports compliance with HEP and doing well.  Pt HEP added with strengthening.  Education on posture and and body mechanics with lifting principles with return demo. Continue with POC and toward progression of goals.    EVAL -Megan Lane is a 68 y.o. female who presents to clinic with signs and sxs consistent with L shoulder pain following fall about 2 months ago.  Significantly improved with injection.  Consistent with MRI findings of tendinopathy/tenosynovitis of r/c and biceps tendon.  Pt with several risk factors for AC.  ER/IR ROM is well maintained on exam but pt does have passive flexion and abd limitation; will monitor.  Megan Lane will benefit from skilled PT to address relevant deficits and improve ability to reach to complete daily activities including her job..    OBJECTIVE IMPAIRMENTS: Pain, L shoulder strength, L shoulder ROM  ACTIVITY LIMITATIONS: lifting, reaching, working, driving  PERSONAL FACTORS: See medical history and pertinent history   REHAB POTENTIAL: Good  CLINICAL DECISION MAKING: Evolving/moderate complexity  EVALUATION COMPLEXITY: Moderate   GOALS:   SHORT TERM GOALS: Target date: 09/12/2023   M[place]isa will be >75% HEP compliant to improve carryover between sessions and facilitate independent management of condition  Evaluation: ongoing Goal status: INITIAL   LONG TERM GOALS: Target date: 10/10/2023   Megan Lane will self report >/= 50% decrease in pain from evaluation to improve function in daily tasks  Evaluation/Baseline: 3/10 max pain Goal status: INITIAL   2.  Megan Lane will show a >/= 10 pct improvement in her QUICK DASH score (MCID is 10% or ~5 pts) as a proxy for functional improvement   Evaluation/Baseline: QuickDASH Score: 13.6 / 100 = 13.6 % Goal status: INITIAL   3.  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  Megan Lane will improve the following strength tests to near symmetrical to show improvement in strength:  L flexion and abd   Evaluation/Baseline: see chart in note Goal status: INITIAL     PLAN: PT FREQUENCY: 1-2x/week  PT DURATION: 8 weeks  PLANNED INTERVENTIONS:  97164- PT Re-evaluation, 97110-Therapeutic exercises, 97530- Therapeutic activity, [id number]- Neuromuscular re-education, 97535- Self Care, 02859- Manual therapy, [id number]- Gait training, [id number]- Aquatic Therapy, [phone]- Electrical stimulation (manual), [id number]- Vasopneumatic device, [id number]- Traction (mechanical), [id number]- Ionotophoresis 4mg /ml Dexamethasone, Taping, Dry Needling, Joint manipulation, and Spinal manipulation.   Graydon Dingwall, PT, [facility] Certified  Exercise Expert for the Aging Adult  08/21/23 9:32 AM Phone: [phone] Fax:  [phone]  I just finished a MCD eval/recert.  Name: DANIELL PARADISE  MRN: [id number] Please request 1x/week for 8 weeks.  Check all conditions that are expected to impact treatment: Musculoskeletal disorders   I DID put a charge in.  Check all possible CPT codes: 02889- Therapeutic Exercise, [phone]- Neuro Re-education, [phone] - Gait Training, [phone] - Manual Therapy, 97530 - Therapeutic Activities, 97535 - Self Care, [phone] - Re-evaluation, [id number] - Mechanical traction, and [id number] - Aquatic therapy   Thank you!  MCD - Secure   Date of referral: 6/24 Referring provider: Ronal Grave Referring diagnosis? Chronic left shoulder pain [M25.512, G89.29]  Treatment diagnosis? (if different than referring diagnosis) Chronic left shoulder pain [M25.512, G89.29]   What was this (referring dx) caused by? Felton Hawks of Condition: Initial Onset (within last 3 months)   Laterality: Lt  Current Functional Measure Score: DASH QuickDASH Score: 13.6 / 100 = 13.6 %  Objective measurements identify impairments when they are compared to normal values, the uninvolved extremity, and prior level of function.  []  Yes  []  No  Objective assessment of functional ability: Moderate functional limitations   Briefly describe symptoms: Difficulty with OH reaching and lifting  How did symptoms start: Fall  Average pain intensity:  Last 24 hours: 3  Past week: 3  How often does the pt experience symptoms? Frequently  How much have the symptoms interfered with usual daily activities? Moderately  How has condition changed since care began at this facility? NA - initial visit  In general, how is the patients overall health? Fair   BACK PAIN (STarT Back Screening Tool) No

## 2023-08-21 ENCOUNTER — Encounter: Payer: Self-pay | Admitting: Physical Therapy

## 2023-08-21 ENCOUNTER — Other Ambulatory Visit (HOSPITAL_COMMUNITY): Payer: Self-pay

## 2023-08-21 ENCOUNTER — Ambulatory Visit: Admitting: Physical Therapy

## 2023-08-21 ENCOUNTER — Ambulatory Visit: Payer: BC Managed Care – PPO | Admitting: Emergency Medicine

## 2023-08-21 DIAGNOSIS — M25512 Pain in left shoulder: Secondary | ICD-10-CM

## 2023-08-21 DIAGNOSIS — G8929 Other chronic pain: Secondary | ICD-10-CM | POA: Diagnosis not present

## 2023-08-21 DIAGNOSIS — R293 Abnormal posture: Secondary | ICD-10-CM | POA: Diagnosis not present

## 2023-08-21 DIAGNOSIS — M6281 Muscle weakness (generalized): Secondary | ICD-10-CM

## 2023-08-21 NOTE — Patient Instructions (Signed)
 Sleeping on Back  Place pillow under knees. A pillow with cervical support and a roll around waist are also helpful. Copyright  VHI. All rights reserved.  Sleeping on Side Place pillow between knees. Use cervical support under neck and a roll around waist as needed. Copyright  VHI. All rights reserved.   Sleeping on Stomach   If this is the only desirable sleeping position, place pillow under lower legs, and under stomach or chest as needed.  Posture - Sitting   Sit upright, head facing forward. Try using a roll to support lower back. Keep shoulders relaxed, and avoid rounded back. Keep hips level with knees. Avoid crossing legs for long periods. Stand to Sit / Sit to Stand   To sit: Bend knees to lower self onto front edge of chair, then scoot back on seat. To stand: Reverse sequence by placing one foot forward, and scoot to front of seat. Use rocking motion to stand up.   Work Height and Reach  Ideal work height is no more than 2 to 4 inches below elbow level when standing, and at elbow level when sitting. Reaching should be limited to arm's length, with elbows slightly bent.  Bending  Bend at hips and knees, not back. Keep feet shoulder-width apart.    Posture - Standing   Good posture is important. Avoid slouching and forward head thrust. Maintain curve in low back and align ears over shoul- ders, hips over ankles.  Alternating Positions   Alternate tasks and change positions frequently to reduce fatigue and muscle tension. Take rest breaks. Computer Work   Position work to Art gallery manager. Use proper work and seat height. Keep shoulders back and down, wrists straight, and elbows at right angles. Use chair that provides full back support. Add footrest and lumbar roll as needed.  Getting Into / Out of Car  Lower self onto seat, scoot back, then bring in one leg at a time. Reverse sequence to get out.  Dressing  Lie on back to pull socks or slacks over feet, or sit  and bend leg while keeping back straight.    Housework - Sink  Place one foot on ledge of cabinet under sink when standing at sink for prolonged periods.   Pushing / Pulling  Pushing is preferable to pulling. Keep back in proper alignment, and use leg muscles to do the work.  Deep Squat   Squat and lift with both arms held against upper trunk. Tighten stomach muscles without holding breath. Use smooth movements to avoid jerking.  Avoid Twisting   Avoid twisting or bending back. Pivot around using foot movements, and bend at knees if needed when reaching for articles.  Carrying Luggage   Distribute weight evenly on both sides. Use a cart whenever possible. Do not twist trunk. Move body as a unit.   Lifting Principles Maintain proper posture and head alignment. Slide object as close as possible before lifting. Move obstacles out of the way. Test before lifting; ask for help if too heavy. Tighten stomach muscles without holding breath. Use smooth movements; do not jerk. Use legs to do the work, and pivot with feet. Distribute the work load symmetrically and close to the center of trunk. Push instead of pull whenever possible.   Ask For Help   Ask for help and delegate to others when possible. Coordinate your movements when lifting together, and maintain the low back curve.  Log Roll   Lying on back, bend left knee and place left  arm across chest. Roll all in one movement to the right. Reverse to roll to the left. Always move as one unit. Housework - Sweeping  Use long-handled equipment to avoid stooping.   Housework - Wiping  Position yourself as close as possible to reach work surface. Avoid straining your back.  Laundry - Unloading Wash   To unload small items at bottom of washer, lift leg opposite to arm being used to reach.  Gardening - Raking  Move close to area to be raked. Use arm movements to do the work. Keep back straight and avoid twisting.      Cart  When reaching into cart with one arm, lift opposite leg to keep back straight.   Getting Into / Out of Bed  Lower self to lie down on one side by raising legs and lowering head at the same time. Use arms to assist moving without twisting. Bend both knees to roll onto back if desired. To sit up, start from lying on side, and use same move-ments in reverse. Housework - Vacuuming  Hold the vacuum with arm held at side. Step back and forth to move it, keeping head up. Avoid twisting.   Laundry - Armed forces training and education officer so that bending and twisting can be avoided.   Laundry - Unloading Dryer  Squat down to reach into clothes dryer or use a reacher.  Gardening - Weeding / Psychiatric nurse or Kneel. Knee pads may be helpful.                    Posture Tips DO: - stand tall and erect - keep chin tucked in - keep head and shoulders in alignment - check posture regularly in mirror or large window - pull head back against headrest in car seat;  Change your position often.  Sit with lumbar support. DON'T: - slouch or slump while watching TV or reading - sit, stand or lie in one position  for too long;  Sitting is especially hard on the spine so if you sit at a desk/use the computer, then stand up often!   Copyright  VHI. All rights reserved.  Posture - Standing   Good posture is important. Avoid slouching and forward head thrust. Maintain curve in low back and align ears over shoul- ders, hips over ankles.  Pull your belly button in toward your back bone.   Copyright  VHI. All rights reserved.  Posture - Sitting   Sit upright, head facing forward. Try using a roll to support lower back. Keep shoulders relaxed, and avoid rounded back. Keep hips level with knees. Avoid crossing legs for long periods. Sit on Sit bones and Dont cross legs  Copyright  VHI. All rights reserved.    Megan Lane, PT, ATRIC Certified Exercise Expert for the Aging  Adult  08/21/23 9:08 AM Phone: [phone] Fax: [phone]

## 2023-08-23 ENCOUNTER — Other Ambulatory Visit (HOSPITAL_COMMUNITY): Payer: Self-pay

## 2023-08-28 ENCOUNTER — Ambulatory Visit: Admitting: Physical Therapy

## 2023-08-28 ENCOUNTER — Encounter: Payer: Self-pay | Admitting: Emergency Medicine

## 2023-08-28 ENCOUNTER — Ambulatory Visit (INDEPENDENT_AMBULATORY_CARE_PROVIDER_SITE_OTHER): Admitting: Emergency Medicine

## 2023-08-28 ENCOUNTER — Telehealth: Payer: Self-pay | Admitting: Physical Therapy

## 2023-08-28 VITALS — BP 124/80 | HR 67 | Temp 98.3°F | Ht 73.0 in | Wt 181.0 lb

## 2023-08-28 DIAGNOSIS — I152 Hypertension secondary to endocrine disorders: Secondary | ICD-10-CM | POA: Diagnosis not present

## 2023-08-28 DIAGNOSIS — E785 Hyperlipidemia, unspecified: Secondary | ICD-10-CM

## 2023-08-28 DIAGNOSIS — Z8673 Personal history of transient ischemic attack (TIA), and cerebral infarction without residual deficits: Secondary | ICD-10-CM

## 2023-08-28 DIAGNOSIS — Z7984 Long term (current) use of oral hypoglycemic drugs: Secondary | ICD-10-CM

## 2023-08-28 DIAGNOSIS — E1159 Type 2 diabetes mellitus with other circulatory complications: Secondary | ICD-10-CM | POA: Diagnosis not present

## 2023-08-28 DIAGNOSIS — Z0001 Encounter for general adult medical examination with abnormal findings: Secondary | ICD-10-CM

## 2023-08-28 DIAGNOSIS — E1169 Type 2 diabetes mellitus with other specified complication: Secondary | ICD-10-CM | POA: Diagnosis not present

## 2023-08-28 DIAGNOSIS — Z0000 Encounter for general adult medical examination without abnormal findings: Secondary | ICD-10-CM | POA: Diagnosis not present

## 2023-08-28 DIAGNOSIS — Z130 Encounter for screening for diseases of the blood and blood-forming organs and certain disorders involving the immune mechanism: Secondary | ICD-10-CM

## 2023-08-28 DIAGNOSIS — Z7985 Long-term (current) use of injectable non-insulin antidiabetic drugs: Secondary | ICD-10-CM | POA: Diagnosis not present

## 2023-08-28 LAB — COMPREHENSIVE METABOLIC PANEL WITH GFR
ALT: 19 U/L (ref 0–35)
AST: 19 U/L (ref 0–37)
Albumin: 4.2 g/dL (ref 3.5–5.2)
Alkaline Phosphatase: 102 U/L (ref 39–117)
BUN: 13 mg/dL (ref 6–23)
CO2: 32 meq/L (ref 19–32)
Calcium: 9.3 mg/dL (ref 8.4–10.5)
Chloride: 101 meq/L (ref 96–112)
Creatinine, Ser: 0.78 mg/dL (ref 0.40–1.20)
GFR: 79 mL/min (ref >60.00)
Glucose, Bld: 97 mg/dL (ref 70–99)
Potassium: 3.3 meq/L — ABNORMAL LOW (ref 3.5–5.1)
Sodium: 139 meq/L (ref 135–145)
Total Bilirubin: 0.3 mg/dL (ref 0.2–1.2)
Total Protein: 7.3 g/dL (ref 6.0–8.3)

## 2023-08-28 LAB — CBC WITH DIFFERENTIAL/PLATELET
Basophils Absolute: 0 K/uL (ref 0.0–0.1)
Basophils Relative: 0.8 % (ref 0.0–3.0)
Eosinophils Absolute: 0.2 K/uL (ref 0.0–0.7)
Eosinophils Relative: 2.7 % (ref 0.0–5.0)
HCT: 37.8 % (ref 36.0–46.0)
Hemoglobin: 12 g/dL (ref 12.0–15.0)
Lymphocytes Relative: 40.7 % (ref 12.0–46.0)
Lymphs Abs: 2.5 K/uL (ref 0.7–4.0)
MCHC: 31.8 g/dL (ref 30.0–36.0)
MCV: 81 fl (ref 78.0–100.0)
Monocytes Absolute: 0.5 K/uL (ref 0.1–1.0)
Monocytes Relative: 8.6 % (ref 3.0–12.0)
Neutro Abs: 2.9 K/uL (ref 1.4–7.7)
Neutrophils Relative %: 47.2 % (ref 43.0–77.0)
Platelets: 377 K/uL (ref 150.0–400.0)
RBC: 4.67 Mil/uL (ref 3.87–5.11)
RDW: 16.1 % — ABNORMAL HIGH (ref 11.5–15.5)
WBC: 6 K/uL (ref 4.0–10.5)

## 2023-08-28 LAB — LIPID PANEL
Cholesterol: 155 mg/dL (ref 0–200)
HDL: 59.1 mg/dL (ref >39.00)
LDL Cholesterol: 76 mg/dL (ref 0–99)
NonHDL: 95.44
Total CHOL/HDL Ratio: 3
Triglycerides: 97 mg/dL (ref 0.0–149.0)
VLDL: 19.4 mg/dL (ref 0.0–40.0)

## 2023-08-28 LAB — POCT GLYCOSYLATED HEMOGLOBIN (HGB A1C): HbA1c POC (<> result, manual entry): 5.8 % (ref 4.0–5.6)

## 2023-08-28 LAB — MICROALBUMIN / CREATININE URINE RATIO
Creatinine,U: 110.9 mg/dL
Microalb Creat Ratio: UNDETERMINED mg/g (ref 0.0–30.0)
Microalb, Ur: <0.7 mg/dL

## 2023-08-28 NOTE — Assessment & Plan Note (Signed)
Secondary stroke prevention measures discussed Importance of diabetes and hypertension control addressed Need to take statin addressed Continues daily baby aspirin

## 2023-08-28 NOTE — Addendum Note (Signed)
 Addended by: ROSALVA LEX RAMAN on: 08/28/2023 03:50 PM   Modules accepted: Orders

## 2023-08-28 NOTE — Progress Notes (Signed)
 Megan Lane 68 y.o.   Chief Complaint  Patient presents with   Annual Exam    Pt states that she has been losing a lot of weight but too much as been lost     HISTORY OF PRESENT ILLNESS: This is a 68 y.o. female here for annual exam and follow-up of chronic medical conditions including diabetes and hypertension Overall doing well. Still taking Mounjaro  and losing weight. No other complaints or medical concerns today. Lab Results  Component Value Date   HGBA1C 6.7 (A) 02/21/2023   Wt Readings from Last 3 Encounters:  08/28/23 181 lb (82.1 kg)  06/22/23 197 lb (89.4 kg)  05/31/23 197 lb (89.4 kg)     HPI   Prior to Admission medications   Medication Sig Start Date End Date Taking? Authorizing Provider  acetaminophen -codeine  (TYLENOL  #3) 300-30 MG tablet Take 1 tablet by mouth 2 (two) times daily as needed. 06/05/23  Yes Jule Ronal CROME, PA-C  aspirin  EC 81 MG tablet Take 1 tablet (81 mg total) by mouth daily. Swallow whole. 11/01/20  Yes Newlin, Enobong, MD  DULoxetine  (CYMBALTA ) 60 MG capsule Take 1 capsule (60 mg total) by mouth daily. 07/10/23  Yes Emilee Market, Emil Schanz, MD  empagliflozin  (JARDIANCE ) 25 MG TABS tablet Take 1 tablet (25 mg total) by mouth daily before breakfast. 10/30/22  Yes Graelyn Bihl, Emil Schanz, MD  gabapentin  (NEURONTIN ) 300 MG capsule Take 2 capsules (600 mg total) by mouth 3 (three) times daily. 02/21/23 02/16/24 Yes Jessy Cybulski, Emil Schanz, MD  glipiZIDE  (GLUCOTROL ) 10 MG tablet Take 1 tablet (10 mg total) by mouth 2 (two) times daily before a meal. 02/21/23  Yes Cai Flott, Emil Schanz, MD  hydrochlorothiazide  (HYDRODIURIL ) 25 MG tablet Take 1 tablet (25 mg total) by mouth daily. 05/25/22  Yes Velora Horstman, Emil Schanz, MD  meloxicam  (MOBIC ) 15 MG tablet Take 1 tablet (15 mg total) by mouth daily. 07/09/23  Yes Lola Czerwonka, Emil Schanz, MD  metoprolol  tartrate (LOPRESSOR ) 100 MG tablet Take 1 tablet (100 mg total) by mouth as directed. Take (1) tablet two hours before  your CT scan 08/14/22  Yes Jeffrie Oneil BROCKS, MD  Blood Glucose Monitoring Suppl (CONTOUR NEXT MONITOR) w/Device KIT 1 each by Does not apply route 3 (three) times daily as needed. Patient not taking: Reported on 08/28/2023 09/16/21   Newlin, Enobong, MD  cyclobenzaprine  (FLEXERIL ) 10 MG tablet Take 1 tablet (10 mg total) by mouth 3 (three) times daily as needed for muscle spasms. Patient not taking: Reported on 08/28/2023 10/30/22   Purcell Emil Schanz, MD  glucose blood (CONTOUR NEXT TEST) test strip Use as instructed 09/16/21   Newlin, Enobong, MD  HYDROcodone -acetaminophen  (NORCO/VICODIN) 5-325 MG tablet Take 1 tablet by mouth every 6 (six) hours as needed for moderate pain (pain score 4-6) or severe pain (pain score 7-10). Patient not taking: Reported on 08/28/2023 05/17/23   Rolinda Rogue, MD  Lancets (ACCU-CHEK SOFT [facility]) lancets Use as instructed Patient not taking: Reported on 08/28/2023 02/12/17   Durenda Alston SAUNDERS, FNP  methocarbamol  (ROBAXIN ) 750 MG tablet Take 1 tablet (750 mg total) by mouth 2 (two) times daily as needed for muscle spasms. Patient not taking: Reported on 08/28/2023 12/19/22   Jule Ronal CROME, PA-C  nystatin -triamcinolone  (MYCOLOG II) cream Apply 1 Application topically to corners of mouth 4 times daily. Patient not taking: Reported on 08/28/2023 01/17/23     rosuvastatin  (CRESTOR ) 20 MG tablet Take 1 tablet (20 mg total) by mouth daily. Patient not taking: Reported on  08/28/2023 10/30/22   Purcell Emil Schanz, MD  tirzepatide  (MOUNJARO ) 10 MG/0.5ML Pen Inject 10 mg into the skin once a week. Patient not taking: Reported on 08/28/2023 10/30/22   Purcell Emil Schanz, MD  traMADol  (ULTRAM ) 50 MG tablet Take 1 tablet (50 mg total) by mouth every 6 (six) hours as needed. Patient not taking: Reported on 08/28/2023 07/31/23   Jule Ronal CROME, PA-C    Allergies  Allergen Reactions   Penicillins Rash    Did it involve swelling of the face/tongue/throat, SOB, or low BP?  {[id number] Did it involve sudden or severe rash/hives, skin peeling, or any reaction on the inside of your mouth or nose? Y Did you need to seek medical attention at a hospital or doctor's office? Y When did it last happen?  Several years ago     If all above answers are "NO", may proceed with cephalosporin use.     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   HLD (hyperlipidemia) 07/08/2016   Dyslipidemia associated with type 2 diabetes mellitus (HCC) 10/16/2013   Essential hypertension, benign 04/30/2013    Past Medical History:  Diagnosis Date   Asthma    as a child   Diabetes mellitus    Hypercholesterolemia    Hypertension    Stroke (HCC) 04/2021   TIA   Ventral hernia    Wears glasses     Past Surgical History:  Procedure Laterality Date   ABDOMINAL HYSTERECTOMY     COLONOSCOPY  2012   normal , at [facility]   KNEE SURGERY     LEG SURGERY     left lower extremity   TUBAL LIGATION     VENTRAL HERNIA REPAIR N/A 05/29/2019   Procedure: REPAIR OF VENTRAL HERNIA WITH MESH;  Surgeon: Vanderbilt Ned, MD;  Location: [facility] OR;  Service: General;  Laterality: N/A;    Social History   Socioeconomic History   Marital status: Single    Spouse name: Not on file   Number of children: 2   Years of education: Not on file   Highest education level: Not on file  Occupational History   Occupation: Part time (04/13/20)  Tobacco Use   Smoking status: Former    Current packs/day: 0.00    Average packs/day: 3.0 packs/day for  35.0 years (105.0 ttl pk-yrs)    Types: Cigarettes    Start date: 08/12/1970    Quit date: 08/11/2005    Years since quitting: 18.0   Smokeless tobacco: Never   Tobacco comments:    Age 67 start  Vaping Use   Vaping status: Never Used  Substance and Sexual Activity   Alcohol use: Never   Drug use: Never   Sexual activity: Not Currently    Birth control/protection: Post-menopausal, Surgical  Other Topics Concern   Not on file  Social History Narrative   Niece lives with her   Right Handed   Drinks rarely caffeine   Social Drivers of Health   Financial Resource Strain: Low Risk  (06/22/2023)   Overall Physicist, medical Strain (  CARDIA)    Difficulty of Paying Living Expenses: Not very hard  Food Insecurity: No Food Insecurity (06/22/2023)   Hunger Vital Sign    Worried About Running Out of Food in the Last Year: Never true    Ran Out of Food in the Last Year: Never true  Transportation Needs: No Transportation Needs (06/22/2023)   PRAPARE - Administrator, Civil Service (Medical): No    Lack of Transportation (Non-Medical): No  Physical Activity: Unknown (06/22/2023)   Exercise Vital Sign    Days of Exercise per Week: 3 days    Minutes of Exercise per Session: Not on file  Stress: No Stress Concern Present (06/22/2023)   [employer] of Occupational Health - Occupational Stress Questionnaire    Feeling of Stress : Not at all  Social Connections: Moderately Integrated (06/22/2023)   Social Connection and Isolation Panel    Frequency of Communication with Friends and Family: More than three times a week    Frequency of Social Gatherings with Friends and Family: More than three times a week    Attends Religious Services: More than 4 times per year    Active Member of [employer] or Organizations: Yes    Attends Banker Meetings: 1 to 4 times per year    Marital Status: Widowed  Intimate Partner Violence: Patient Unable To Answer (06/22/2023)   Humiliation,  Afraid, Rape, and Kick questionnaire    Fear of Current or Ex-Partner: Patient unable to answer    Emotionally Abused: Patient unable to answer    Physically Abused: Patient unable to answer    Sexually Abused: Patient unable to answer    Family History  Problem Relation Age of Onset   Diabetes Mother    Hypertension Mother    Stroke Mother    Breast cancer Sister 64   Heart disease Brother    Colon cancer Neg Hx    Colon polyps Neg Hx    Esophageal cancer Neg Hx    Rectal cancer Neg Hx    Stomach cancer Neg Hx      Review of Systems  Constitutional: Negative.  Negative for chills and fever.  HENT: Negative.  Negative for congestion and sore throat.   Respiratory: Negative.  Negative for cough and shortness of breath.   Cardiovascular: Negative.  Negative for chest pain and palpitations.  Gastrointestinal:  Negative for abdominal pain, diarrhea, nausea and vomiting.  Genitourinary: Negative.  Negative for dysuria and hematuria.  Skin: Negative.  Negative for rash.  Neurological: Negative.  Negative for dizziness and headaches.  All other systems reviewed and are negative.   Vitals:   08/28/23 1437  BP: 124/80  Pulse: 67  Temp: 98.3 F (36.8 C)  SpO2: 98%    Physical Exam Vitals reviewed.  Constitutional:      Appearance: Normal appearance.  HENT:     Head: Normocephalic.     Right Ear: Tympanic membrane, ear canal and external ear normal.     Left Ear: Tympanic membrane, ear canal and external ear normal.     Mouth/Throat:     Mouth: Mucous membranes are moist.     Pharynx: Oropharynx is clear.  Eyes:     Extraocular Movements: Extraocular movements intact.     Conjunctiva/sclera: Conjunctivae normal.     Pupils: Pupils are equal, round, and reactive to light.  Cardiovascular:     Rate and Rhythm: Normal rate and regular rhythm.     Pulses: Normal pulses.  Heart sounds: Normal heart sounds.  Pulmonary:     Effort: Pulmonary effort is normal.      Breath sounds: Normal breath sounds.  Abdominal:     Palpations: Abdomen is soft.     Tenderness: There is no abdominal tenderness.  Musculoskeletal:     Cervical back: No tenderness.  Lymphadenopathy:     Cervical: No cervical adenopathy.  Skin:    General: Skin is warm and dry.     Capillary Refill: Capillary refill takes less than 2 seconds.  Neurological:     General: No focal deficit present.     Mental Status: She is alert and oriented to person, place, and time.  Psychiatric:        Mood and Affect: Mood normal.        Behavior: Behavior normal.      ASSESSMENT & PLAN: Problem List Items Addressed This Visit       Cardiovascular and Mediastinum   Hypertension associated with diabetes (HCC)   BP Readings from Last 3 Encounters:  08/28/23 124/80  05/31/23 128/72  05/17/23 (!) 148/73  Well-controlled hypertension Continue hydrochlorothiazide  25 mg daily Well-controlled diabetes with hemoglobin A1c of 5.8 Continue Jardiance  25 mg daily, glipizide  10 mg twice a day and weekly Mounjaro  10 mg Cardiovascular risks associated with diabetes and hypertension discussed Diet and nutrition discussed Follow-up in 6 months       Relevant Orders   Microalbumin / creatinine urine ratio   CBC with Differential/Platelet   Comprehensive metabolic panel with GFR   Lipid panel     Endocrine   Dyslipidemia associated with type 2 diabetes mellitus (HCC)   Chronic stable conditions Well-controlled diabetes with hemoglobin A1c of 5.8 Continue glipizide , Jardiance , and weekly Mounjaro  Continue rosuvastatin  20 mg daily Diet and nutrition discussed      Relevant Orders   Microalbumin / creatinine urine ratio   CBC with Differential/Platelet   Comprehensive metabolic panel with GFR   Lipid panel     Other   History of stroke   Secondary stroke prevention measures discussed Importance of diabetes and hypertension control addressed Need to take statin addressed Continues  daily baby aspirin       Other Visit Diagnoses       Encounter for general adult medical examination with abnormal findings    -  Primary     Screening for deficiency anemia       Relevant Orders   CBC with Differential/Platelet     Screening for endocrine, metabolic and immunity disorder       Relevant Orders   Comprehensive metabolic panel with GFR      Modifiable risk factors discussed with patient. Anticipatory guidance according to age provided. The following topics were also discussed: Social Determinants of Health Smoking.  Non-smoker Diet and nutrition Benefits of exercise Cancer screening and review of most recent colonoscopy and mammogram results Vaccinations review and recommendations Cardiovascular risk assessment and need for blood work Mental health including depression and anxiety Fall and accident prevention  Patient Instructions  Health Maintenance After Age 32 After age 68, you are at a higher risk for certain long-term diseases and infections as well as injuries from falls. Falls are a major cause of broken bones and head injuries in people who are older than age 63. Getting regular preventive care can help to keep you healthy and well. Preventive care includes getting regular testing and making lifestyle changes as recommended by your health care provider. Talk with your health  care provider about: Which screenings and tests you should have. A screening is a test that checks for a disease when you have no symptoms. A diet and exercise plan that is right for you. What should I know about screenings and tests to prevent falls? Screening and testing are the best ways to find a health problem early. Early diagnosis and treatment give you the best chance of managing medical conditions that are common after age 26. Certain conditions and lifestyle choices may make you more likely to have a fall. Your health care provider may recommend: Regular vision checks. Poor  vision and conditions such as cataracts can make you more likely to have a fall. If you wear glasses, make sure to get your prescription updated if your vision changes. Medicine review. Work with your health care provider to regularly review all of the medicines you are taking, including over-the-counter medicines. Ask your health care provider about any side effects that may make you more likely to have a fall. Tell your health care provider if any medicines that you take make you feel dizzy or sleepy. Strength and balance checks. Your health care provider may recommend certain tests to check your strength and balance while standing, walking, or changing positions. Foot health exam. Foot pain and numbness, as well as not wearing proper footwear, can make you more likely to have a fall. Screenings, including: Osteoporosis screening. Osteoporosis is a condition that causes the bones to get weaker and break more easily. Blood pressure screening. Blood pressure changes and medicines to control blood pressure can make you feel dizzy. Depression screening. You may be more likely to have a fall if you have a fear of falling, feel depressed, or feel unable to do activities that you used to do. Alcohol use screening. Using too much alcohol can affect your balance and may make you more likely to have a fall. Follow these instructions at home: Lifestyle Do not drink alcohol if: Your health care provider tells you not to drink. If you drink alcohol: Limit how much you have to: 0-1 drink a day for women. 0-2 drinks a day for men. Know how much alcohol is in your drink. In the [place] one drink equals one 12 oz bottle of beer (355 mL), one 5 oz glass of wine (148 mL), or one 1 oz glass of hard liquor (44 mL). Do not use any products that contain nicotine or tobacco. These products include cigarettes, chewing tobacco, and vaping devices, such as e-cigarettes. If you need help quitting, ask your health care  provider. Activity  Follow a regular exercise program to stay fit. This will help you maintain your balance. Ask your health care provider what types of exercise are appropriate for you. If you need a cane or walker, use it as recommended by your health care provider. Wear supportive shoes that have nonskid soles. Safety  Remove any tripping hazards, such as rugs, cords, and clutter. Install safety equipment such as grab bars in bathrooms and safety rails on stairs. Keep rooms and walkways well-lit. General instructions Talk with your health care provider about your risks for falling. Tell your health care provider if: You fall. Be sure to tell your health care provider about all falls, even ones that seem minor. You feel dizzy, tiredness (fatigue), or off-balance. Take over-the-counter and prescription medicines only as told by your health care provider. These include supplements. Eat a healthy diet and maintain a healthy weight. A healthy diet includes low-fat dairy products,  low-fat (lean) meats, and fiber from whole grains, beans, and lots of fruits and vegetables. Stay current with your vaccines. Schedule regular health, dental, and eye exams. Summary Having a healthy lifestyle and getting preventive care can help to protect your health and wellness after age 40. Screening and testing are the best way to find a health problem early and help you avoid having a fall. Early diagnosis and treatment give you the best chance for managing medical conditions that are more common for people who are older than age 22. Falls are a major cause of broken bones and head injuries in people who are older than age 12. Take precautions to prevent a fall at home. Work with your health care provider to learn what changes you can make to improve your health and wellness and to prevent falls. This information is not intended to replace advice given to you by your health care provider. Make sure you discuss  any questions you have with your health care provider. Document Revised: 06/14/2020 Document Reviewed: 06/14/2020 Elsevier Patient Education  2024 Elsevier Inc.      Emil Schaumann, MD [facility]

## 2023-08-28 NOTE — Assessment & Plan Note (Signed)
 BP Readings from Last 3 Encounters:  08/28/23 124/80  05/31/23 128/72  05/17/23 (!) 148/73  Well-controlled hypertension Continue hydrochlorothiazide  25 mg daily Well-controlled diabetes with hemoglobin A1c of 5.8 Continue Jardiance  25 mg daily, glipizide  10 mg twice a day and weekly Mounjaro  10 mg Cardiovascular risks associated with diabetes and hypertension discussed Diet and nutrition discussed Follow-up in 6 months

## 2023-08-28 NOTE — Assessment & Plan Note (Signed)
 Chronic stable conditions Well-controlled diabetes with hemoglobin A1c of 5.8 Continue glipizide , Jardiance , and weekly Mounjaro  Continue rosuvastatin  20 mg daily Diet and nutrition discussed

## 2023-08-28 NOTE — Patient Instructions (Signed)
 Health Maintenance After Age 67 After age 4, you are at a higher risk for certain long-term diseases and infections as well as injuries from falls. Falls are a major cause of broken bones and head injuries in people who are older than age 47. Getting regular preventive care can help to keep you healthy and well. Preventive care includes getting regular testing and making lifestyle changes as recommended by your health care provider. Talk with your health care provider about: Which screenings and tests you should have. A screening is a test that checks for a disease when you have no symptoms. A diet and exercise plan that is right for you. What should I know about screenings and tests to prevent falls? Screening and testing are the best ways to find a health problem early. Early diagnosis and treatment give you the best chance of managing medical conditions that are common after age 37. Certain conditions and lifestyle choices may make you more likely to have a fall. Your health care provider may recommend: Regular vision checks. Poor vision and conditions such as cataracts can make you more likely to have a fall. If you wear glasses, make sure to get your prescription updated if your vision changes. Medicine review. Work with your health care provider to regularly review all of the medicines you are taking, including over-the-counter medicines. Ask your health care provider about any side effects that may make you more likely to have a fall. Tell your health care provider if any medicines that you take make you feel dizzy or sleepy. Strength and balance checks. Your health care provider may recommend certain tests to check your strength and balance while standing, walking, or changing positions. Foot health exam. Foot pain and numbness, as well as not wearing proper footwear, can make you more likely to have a fall. Screenings, including: Osteoporosis screening. Osteoporosis is a condition that causes  the bones to get weaker and break more easily. Blood pressure screening. Blood pressure changes and medicines to control blood pressure can make you feel dizzy. Depression screening. You may be more likely to have a fall if you have a fear of falling, feel depressed, or feel unable to do activities that you used to do. Alcohol use screening. Using too much alcohol can affect your balance and may make you more likely to have a fall. Follow these instructions at home: Lifestyle Do not drink alcohol if: Your health care provider tells you not to drink. If you drink alcohol: Limit how much you have to: 0-1 drink a day for women. 0-2 drinks a day for men. Know how much alcohol is in your drink. In the [place] one drink equals one 12 oz bottle of beer (355 mL), one 5 oz glass of wine (148 mL), or one 1 oz glass of hard liquor (44 mL). Do not use any products that contain nicotine or tobacco. These products include cigarettes, chewing tobacco, and vaping devices, such as e-cigarettes. If you need help quitting, ask your health care provider. Activity  Follow a regular exercise program to stay fit. This will help you maintain your balance. Ask your health care provider what types of exercise are appropriate for you. If you need a cane or walker, use it as recommended by your health care provider. Wear supportive shoes that have nonskid soles. Safety  Remove any tripping hazards, such as rugs, cords, and clutter. Install safety equipment such as grab bars in bathrooms and safety rails on stairs. Keep rooms and walkways  well-lit. General instructions Talk with your health care provider about your risks for falling. Tell your health care provider if: You fall. Be sure to tell your health care provider about all falls, even ones that seem minor. You feel dizzy, tiredness (fatigue), or off-balance. Take over-the-counter and prescription medicines only as told by your health care provider. These include  supplements. Eat a healthy diet and maintain a healthy weight. A healthy diet includes low-fat dairy products, low-fat (lean) meats, and fiber from whole grains, beans, and lots of fruits and vegetables. Stay current with your vaccines. Schedule regular health, dental, and eye exams. Summary Having a healthy lifestyle and getting preventive care can help to protect your health and wellness after age 11. Screening and testing are the best way to find a health problem early and help you avoid having a fall. Early diagnosis and treatment give you the best chance for managing medical conditions that are more common for people who are older than age 28. Falls are a major cause of broken bones and head injuries in people who are older than age 48. Take precautions to prevent a fall at home. Work with your health care provider to learn what changes you can make to improve your health and wellness and to prevent falls. This information is not intended to replace advice given to you by your health care provider. Make sure you discuss any questions you have with your health care provider. Document Revised: 06/14/2020 Document Reviewed: 06/14/2020 Elsevier Patient Education  2024 [employer].

## 2023-08-28 NOTE — Telephone Encounter (Signed)
 NS x 1 LVM regarding missed appt and reminder of attendance policy no show and cancellations can be made without penalty 24 hours in advance of appt.   Attendance Policy   Arriving more than 15 minutes after your scheduled treatment time will result in the appointment being cancelled or rescheduled. We request that cancellations are made 24 hours ahead of your scheduled appointment. Multiple late cancellations and/or no shows may result in the cancellation of future appointments and appointments will be scheduled one at a time. Additional no shows or late cancellations will be grounds for discharge and will require a NEW REFERRAL from your provider.  Graydon Dingwall, PT, ATRIC Certified Exercise Expert for the Aging Adult  08/28/23 4:35 PM Phone: [phone] Fax: [phone]

## 2023-08-29 ENCOUNTER — Ambulatory Visit: Payer: Self-pay | Admitting: Emergency Medicine

## 2023-09-04 ENCOUNTER — Ambulatory Visit: Admitting: Physical Therapy

## 2023-09-04 ENCOUNTER — Encounter: Payer: Self-pay | Admitting: Physical Therapy

## 2023-09-04 DIAGNOSIS — R293 Abnormal posture: Secondary | ICD-10-CM

## 2023-09-04 DIAGNOSIS — M25512 Pain in left shoulder: Secondary | ICD-10-CM | POA: Diagnosis not present

## 2023-09-04 DIAGNOSIS — M6281 Muscle weakness (generalized): Secondary | ICD-10-CM

## 2023-09-04 DIAGNOSIS — G8929 Other chronic pain: Secondary | ICD-10-CM | POA: Diagnosis not present

## 2023-09-04 NOTE — Therapy (Addendum)
 PHYSICAL THERAPY UNPLANNED DISCHARGE SUMMARY   Visits from Start of Care: 3  Current functional level related to goals / functional outcomes: Current status unknown   Remaining deficits: Current status unknown   Education / Equipment: Pt has not returned since visit listed below  Patient goals were not assessed. Patient is being discharged due to not returning since the last visit.  (the note below was addended to include the above D/C summary on 01/22/2024)    Patient Name: Megan Lane MRN: [id number] DOB:1955/03/05, 68 y.o., female Today's Date: 09/04/2023   PT End of Session - 09/04/23 1545     Visit Number 3    Date for PT Re-Evaluation 10/10/23    Authorization Type MCR - UHC    Progress Note Due on Visit 10    PT Start Time 0345    PT Stop Time 0425    PT Time Calculation (min) 40 min    Activity Tolerance Patient tolerated treatment well    Behavior During Therapy WFL for tasks assessed/performed           Past Medical History:  Diagnosis Date   Asthma    as a child   Diabetes mellitus    Hypercholesterolemia    Hypertension    Stroke (HCC) 04/2021   TIA   Ventral hernia    Wears glasses    Past Surgical History:  Procedure Laterality Date   ABDOMINAL HYSTERECTOMY     COLONOSCOPY  2012   normal , at [facility]   KNEE SURGERY     LEG SURGERY     left lower extremity   TUBAL LIGATION     VENTRAL HERNIA REPAIR N/A 05/29/2019   Procedure: REPAIR OF VENTRAL HERNIA WITH MESH;  Surgeon: Vanderbilt Ned, MD;  Location: [facility] OR;  Service: General;  Laterality: N/A;   Patient Active Problem List   Diagnosis Date Noted   Acute pain of left shoulder 05/31/2023   Primary osteoarthritis involving multiple joints 04/11/2023   Left sided sciatica 12/07/2022   Chronic right shoulder pain 08/21/2022   Hypertension associated with diabetes (HCC) 12/07/2021   History of stroke 12/07/2021   Diabetic polyneuropathy associated with type 2 diabetes mellitus (HCC)  12/07/2021   Trochlear nerve disease, 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   HLD (hyperlipidemia) 07/08/2016   Dyslipidemia associated with type 2 diabetes mellitus (HCC) 10/16/2013   Essential hypertension, benign 04/30/2013    PCP: Purcell Emil Schanz, MD  REFERRING PROVIDER: Jule Ronal CROME, PA-C  THERAPY DIAG:  Left shoulder pain, unspecified chronicity  Muscle weakness (generalized)  Abnormal posture  REFERRING DIAG: Chronic left shoulder pain [M25.512, G89.29]   Rationale for Evaluation and Treatment:  Rehabilitation  SUBJECTIVE:  PERTINENT PAST HISTORY:  Htn, DM, polyneuropathy d/t DM        PRECAUTIONS: None  WEIGHT BEARING RESTRICTIONS No  FALLS:  Has patient fallen in last 6 months? Yes, Number of falls: 1x caught shoe on carpet - resulted in L shoulder injury  MOI/History of condition:  Onset date: 2 months  SUBJECTIVE STATEMENT  Pt reports that her shoulder has been feeling good for the most part.  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  EVAL- Megan Lane is a 68 y.o. female who presents to clinic with chief complaint of L shoulder pain and stiffness following a fall about 2 months ago.  She has had a cortisone injection which did help her pain significantly.  She was unable to lift  her arm above her head prior to the injection but can no do some exercises with only slight discomfort.  She has popping in the L shoulder, but denies locking.     Pain:  Are you having pain? Yes Pain location: L shoulder NPRS scale:  Average: 0/10, Worst: 3/10 (prior to injection much higher) Aggravating factors: reaching, lifting Relieving factors: rest Pain description: sharp  Occupation: Stocks at [employer] Device: na  Hand Dominance: R  Patient Goals/Specific Activities: reduce pain and regain strength   OBJECTIVE:   DIAGNOSTIC FINDINGS:   MRI  IMPRESSION: Mild AC joint arthrosis and glenohumeral joint  arthrosis. No fracture. Small reactive joint effusion.   Insertional tendinosis of the supraspinatus and infraspinatus tendons with undersurface partial tears with interstitial extension. No full-thickness tear is appreciated. No significant fatty atrophy of the rotator cuff muscles.   Tenosynovitis of the biceps tendon with mild tendinosis of the intra-articular segment in the rotator cuff interval.  GENERAL OBSERVATION: Slight forward shoulders   UPPER EXTREMITY AROM:  ROM Right (Eval) Left (Eval) L 7/29  Shoulder flexion 130 120* 127   Shoulder abduction  90* 90 degrees  Shoulder internal rotation     Shoulder external rotation     Functional IR sacrum Sacrum *   Functional ER C7 C7   Shoulder extension     Elbow extension     Elbow flexion      (Blank rows = not tested, N = WNL, * = concordant pain with testing)  UPPER EXTREMITY MMT:  MMT Right (Eval) Left (Eval)  Shoulder flexion 11.7 6.6*  Shoulder abduction (C5) 9.6 6.3*  Shoulder ER    Shoulder IR    Middle trapezius    Lower trapezius    Shoulder extension    Grip strength    Cervical flexion (C1,C2)    Cervical S/B (C3)    Shoulder shrug (C4)    Elbow flexion (C6)    Elbow ext (C7)    Thumb ext (C8)    Finger abd (T1)    Grossly     (Blank rows = not tested, score listed is out of 5 possible points.  N = WNL, D = diminished, C = clear for gross weakness with myotome testing, * = concordant pain with testing)   UPPER EXTREMITY PROM:  PROM Right (Eval) Left (Eval)  Shoulder flexion 170 130*  Shoulder abduction    Shoulder internal rotation  n  Shoulder external rotation  n  Functional IR    Functional ER    Shoulder extension    Elbow extension    Elbow flexion     (Blank rows = not tested, N = WNL, * = concordant pain with testing)   PATIENT SURVEYS:  QuickDASH: QuickDASH Score: 13.6 / 100 = 13.6 %      PATIENT EDUCATION ([place]/[facility]):  POC, diagnosis, prognosis, HEP, and outcome  measures.  Pt educated via explanation, demonstration, and handout (HEP).  Pt confirms understanding verbally.   HOME EXERCISE PROGRAM: Access Code: [email or web address] URL: [email or web address] Date: 09/04/2023 Prepared by: Helene Gasmen  Exercises - Supine Shoulder Flexion Extension AAROM with Dowel  - 1 x daily - 7 x weekly - 3 sets - 10 reps - Shoulder External Rotation and Scapular Retraction with Resistance  - 2 x daily - 7 x weekly - 3 sets - 10 reps - Shoulder External Rotation with Anchored Resistance  - 1 x daily - 7 x weekly - 3 sets - 10  reps - Standing [employer] with Dumbbells at [employer]  - 1 x daily - 7 x weekly - 2 sets - 10 reps - Standing Bicep Curls with Resistance  - 1 x daily - 7 x weekly - 3 sets - 10 reps Treatment priorities   Eval        Progressive R/C and biceps strengthening        ROM - flexion/abd        Risk factors for [facility]                         [facility] Adult PT Treatment:                                                DATE: 09/04/2023 Therapeutic Exercise: UBE 6 min   3 min forward and 3 min bkward while taking subjective Bil Shoulder External Rotation with Blue TB 3 x 10 Standing bil Extension with Black TB  3 x 10 Corner pec stretch - 45'' hold x3 Standing ER/IR with blue TB - 2x10 ea Band star pattern on wall - 2x to fatiuge  Therapeutic Activity  Towel slide Biceps curl - 6# - x10 - 9# - 2x10 OH press with 10 lb KB  3x5 no pain  TODAY'S TREATMENT:  Therapeutic Exercise: Creating, reviewing, and completing below HEP ASSESSMENT:  CLINICAL IMPRESSION: Megan Lane tolerated session well with no adverse reaction.  Progressing as expected.  Progressed R/C and periscapular strengthening.  Updated HEP.  EVAL -Megan Lane is a 68 y.o. female who presents to clinic with signs and sxs consistent with L shoulder pain following fall about 2 months ago.  Significantly improved with injection.  Consistent with MRI findings of tendinopathy/tenosynovitis of  r/c and biceps tendon.  Pt with several risk factors for AC.  ER/IR ROM is well maintained on exam but pt does have passive flexion and abd limitation; will monitor.  Megan Lane will benefit from skilled PT to address relevant deficits and improve ability to reach to complete daily activities including her job..   OBJECTIVE IMPAIRMENTS: Pain, L shoulder strength, L shoulder ROM  ACTIVITY LIMITATIONS: lifting, reaching, working, driving  PERSONAL FACTORS: See medical history and pertinent history   REHAB POTENTIAL: Good  CLINICAL DECISION MAKING: Evolving/moderate complexity  EVALUATION COMPLEXITY: Moderate   GOALS:   SHORT TERM GOALS: Target date: 09/12/2023   Morgyn will be >75% HEP compliant to improve carryover between sessions and facilitate independent management of condition  Evaluation: ongoing Goal status: INITIAL   LONG TERM GOALS: Target date: 10/10/2023   Megan Lane will self report >/= 50% decrease in pain from evaluation to improve function in daily tasks  Evaluation/Baseline: 3/10 max pain Goal status: INITIAL   2.  Megan Lane will show a >/= 10 pct improvement in her QUICK DASH score (MCID is 10% or ~5 pts) as a proxy for functional improvement   Evaluation/Baseline: QuickDASH Score: 13.6 / 100 = 13.6 % Goal status: INITIAL   3.  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  Megan Lane will improve the following strength tests to near symmetrical to show improvement in strength:  L flexion and abd   Evaluation/Baseline: see chart in note Goal status: INITIAL     PLAN: PT  FREQUENCY: 1-2x/week  PT DURATION: 8 weeks  PLANNED INTERVENTIONS:  97164- PT Re-evaluation, 97110-Therapeutic exercises, 97530- Therapeutic activity, [id number]- Neuromuscular re-education, 97535- Self Care, 02859- Manual therapy, [id number]- Gait training, [id number]- Aquatic Therapy, [phone]- Electrical stimulation  (manual), [id number]- Vasopneumatic device, [id number]- Traction (mechanical), [id number]- Ionotophoresis 4mg /ml Dexamethasone, Taping, Dry Needling, Joint manipulation, and Spinal manipulation.   Graydon Dingwall, PT, ATRIC Certified Exercise Expert for the Aging Adult  09/04/23 3:45 PM Phone: [phone] Fax: [phone]  I just finished a [facility] eval/recert.  Name: Megan Lane  MRN: [id number] Please request 1x/week for 8 weeks.  Check all conditions that are expected to impact treatment: Musculoskeletal disorders   I DID put a charge in.  Check all possible CPT codes: 02889- Therapeutic Exercise, [phone]- Neuro Re-education, [phone] - Gait Training, [phone] - Manual Therapy, 97530 - Therapeutic Activities, 97535 - Self Care, [phone] - Re-evaluation, [id number] - Mechanical traction, and [id number] - Aquatic therapy   Thank you!  [facility] - Secure   Date of referral: 6/24 Referring provider: Ronal Grave Referring diagnosis? Chronic left shoulder pain [M25.512, G89.29]  Treatment diagnosis? (if different than referring diagnosis) Chronic left shoulder pain [M25.512, G89.29]   What was this (referring dx) caused by? Felton Hawks of Condition: Initial Onset (within last 3 months)   Laterality: Lt  Current Functional Measure Score: DASH QuickDASH Score: 13.6 / 100 = 13.6 %  Objective measurements identify impairments when they are compared to normal values, the uninvolved extremity, and prior level of function.  []  Yes  []  No  Objective assessment of functional ability: Moderate functional limitations   Briefly describe symptoms: Difficulty with OH reaching and lifting  How did symptoms start: Fall  Average pain intensity:  Last 24 hours: 3  Past week: 3  How often does the pt experience symptoms? Frequently  How much have the symptoms interfered with usual daily activities? Moderately  How has condition changed since care began at this facility? NA - initial visit  In general, how is the  patients overall health? Fair   BACK PAIN (STarT Back Screening Tool) No

## 2023-09-11 ENCOUNTER — Ambulatory Visit: Admitting: Physical Therapy

## 2023-09-13 NOTE — Progress Notes (Deleted)
 Office Visit Note  Patient: Megan Lane             Date of Birth: 1956-01-08           MRN: [id number]             PCP: Purcell Emil Schanz, MD Referring: Purcell Emil Schanz, * Visit Date: 09/26/2023 Occupation: @GUAROCC @  Subjective:  No chief complaint on file.   History of Present Illness: Megan Lane is a 68 y.o. female ***   Patient was evaluated by Dr. Jerri and had MRI of the left shoulder joint which revealed rotator cuff tendinitis and partial thickness tearing and tendinitis of the proximal biceps.  She had left glenohumeral joint injection on July 31, 2023 under ultrasound guidance by Dr. Burnetta.  Activities of Daily Living:  Patient reports morning stiffness for *** {minute/hour:19697}.   Patient {ACTIONS;DENIES/REPORTS:21021675::Denies} nocturnal pain.  Difficulty dressing/grooming: {ACTIONS;DENIES/REPORTS:21021675::Denies} Difficulty climbing stairs: {ACTIONS;DENIES/REPORTS:21021675::Denies} Difficulty getting out of chair: {ACTIONS;DENIES/REPORTS:21021675::Denies} Difficulty using hands for taps, buttons, cutlery, and/or writing: {ACTIONS;DENIES/REPORTS:21021675::Denies}  No Rheumatology ROS completed.   PMFS History:  Patient Active Problem List   Diagnosis Date Noted   Acute pain of left shoulder 05/31/2023   Primary osteoarthritis involving multiple joints 04/11/2023   Left sided sciatica 12/07/2022   Chronic right shoulder pain 08/21/2022   Hypertension associated with diabetes (HCC) 12/07/2021   History of stroke 12/07/2021   Diabetic polyneuropathy associated with type 2 diabetes mellitus (HCC) 12/07/2021   Trochlear nerve disease, 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   HLD (hyperlipidemia) 07/08/2016   Dyslipidemia associated with type 2 diabetes mellitus (HCC) 10/16/2013   Essential hypertension, benign 04/30/2013    Past Medical History:  Diagnosis Date   Asthma    as a child   Diabetes mellitus    Hypercholesterolemia    Hypertension     Stroke (HCC) 04/2021   TIA   Ventral hernia    Wears glasses     Family History  Problem Relation Age of Onset   Diabetes Mother    Hypertension Mother    Stroke Mother    Breast cancer Sister 20   Heart disease Brother    Colon cancer Neg Hx    Colon polyps Neg Hx    Esophageal cancer Neg Hx    Rectal cancer Neg Hx    Stomach cancer Neg Hx    Past Surgical History:  Procedure Laterality Date   ABDOMINAL HYSTERECTOMY     COLONOSCOPY  2012   normal , [facility]   KNEE SURGERY     LEG SURGERY     left lower extremity   TUBAL LIGATION     VENTRAL HERNIA REPAIR N/A 05/29/2019   Procedure: REPAIR OF VENTRAL HERNIA WITH MESH;  Surgeon: Vanderbilt Ned, MD;  Location: [facility] OR;  Service: General;  Laterality: N/A;   Social History   Social History Narrative   Niece lives with her   Right Handed   Drinks rarely caffeine   Immunization History  Administered Date(s) Administered   Fluad Quad(high Dose 65+) 03/15/2022   Fluad Trivalent(High Dose 65+) 10/30/2022   Influenza,inj,Quad PF,6+ Mos 02/09/2015, 12/04/2016, 10/30/2018, 11/18/2019, 11/01/2020   Influenza,trivalent, recombinat, inj, PF 03/18/2013   PFIZER(Purple Top)SARS-COV-2 Vaccination 04/13/2019, 05/13/2019   Pneumococcal Polysaccharide-23 02/25/2018, 10/30/2018   Tetanus 03/18/2013   Unspecified SARS-COV-2 Vaccination 03/12/2023   Zoster Recombinant(Shingrix) 04/03/2021, 07/11/2021     Objective: Vital Signs: There were no vitals taken for this visit.   Physical Exam   Musculoskeletal Exam: ***  CDAI Exam: CDAI Score: -- Patient Global: --; Provider Global: -- Swollen: --; Tender: -- Joint Exam 09/26/2023   No joint exam has been documented for this visit   There is currently no information documented on the homunculus. Go to the Rheumatology activity and complete the homunculus joint exam.  Investigation: No additional findings.  Imaging: No results found.  Recent Labs: Lab Results  Component  Value Date   WBC 6.0 08/28/2023   HGB 12.0 08/28/2023   PLT 377.0 08/28/2023   NA 139 08/28/2023   K 3.3 (L) 08/28/2023   CL 101 08/28/2023   CO2 32 08/28/2023   GLUCOSE 97 08/28/2023   BUN 13 08/28/2023   CREATININE 0.78 08/28/2023   BILITOT 0.3 08/28/2023   ALKPHOS 102 08/28/2023   AST 19 08/28/2023   ALT 19 08/28/2023   PROT 7.3 08/28/2023   ALBUMIN 4.2 08/28/2023   CALCIUM  9.3 08/28/2023   GFRAA 64 08/18/2019    IMPRESSION: Mild AC joint arthrosis and glenohumeral joint arthrosis. No fracture. Small reactive joint effusion. Insertional tendinosis of the supraspinatus and infraspinatus tendons with undersurface partial tears with interstitial extension. No full-thickness tear is appreciated. No significant fatty atrophy of the rotator cuff muscles. Tenosynovitis of the biceps tendon with mild tendinosis of the intra-articular segment in the rotator cuff interval. Electronically signed by: Norleen Satchel MD 06/22/2023   Speciality Comments: No specialty comments available.  Procedures:  No procedures performed Allergies: Penicillins   Assessment / Plan:     Visit Diagnoses: Primary osteoarthritis involving multiple joints  Hypertension associated with diabetes (HCC)  Dyslipidemia associated with type 2 diabetes mellitus (HCC)  Diabetic polyneuropathy associated with type 2 diabetes mellitus (HCC)  History of stroke  Trochlear nerve disease, right  Orders: No orders of the defined types were placed in this encounter.  No orders of the defined types were placed in this encounter.   Face-to-face time spent with patient was *** minutes. Greater than 50% of time was spent in counseling and coordination of care.  Follow-Up Instructions: No follow-ups on file.   Maya Nash, MD  Note - This record has been created using Animal nutritionist.  Chart creation errors have been sought, but may not always  have been located. Such creation errors do not reflect on  the  standard of medical care.

## 2023-09-17 ENCOUNTER — Other Ambulatory Visit: Payer: Self-pay | Admitting: Cardiology

## 2023-09-17 ENCOUNTER — Other Ambulatory Visit (HOSPITAL_COMMUNITY): Payer: Self-pay

## 2023-09-17 ENCOUNTER — Other Ambulatory Visit: Payer: Self-pay | Admitting: Emergency Medicine

## 2023-09-17 DIAGNOSIS — E1169 Type 2 diabetes mellitus with other specified complication: Secondary | ICD-10-CM

## 2023-09-17 DIAGNOSIS — E1140 Type 2 diabetes mellitus with diabetic neuropathy, unspecified: Secondary | ICD-10-CM

## 2023-09-17 MED ORDER — DULOXETINE HCL 60 MG PO CPEP
60.0000 mg | ORAL_CAPSULE | Freq: Every day | ORAL | 0 refills | Status: DC
  Filled 2023-09-17 – 2023-09-26 (×2): qty 90, 90d supply, fill #0

## 2023-09-19 ENCOUNTER — Other Ambulatory Visit (HOSPITAL_COMMUNITY): Payer: Self-pay

## 2023-09-20 ENCOUNTER — Ambulatory Visit: Attending: Physician Assistant | Admitting: Physical Therapy

## 2023-09-24 ENCOUNTER — Other Ambulatory Visit (HOSPITAL_COMMUNITY): Payer: Self-pay

## 2023-09-26 ENCOUNTER — Encounter: Admitting: Rheumatology

## 2023-09-26 DIAGNOSIS — M150 Primary generalized (osteo)arthritis: Secondary | ICD-10-CM

## 2023-09-26 DIAGNOSIS — E1169 Type 2 diabetes mellitus with other specified complication: Secondary | ICD-10-CM

## 2023-09-26 DIAGNOSIS — E1159 Type 2 diabetes mellitus with other circulatory complications: Secondary | ICD-10-CM

## 2023-09-26 DIAGNOSIS — Z8673 Personal history of transient ischemic attack (TIA), and cerebral infarction without residual deficits: Secondary | ICD-10-CM

## 2023-09-26 DIAGNOSIS — H4911 Fourth [trochlear] nerve palsy, right eye: Secondary | ICD-10-CM

## 2023-09-26 DIAGNOSIS — E1142 Type 2 diabetes mellitus with diabetic polyneuropathy: Secondary | ICD-10-CM

## 2023-09-27 ENCOUNTER — Other Ambulatory Visit (HOSPITAL_COMMUNITY): Payer: Self-pay

## 2023-10-26 ENCOUNTER — Other Ambulatory Visit: Payer: Self-pay | Admitting: Emergency Medicine

## 2023-10-26 DIAGNOSIS — M150 Primary generalized (osteo)arthritis: Secondary | ICD-10-CM

## 2023-10-26 DIAGNOSIS — I10 Essential (primary) hypertension: Secondary | ICD-10-CM

## 2023-10-26 DIAGNOSIS — M79641 Pain in right hand: Secondary | ICD-10-CM

## 2023-10-26 NOTE — Telephone Encounter (Unsigned)
 Copied from CRM [id number]. Topic: Clinical - Medication Refill >> Oct 26, 2023 11:55 AM [name] wrote: Medication: hydrochlorothiazide  (HYDRODIURIL ) 25 MG tablet, meloxicam  (MOBIC ) 15 MG tablet   Has the patient contacted their pharmacy? Yes (Agent: If no, request that the patient contact the pharmacy for the refill. If patient does not wish to contact the pharmacy document the reason why and proceed with request.) (Agent: If yes, when and what did the pharmacy advise?)  This is the patient's preferred pharmacy:  [facility] [address], [address] [place] [address] Phone: [phone] Fax: [phone]   Is this the correct pharmacy for this prescription? Yes If no, delete pharmacy and type the correct one.   Has the prescription been filled recently? No  Is the patient out of the medication? Yes  Has the patient been seen for an appointment in the last year OR does the patient have an upcoming appointment? Yes  Can we respond through MyChart? Yes  Agent: Please be advised that Rx refills may take up to 3 business days. We ask that you follow-up with your pharmacy.

## 2023-10-30 ENCOUNTER — Other Ambulatory Visit: Payer: Self-pay | Admitting: Emergency Medicine

## 2023-10-30 ENCOUNTER — Other Ambulatory Visit: Payer: Self-pay

## 2023-10-30 ENCOUNTER — Encounter (HOSPITAL_COMMUNITY): Payer: Self-pay

## 2023-10-30 ENCOUNTER — Other Ambulatory Visit (HOSPITAL_COMMUNITY): Payer: Self-pay

## 2023-10-30 ENCOUNTER — Ambulatory Visit: Admitting: Rheumatology

## 2023-10-30 ENCOUNTER — Other Ambulatory Visit: Payer: Self-pay | Admitting: Physician Assistant

## 2023-10-30 DIAGNOSIS — M150 Primary generalized (osteo)arthritis: Secondary | ICD-10-CM

## 2023-10-30 DIAGNOSIS — M79641 Pain in right hand: Secondary | ICD-10-CM

## 2023-10-30 DIAGNOSIS — M62838 Other muscle spasm: Secondary | ICD-10-CM

## 2023-10-30 DIAGNOSIS — E1159 Type 2 diabetes mellitus with other circulatory complications: Secondary | ICD-10-CM

## 2023-10-30 MED ORDER — METHOCARBAMOL 750 MG PO TABS
750.0000 mg | ORAL_TABLET | Freq: Two times a day (BID) | ORAL | 2 refills | Status: AC | PRN
  Filled 2023-10-30: qty 20, 10d supply, fill #0
  Filled 2024-01-09: qty 20, 10d supply, fill #1

## 2023-10-30 MED ORDER — MOUNJARO 10 MG/0.5ML ~~LOC~~ SOAJ
10.0000 mg | SUBCUTANEOUS | 3 refills | Status: DC
  Filled 2023-10-30 – 2023-11-07 (×2): qty 6, 84d supply, fill #0
  Filled 2023-11-26: qty 2, 28d supply, fill #0
  Filled 2023-12-14 – 2024-01-09 (×3): qty 2, 28d supply, fill #1

## 2023-10-31 ENCOUNTER — Other Ambulatory Visit (HOSPITAL_COMMUNITY): Payer: Self-pay

## 2023-10-31 ENCOUNTER — Other Ambulatory Visit: Payer: Self-pay

## 2023-11-01 ENCOUNTER — Other Ambulatory Visit (HOSPITAL_COMMUNITY): Payer: Self-pay

## 2023-11-02 ENCOUNTER — Other Ambulatory Visit: Payer: Self-pay | Admitting: Emergency Medicine

## 2023-11-02 ENCOUNTER — Other Ambulatory Visit (HOSPITAL_COMMUNITY): Payer: Self-pay

## 2023-11-02 DIAGNOSIS — E1169 Type 2 diabetes mellitus with other specified complication: Secondary | ICD-10-CM

## 2023-11-02 MED ORDER — HYDROCHLOROTHIAZIDE 25 MG PO TABS
25.0000 mg | ORAL_TABLET | Freq: Every day | ORAL | 3 refills | Status: AC
  Filled 2023-11-02: qty 90, 90d supply, fill #0
  Filled 2023-12-26 – 2024-01-09 (×2): qty 90, 90d supply, fill #1

## 2023-11-02 MED ORDER — ROSUVASTATIN CALCIUM 20 MG PO TABS
20.0000 mg | ORAL_TABLET | Freq: Every day | ORAL | 3 refills | Status: AC
  Filled 2023-11-02 – 2024-01-09 (×3): qty 90, 90d supply, fill #0

## 2023-11-02 MED ORDER — MELOXICAM 15 MG PO TABS
15.0000 mg | ORAL_TABLET | Freq: Every day | ORAL | 1 refills | Status: AC
  Filled 2023-11-02: qty 30, 30d supply, fill #0
  Filled 2023-12-14 – 2024-01-09 (×2): qty 30, 30d supply, fill #1

## 2023-11-05 ENCOUNTER — Other Ambulatory Visit (HOSPITAL_COMMUNITY): Payer: Self-pay

## 2023-11-05 ENCOUNTER — Other Ambulatory Visit: Payer: Self-pay | Admitting: Emergency Medicine

## 2023-11-05 ENCOUNTER — Other Ambulatory Visit: Payer: Self-pay

## 2023-11-05 DIAGNOSIS — E1169 Type 2 diabetes mellitus with other specified complication: Secondary | ICD-10-CM

## 2023-11-05 MED ORDER — EMPAGLIFLOZIN 25 MG PO TABS
25.0000 mg | ORAL_TABLET | Freq: Every day | ORAL | 3 refills | Status: AC
  Filled 2023-11-05: qty 90, 90d supply, fill #0
  Filled 2023-11-26: qty 30, 30d supply, fill #0
  Filled 2023-12-26 – 2024-01-09 (×2): qty 30, 30d supply, fill #1

## 2023-11-07 ENCOUNTER — Other Ambulatory Visit (HOSPITAL_COMMUNITY): Payer: Self-pay

## 2023-11-14 ENCOUNTER — Other Ambulatory Visit (HOSPITAL_COMMUNITY): Payer: Self-pay

## 2023-11-16 ENCOUNTER — Other Ambulatory Visit (HOSPITAL_COMMUNITY): Payer: Self-pay

## 2023-11-26 ENCOUNTER — Other Ambulatory Visit (HOSPITAL_COMMUNITY): Payer: Self-pay

## 2023-12-10 ENCOUNTER — Encounter: Payer: Self-pay | Admitting: Radiology

## 2023-12-14 ENCOUNTER — Other Ambulatory Visit: Payer: Self-pay | Admitting: Emergency Medicine

## 2023-12-14 ENCOUNTER — Other Ambulatory Visit (HOSPITAL_COMMUNITY): Payer: Self-pay

## 2023-12-14 DIAGNOSIS — E1140 Type 2 diabetes mellitus with diabetic neuropathy, unspecified: Secondary | ICD-10-CM

## 2023-12-14 MED ORDER — DULOXETINE HCL 60 MG PO CPEP
60.0000 mg | ORAL_CAPSULE | Freq: Every day | ORAL | 0 refills | Status: AC
  Filled 2023-12-14 – 2024-01-09 (×2): qty 90, 90d supply, fill #0

## 2023-12-15 ENCOUNTER — Other Ambulatory Visit (HOSPITAL_COMMUNITY): Payer: Self-pay

## 2023-12-17 ENCOUNTER — Other Ambulatory Visit: Payer: Self-pay

## 2023-12-17 ENCOUNTER — Other Ambulatory Visit (HOSPITAL_COMMUNITY): Payer: Self-pay

## 2023-12-26 ENCOUNTER — Other Ambulatory Visit (HOSPITAL_COMMUNITY): Payer: Self-pay

## 2023-12-27 ENCOUNTER — Other Ambulatory Visit (HOSPITAL_COMMUNITY): Payer: Self-pay

## 2024-01-07 ENCOUNTER — Other Ambulatory Visit (HOSPITAL_COMMUNITY): Payer: Self-pay

## 2024-01-09 ENCOUNTER — Other Ambulatory Visit (HOSPITAL_COMMUNITY): Payer: Self-pay

## 2024-01-11 ENCOUNTER — Other Ambulatory Visit: Payer: Self-pay

## 2024-01-11 ENCOUNTER — Ambulatory Visit: Payer: Self-pay

## 2024-01-11 ENCOUNTER — Observation Stay (HOSPITAL_BASED_OUTPATIENT_CLINIC_OR_DEPARTMENT_OTHER)
Admission: EM | Admit: 2024-01-11 | Discharge: 2024-01-13 | Disposition: A | Attending: Internal Medicine | Admitting: Internal Medicine

## 2024-01-11 DIAGNOSIS — I152 Hypertension secondary to endocrine disorders: Secondary | ICD-10-CM | POA: Diagnosis present

## 2024-01-11 DIAGNOSIS — Z8673 Personal history of transient ischemic attack (TIA), and cerebral infarction without residual deficits: Secondary | ICD-10-CM

## 2024-01-11 DIAGNOSIS — G8929 Other chronic pain: Secondary | ICD-10-CM | POA: Diagnosis present

## 2024-01-11 DIAGNOSIS — E162 Hypoglycemia, unspecified: Principal | ICD-10-CM | POA: Diagnosis present

## 2024-01-11 DIAGNOSIS — E11649 Type 2 diabetes mellitus with hypoglycemia without coma: Secondary | ICD-10-CM | POA: Diagnosis present

## 2024-01-11 DIAGNOSIS — R42 Dizziness and giddiness: Secondary | ICD-10-CM

## 2024-01-11 DIAGNOSIS — E785 Hyperlipidemia, unspecified: Secondary | ICD-10-CM | POA: Diagnosis present

## 2024-01-11 DIAGNOSIS — E1159 Type 2 diabetes mellitus with other circulatory complications: Secondary | ICD-10-CM | POA: Diagnosis present

## 2024-01-11 LAB — CBC
HCT: 41.4 % (ref 36.0–46.0)
Hemoglobin: 13.7 g/dL (ref 12.0–15.0)
MCH: 27.2 pg (ref 26.0–34.0)
MCHC: 33.1 g/dL (ref 30.0–36.0)
MCV: 82.1 fL (ref 80.0–100.0)
Platelets: 382 K/uL (ref 150–400)
RBC: 5.04 MIL/uL (ref 3.87–5.11)
RDW: 14.5 % (ref 11.5–15.5)
WBC: 7.4 K/uL (ref 4.0–10.5)
nRBC: 0 % (ref 0.0–0.2)

## 2024-01-11 LAB — BASIC METABOLIC PANEL WITH GFR
Anion gap: 15 (ref 5–15)
BUN: 10 mg/dL (ref 8–23)
CO2: 26 mmol/L (ref 22–32)
Calcium: 9.9 mg/dL (ref 8.9–10.3)
Chloride: 101 mmol/L (ref 98–111)
Creatinine, Ser: 0.72 mg/dL (ref 0.44–1.00)
GFR, Estimated: >60 mL/min (ref >60)
Glucose, Bld: 53 mg/dL — ABNORMAL LOW (ref 70–99)
Potassium: 3.4 mmol/L — ABNORMAL LOW (ref 3.5–5.1)
Sodium: 142 mmol/L (ref 135–145)

## 2024-01-11 LAB — CBG MONITORING, ED
Glucose-Capillary: 197 mg/dL — ABNORMAL HIGH (ref 70–99)
Glucose-Capillary: 51 mg/dL — ABNORMAL LOW (ref 70–99)
Glucose-Capillary: 59 mg/dL — ABNORMAL LOW (ref 70–99)

## 2024-01-11 MED ORDER — DEXTROSE 50 % IV SOLN
25.0000 g | Freq: Once | INTRAVENOUS | Status: AC
  Administered 2024-01-11: 25 g via INTRAVENOUS
  Filled 2024-01-11: qty 50

## 2024-01-11 MED ORDER — DEXTROSE 50 % IV SOLN
25.0000 g | Freq: Once | INTRAVENOUS | Status: AC
  Administered 2024-01-12: 25 g via INTRAVENOUS
  Filled 2024-01-11: qty 50

## 2024-01-11 NOTE — Telephone Encounter (Addendum)
 FYI Only or Action Required?: FYI only for provider: appointment scheduled on 01/14/24.  Patient was last seen in primary care on 08/28/2023 by Purcell Emil Schanz, MD.  Called Nurse Triage reporting Blood Sugar Problem.  Symptoms began today.  Interventions attempted: Other: 120ml Coca Cola.  Symptoms are: rapidly improving.  Triage Disposition: Call PCP Within 24 Hours, Urgent Home Treatment With Follow-up Call  Patient/caregiver understands and will follow disposition?: Yes    Returned call to patient. Her blood sugar has improved to 107 after treating with coca cola. She will continue to monitor her blood sugar today and call back or seek urgent evaluation if her sugar continues to go low or if she developed symptoms. Follow up appointment scheduled with pcp on 01/14/24.    Copied from CRM [id number]. Topic: Clinical - Red Word Triage >> Jan 11, 2024  2:57 PM Burnard DEL wrote: Red Word that prompted transfer to Nurse Triage: blood sugar 50 Reason for Disposition  [1] Low blood sugar symptoms with no other adult present AND [2] hasn't tried Care Advice  Answer Assessment - Initial Assessment Questions Plan: patient will treat hypoglycemia with regular coca cola 120 ml, recheck sugar in 15 minutes. This RN will call patient back at 320pm to follow up and reassess, patient aware to call back if unable to connect at 320. Patient aware of ED precautions.    1. SYMPTOMS: What symptoms are you concerned about?     Lightheaded slightly this mornign 2. ONSET:  When did the symptoms start?     today 3. BLOOD GLUCOSE: What is your blood glucose level?      When she woke up 55, current 51 4. USUAL RANGE: What is your blood glucose level usually? (e.g., usual fasting morning value, usual evening value)      5. TYPE 1 or 2:  Do you know what type of diabetes you have?  (e.g., Type 1, Type 2, Gestational; doesn't know)       6. INSULIN : Do you take insulin ? What type of insulin (s)  do you use? What is the mode of delivery? (syringe, pen; injection or pump) When did you last give yourself an insulin  dose? (i.e., time or hours/minutes ago) How much did you give? (i.e., how many units)      7. DIABETES PILLS: Do you take any pills for your diabetes? If Yes, ask: What is the name of the medicine(s) that you take for high blood sugar?     yes 8. OTHER SYMPTOMS: Do you have any symptoms? (e.g., fever, frequent urination, difficulty breathing, vomiting)     no 9. LOW BLOOD GLUCOSE TREATMENT: What have you done so far to treat the low blood glucose level?     Nothing for this reading 10. FOOD: When did you last eat or drink?       Ate breakfast after morning sugar check 11. ALONE: Are you alone right now or is someone with you?        No-niece is with patient.  12. PREGNANCY: Is there any chance you are pregnant? When was your last menstrual period?  Protocols used: Diabetes - Low Blood Sugar-A-AH

## 2024-01-11 NOTE — ED Triage Notes (Signed)
 Pt POV reporting weakness r/t hypoglycemia, 50s, drank soda at home with improvement and then dropped again, 51 in triage, juice given.

## 2024-01-11 NOTE — Telephone Encounter (Signed)
 Reason for Disposition . [1] Blood glucose 70 mg/dL (3.9 mmol/L) or below OR symptomatic, now improved with Care Advice AND [2] cause unknown  Protocols used: Diabetes - Low Blood Sugar-A-AH

## 2024-01-11 NOTE — Discharge Instructions (Addendum)
 Hold your diabetic medicines until you see your doctor.  Make she have someone stay with you over the weekend.

## 2024-01-11 NOTE — ED Notes (Signed)
CBG 197.

## 2024-01-11 NOTE — ED Provider Notes (Signed)
 [place] EMERGENCY DEPARTMENT AT [facility] Provider Note   CSN: [id number] Arrival date & time: 01/11/24  2105     Patient presents with: Hypoglycemia   Megan Lane is a 68 y.o. female.   Patient presents with general weakness and hypoglycemia the 50s.  Patient started feeling weak and checked her sugar and realized it was low so drink soda at home.  Improved then dropped again shortly after arrival.  Patient tolerating juice no vomiting.  Patient has been on the weight loss medication and has lost significant weight over the last 2 months approxi-70 pounds.  Patient has not had a change in her medications and has been on glipizide  twice a day 10 mg.  Patient has someone to stay with her.  Patient feeling much better and normal at this time.  Patient been eating regular meals.  No seizures.  Patient denies insulin  use.  The history is provided by the patient.  Hypoglycemia Associated symptoms: weakness   Associated symptoms: no shortness of breath and no vomiting        Prior to Admission medications   Medication Sig Start Date End Date Taking? Authorizing Provider  acetaminophen -codeine  (TYLENOL  #3) 300-30 MG tablet Take 1 tablet by mouth 2 (two) times daily as needed. 06/05/23   Jule Ronal CROME, PA-C  aspirin  EC 81 MG tablet Take 1 tablet (81 mg total) by mouth daily. Swallow whole. 11/01/20   Newlin, Enobong, MD  Blood Glucose Monitoring Suppl (CONTOUR NEXT MONITOR) w/Device KIT 1 each by Does not apply route 3 (three) times daily as needed. Patient not taking: Reported on 08/28/2023 09/16/21   Newlin, Enobong, MD  cyclobenzaprine  (FLEXERIL ) 10 MG tablet Take 1 tablet (10 mg total) by mouth 3 (three) times daily as needed for muscle spasms. Patient not taking: Reported on 08/28/2023 10/30/22   Purcell Emil Schanz, MD  DULoxetine  (CYMBALTA ) 60 MG capsule Take 1 capsule (60 mg total) by mouth daily. 12/14/23   Purcell Emil Schanz, MD  empagliflozin  (JARDIANCE ) 25 MG  TABS tablet Take 1 tablet (25 mg total) by mouth daily before breakfast. 11/05/23   Sagardia, Miguel Jose, MD  gabapentin  (NEURONTIN ) 300 MG capsule Take 2 capsules (600 mg total) by mouth 3 (three) times daily. 02/21/23 02/16/24  Purcell Emil Schanz, MD  glipiZIDE  (GLUCOTROL ) 10 MG tablet Take 1 tablet (10 mg total) by mouth 2 (two) times daily before a meal. 02/21/23   Sagardia, Emil Schanz, MD  glucose blood (CONTOUR NEXT TEST) test strip Use as instructed 09/16/21   Newlin, Enobong, MD  hydrochlorothiazide  (HYDRODIURIL ) 25 MG tablet Take 1 tablet (25 mg total) by mouth daily. 11/02/23   Purcell Emil Schanz, MD  HYDROcodone -acetaminophen  (NORCO/VICODIN) 5-325 MG tablet Take 1 tablet by mouth every 6 (six) hours as needed for moderate pain (pain score 4-6) or severe pain (pain score 7-10). Patient not taking: Reported on 08/28/2023 05/17/23   Rolinda Rogue, MD  Lancets (ACCU-CHEK SOFT [facility]) lancets Use as instructed Patient not taking: Reported on 08/28/2023 02/12/17   Durenda Alston SAUNDERS, FNP  meloxicam  (MOBIC ) 15 MG tablet Take 1 tablet (15 mg total) by mouth daily. 11/02/23   Purcell Emil Schanz, MD  methocarbamol  (ROBAXIN ) 750 MG tablet Take 1 tablet (750 mg total) by mouth 2 (two) times daily as needed for muscle spasms. 10/30/23   Jule Ronal CROME, PA-C  metoprolol  tartrate (LOPRESSOR ) 100 MG tablet Take 1 tablet (100 mg total) by mouth as directed. Take (1) tablet two hours before your CT  scan 08/14/22   Jeffrie Oneil BROCKS, MD  nystatin -triamcinolone  (MYCOLOG II) cream Apply 1 Application topically to corners of mouth 4 times daily. Patient not taking: Reported on 08/28/2023 01/17/23     rosuvastatin  (CRESTOR ) 20 MG tablet Take 1 tablet (20 mg total) by mouth daily. 11/02/23   Sagardia, Miguel Jose, MD  tirzepatide  (MOUNJARO ) 10 MG/0.5ML Pen Inject 10 mg into the skin once a week. 10/30/23   Sagardia, Miguel Jose, MD  traMADol  (ULTRAM ) 50 MG tablet Take 1 tablet (50 mg total) by mouth every 6 (six) hours  as needed. Patient not taking: Reported on 08/28/2023 07/31/23   Jule Ronal CROME, PA-C    Allergies: Penicillins    Review of Systems  Constitutional:  Negative for chills and fever.  HENT:  Negative for congestion.   Eyes:  Negative for visual disturbance.  Respiratory:  Negative for shortness of breath.   Cardiovascular:  Negative for chest pain.  Gastrointestinal:  Negative for abdominal pain and vomiting.  Genitourinary:  Negative for dysuria and flank pain.  Musculoskeletal:  Negative for back pain, neck pain and neck stiffness.  Skin:  Negative for rash.  Neurological:  Positive for weakness. Negative for light-headedness and headaches.    Updated Vital Signs BP 116/69   Pulse 96   Temp 97.9 F (36.6 C) (Oral)   Resp 17   SpO2 100%   Physical Exam Vitals and nursing note reviewed.  Constitutional:      General: She is not in acute distress.    Appearance: She is well-developed.  HENT:     Head: Normocephalic and atraumatic.     Mouth/Throat:     Mouth: Mucous membranes are moist.  Eyes:     General:        Right eye: No discharge.        Left eye: No discharge.     Conjunctiva/sclera: Conjunctivae normal.  Neck:     Trachea: No tracheal deviation.  Cardiovascular:     Rate and Rhythm: Normal rate and regular rhythm.     Heart sounds: No murmur heard. Pulmonary:     Effort: Pulmonary effort is normal.     Breath sounds: Normal breath sounds.  Abdominal:     General: There is no distension.     Palpations: Abdomen is soft.     Tenderness: There is no abdominal tenderness. There is no guarding.  Musculoskeletal:     Cervical back: Normal range of motion and neck supple. No rigidity.  Skin:    General: Skin is warm.     Capillary Refill: Capillary refill takes less than 2 seconds.     Findings: No rash.  Neurological:     General: No focal deficit present.     Mental Status: She is alert.     Cranial Nerves: No cranial nerve deficit.     Comments: On  reassessment when glucose low patient generally tired no focal neurodeficits.  Psychiatric:        Mood and Affect: Mood normal.     (all labs ordered are listed, but only abnormal results are displayed) Labs Reviewed  BASIC METABOLIC PANEL WITH GFR - Abnormal; Notable for the following components:      Result Value   Potassium 3.4 (*)    Glucose, Bld 53 (*)    All other components within normal limits  CBG MONITORING, ED - Abnormal; Notable for the following components:   Glucose-Capillary 51 (*)    All other components within normal  limits  CBG MONITORING, ED - Abnormal; Notable for the following components:   Glucose-Capillary 197 (*)    All other components within normal limits  CBG MONITORING, ED - Abnormal; Notable for the following components:   Glucose-Capillary 59 (*)    All other components within normal limits  CBC  CBG MONITORING, ED    EKG: None  Radiology: No results found.   .Critical Care  Performed by: Tonia Chew, MD Authorized by: Tonia Chew, MD   Critical care provider statement:    Critical care time (minutes):  30   Critical care start time:  01/11/2024 10:00 PM   Critical care end time:  01/11/2024 10:30 PM   Critical care time was exclusive of:  Separately billable procedures and treating other patients and teaching time   Critical care was necessary to treat or prevent imminent or life-threatening deterioration of the following conditions:  Metabolic crisis   Critical care was time spent personally by me on the following activities:  Ordering and performing treatments and interventions, pulse oximetry and re-evaluation of patient's condition    Medications Ordered in the ED  dextrose  50 % solution 25 g (has no administration in time range)  dextrose  50 % solution 25 g (25 g Intravenous Given 01/11/24 2125)                                    Medical Decision Making Amount and/or Complexity of Data Reviewed Labs:  ordered.  Risk Prescription drug management.   Patient presents with feeling generally weak and recurrent hypoglycemia.  Patient is on oral diabetic medications last dose glipizide  at 2 PM.  Patient symptoms resolved after oral glucose beverage.  Likely cause due to significant weight loss/weight loss medication and patient still on her normal diabetic medications.  Patient observed in the ER and meal given.  Patient Toller meal without difficulty.  Point-of-care glucose rechecked shortly after however unfortunately returned to 50s.  Patient did develop symptoms with this.  General blood work independent reviewed mild hypokalemia 3.4.  51 197 glucose 51 then 197 then 59 on repeats.  D50 ordered.  Paged hospitalist for observation ideally to [facility] long.  Patient refusing to be transported to [facility].     Final diagnoses:  Hypoglycemia  Dizziness    ED Discharge Orders     None          Tonia Chew, MD 01/11/24 2353

## 2024-01-12 DIAGNOSIS — E11649 Type 2 diabetes mellitus with hypoglycemia without coma: Secondary | ICD-10-CM | POA: Diagnosis not present

## 2024-01-12 DIAGNOSIS — E162 Hypoglycemia, unspecified: Principal | ICD-10-CM | POA: Diagnosis present

## 2024-01-12 LAB — CBG MONITORING, ED
Glucose-Capillary: 46 mg/dL — ABNORMAL LOW (ref 70–99)
Glucose-Capillary: 72 mg/dL (ref 70–99)

## 2024-01-12 LAB — GLUCOSE, CAPILLARY
Glucose-Capillary: 69 mg/dL — ABNORMAL LOW (ref 70–99)
Glucose-Capillary: 89 mg/dL (ref 70–99)
Glucose-Capillary: 145 mg/dL — ABNORMAL HIGH (ref 70–99)
Glucose-Capillary: 106 mg/dL — ABNORMAL HIGH (ref 70–99)
Glucose-Capillary: 108 mg/dL — ABNORMAL HIGH (ref 70–99)

## 2024-01-12 LAB — BASIC METABOLIC PANEL WITH GFR
Anion gap: 9 (ref 5–15)
BUN: 8 mg/dL (ref 8–23)
CO2: 30 mmol/L (ref 22–32)
Calcium: 9.5 mg/dL (ref 8.9–10.3)
Chloride: 100 mmol/L (ref 98–111)
Creatinine, Ser: 0.74 mg/dL (ref 0.44–1.00)
GFR, Estimated: >60 mL/min (ref >60)
Glucose, Bld: 97 mg/dL (ref 70–99)
Potassium: 3.4 mmol/L — ABNORMAL LOW (ref 3.5–5.1)
Sodium: 139 mmol/L (ref 135–145)

## 2024-01-12 LAB — MAGNESIUM: Magnesium: 2.3 mg/dL (ref 1.7–2.4)

## 2024-01-12 MED ORDER — POTASSIUM CHLORIDE CRYS ER 20 MEQ PO TBCR
40.0000 meq | EXTENDED_RELEASE_TABLET | Freq: Once | ORAL | Status: AC
  Administered 2024-01-12: 40 meq via ORAL
  Filled 2024-01-12: qty 2

## 2024-01-12 MED ORDER — METHOCARBAMOL 500 MG PO TABS: 750.0000 mg | ORAL_TABLET | Freq: Two times a day (BID) | ORAL | Status: DC | PRN

## 2024-01-12 MED ORDER — DEXTROSE 10 % IV SOLN: INTRAVENOUS | Status: DC

## 2024-01-12 MED ORDER — OCTREOTIDE ACETATE 50 MCG/ML IJ SOLN: 50.0000 ug | Freq: Two times a day (BID) | INTRAMUSCULAR | Status: DC | PRN

## 2024-01-12 MED ORDER — DEXTROSE-SODIUM CHLORIDE 5-0.9 % IV SOLN: Freq: Once | INTRAVENOUS | Status: AC

## 2024-01-12 MED ORDER — HYDROCODONE-ACETAMINOPHEN 5-325 MG PO TABS
1.0000 | ORAL_TABLET | Freq: Four times a day (QID) | ORAL | Status: DC | PRN
  Administered 2024-01-12: 1 via ORAL
  Filled 2024-01-12: qty 1

## 2024-01-12 MED ORDER — GABAPENTIN 300 MG PO CAPS
600.0000 mg | ORAL_CAPSULE | Freq: Three times a day (TID) | ORAL | Status: DC
  Administered 2024-01-12 – 2024-01-13 (×4): 600 mg via ORAL
  Filled 2024-01-12 (×4): qty 2

## 2024-01-12 MED ORDER — METOPROLOL TARTRATE 50 MG PO TABS
50.0000 mg | ORAL_TABLET | Freq: Two times a day (BID) | ORAL | Status: DC
  Administered 2024-01-13: 50 mg via ORAL
  Filled 2024-01-12: qty 1

## 2024-01-12 MED ORDER — ENOXAPARIN SODIUM 40 MG/0.4ML IJ SOSY
40.0000 mg | PREFILLED_SYRINGE | INTRAMUSCULAR | Status: DC
  Administered 2024-01-12 – 2024-01-13 (×2): 40 mg via SUBCUTANEOUS
  Filled 2024-01-12 (×2): qty 0.4

## 2024-01-12 MED ORDER — ACETAMINOPHEN 650 MG RE SUPP: 650.0000 mg | Freq: Four times a day (QID) | RECTAL | Status: DC | PRN

## 2024-01-12 MED ORDER — ACETAMINOPHEN 325 MG PO TABS
650.0000 mg | ORAL_TABLET | Freq: Four times a day (QID) | ORAL | Status: DC | PRN
  Administered 2024-01-13: 650 mg via ORAL
  Filled 2024-01-12: qty 2

## 2024-01-12 MED ORDER — METOPROLOL TARTRATE 50 MG PO TABS
100.0000 mg | ORAL_TABLET | Freq: Every day | ORAL | Status: DC
  Administered 2024-01-12: 100 mg via ORAL
  Filled 2024-01-12: qty 2

## 2024-01-12 MED ORDER — ASPIRIN 81 MG PO TBEC
81.0000 mg | DELAYED_RELEASE_TABLET | Freq: Every day | ORAL | Status: DC
  Administered 2024-01-12 – 2024-01-13 (×2): 81 mg via ORAL
  Filled 2024-01-12 (×2): qty 1

## 2024-01-12 MED ORDER — ONDANSETRON HCL 4 MG PO TABS: 4.0000 mg | ORAL_TABLET | Freq: Four times a day (QID) | ORAL | Status: DC | PRN

## 2024-01-12 MED ORDER — ONDANSETRON HCL 4 MG/2ML IJ SOLN: 4.0000 mg | Freq: Four times a day (QID) | INTRAMUSCULAR | Status: DC | PRN

## 2024-01-12 MED ORDER — DULOXETINE HCL 60 MG PO CPEP
60.0000 mg | ORAL_CAPSULE | Freq: Every day | ORAL | Status: DC
  Administered 2024-01-12 – 2024-01-13 (×2): 60 mg via ORAL
  Filled 2024-01-12 (×2): qty 1

## 2024-01-12 MED ORDER — ROSUVASTATIN CALCIUM 20 MG PO TABS
20.0000 mg | ORAL_TABLET | Freq: Every day | ORAL | Status: DC
  Administered 2024-01-12 – 2024-01-13 (×2): 20 mg via ORAL
  Filled 2024-01-12 (×2): qty 1

## 2024-01-12 MED ORDER — INSULIN ASPART 100 UNIT/ML IJ SOLN
0.0000 [IU] | Freq: Three times a day (TID) | INTRAMUSCULAR | Status: DC
  Administered 2024-01-13 (×2): 1 [IU] via SUBCUTANEOUS
  Filled 2024-01-12 (×2): qty 1

## 2024-01-12 NOTE — H&P (Signed)
 History and Physical    [name] FMW:[id number] DOB: Dec 31, 1955 DOA: 01/11/2024  PCP: Purcell Emil Schanz, MD Patient coming from: Home  Chief Complaint: hypoglycemia  HPI: Megan Lane is a 68 y.o. female with medical history significant of diabetes type 2, hypertension, hyperlipidemia, CVA and asthma who presents to the hospital with complaint of low glucose level.  Patient states that she has been on Mounjaro , Jardiance  and glipizide  for about a year at this time.  States that she was attempting to lose weight along with her provider she was initially 240 pounds and is now down to 171.  That she last took Mounjaro  on Friday and she has been eating regularly.  Her glucose level at that time was 120.  Over the last week she said that her appetite really has not been changing and she eats more meats and vegetables however yesterday her glucose level was in the 50.  States that she drank apple juice and took a number of other measures to increase her glucose level.  However, each time she took her glucose level will go up to 17 and dropped dropped back down into the 50s. Said she started to feel a little lightheaded and therefore presented to the emergency room for further evaluation.  ED Course:  In the ER, BP 124/86, HR 79, RR 16, O2 saturation 100% on RA,  and Tmax 98.1. Cbc demonstrated wbc,  hb/hct, and platelet. Chemistry demonstrated Na 142, K 3.4, Cl 101, bicarb 26, Bun/Cr 10/0.72 and glucose 53.  Review of Systems:  All systems reviewed and apart from history of presenting illness, are negative.  Past Medical History:  Diagnosis Date   Asthma    as a child   Diabetes mellitus    Hypercholesterolemia    Hypertension    Stroke (HCC) 04/2021   TIA   Ventral hernia    Wears glasses     Past Surgical History:  Procedure Laterality Date   ABDOMINAL HYSTERECTOMY     COLONOSCOPY  2012   normal , at [facility]   KNEE SURGERY     LEG SURGERY     left lower extremity    TUBAL LIGATION     VENTRAL HERNIA REPAIR N/A 05/29/2019   Procedure: REPAIR OF VENTRAL HERNIA WITH MESH;  Surgeon: Vanderbilt Ned, MD;  Location: [facility] OR;  Service: General;  Laterality: N/A;     reports that she quit smoking about 18 years ago. Her smoking use included cigarettes. She started smoking about 53 years ago. She has a 105 pack-year smoking history. She has never used smokeless tobacco. She reports that she does not drink alcohol and does not use drugs.  Allergies  Allergen Reactions   Penicillins Rash    Did it involve swelling of the face/tongue/throat, SOB, or low BP? {[id number] Did it involve sudden or severe rash/hives, skin peeling, or any reaction on the inside of your mouth or nose? Y Did you need to seek medical attention at a hospital or doctor's office? Y When did it last happen?  Several years ago     If all above answers are "NO", may proceed with cephalosporin use.     Family History  Problem Relation Age of Onset   Diabetes Mother    Hypertension Mother    Stroke Mother    Breast cancer Sister 14   Heart disease Brother    Colon cancer Neg Hx    Colon polyps Neg Hx    Esophageal cancer Neg  Hx    Rectal cancer Neg Hx    Stomach cancer Neg Hx     Prior to Admission medications   Medication Sig Start Date End Date Taking? Authorizing Provider  acetaminophen -codeine  (TYLENOL  #3) 300-30 MG tablet Take 1 tablet by mouth 2 (two) times daily as needed. 06/05/23   Jule Ronal CROME, PA-C  aspirin  EC 81 MG tablet Take 1 tablet (81 mg total) by mouth daily. Swallow whole. 11/01/20   Newlin, Enobong, MD  Blood Glucose Monitoring Suppl (CONTOUR NEXT MONITOR) w/Device KIT 1 each by Does not apply route 3 (three) times daily as needed. Patient not taking: Reported on 08/28/2023 09/16/21   Newlin, Enobong, MD  cyclobenzaprine  (FLEXERIL ) 10 MG tablet Take 1 tablet (10 mg total) by mouth 3 (three) times daily as needed for muscle spasms. Patient not taking: Reported on  08/28/2023 10/30/22   Purcell Emil Schanz, MD  DULoxetine  (CYMBALTA ) 60 MG capsule Take 1 capsule (60 mg total) by mouth daily. 12/14/23   Purcell Emil Schanz, MD  empagliflozin  (JARDIANCE ) 25 MG TABS tablet Take 1 tablet (25 mg total) by mouth daily before breakfast. 11/05/23   Sagardia, Miguel Jose, MD  gabapentin  (NEURONTIN ) 300 MG capsule Take 2 capsules (600 mg total) by mouth 3 (three) times daily. 02/21/23 02/16/24  Purcell Emil Schanz, MD  glipiZIDE  (GLUCOTROL ) 10 MG tablet Take 1 tablet (10 mg total) by mouth 2 (two) times daily before a meal. 02/21/23   Sagardia, Emil Schanz, MD  glucose blood (CONTOUR NEXT TEST) test strip Use as instructed 09/16/21   Delbert Clam, MD  hydrochlorothiazide  (HYDRODIURIL ) 25 MG tablet Take 1 tablet (25 mg total) by mouth daily. 11/02/23   Purcell Emil Schanz, MD  HYDROcodone -acetaminophen  (NORCO/VICODIN) 5-325 MG tablet Take 1 tablet by mouth every 6 (six) hours as needed for moderate pain (pain score 4-6) or severe pain (pain score 7-10). Patient not taking: Reported on 08/28/2023 05/17/23   Rolinda Rogue, MD  Lancets (ACCU-CHEK SOFT [facility]) lancets Use as instructed Patient not taking: Reported on 08/28/2023 02/12/17   Durenda Alston SAUNDERS, FNP  meloxicam  (MOBIC ) 15 MG tablet Take 1 tablet (15 mg total) by mouth daily. 11/02/23   Purcell Emil Schanz, MD  methocarbamol  (ROBAXIN ) 750 MG tablet Take 1 tablet (750 mg total) by mouth 2 (two) times daily as needed for muscle spasms. 10/30/23   Jule Ronal CROME, PA-C  metoprolol  tartrate (LOPRESSOR ) 100 MG tablet Take 1 tablet (100 mg total) by mouth as directed. Take (1) tablet two hours before your CT scan 08/14/22   Jeffrie Oneil BROCKS, MD  nystatin -triamcinolone  (MYCOLOG II) cream Apply 1 Application topically to corners of mouth 4 times daily. Patient not taking: Reported on 08/28/2023 01/17/23     rosuvastatin  (CRESTOR ) 20 MG tablet Take 1 tablet (20 mg total) by mouth daily. 11/02/23   Sagardia, Miguel Jose, MD   tirzepatide  (MOUNJARO ) 10 MG/0.5ML Pen Inject 10 mg into the skin once a week. 10/30/23   Sagardia, Miguel Jose, MD  traMADol  (ULTRAM ) 50 MG tablet Take 1 tablet (50 mg total) by mouth every 6 (six) hours as needed. Patient not taking: Reported on 08/28/2023 07/31/23   Jule Ronal CROME, PA-C    Physical Exam: Vitals:   01/12/24 0300 01/12/24 0344 01/12/24 0400 01/12/24 0448  BP: (!) 146/92  125/75 124/86  Pulse: 83   79  Resp: 18  19 16   Temp:  98.1 F (36.7 C)  97.9 F (36.6 C)  TempSrc:  Oral    SpO2:  98%   100%    Physical Exam Constitutional:      General: He is not in acute distress.    Appearance: Normal appearance.  HENT:     Head: Normocephalic and atraumatic.  Eyes:     Extraocular Movements: Extraocular movements intact.     Conjunctiva/sclera: Conjunctivae normal.     Pupils: Pupils are equal, round, and reactive to light.  Cardiovascular:     Rate and Rhythm: Normal rate and regular rhythm.     Pulses: Normal pulses.     Heart sounds: Normal heart sounds.  Pulmonary:     Effort: Pulmonary effort is normal. No respiratory distress.     Breath sounds: Normal breath sounds. No wheezing, rhonchi or rales.  Abdominal:     General: Abdomen is flat. Bowel sounds are normal. There is no distension.     Palpations: Abdomen is soft.     Tenderness: There is no abdominal tenderness.  Musculoskeletal:        General: No deformity. Normal range of motion.  Skin:    General: Skin is warm and dry.     Coloration: Skin is not jaundiced.  Neurological:     General: No focal deficit present.     Mental Status: He is alert and oriented to person, place, and time. Mental status is at baseline.   Labs on Admission: I have personally reviewed following labs and imaging studies  CBC: Recent Labs  Lab 01/11/24 2132  WBC 7.4  HGB 13.7  HCT 41.4  MCV 82.1  PLT 382   Basic Metabolic Panel: Recent Labs  Lab 01/11/24 2132  NA 142  K 3.4*  CL 101  CO2 26  GLUCOSE 53*   BUN 10  CREATININE 0.72  CALCIUM  9.9   GFR: CrCl cannot be calculated (Unknown ideal weight.). Liver Function Tests: No results for input(s): AST, ALT, ALKPHOS, BILITOT, PROT, ALBUMIN in the last 168 hours. No results for input(s): LIPASE, AMYLASE in the last 168 hours. No results for input(s): AMMONIA in the last 168 hours. Coagulation Profile: No results for input(s): INR, PROTIME in the last 168 hours. Cardiac Enzymes: No results for input(s): CKTOTAL, CKMB, CKMBINDEX, TROPONINI in the last 168 hours. BNP (last 3 results) No results for input(s): PROBNP in the last 8760 hours. HbA1C: No results for input(s): HGBA1C in the last 72 hours. CBG: Recent Labs  Lab 01/11/24 2134 01/11/24 2334 01/12/24 0122 01/12/24 0333 01/12/24 0450  GLUCAP 197* 59* 46* 72 69*   Lipid Profile: No results for input(s): CHOL, HDL, LDLCALC, TRIG, CHOLHDL, LDLDIRECT in the last 72 hours. Thyroid  Function Tests: No results for input(s): TSH, T4TOTAL, FREET4, T3FREE, THYROIDAB in the last 72 hours. Anemia Panel: No results for input(s): VITAMINB12, FOLATE, FERRITIN, TIBC, IRON, RETICCTPCT in the last 72 hours. Urine analysis:    Component Value Date/Time   COLORURINE STRAW (A) 01/23/2020 0644   APPEARANCEUR CLEAR 01/23/2020 0644   LABSPEC 1.012 01/23/2020 0644   PHURINE 5.0 01/23/2020 0644   GLUCOSEU >=500 (A) 01/23/2020 0644   HGBUR NEGATIVE 01/23/2020 0644   BILIRUBINUR negative 04/23/2020 1350   BILIRUBINUR small 08/20/2017 1528   KETONESUR negative 04/23/2020 1350   KETONESUR NEGATIVE 01/23/2020 0644   PROTEINUR NEGATIVE 01/23/2020 0644   UROBILINOGEN 0.2 04/23/2020 1350   UROBILINOGEN 0.2 08/31/2012 1150   NITRITE Negative 04/23/2020 1350   NITRITE NEGATIVE 01/23/2020 0644   LEUKOCYTESUR Negative 04/23/2020 1350   LEUKOCYTESUR NEGATIVE 01/23/2020 0644    Radiological Exams on Admission:  No results found. EKG:  Independently reviewed.   Assessment/Plan Principal Problem:   Hypoglycemia Active Problems:   Hypoglycemia associated with diabetes (HCC)   Hypoglycemia associated with diabetes type 2 Patient was on glipizide , jardiance  and mounjaro  as well  Will hold at this time  Will start on dextrose  infusion at this time Apparently she has eaten in the ER and was given at least two doses of dextrose  Patient's last reading she remained hypoglycemic at that time Will start on octreotide  50 mg SQ every 12 hours Appears that was started on Mounjaro  10 mg q. weekly  Hypertension Blood pressure appears to be borderline at this time  Metoprolol  100 mg daily,  will be restarted  Was on hydrochlorothiazide  25 mg daily as well will hold  Hyperlipidemia She was taking Crestor  20 mg daily Continue at this time  Diabetic polyneuropathy Is on Neurontin  300 mg 3 times daily Will continue Hold Flexeril   Continue Cymbalta   Chronic right shoulder pain Osteoarthritis Patient will have the above for chronic pain   DVT prophylaxis:  lovenox   Code Status: full Family Communication:   Disposition Plan: Status is: inpatient  The patient will require care spanning > 2 midnights and should be moved to inpatient because:    Consults called: none Admission status: inpatient  Level of care: Level of care: Telemetry The medical decision making on this patient was of high complexity and the patient is at high risk for clinical deterioration, therefore this is a level 3 visit.  The medical decision making is of moderate complexity, therefore this is a level 2 visit.  Bradly MARLA Drones MD [facility]  If 7PM-7AM, please contact night-coverage [email or web address]  01/12/2024, 5:14 AM

## 2024-01-12 NOTE — Progress Notes (Signed)
 Progress Note   Patient: Megan Lane FMW:[id number] DOB: 1955-02-11 DOA: 01/11/2024     1 DOS: the patient was seen and examined on 01/12/2024   Brief hospital course: 68yo with h/o T2DM, HTN, HLD, CVA, and asthma who presented on 12/5 with hypoglycemia.  She has been treated with Mounjaro , Jardiance , and glipizide  and has lost from 240 to 171 pounds.  Hypoglycemia is likely related to glipizide  and this medication needs to be stopped.   Assessment & Plan Hypoglycemia associated with diabetes (HCC) Patient was on glipizide , jardiance  and mounjaro  Improved, suspect that hypoglycemia was caused by glipizide  as this is a known risk Stop glipizide  No longer taking Mounjaro  OK to resume Jardiance  Monitor blood sugars at home for the next 24-48 hours Feels better and wants to go home Continue gabapentin  Hypertension associated with diabetes (HCC) Blood pressure appears to be borderline at this time  Metoprolol  100 mg daily,  but this is short-acting medication and is indicated BID; resume tomorrow at 50 mg BID Was on hydrochlorothiazide  25 mg daily; hold until follow up HLD (hyperlipidemia) Continue rosuvastatin  History of stroke Continue ASA Chronic right shoulder pain Continue duloxetine  and methocarbamol       Consultants: TOC team  Procedures: None  Antibiotics: None  30 Day Unplanned Readmission Risk Score    Flowsheet Row ED to Hosp-Admission (Current) from 01/11/2024 in [place] 4TH FLOOR PROGRESSIVE CARE AND UROLOGY  30 Day Unplanned Readmission Risk Score (%) 8.3 Filed at 01/12/2024 1201    This score is the patient's risk of an unplanned readmission within 30 days of being discharged (0 -100%). The score is based on dignosis, age, lab data, medications, orders, and past utilization.   Low:  0-14.9   Medium: 15-21.9   High: 22-29.9   Extreme: 30 and above           Subjective: Feeling ok.  No specific concerns.  Feels ok to    Objective: Vitals:    01/12/24 1204 01/12/24 1400  BP: 97/63 101/62  Pulse: (!) 57 (!) 59  Resp: 18 19  Temp: 98 F (36.7 C) 98.5 F (36.9 C)  SpO2: 98% 97%    Intake/Output Summary (Last 24 hours) at 01/12/2024 1741 Last data filed at 01/12/2024 1651 Gross per 24 hour  Intake 815.99 ml  Output --  Net 815.99 ml   There were no vitals filed for this visit.  Exam:  General:  Appears calm and comfortable and is in NAD Eyes:  normal lids, iris ENT:  grossly normal hearing, lips & tongue, mmm Cardiovascular:  RRR. No LE edema.  Respiratory:   CTA bilaterally with no wheezes/rales/rhonchi.  Normal respiratory effort. Abdomen:  soft, NT, ND Skin:  no rash or induration seen on limited exam Musculoskeletal:  grossly normal tone BUE/BLE, good ROM, no bony abnormality Psychiatric:  blunted mood and affect, speech fluent and appropriate, AOx3 Neurologic:  CN 2-12 grossly intact, moves all extremities in coordinated fashion  Data Reviewed: I have reviewed the patient's lab results since admission.  Pertinent labs for today include:   Glucose 53, 197, 59, 46, 72, 69, 89, 97, 145 K+ 3.4        Family Communication: None present      Code Status: Full Code    Disposition: Status is: Observation The patient remains OBS appropriate and will d/c before 2 midnights.     Time spent: 50 minutes  Unresulted Labs (From admission, onward)     Start  Ordered   01/12/24 1742  Basic metabolic panel  Once,   R       Question:  Specimen collection method  Answer:  Lab=Lab collect   01/12/24 1741             Author: Delon Herald, MD 01/12/2024 5:41 PM  For on call review [email or web address].

## 2024-01-12 NOTE — Assessment & Plan Note (Signed)
 Continue ASA

## 2024-01-12 NOTE — Progress Notes (Signed)
 Plan of Care Note for accepted transfer   Patient: Megan Lane MRN: [id number]   DOA: 01/11/2024  Facility requesting transfer: [facility]   Requesting Provider: Dr. Zavitz   Reason for transfer: Hypoglycemia   Facility course: 67 yr old female with HTN, HLD, DM, and CVA presents with hypoglycemia.   She reports intentional loss of ~70 lbs in the past couple months, has continued 10 mg glipizide  twice daily, and developed hypoglycemia today. Sugar came up with cola and juice, but drops again. She was fed in the ED and also treated twice with IV dextrose    Plan of care: The patient is accepted for admission to Telemetry unit, at [facility].   Author: Evalene GORMAN Sprinkles, MD 01/12/2024  Check [email or web address] for on-call coverage.  Nursing staff, Please call [facility] Admits & Consults System-Wide number on Amion as soon as patient's arrival, so appropriate admitting provider can evaluate the pt.

## 2024-01-12 NOTE — Assessment & Plan Note (Signed)
 Continue rosuvastatin.

## 2024-01-12 NOTE — ED Notes (Signed)
 [facility] in ED preparing pt for transfer

## 2024-01-12 NOTE — Care Management CC44 (Signed)
 Condition Code 44 Documentation Completed  Patient Details  Name: BRONWEN PENDERGRAFT MRN: [id number] Date of Birth: 02/01/1956   Condition Code 44 given:  Yes Patient signature on Condition Code 44 notice:  Yes Documentation of 2 MD's agreement:  Yes Code 44 added to claim:  Yes    Sonda Manuella Quill, RN 01/12/2024, 2:08 PM

## 2024-01-12 NOTE — Discharge Summary (Signed)
 Physician Discharge Summary   Patient: Megan Lane MRN: [id number] DOB: [birth year]  Admit date:     01/11/2024  Discharge date: 01/13/2024  Discharge Physician: Delon Herald   PCP: Purcell Emil Schanz, MD   Recommendations at discharge:   Stop glipizide  Monitor glucose every 4 hours for the next 24-48 hours and eat regularly Start metoprolol  back tomorrow at 50 mg twice daily Hold hydrochlorothiazide  for now Follow up with Dr. Delynn next week  Discharge Diagnoses: Principal Problem:   Hypoglycemia associated with diabetes (HCC) Active Problems:   HLD (hyperlipidemia)   Hypertension associated with diabetes (HCC)   History of stroke   Chronic right shoulder pain    Hospital Course: 68yo with h/o T2DM, HTN, HLD, CVA, and asthma who presented on 12/5 with hypoglycemia.  She has been treated with Mounjaro , Jardiance , and glipizide  and has lost from 240 to 171 pounds.  Hypoglycemia is likely related to glipizide  and this medication needs to be stopped.  Assessment and Plan:  Assessment & Plan Hypoglycemia associated with diabetes (HCC) Patient was on glipizide , jardiance  and mounjaro  Improved, suspect that hypoglycemia was caused by glipizide  as this is a known risk Stop glipizide  No longer taking Mounjaro  OK to resume Jardiance  Monitor blood sugars at home for the next 24-48 hours Feels better and wants to go home Continue gabapentin  Hypertension associated with diabetes (HCC) Blood pressure appears to be borderline at this time  Metoprolol  100 mg daily,  but this is short-acting medication and is indicated BID; resume tomorrow at 50 mg BID Was on hydrochlorothiazide  25 mg daily; hold until follow up HLD (hyperlipidemia) Continue rosuvstatin History of stroke Continue ASA Chronic right shoulder pain Continue methocarbamol , duloxetine       Consultants: TOC team   Procedures: None   Antibiotics: None   Pain control - [place]  Controlled  Substance Reporting System database was reviewed. and patient was instructed, not to drive, operate heavy machinery, perform activities at heights, swimming or participation in water activities or provide baby-sitting services while on Pain, Sleep and Anxiety Medications; until their outpatient Physician has advised to do so again. Also recommended to not to take more than prescribed Pain, Sleep and Anxiety Medications.   Disposition: Home Diet recommendation:  Carb modified diet DISCHARGE MEDICATION: Allergies as of 01/13/2024       Reactions   Penicillins Rash        Medication List     PAUSE taking these medications    hydrochlorothiazide  25 MG tablet Wait to take this until your doctor or other care provider tells you to start again. Commonly known as: HYDRODIURIL  Take 1 tablet (25 mg total) by mouth daily.       STOP taking these medications    accu-chek soft touch lancets   acetaminophen -codeine  300-30 MG tablet Commonly known as: TYLENOL  #3   HYDROcodone -acetaminophen  5-325 MG tablet Commonly known as: NORCO/VICODIN       TAKE these medications    acetaminophen  325 MG tablet Commonly known as: TYLENOL  Take 2 tablets (650 mg total) by mouth every 6 (six) hours as needed for mild pain (pain score 1-3) or fever (or Fever >/= 101).   aspirin  EC 81 MG tablet Take 1 tablet (81 mg total) by mouth daily. Swallow whole.   Contour Next Test test strip Generic drug: glucose blood Use as instructed   DULoxetine  60 MG capsule Commonly known as: CYMBALTA  Take 1 capsule (60 mg total) by mouth daily.   gabapentin  300 MG capsule Commonly known  as: NEURONTIN  Take 2 capsules (600 mg total) by mouth 3 (three) times daily. What changed:  how much to take when to take this   Jardiance  25 MG Tabs tablet Generic drug: empagliflozin  Take 1 tablet (25 mg total) by mouth daily before breakfast.   meloxicam  15 MG tablet Commonly known as: MOBIC  Take 1 tablet (15 mg  total) by mouth daily. What changed:  when to take this reasons to take this   methocarbamol  750 MG tablet Commonly known as: ROBAXIN  Take 1 tablet (750 mg total) by mouth 2 (two) times daily as needed for muscle spasms.   metoprolol  tartrate 50 MG tablet Commonly known as: LOPRESSOR  Take 1 tablet (50 mg total) by mouth 2 (two) times daily.   rosuvastatin  20 MG tablet Commonly known as: CRESTOR  Take 1 tablet (20 mg total) by mouth daily.        Follow-up Information     Schedule an appointment as soon as possible for a visit  with Sagardia, Miguel Jose, MD.   Specialty: Internal Medicine Contact information: [address] [place] [address] [phone]                Discharge Exam:    Subjective: Feeling okay today, ready to go home.  No concerns.   Objective: Vitals:   01/12/24 2044 01/13/24 0511  BP: 112/75 130/88  Pulse: 63 70  Resp: 19 18  Temp: 98 F (36.7 C) 98 F (36.7 C)  SpO2: 98% 100%    Intake/Output Summary (Last 24 hours) at 01/13/2024 1207 Last data filed at 01/13/2024 1100 Gross per 24 hour  Intake 915.74 ml  Output --  Net 915.74 ml   Filed Weights   01/13/24 0541  Weight: 79.9 kg    Exam:  General:  Appears calm and comfortable and is in NAD Eyes:  normal lids, iris ENT:  grossly normal hearing, lips & tongue, mmm Cardiovascular:  RRR. No LE edema.  Respiratory:   CTA bilaterally with no wheezes/rales/rhonchi.  Normal respiratory effort. Abdomen:  soft, NT, ND Skin:  no rash or induration seen on limited exam Musculoskeletal:  grossly normal tone BUE/BLE, good ROM, no bony abnormality Psychiatric:  grossly normal mood and affect, speech fluent and appropriate, AOx3 Neurologic:  CN 2-12 grossly intact, moves all extremities in coordinated fashion, sensation intact  Data Reviewed: I have reviewed the patient's lab results since admission.  Pertinent labs for today include:   Unremarkable BMP Glucose 145,  106, 108, 135, 146, 128    Condition at discharge: stable  The results of significant diagnostics from this hospitalization (including imaging, microbiology, ancillary and laboratory) are listed below for reference.   Imaging Studies: No results found.  Microbiology: Results for orders placed or performed during the hospital encounter of 01/22/20  Resp Panel by RT-PCR (Flu A&B, Covid) Nasopharyngeal Swab     Status: None   Collection Time: 01/22/20 11:44 AM   Specimen: Nasopharyngeal Swab; Nasopharyngeal(NP) swabs in vial transport medium  Result Value Ref Range Status   SARS Coronavirus 2 by RT PCR NEGATIVE NEGATIVE Final    Comment: (NOTE) SARS-CoV-2 target nucleic acids are NOT DETECTED.  The SARS-CoV-2 RNA is generally detectable in upper respiratory specimens during the acute phase of infection. The lowest concentration of SARS-CoV-2 viral copies this assay can detect is 138 copies/mL. A negative result does not preclude SARS-Cov-2 infection and should not be used as the sole basis for treatment or other patient management decisions. A negative result  may occur with  improper specimen collection/handling, submission of specimen other than nasopharyngeal swab, presence of viral mutation(s) within the areas targeted by this assay, and inadequate number of viral copies(<138 copies/mL). A negative result must be combined with clinical observations, patient history, and epidemiological information. The expected result is Negative.  Fact Sheet for Patients:  bloggercourse.com  Fact Sheet for Healthcare Providers:  seriousbroker.it  This test is no t yet approved or cleared by the [place]  FDA and  has been authorized for detection and/or diagnosis of SARS-CoV-2 by FDA under an Emergency Use Authorization (EUA). This EUA will remain  in effect (meaning this test can be used) for the duration of the COVID-19 declaration  under Section 564(b)(1) of the Act, 21 U.S.C.section 360bbb-3(b)(1), unless the authorization is terminated  or revoked sooner.       Influenza A by PCR NEGATIVE NEGATIVE Final   Influenza B by PCR NEGATIVE NEGATIVE Final    Comment: (NOTE) The Xpert Xpress SARS-CoV-2/FLU/RSV plus assay is intended as an aid in the diagnosis of influenza from Nasopharyngeal swab specimens and should not be used as a sole basis for treatment. Nasal washings and aspirates are unacceptable for Xpert Xpress SARS-CoV-2/FLU/RSV testing.  Fact Sheet for Patients: bloggercourse.com  Fact Sheet for Healthcare Providers: seriousbroker.it  This test is not yet approved or cleared by the [place]  FDA and has been authorized for detection and/or diagnosis of SARS-CoV-2 by FDA under an Emergency Use Authorization (EUA). This EUA will remain in effect (meaning this test can be used) for the duration of the COVID-19 declaration under Section 564(b)(1) of the Act, 21 U.S.C. section 360bbb-3(b)(1), unless the authorization is terminated or revoked.  Performed at [facility], [address], [place], [place] [address]     Labs: CBC: Recent Labs  Lab 01/11/24 2132  WBC 7.4  HGB 13.7  HCT 41.4  MCV 82.1  PLT 382   Basic Metabolic Panel: Recent Labs  Lab 01/11/24 2132 01/12/24 0914 01/13/24 0920  NA 142 139 139  K 3.4* 3.4* 4.4  CL 101 100 102  CO2 26 30 29   GLUCOSE 53* 97 146*  BUN 10 8 18   CREATININE 0.72 0.74 0.85  CALCIUM  9.9 9.5 9.9  MG  --  2.3  --    Liver Function Tests: No results for input(s): AST, ALT, ALKPHOS, BILITOT, PROT, ALBUMIN in the last 168 hours. CBG: Recent Labs  Lab 01/12/24 1159 01/12/24 1635 01/12/24 2041 01/13/24 0744 01/13/24 1107  GLUCAP 145* 106* 108* 135* 128*    Discharge time spent: greater than 30 minutes.  Signed: Delon Herald, MD [facility] 01/13/2024

## 2024-01-12 NOTE — Hospital Course (Signed)
 68yo with h/o T2DM, HTN, HLD, CVA, and asthma who presented on 12/5 with hypoglycemia.  She has been treated with Mounjaro , Jardiance , and glipizide  and has lost from 240 to 171 pounds.  Hypoglycemia is likely related to glipizide  and this medication needs to be stopped.

## 2024-01-12 NOTE — Plan of Care (Signed)
  Problem: Education: Goal: Knowledge of General Education information will improve Description: Including pain rating scale, medication(s)/side effects and non-pharmacologic comfort measures Outcome: Progressing   Problem: Health Behavior/Discharge Planning: Goal: Ability to manage health-related needs will improve Outcome: Progressing   Problem: Clinical Measurements: Goal: Ability to maintain clinical measurements within normal limits will improve Outcome: Progressing Goal: Will remain free from infection Outcome: Progressing Goal: Diagnostic test results will improve Outcome: Progressing Goal: Respiratory complications will improve Outcome: Progressing Goal: Cardiovascular complication will be avoided Outcome: Progressing   Problem: Activity: Goal: Risk for activity intolerance will decrease Outcome: Progressing   Problem: Nutrition: Goal: Adequate nutrition will be maintained Outcome: Progressing   Problem: Coping: Goal: Level of anxiety will decrease Outcome: Progressing   Problem: Elimination: Goal: Will not experience complications related to bowel motility Outcome: Progressing Goal: Will not experience complications related to urinary retention Outcome: Progressing   Problem: Pain Managment: Goal: General experience of comfort will improve and/or be controlled Outcome: Progressing   Problem: Safety: Goal: Ability to remain free from injury will improve Outcome: Progressing   Problem: Skin Integrity: Goal: Risk for impaired skin integrity will decrease Outcome: Progressing   Problem: Education: Goal: Ability to describe self-care measures that may prevent or decrease complications (Diabetes Survival Skills Education) will improve Outcome: Progressing Goal: Individualized Educational Video(s) Outcome: Progressing   Problem: Coping: Goal: Ability to adjust to condition or change in health will improve Outcome: Progressing   Problem: Fluid  Volume: Goal: Ability to maintain a balanced intake and output will improve Outcome: Progressing   Problem: Health Behavior/Discharge Planning: Goal: Ability to identify and utilize available resources and services will improve Outcome: Progressing Goal: Ability to manage health-related needs will improve Outcome: Progressing   Problem: Metabolic: Goal: Ability to maintain appropriate glucose levels will improve Outcome: Progressing   Problem: Nutritional: Goal: Maintenance of adequate nutrition will improve Outcome: Progressing Goal: Progress toward achieving an optimal weight will improve Outcome: Progressing   Problem: Skin Integrity: Goal: Risk for impaired skin integrity will decrease Outcome: Progressing   Problem: Tissue Perfusion: Goal: Adequacy of tissue perfusion will improve Outcome: Progressing   Problem: Education: Goal: Individualized Educational Video(s) Outcome: Progressing   Problem: Fluid Volume: Goal: Ability to maintain a balanced intake and output will improve Outcome: Progressing

## 2024-01-12 NOTE — Assessment & Plan Note (Addendum)
 Patient was on glipizide , jardiance  and mounjaro  Improved, suspect that hypoglycemia was caused by glipizide  as this is a known risk Stop glipizide  No longer taking Mounjaro  OK to resume Jardiance  Monitor blood sugars at home for the next 24-48 hours Feels better and wants to go home Continue gabapentin 

## 2024-01-12 NOTE — Care Management Obs Status (Signed)
 MEDICARE OBSERVATION STATUS NOTIFICATION   Patient Details  Name: Megan Lane MRN: [id number] Date of Birth: 1955/08/12   Medicare Observation Status Notification Given:  Yes    Sonda Manuella Quill, RN 01/12/2024, 2:08 PM

## 2024-01-12 NOTE — Assessment & Plan Note (Addendum)
 Continue duloxetine  and methocarbamol 

## 2024-01-12 NOTE — TOC Initial Note (Signed)
 Transition of Care [facility]) - Initial/Assessment Note    Patient Details  Name: Megan Lane MRN: [id number] Date of Birth: 1956-01-19  [facility] of [facility] CM/SW Contact:    Sonda Manuella Quill, RN Phone Number: 01/12/2024, 5:28 PM  Clinical Narrative:                 Plans to return home w/ family support  Expected Discharge Plan: Home/Self Care Barriers to Discharge: Continued Medical Work up   Patient Goals and CMS Choice Patient states their goals for this hospitalization and ongoing recovery are:: home     [place] ownership interest in [facility].provided to:: Patient    Expected Discharge Plan and Services       Living arrangements for the past 2 months: Single Family Home                 DME Arranged: N/A DME Agency: NA       HH Arranged: NA HH Agency: NA        Prior Living Arrangements/Services Living arrangements for the past 2 months: Single Family Home Lives with:: Self Patient language and need for interpreter reviewed:: Yes Do you feel safe going back to the place where you live?: Yes      Need for Family Participation in Patient Care: Yes (Comment) Care giver support system in place?: Yes (comment) Current home services:  (n/a) Criminal Activity/Legal Involvement Pertinent to Current Situation/Hospitalization: No - Comment as needed  Activities of Daily Living   ADL Screening (condition at time of admission) Independently performs ADLs?: Yes (appropriate for developmental age) Is the patient deaf or have difficulty hearing?: No Does the patient have difficulty seeing, even when wearing glasses/contacts?: No Does the patient have difficulty concentrating, remembering, or making decisions?: No  Permission Sought/Granted Permission sought to share information with : Case Manager Permission granted to share information with : Yes, Verbal Permission Granted  Share Information with NAME: Case Manager     Permission  granted to share info w Relationship: Katrina Curtis-McClure (dtr) [phone]     Emotional Assessment Appearance:: Appears stated age Attitude/Demeanor/Rapport: Gracious   Orientation: : Oriented to Self, Oriented to Place, Oriented to  Time Alcohol / Substance Use: Not Applicable Psych Involvement: No (comment)  Admission diagnosis:  Dizziness [R42] Hypoglycemia [E16.2] Hypoglycemia associated with diabetes (HCC) [E11.649] Patient Active Problem List   Diagnosis Date Noted   Hypoglycemia associated with diabetes (HCC) 01/12/2024   Hypoglycemia 01/12/2024   Acute pain of left shoulder 05/31/2023   Primary osteoarthritis involving multiple joints 04/11/2023   Left sided sciatica 12/07/2022   Chronic right shoulder pain 08/21/2022   Hypertension associated with diabetes (HCC) 12/07/2021   History of stroke 12/07/2021   Diabetic polyneuropathy associated with type 2 diabetes mellitus (HCC) 12/07/2021   Trochlear nerve disease, 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   HLD (hyperlipidemia) 07/08/2016   Dyslipidemia associated with type 2 diabetes mellitus (HCC) 10/16/2013   Essential hypertension, benign 04/30/2013   PCP:  Purcell Emil Schanz, MD Pharmacy:   [place] [facility] [address] [place] [address] Phone: [phone] Fax: [phone]  [facility] [address] [place]), [place] - [address] [place] [address] Phone: [phone] Fax: [phone]     Social Drivers of Health (SDOH) Social History: SDOH Screenings   Food Insecurity: No Food Insecurity (01/12/2024)  Housing: Low Risk  (01/12/2024)  Transportation Needs: No Transportation Needs (01/12/2024)  Utilities:  Not At Risk (01/12/2024)  Alcohol Screen: Low Risk  (06/22/2023)  Depression (PHQ2-9): Low Risk  (06/22/2023)  Financial Resource Strain: Low Risk  (06/22/2023)  Physical Activity: Unknown (06/22/2023)  Social Connections:  Moderately Integrated (01/12/2024)  Stress: No Stress Concern Present (06/22/2023)  Tobacco Use: Medium Risk (09/04/2023)  Health Literacy: Adequate Health Literacy (06/22/2023)   SDOH Interventions: Food Insecurity Interventions: Intervention Not Indicated, Inpatient TOC Housing Interventions: Intervention Not Indicated, Inpatient TOC Transportation Interventions: Inpatient TOC Utilities Interventions: Intervention Not Indicated, Inpatient TOC   Readmission Risk Interventions     No data to display

## 2024-01-12 NOTE — Progress Notes (Incomplete)
 Plan of Care Note for accepted transfer   Patient: Megan Lane MRN: [id number]   DOA: 01/11/2024  Facility requesting transfer: [facility]   Requesting Provider: Dr. Tonia   Reason for transfer: *** Facility course: ***  Plan of care: The patient is accepted for admission to {Level_of_Care:26774} unit, at {Campus_List:26775}..  ***  Author: Evalene GORMAN Sprinkles, MD 01/12/2024  Check [email or web address] for on-call coverage.  Nursing staff, Please call [facility] Admits & Consults System-Wide number on Amion as soon as patient's arrival, so appropriate admitting provider can evaluate the pt.

## 2024-01-12 NOTE — Assessment & Plan Note (Signed)
 Continue rosuvstatin.

## 2024-01-12 NOTE — Assessment & Plan Note (Signed)
 Patient was on glipizide , jardiance  and mounjaro  Improved, suspect that hypoglycemia was caused by glipizide  as this is a known risk Stop glipizide  No longer taking Mounjaro  OK to resume Jardiance  Monitor blood sugars at home for the next 24-48 hours Feels better and wants to go home Continue gabapentin 

## 2024-01-12 NOTE — Assessment & Plan Note (Signed)
 Blood pressure appears to be borderline at this time  Metoprolol  100 mg daily,  but this is short-acting medication and is indicated BID; resume tomorrow at 50 mg BID Was on hydrochlorothiazide  25 mg daily; hold until follow up

## 2024-01-12 NOTE — Assessment & Plan Note (Signed)
 Continue methocarbamol , duloxetine 

## 2024-01-13 ENCOUNTER — Other Ambulatory Visit (HOSPITAL_COMMUNITY): Payer: Self-pay

## 2024-01-13 LAB — BASIC METABOLIC PANEL WITH GFR
Anion gap: 8 (ref 5–15)
BUN: 18 mg/dL (ref 8–23)
CO2: 29 mmol/L (ref 22–32)
Calcium: 9.9 mg/dL (ref 8.9–10.3)
Chloride: 102 mmol/L (ref 98–111)
Creatinine, Ser: 0.85 mg/dL (ref 0.44–1.00)
GFR, Estimated: >60 mL/min (ref >60)
Glucose, Bld: 146 mg/dL — ABNORMAL HIGH (ref 70–99)
Potassium: 4.4 mmol/L (ref 3.5–5.1)
Sodium: 139 mmol/L (ref 135–145)

## 2024-01-13 LAB — GLUCOSE, CAPILLARY
Glucose-Capillary: 135 mg/dL — ABNORMAL HIGH (ref 70–99)
Glucose-Capillary: 128 mg/dL — ABNORMAL HIGH (ref 70–99)

## 2024-01-13 MED ORDER — ACETAMINOPHEN 325 MG PO TABS: 650.0000 mg | ORAL_TABLET | Freq: Four times a day (QID) | ORAL | Status: AC | PRN

## 2024-01-13 MED ORDER — METOPROLOL TARTRATE 50 MG PO TABS
50.0000 mg | ORAL_TABLET | Freq: Two times a day (BID) | ORAL | 0 refills | Status: AC
  Filled 2024-01-13: qty 60, 30d supply, fill #0

## 2024-01-13 NOTE — Assessment & Plan Note (Signed)
 Continue ASA

## 2024-01-13 NOTE — Plan of Care (Signed)
  Problem: Health Behavior/Discharge Planning: Goal: Ability to manage health-related needs will improve Outcome: Progressing   Problem: Clinical Measurements: Goal: Ability to maintain clinical measurements within normal limits will improve Outcome: Progressing Goal: Diagnostic test results will improve Outcome: Progressing   Problem: Nutrition: Goal: Adequate nutrition will be maintained Outcome: Progressing   

## 2024-01-14 ENCOUNTER — Ambulatory Visit: Admitting: Emergency Medicine

## 2024-01-14 ENCOUNTER — Telehealth: Payer: Self-pay

## 2024-01-14 NOTE — Telephone Encounter (Signed)
 FYI pt is being coming in today

## 2024-01-14 NOTE — Transitions of Care (Post Inpatient/ED Visit) (Unsigned)
   01/14/2024  Name: Megan Lane MRN: [id number] DOB: 1955/02/16  Today's TOC FU Call Status: Today's TOC FU Call Status:: Unsuccessful Call (1st Attempt) Unsuccessful Call (1st Attempt) Date: 01/14/24  Attempted to reach the patient regarding the most recent Inpatient/ED visit.  Follow Up Plan: Additional outreach attempts will be made to reach the patient to complete the Transitions of Care (Post Inpatient/ED visit) call.   Signature Julian Lemmings, LPN [facility] Nurse Health Advisor Direct Dial [phone]

## 2024-01-14 NOTE — Telephone Encounter (Signed)
 Okay, thanks

## 2024-01-15 ENCOUNTER — Telehealth: Payer: Self-pay

## 2024-01-15 NOTE — Transitions of Care (Post Inpatient/ED Visit) (Signed)
 01/15/2024  Name: Megan Lane MRN: [id number] DOB: 28-Apr-1955  Today's TOC FU Call Status: Today's TOC FU Call Status:: Successful TOC FU Call Completed Unsuccessful Call (1st Attempt) Date: 01/14/24 [facility] FU Call Complete Date: 01/15/24  Patient's Name and Date of Birth confirmed. Name, DOB  Transition Care Management Follow-up Telephone Call Date of Discharge: 01/13/24 Discharge Facility: [facility]) Type of Discharge: Inpatient Admission Primary Inpatient Discharge Diagnosis:: hypoglycemia How have you been since you were released from the hospital?: Better Any questions or concerns?: No  Items Reviewed: Did you receive and understand the discharge instructions provided?: Yes Medications obtained,verified, and reconciled?: Yes (Medications Reviewed) Any new allergies since your discharge?: No Dietary orders reviewed?: Yes Do you have support at home?: Yes People in Home [RPT]: other relative(s)  Medications Reviewed Today: Medications Reviewed Today     Reviewed by Emmitt Pan, LPN (Licensed Practical Nurse) on 01/15/24 at 1426  Med List Status: <None>   Medication Order Taking? Sig Documenting Provider Last Dose Status Informant  acetaminophen  (TYLENOL ) 325 MG tablet [id number] Yes Take 2 tablets (650 mg total) by mouth every 6 (six) hours as needed for mild pain (pain score 1-3) or fever (or Fever >/= 101). Barbarann Nest, MD  Active   aspirin  EC 81 MG tablet [id number] Yes Take 1 tablet (81 mg total) by mouth daily. Swallow whole. Newlin, Enobong, MD  Active Self, Pharmacy Records  DULoxetine  (CYMBALTA ) 60 MG capsule [id number] Yes Take 1 capsule (60 mg total) by mouth daily. Sagardia, Miguel Jose, MD  Active Self, Pharmacy Records  empagliflozin  (JARDIANCE ) 25 MG TABS tablet [id number] Yes Take 1 tablet (25 mg total) by mouth daily before breakfast. Purcell Emil Schanz, MD  Active Self, Pharmacy Records  gabapentin  (NEURONTIN ) 300 MG capsule [id number] Yes  Take 2 capsules (600 mg total) by mouth 3 (three) times daily.  Patient taking differently: Take 300 mg by mouth 2 (two) times daily.   Purcell Emil Schanz, MD  Active Self, Pharmacy Records           Med Note LORNE, [place] C   Sat Jan 12, 2024  9:52 AM) Pt verified this is how she is taking this medication.   glucose blood (CONTOUR NEXT TEST) test strip [id number] Yes Use as instructed Newlin, Enobong, MD  Active Self, Pharmacy Records  hydrochlorothiazide  (HYDRODIURIL ) 25 MG tablet [id number]  Take 1 tablet (25 mg total) by mouth daily.  Patient not taking: Reported on 01/15/2024   Purcell Emil Schanz, MD  Active Self, Pharmacy Records  meloxicam  (MOBIC ) 15 MG tablet [id number] Yes Take 1 tablet (15 mg total) by mouth daily.  Patient taking differently: Take 15 mg by mouth daily as needed for pain.   Purcell Emil Schanz, MD  Active Self, Pharmacy Records           Med Note LORNE, [place] C   Sat Jan 12, 2024  9:52 AM) Pt verified this is how she is taking this medication.   methocarbamol  (ROBAXIN ) 750 MG tablet [id number] Yes Take 1 tablet (750 mg total) by mouth 2 (two) times daily as needed for muscle spasms. Jule Ronal CROME, PA-C  Active Self, Pharmacy Records  metoprolol  tartrate (LOPRESSOR ) 50 MG tablet [id number] Yes Take 1 tablet (50 mg total) by mouth 2 (two) times daily. Barbarann Nest, MD  Active   rosuvastatin  (CRESTOR ) 20 MG tablet [id number] Yes Take 1 tablet (20 mg total) by mouth daily. Purcell Emil Schanz, MD  Active Self, Pharmacy Records  Home Care and Equipment/Supplies: Were Home Health Services Ordered?: NA Any new equipment or medical supplies ordered?: NA  Functional Questionnaire: Do you need assistance with bathing/showering or dressing?: No Do you need assistance with meal preparation?: No Do you need assistance with eating?: No Do you have difficulty maintaining continence: No Do you need assistance with getting out of bed/getting out of a  chair/moving?: No Do you have difficulty managing or taking your medications?: No  Follow up appointments reviewed: PCP Follow-up appointment confirmed?: Yes Date of PCP follow-up appointment?: 01/16/24 Follow-up Provider: [facility] Follow-up appointment confirmed?: NA Do you need transportation to your follow-up appointment?: No Do you understand care options if your condition(s) worsen?: Yes-patient verbalized understanding    SIGNATURE Julian Lemmings, LPN [facility] Nurse Health Advisor Direct Dial [phone]

## 2024-01-15 NOTE — Telephone Encounter (Signed)
 Copied from CRM #[id number]. Topic: General - Other >> Jan 14, 2024  4:56 PM Dedra B wrote: Reason for CRM: Pt returning call for [facility] nurse Lanette Hamilton.

## 2024-01-16 ENCOUNTER — Encounter: Payer: Self-pay | Admitting: Emergency Medicine

## 2024-01-16 ENCOUNTER — Ambulatory Visit: Admitting: Emergency Medicine

## 2024-01-16 VITALS — BP 112/80 | HR 67 | Temp 98.2°F | Ht 73.0 in | Wt 178.0 lb

## 2024-01-16 DIAGNOSIS — Z09 Encounter for follow-up examination after completed treatment for conditions other than malignant neoplasm: Secondary | ICD-10-CM

## 2024-01-16 DIAGNOSIS — E1169 Type 2 diabetes mellitus with other specified complication: Secondary | ICD-10-CM | POA: Diagnosis not present

## 2024-01-16 DIAGNOSIS — I152 Hypertension secondary to endocrine disorders: Secondary | ICD-10-CM | POA: Diagnosis not present

## 2024-01-16 DIAGNOSIS — Z7985 Long-term (current) use of injectable non-insulin antidiabetic drugs: Secondary | ICD-10-CM | POA: Diagnosis not present

## 2024-01-16 DIAGNOSIS — E1159 Type 2 diabetes mellitus with other circulatory complications: Secondary | ICD-10-CM | POA: Diagnosis not present

## 2024-01-16 DIAGNOSIS — E785 Hyperlipidemia, unspecified: Secondary | ICD-10-CM | POA: Diagnosis not present

## 2024-01-16 DIAGNOSIS — Z8673 Personal history of transient ischemic attack (TIA), and cerebral infarction without residual deficits: Secondary | ICD-10-CM | POA: Diagnosis not present

## 2024-01-16 LAB — POCT GLYCOSYLATED HEMOGLOBIN (HGB A1C): HbA1c POC (<> result, manual entry): 5.7 % (ref 4.0–5.6)

## 2024-01-16 NOTE — Assessment & Plan Note (Signed)
 Chronic stable conditions Well-controlled diabetes with hemoglobin A1c of 5.8 Continue glipizide , Jardiance , and weekly Mounjaro  Continue rosuvastatin  20 mg daily Diet and nutrition discussed

## 2024-01-16 NOTE — Patient Instructions (Signed)
 Health Maintenance After Age 67 After age 27, you are at a higher risk for certain long-term diseases and infections as well as injuries from falls. Falls are a major cause of broken bones and head injuries in people who are older than age 73. Getting regular preventive care can help to keep you healthy and well. Preventive care includes getting regular testing and making lifestyle changes as recommended by your health care provider. Talk with your health care provider about: Which screenings and tests you should have. A screening is a test that checks for a disease when you have no symptoms. A diet and exercise plan that is right for you. What should I know about screenings and tests to prevent falls? Screening and testing are the best ways to find a health problem early. Early diagnosis and treatment give you the best chance of managing medical conditions that are common after age 90. Certain conditions and lifestyle choices may make you more likely to have a fall. Your health care provider may recommend: Regular vision checks. Poor vision and conditions such as cataracts can make you more likely to have a fall. If you wear glasses, make sure to get your prescription updated if your vision changes. Medicine review. Work with your health care provider to regularly review all of the medicines you are taking, including over-the-counter medicines. Ask your health care provider about any side effects that may make you more likely to have a fall. Tell your health care provider if any medicines that you take make you feel dizzy or sleepy. Strength and balance checks. Your health care provider may recommend certain tests to check your strength and balance while standing, walking, or changing positions. Foot health exam. Foot pain and numbness, as well as not wearing proper footwear, can make you more likely to have a fall. Screenings, including: Osteoporosis screening. Osteoporosis is a condition that causes  the bones to get weaker and break more easily. Blood pressure screening. Blood pressure changes and medicines to control blood pressure can make you feel dizzy. Depression screening. You may be more likely to have a fall if you have a fear of falling, feel depressed, or feel unable to do activities that you used to do. Alcohol  use screening. Using too much alcohol  can affect your balance and may make you more likely to have a fall. Follow these instructions at home: Lifestyle Do not drink alcohol  if: Your health care provider tells you not to drink. If you drink alcohol : Limit how much you have to: 0-1 drink a day for women. 0-2 drinks a day for men. Know how much alcohol  is in your drink. In the [place] one drink equals one 12 oz bottle of beer (355 mL), one 5 oz glass of wine (148 mL), or one 1 oz glass of hard liquor (44 mL). Do not use any products that contain nicotine or tobacco. These products include cigarettes, chewing tobacco, and vaping devices, such as e-cigarettes. If you need help quitting, ask your health care provider. Activity  Follow a regular exercise program to stay fit. This will help you maintain your balance. Ask your health care provider what types of exercise are appropriate for you. If you need a cane or walker, use it as recommended by your health care provider. Wear supportive shoes that have nonskid soles. Safety  Remove any tripping hazards, such as rugs, cords, and clutter. Install safety equipment such as grab bars in bathrooms and safety rails on stairs. Keep rooms and walkways  well-lit. General instructions Talk with your health care provider about your risks for falling. Tell your health care provider if: You fall. Be sure to tell your health care provider about all falls, even ones that seem minor. You feel dizzy, tiredness (fatigue), or off-balance. Take over-the-counter and prescription medicines only as told by your health care provider. These include  supplements. Eat a healthy diet and maintain a healthy weight. A healthy diet includes low-fat dairy products, low-fat (lean) meats, and fiber from whole grains, beans, and lots of fruits and vegetables. Stay current with your vaccines. Schedule regular health, dental, and eye exams. Summary Having a healthy lifestyle and getting preventive care can help to protect your health and wellness after age 15. Screening and testing are the best way to find a health problem early and help you avoid having a fall. Early diagnosis and treatment give you the best chance for managing medical conditions that are more common for people who are older than age 42. Falls are a major cause of broken bones and head injuries in people who are older than age 64. Take precautions to prevent a fall at home. Work with your health care provider to learn what changes you can make to improve your health and wellness and to prevent falls. This information is not intended to replace advice given to you by your health care provider. Make sure you discuss any questions you have with your health care provider. Document Revised: 06/14/2020 Document Reviewed: 06/14/2020 Elsevier Patient Education  2024 [employer].

## 2024-01-16 NOTE — Assessment & Plan Note (Signed)
 BP Readings from Last 3 Encounters:  01/16/24 112/80  01/13/24 130/88  08/28/23 124/80  Well-controlled hypertension Just restarted hydrochlorothiazide  25 mg daily Also taking metoprolol  tartrate 50 mg twice a day Hemoglobin A1c at goal Well-controlled diabetes Continues Mounjaro  and Jardiance  25 mg daily

## 2024-01-16 NOTE — Progress Notes (Signed)
 Megan Lane 68 y.o.   Chief Complaint  Patient presents with   Follow-up    Pt just got out of the hospital due to her sugars being in the 50's    HISTORY OF PRESENT ILLNESS: This is a 68 y.o. female here for hospital discharge follow-up Admitted on 01/11/2024 with hypoglycemia and released in stable condition on 01/13/2024. Had been taking Mounjaro , glipizide , and Jardiance .  Glipizide  was discontinued.  No longer taking it. Doing better.  Has no complaints or medical concerns today. Lab Results  Component Value Date   HGBA1C 5.8 08/28/2023   Wt Readings from Last 3 Encounters:  01/16/24 178 lb (80.7 kg)  01/13/24 176 lb 2.4 oz (79.9 kg)  08/28/23 181 lb (82.1 kg)     Expand All Collapse All     Physician Discharge Summary    Patient: Megan Lane MRN: [id number] DOB: October 18, 1955  Admit date:     01/11/2024  Discharge date: 01/13/2024  Discharge Physician: Delon Herald    PCP: Purcell Emil Schanz, MD    Recommendations at discharge:    Stop glipizide  Monitor glucose every 4 hours for the next 24-48 hours and eat regularly Start metoprolol  back tomorrow at 50 mg twice daily Hold hydrochlorothiazide  for now Follow up with Dr. Delynn next week   Discharge Diagnoses: Principal Problem:   Hypoglycemia associated with diabetes (HCC) Active Problems:   HLD (hyperlipidemia)   Hypertension associated with diabetes (HCC)   History of stroke   Chronic right shoulder pain       Hospital Course: 68yo with h/o T2DM, HTN, HLD, CVA, and asthma who presented on 12/5 with hypoglycemia.  She has been treated with Mounjaro , Jardiance , and glipizide  and has lost from 240 to 171 pounds.  Hypoglycemia is likely related to glipizide  and this medication needs to be stopped.   Assessment and Plan:   Assessment & Plan Hypoglycemia associated with diabetes (HCC) Patient was on glipizide , jardiance  and mounjaro  Improved, suspect that hypoglycemia was caused by glipizide   as this is a known risk Stop glipizide  No longer taking Mounjaro  OK to resume Jardiance  Monitor blood sugars at home for the next 24-48 hours Feels better and wants to go home Continue gabapentin  Hypertension associated with diabetes (HCC) Blood pressure appears to be borderline at this time  Metoprolol  100 mg daily,  but this is short-acting medication and is indicated BID; resume tomorrow at 50 mg BID Was on hydrochlorothiazide  25 mg daily; hold until follow up HLD (hyperlipidemia) Continue rosuvstatin History of stroke Continue ASA Chronic right shoulder pain Continue methocarbamol , duloxetine         HPI   Prior to Admission medications   Medication Sig Start Date End Date Taking? Authorizing Provider  acetaminophen  (TYLENOL ) 325 MG tablet Take 2 tablets (650 mg total) by mouth every 6 (six) hours as needed for mild pain (pain score 1-3) or fever (or Fever >/= 101). 01/13/24  Yes Herald Delon, MD  aspirin  EC 81 MG tablet Take 1 tablet (81 mg total) by mouth daily. Swallow whole. 11/01/20  Yes Newlin, Enobong, MD  DULoxetine  (CYMBALTA ) 60 MG capsule Take 1 capsule (60 mg total) by mouth daily. 12/14/23  Yes Wilna Pennie, Emil Schanz, MD  empagliflozin  (JARDIANCE ) 25 MG TABS tablet Take 1 tablet (25 mg total) by mouth daily before breakfast. 11/05/23  Yes Norma Ignasiak, Emil Schanz, MD  gabapentin  (NEURONTIN ) 300 MG capsule Take 2 capsules (600 mg total) by mouth 3 (three) times daily. 02/21/23 02/16/24 Yes Jazz Rogala, Loretto,  MD  methocarbamol  (ROBAXIN ) 750 MG tablet Take 1 tablet (750 mg total) by mouth 2 (two) times daily as needed for muscle spasms. 10/30/23  Yes Jule Ronal CROME, PA-C  metoprolol  tartrate (LOPRESSOR ) 50 MG tablet Take 1 tablet (50 mg total) by mouth 2 (two) times daily. 01/13/24  Yes Barbarann Nest, MD  rosuvastatin  (CRESTOR ) 20 MG tablet Take 1 tablet (20 mg total) by mouth daily. 11/02/23  Yes Nalina Yeatman, Emil Schanz, MD  glucose blood (CONTOUR NEXT TEST) test strip Use  as instructed 09/16/21   Newlin, Enobong, MD  hydrochlorothiazide  (HYDRODIURIL ) 25 MG tablet Take 1 tablet (25 mg total) by mouth daily. Patient not taking: Reported on 01/15/2024 11/02/23   Purcell Emil Schanz, MD  meloxicam  (MOBIC ) 15 MG tablet Take 1 tablet (15 mg total) by mouth daily. Patient taking differently: Take 15 mg by mouth daily as needed for pain. 11/02/23   Purcell Emil Schanz, MD    Allergies  Allergen Reactions   Penicillins Rash    Patient Active Problem List   Diagnosis Date Noted   Hypoglycemia associated with diabetes (HCC) 01/12/2024   Hypoglycemia 01/12/2024   Acute pain of left shoulder 05/31/2023   Primary osteoarthritis involving multiple joints 04/11/2023   Left sided sciatica 12/07/2022   Chronic right shoulder pain 08/21/2022   Hypertension associated with diabetes (HCC) 12/07/2021   History of stroke 12/07/2021   Diabetic polyneuropathy associated with type 2 diabetes mellitus (HCC) 12/07/2021   Trochlear nerve disease, 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   HLD (hyperlipidemia) 07/08/2016   Dyslipidemia associated with type 2 diabetes mellitus (HCC) 10/16/2013   Essential hypertension, benign 04/30/2013    Past Medical History:  Diagnosis Date   Asthma    as a child   Diabetes mellitus    Hypercholesterolemia    Hypertension    Stroke (HCC) 04/2021   TIA   Ventral hernia    Wears glasses     Past Surgical History:  Procedure Laterality Date   ABDOMINAL HYSTERECTOMY     COLONOSCOPY  2012   normal , at [facility]   KNEE SURGERY     LEG SURGERY     left lower extremity   TUBAL LIGATION     VENTRAL HERNIA REPAIR N/A 05/29/2019   Procedure: REPAIR OF VENTRAL HERNIA WITH MESH;  Surgeon: Vanderbilt Ned, MD;  Location: [facility] OR;  Service: General;  Laterality: N/A;    Social History   Socioeconomic History   Marital status: Single    Spouse name: Not on file   Number of children: 2   Years of education: Not on file   Highest education level: Not on file   Occupational History   Occupation: Part time (04/13/20)  Tobacco Use   Smoking status: Former    Current packs/day: 0.00    Average packs/day: 3.0 packs/day for 35.0 years (105.0 ttl pk-yrs)    Types: Cigarettes    Start date: 08/12/1970    Quit date: 08/11/2005    Years since quitting: 18.4   Smokeless tobacco: Never   Tobacco comments:    Age 6 start  Vaping Use   Vaping status: Never Used  Substance and Sexual Activity   Alcohol use: Never   Drug use: Never   Sexual activity: Not Currently    Birth control/protection: Post-menopausal, Surgical  Other Topics Concern   Not on file  Social History Narrative   Niece lives with her   Right Handed   Drinks rarely caffeine   Social Drivers of Health  Financial Resource Strain: Low Risk  (06/22/2023)   Overall Financial Resource Strain (CARDIA)    Difficulty of Paying Living Expenses: Not very hard  Food Insecurity: No Food Insecurity (01/12/2024)   Hunger Vital Sign    Worried About Running Out of Food in the Last Year: Never true    Ran Out of Food in the Last Year: Never true  Transportation Needs: No Transportation Needs (01/12/2024)   PRAPARE - Administrator, Civil Service (Medical): No    Lack of Transportation (Non-Medical): No  Physical Activity: Unknown (06/22/2023)   Exercise Vital Sign    Days of Exercise per Week: 3 days    Minutes of Exercise per Session: Not on file  Stress: No Stress Concern Present (06/22/2023)   [employer] of Occupational Health - Occupational Stress Questionnaire    Feeling of Stress : Not at all  Social Connections: Moderately Integrated (01/12/2024)   Social Connection and Isolation Panel    Frequency of Communication with Friends and Family: More than three times a week    Frequency of Social Gatherings with Friends and Family: More than three times a week    Attends Religious Services: More than 4 times per year    Active Member of [employer] or Organizations: Yes    Attends  Banker Meetings: 1 to 4 times per year    Marital Status: Widowed  Intimate Partner Violence: Not At Risk (01/12/2024)   Humiliation, Afraid, Rape, and Kick questionnaire    Fear of Current or Ex-Partner: No    Emotionally Abused: No    Physically Abused: No    Sexually Abused: No    Family History  Problem Relation Age of Onset   Diabetes Mother    Hypertension Mother    Stroke Mother    Breast cancer Sister 73   Heart disease Brother    Colon cancer Neg Hx    Colon polyps Neg Hx    Esophageal cancer Neg Hx    Rectal cancer Neg Hx    Stomach cancer Neg Hx      Review of Systems  Constitutional: Negative.  Negative for chills and fever.  HENT: Negative.  Negative for congestion and sore throat.   Respiratory: Negative.  Negative for cough and shortness of breath.   Cardiovascular: Negative.  Negative for chest pain and palpitations.  Gastrointestinal:  Negative for abdominal pain, diarrhea, nausea and vomiting.  Genitourinary: Negative.  Negative for dysuria and hematuria.  Skin: Negative.  Negative for rash.  Neurological: Negative.  Negative for dizziness and headaches.  All other systems reviewed and are negative.   Vitals:   01/16/24 1429  BP: 112/80  Pulse: 67  Temp: 98.2 F (36.8 C)  SpO2: 97%    Physical Exam Vitals reviewed.  Constitutional:      Appearance: Normal appearance.  HENT:     Head: Normocephalic.     Mouth/Throat:     Mouth: Mucous membranes are moist.     Pharynx: Oropharynx is clear.  Eyes:     Extraocular Movements: Extraocular movements intact.     Pupils: Pupils are equal, round, and reactive to light.  Cardiovascular:     Rate and Rhythm: Normal rate and regular rhythm.     Pulses: Normal pulses.     Heart sounds: Normal heart sounds.  Pulmonary:     Effort: Pulmonary effort is normal.     Breath sounds: Normal breath sounds.  Musculoskeletal:     Cervical  back: No tenderness.  Lymphadenopathy:     Cervical: No  cervical adenopathy.  Skin:    General: Skin is warm and dry.     Capillary Refill: Capillary refill takes less than 2 seconds.  Neurological:     General: No focal deficit present.     Mental Status: She is alert and oriented to person, place, and time.  Psychiatric:        Mood and Affect: Mood normal.        Behavior: Behavior normal.    Results for orders placed or performed in visit on 01/16/24 (from the past 24 hours)  POCT HgB A1C     Status: Normal   Collection Time: 01/16/24  3:03 PM  Result Value Ref Range   Hemoglobin A1C     HbA1c POC (<> result, manual entry) 5.7 4.0 - 5.6 %   HbA1c, POC (prediabetic range)     HbA1c, POC (controlled diabetic range)       ASSESSMENT & PLAN: A total of 40 minutes was spent with the patient and counseling/coordination of care regarding preparing for this visit, review of most recent office visit notes, review of most recent hospital discharge summary, review of multiple chronic medical conditions and their management, review of all medications and changes made, review of most recent bloodwork results including interpretation of today's hemoglobin A1c, review of health maintenance items, education on nutrition, prognosis, documentation, and need for follow up.   Problem List Items Addressed This Visit       Cardiovascular and Mediastinum   Hypertension associated with diabetes (HCC) - Primary   BP Readings from Last 3 Encounters:  01/16/24 112/80  01/13/24 130/88  08/28/23 124/80  Well-controlled hypertension Just restarted hydrochlorothiazide  25 mg daily Also taking metoprolol  tartrate 50 mg twice a day Hemoglobin A1c at goal Well-controlled diabetes Continues Mounjaro  and Jardiance  25 mg daily         Endocrine   Dyslipidemia associated with type 2 diabetes mellitus (HCC)   Chronic stable conditions Well-controlled diabetes with hemoglobin A1c of 5.8 Continue glipizide , Jardiance , and weekly Mounjaro  Continue  rosuvastatin  20 mg daily Diet and nutrition discussed        Other   History of stroke   Secondary stroke prevention measures discussed Continue rosuvastatin  20 mg daily Well-controlled diabetes and hypertension Diet and nutrition discussed Continues daily baby aspirin       Other Visit Diagnoses       Hospital discharge follow-up          Patient Instructions  Health Maintenance After Age 33 After age 13, you are at a higher risk for certain long-term diseases and infections as well as injuries from falls. Falls are a major cause of broken bones and head injuries in people who are older than age 25. Getting regular preventive care can help to keep you healthy and well. Preventive care includes getting regular testing and making lifestyle changes as recommended by your health care provider. Talk with your health care provider about: Which screenings and tests you should have. A screening is a test that checks for a disease when you have no symptoms. A diet and exercise plan that is right for you. What should I know about screenings and tests to prevent falls? Screening and testing are the best ways to find a health problem early. Early diagnosis and treatment give you the best chance of managing medical conditions that are common after age 68. Certain conditions and lifestyle choices may make you  more likely to have a fall. Your health care provider may recommend: Regular vision checks. Poor vision and conditions such as cataracts can make you more likely to have a fall. If you wear glasses, make sure to get your prescription updated if your vision changes. Medicine review. Work with your health care provider to regularly review all of the medicines you are taking, including over-the-counter medicines. Ask your health care provider about any side effects that may make you more likely to have a fall. Tell your health care provider if any medicines that you take make you feel dizzy or  sleepy. Strength and balance checks. Your health care provider may recommend certain tests to check your strength and balance while standing, walking, or changing positions. Foot health exam. Foot pain and numbness, as well as not wearing proper footwear, can make you more likely to have a fall. Screenings, including: Osteoporosis screening. Osteoporosis is a condition that causes the bones to get weaker and break more easily. Blood pressure screening. Blood pressure changes and medicines to control blood pressure can make you feel dizzy. Depression screening. You may be more likely to have a fall if you have a fear of falling, feel depressed, or feel unable to do activities that you used to do. Alcohol use screening. Using too much alcohol can affect your balance and may make you more likely to have a fall. Follow these instructions at home: Lifestyle Do not drink alcohol if: Your health care provider tells you not to drink. If you drink alcohol: Limit how much you have to: 0-1 drink a day for women. 0-2 drinks a day for men. Know how much alcohol is in your drink. In the [place] one drink equals one 12 oz bottle of beer (355 mL), one 5 oz glass of wine (148 mL), or one 1 oz glass of hard liquor (44 mL). Do not use any products that contain nicotine or tobacco. These products include cigarettes, chewing tobacco, and vaping devices, such as e-cigarettes. If you need help quitting, ask your health care provider. Activity  Follow a regular exercise program to stay fit. This will help you maintain your balance. Ask your health care provider what types of exercise are appropriate for you. If you need a cane or walker, use it as recommended by your health care provider. Wear supportive shoes that have nonskid soles. Safety  Remove any tripping hazards, such as rugs, cords, and clutter. Install safety equipment such as grab bars in bathrooms and safety rails on stairs. Keep rooms and walkways  well-lit. General instructions Talk with your health care provider about your risks for falling. Tell your health care provider if: You fall. Be sure to tell your health care provider about all falls, even ones that seem minor. You feel dizzy, tiredness (fatigue), or off-balance. Take over-the-counter and prescription medicines only as told by your health care provider. These include supplements. Eat a healthy diet and maintain a healthy weight. A healthy diet includes low-fat dairy products, low-fat (lean) meats, and fiber from whole grains, beans, and lots of fruits and vegetables. Stay current with your vaccines. Schedule regular health, dental, and eye exams. Summary Having a healthy lifestyle and getting preventive care can help to protect your health and wellness after age 14. Screening and testing are the best way to find a health problem early and help you avoid having a fall. Early diagnosis and treatment give you the best chance for managing medical conditions that are more common for people  who are older than age 76. Falls are a major cause of broken bones and head injuries in people who are older than age 61. Take precautions to prevent a fall at home. Work with your health care provider to learn what changes you can make to improve your health and wellness and to prevent falls. This information is not intended to replace advice given to you by your health care provider. Make sure you discuss any questions you have with your health care provider. Document Revised: 06/14/2020 Document Reviewed: 06/14/2020 Elsevier Patient Education  2024 Elsevier Inc.    Emil Schaumann, MD [facility]

## 2024-01-16 NOTE — Assessment & Plan Note (Signed)
 Secondary stroke prevention measures discussed Continue rosuvastatin  20 mg daily Well-controlled diabetes and hypertension Diet and nutrition discussed Continues daily baby aspirin 

## 2024-02-13 ENCOUNTER — Other Ambulatory Visit (HOSPITAL_COMMUNITY): Payer: Self-pay

## 2024-02-13 ENCOUNTER — Other Ambulatory Visit: Payer: Self-pay | Admitting: Emergency Medicine

## 2024-02-13 DIAGNOSIS — I152 Hypertension secondary to endocrine disorders: Secondary | ICD-10-CM

## 2024-02-13 MED ORDER — MOUNJARO 10 MG/0.5ML ~~LOC~~ SOAJ
10.0000 mg | SUBCUTANEOUS | 3 refills | Status: AC
  Filled 2024-02-13 – 2024-03-10 (×2): qty 2, 28d supply, fill #0

## 2024-02-15 ENCOUNTER — Other Ambulatory Visit (HOSPITAL_COMMUNITY): Payer: Self-pay

## 2024-02-22 ENCOUNTER — Other Ambulatory Visit (HOSPITAL_COMMUNITY): Payer: Self-pay

## 2024-02-28 ENCOUNTER — Ambulatory Visit: Admitting: Emergency Medicine

## 2024-03-07 ENCOUNTER — Encounter: Payer: Self-pay | Admitting: *Deleted

## 2024-03-07 NOTE — Progress Notes (Signed)
 [name]                                          MRN: [id number]   03/07/2024   The [facility] Quality Team Specialist reviewed this patient medical record for the purposes of chart review for care gap closure. The following were reviewed: abstraction for care gap closure-kidney health evaluation for diabetes:eGFR  and uACR.    [facility] Quality Team

## 2024-03-07 NOTE — Progress Notes (Signed)
 Megan Lane                                          MRN: [id number]   03/07/2024   The [facility] Quality Team Specialist reviewed this patient medical record for the purposes of chart review for care gap closure. The following were reviewed: abstraction for care gap closure-glycemic status assessment.    [facility] Quality Team

## 2024-03-10 ENCOUNTER — Other Ambulatory Visit: Payer: Self-pay | Admitting: Emergency Medicine

## 2024-03-10 ENCOUNTER — Other Ambulatory Visit (HOSPITAL_COMMUNITY): Payer: Self-pay

## 2024-03-10 DIAGNOSIS — E1142 Type 2 diabetes mellitus with diabetic polyneuropathy: Secondary | ICD-10-CM

## 2024-03-10 MED ORDER — GABAPENTIN 300 MG PO CAPS
600.0000 mg | ORAL_CAPSULE | Freq: Three times a day (TID) | ORAL | 3 refills | Status: AC
  Filled 2024-03-10: qty 540, 90d supply, fill #0

## 2024-03-11 ENCOUNTER — Other Ambulatory Visit (HOSPITAL_COMMUNITY): Payer: Self-pay

## 2024-06-24 ENCOUNTER — Ambulatory Visit
